# Patient Record
Sex: Female | Born: 1950
Health system: Southern US, Community
[De-identification: ages and names within clinical notes are randomized; demographics above are authoritative.]

## PROBLEM LIST (undated history)

## (undated) DIAGNOSIS — I441 Atrioventricular block, second degree: Secondary | ICD-10-CM

## (undated) DIAGNOSIS — I712 Thoracic aortic aneurysm, without rupture: Secondary | ICD-10-CM

## (undated) DIAGNOSIS — Z91041 Radiographic dye allergy status: Secondary | ICD-10-CM

## (undated) DIAGNOSIS — I771 Stricture of artery: Secondary | ICD-10-CM

## (undated) DIAGNOSIS — I493 Ventricular premature depolarization: Secondary | ICD-10-CM

## (undated) DIAGNOSIS — I255 Ischemic cardiomyopathy: Secondary | ICD-10-CM

## (undated) DIAGNOSIS — I214 Non-ST elevation (NSTEMI) myocardial infarction: Secondary | ICD-10-CM

## (undated) DIAGNOSIS — I491 Atrial premature depolarization: Secondary | ICD-10-CM

## (undated) DIAGNOSIS — M069 Rheumatoid arthritis, unspecified: Secondary | ICD-10-CM

## (undated) DIAGNOSIS — I10 Essential (primary) hypertension: Secondary | ICD-10-CM

## (undated) DIAGNOSIS — I7121 Aneurysm of the ascending aorta, without rupture: Secondary | ICD-10-CM

## (undated) DIAGNOSIS — D869 Sarcoidosis, unspecified: Secondary | ICD-10-CM

## (undated) DIAGNOSIS — D649 Anemia, unspecified: Secondary | ICD-10-CM

## (undated) DIAGNOSIS — Z952 Presence of prosthetic heart valve: Secondary | ICD-10-CM

## (undated) DIAGNOSIS — E785 Hyperlipidemia, unspecified: Secondary | ICD-10-CM

## (undated) DIAGNOSIS — Z951 Presence of aortocoronary bypass graft: Secondary | ICD-10-CM

## (undated) DIAGNOSIS — I2510 Atherosclerotic heart disease of native coronary artery without angina pectoris: Secondary | ICD-10-CM

## (undated) HISTORY — DX: Presence of aortocoronary bypass graft: Z95.1

## (undated) HISTORY — DX: Hyperlipidemia, unspecified: E78.5

## (undated) HISTORY — DX: Sarcoidosis, unspecified: D86.9

## (undated) HISTORY — DX: Anemia, unspecified: D64.9

## (undated) HISTORY — DX: Non-ST elevation (NSTEMI) myocardial infarction: I21.4

## (undated) HISTORY — DX: Stricture of artery: I77.1

## (undated) HISTORY — DX: Rheumatoid arthritis, unspecified: M06.9

## (undated) HISTORY — DX: Presence of prosthetic heart valve: Z95.2

## (undated) HISTORY — DX: Essential (primary) hypertension: I10

---

## 1999-04-01 ENCOUNTER — Ambulatory Visit (HOSPITAL_COMMUNITY): Admission: RE | Admit: 1999-04-01 | Discharge: 1999-04-01 | Payer: Self-pay | Admitting: Family Medicine

## 1999-04-22 ENCOUNTER — Ambulatory Visit: Admission: RE | Admit: 1999-04-22 | Discharge: 1999-04-22 | Payer: Self-pay | Admitting: *Deleted

## 1999-04-23 ENCOUNTER — Emergency Department (HOSPITAL_COMMUNITY): Admission: EM | Admit: 1999-04-23 | Discharge: 1999-04-23 | Payer: Self-pay | Admitting: *Deleted

## 1999-05-13 ENCOUNTER — Encounter: Payer: Self-pay | Admitting: *Deleted

## 1999-05-16 ENCOUNTER — Encounter: Payer: Self-pay | Admitting: *Deleted

## 1999-05-16 ENCOUNTER — Inpatient Hospital Stay (HOSPITAL_COMMUNITY): Admission: RE | Admit: 1999-05-16 | Discharge: 1999-05-19 | Payer: Self-pay | Admitting: *Deleted

## 1999-05-16 HISTORY — PX: SUBCLAVIAN BYPASS GRAFT: SHX1059

## 1999-05-18 ENCOUNTER — Encounter: Payer: Self-pay | Admitting: *Deleted

## 1999-08-03 DIAGNOSIS — Z952 Presence of prosthetic heart valve: Secondary | ICD-10-CM

## 1999-08-03 HISTORY — DX: Presence of prosthetic heart valve: Z95.2

## 1999-08-03 HISTORY — PX: CARDIAC CATHETERIZATION: SHX172

## 1999-08-18 ENCOUNTER — Emergency Department (HOSPITAL_COMMUNITY): Admission: EM | Admit: 1999-08-18 | Discharge: 1999-08-18 | Payer: Self-pay | Admitting: Emergency Medicine

## 1999-08-18 ENCOUNTER — Encounter: Payer: Self-pay | Admitting: Emergency Medicine

## 1999-08-22 ENCOUNTER — Inpatient Hospital Stay (HOSPITAL_COMMUNITY): Admission: AD | Admit: 1999-08-22 | Discharge: 1999-08-29 | Payer: Self-pay | Admitting: Cardiology

## 1999-08-23 ENCOUNTER — Encounter: Payer: Self-pay | Admitting: Cardiothoracic Surgery

## 1999-08-23 HISTORY — PX: AORTIC VALVE REPLACEMENT: SHX41

## 1999-08-24 ENCOUNTER — Encounter: Payer: Self-pay | Admitting: Cardiothoracic Surgery

## 1999-08-25 ENCOUNTER — Encounter: Payer: Self-pay | Admitting: Cardiothoracic Surgery

## 1999-08-26 ENCOUNTER — Encounter: Payer: Self-pay | Admitting: Thoracic Surgery (Cardiothoracic Vascular Surgery)

## 1999-08-27 ENCOUNTER — Encounter: Payer: Self-pay | Admitting: Cardiothoracic Surgery

## 1999-08-28 ENCOUNTER — Encounter: Payer: Self-pay | Admitting: Thoracic Surgery (Cardiothoracic Vascular Surgery)

## 1999-11-04 ENCOUNTER — Encounter: Admission: RE | Admit: 1999-11-04 | Discharge: 1999-11-04 | Payer: Self-pay | Admitting: Cardiothoracic Surgery

## 1999-11-04 ENCOUNTER — Encounter: Payer: Self-pay | Admitting: Cardiothoracic Surgery

## 2000-05-09 ENCOUNTER — Encounter: Payer: Self-pay | Admitting: *Deleted

## 2000-05-10 ENCOUNTER — Inpatient Hospital Stay (HOSPITAL_COMMUNITY): Admission: RE | Admit: 2000-05-10 | Discharge: 2000-05-11 | Payer: Self-pay | Admitting: *Deleted

## 2000-05-10 HISTORY — PX: SUBCLAVIAN ANGIOGRAM: SHX5350

## 2000-06-02 DIAGNOSIS — I771 Stricture of artery: Secondary | ICD-10-CM

## 2000-06-02 HISTORY — DX: Stricture of artery: I77.1

## 2002-04-18 ENCOUNTER — Other Ambulatory Visit: Admission: RE | Admit: 2002-04-18 | Discharge: 2002-04-18 | Payer: Self-pay | Admitting: Obstetrics and Gynecology

## 2004-08-30 ENCOUNTER — Inpatient Hospital Stay (HOSPITAL_COMMUNITY): Admission: EM | Admit: 2004-08-30 | Discharge: 2004-09-02 | Payer: Self-pay | Admitting: Emergency Medicine

## 2005-03-03 ENCOUNTER — Encounter: Admission: RE | Admit: 2005-03-03 | Discharge: 2005-03-03 | Payer: Self-pay | Admitting: Internal Medicine

## 2005-04-14 ENCOUNTER — Other Ambulatory Visit: Admission: RE | Admit: 2005-04-14 | Discharge: 2005-04-14 | Payer: Self-pay | Admitting: Obstetrics and Gynecology

## 2005-07-12 ENCOUNTER — Ambulatory Visit: Payer: Self-pay | Admitting: Critical Care Medicine

## 2005-07-21 ENCOUNTER — Ambulatory Visit (HOSPITAL_COMMUNITY): Admission: RE | Admit: 2005-07-21 | Discharge: 2005-07-21 | Payer: Self-pay | Admitting: Critical Care Medicine

## 2005-08-02 HISTORY — PX: MEDIASTINOSCOPY: SHX5086

## 2005-08-04 ENCOUNTER — Ambulatory Visit: Payer: Self-pay | Admitting: Critical Care Medicine

## 2005-08-10 ENCOUNTER — Ambulatory Visit: Payer: Self-pay | Admitting: Critical Care Medicine

## 2005-08-30 ENCOUNTER — Encounter (INDEPENDENT_AMBULATORY_CARE_PROVIDER_SITE_OTHER): Payer: Self-pay | Admitting: Specialist

## 2005-08-30 ENCOUNTER — Observation Stay (HOSPITAL_COMMUNITY): Admission: RE | Admit: 2005-08-30 | Discharge: 2005-08-31 | Payer: Self-pay | Admitting: Cardiothoracic Surgery

## 2006-03-23 ENCOUNTER — Encounter: Admission: RE | Admit: 2006-03-23 | Discharge: 2006-03-23 | Payer: Self-pay | Admitting: Internal Medicine

## 2007-01-14 ENCOUNTER — Encounter: Admission: RE | Admit: 2007-01-14 | Discharge: 2007-01-14 | Payer: Self-pay | Admitting: Cardiology

## 2007-03-29 ENCOUNTER — Encounter: Admission: RE | Admit: 2007-03-29 | Discharge: 2007-03-29 | Payer: Self-pay | Admitting: Internal Medicine

## 2007-06-14 ENCOUNTER — Ambulatory Visit: Payer: Self-pay | Admitting: Critical Care Medicine

## 2007-07-12 ENCOUNTER — Ambulatory Visit: Payer: Self-pay | Admitting: Critical Care Medicine

## 2007-08-16 ENCOUNTER — Other Ambulatory Visit: Admission: RE | Admit: 2007-08-16 | Discharge: 2007-08-16 | Payer: Self-pay | Admitting: Obstetrics and Gynecology

## 2008-06-05 ENCOUNTER — Encounter: Admission: RE | Admit: 2008-06-05 | Discharge: 2008-06-05 | Payer: Self-pay | Admitting: Internal Medicine

## 2008-07-23 ENCOUNTER — Encounter: Admission: RE | Admit: 2008-07-23 | Discharge: 2008-07-23 | Payer: Self-pay | Admitting: Internal Medicine

## 2009-06-11 ENCOUNTER — Encounter: Admission: RE | Admit: 2009-06-11 | Discharge: 2009-06-11 | Payer: Self-pay | Admitting: Internal Medicine

## 2010-06-17 ENCOUNTER — Encounter: Admission: RE | Admit: 2010-06-17 | Discharge: 2010-06-17 | Payer: Self-pay | Admitting: Internal Medicine

## 2010-06-24 ENCOUNTER — Encounter: Admission: RE | Admit: 2010-06-24 | Discharge: 2010-06-24 | Payer: Self-pay | Admitting: Internal Medicine

## 2010-12-05 ENCOUNTER — Other Ambulatory Visit: Payer: Self-pay | Admitting: Internal Medicine

## 2010-12-05 DIAGNOSIS — Z09 Encounter for follow-up examination after completed treatment for conditions other than malignant neoplasm: Secondary | ICD-10-CM

## 2010-12-16 ENCOUNTER — Ambulatory Visit
Admission: RE | Admit: 2010-12-16 | Discharge: 2010-12-16 | Disposition: A | Discharge status: Home / Self Care | Payer: No Typology Code available for payment source | Source: Ambulatory Visit | Attending: Internal Medicine | Admitting: Internal Medicine

## 2010-12-16 DIAGNOSIS — Z09 Encounter for follow-up examination after completed treatment for conditions other than malignant neoplasm: Secondary | ICD-10-CM

## 2011-02-14 NOTE — Assessment & Plan Note (Signed)
[facility]                             PULMONARY OFFICE NOTE   [name], [name]                     MRN:          [id number]  DATE:06/14/2007                            DOB:          May 15, 1951    [name] returns in followup and is a 60 year old [place] American  female, history of sarcoidosis not seen since 2006.  She had mediastinal  adenopathy, was referred to Dr. Kathlee Nations Trigt, and on August 30, 2005, underwent mediastinoscopy.  Biopsies from this showed noncaseating  granulomas without malignancy.  She did not return to pulmonary for  followup.  In the interim, she has had no new complaints, no cough,  shortness of breath, or chest discomfort.   She maintains:  1. Coumadin daily.  2. Methotrexate 8 pills weekly.  3. Folic acid daily.   The patient denies any current cough, chest congestion, chest  discomfort, or chest pain.  She does maintain the methotrexate due to  chronic rheumatoid arthritis, not for the sarcoid.   PHYSICAL EXAMINATION:  VITAL SIGNS:  Temp was 97, blood pressure 120/80,  pulse 69, saturation 100% room air.  CHEST:  Showed to be clear without evidence of wheeze, rhonchi, or  rales.  CARDIAC:  Showed a regular rate and rhythm without S3.  Normal S1 S2.  ABDOMEN:  Soft, nontender.  EXTREMITIES:  Showed no edema or clubbing.  SKIN:  Clear.  NEUROLOGIC:  Intact.  HEENT:  Showed no jugular venous distention, lymphadenopathy.  Oropharynx clear.  NECK:  Supple.   IMPRESSION:  Sarcoidosis with mediastinal lymph nodes involvement only.  No other systemic involvement.   PLAN:  No systemic therapy at this time for sarcoidosis but expectant  observation.  I will obtain a repeat set of full pulmonary functions.  Would recommend a chest x-ray in 1 year's time.     Charlcie Cradle Delford Field, MD, [facility]  Electronically Signed    PEW/MedQ  DD: 06/14/2007  DT: 06/14/2007  Job #: [id number]   cc:   Kari Baars, M.D.

## 2011-02-17 NOTE — Assessment & Plan Note (Signed)
[facility]                             PULMONARY OFFICE NOTE   NAKIMA, FLUEGGE                     MRN:          [id number]  DATE:07/16/2007                            DOB:          August 21, 1951    Ms. Starkey' pulmonary functions are reviewed in followup, and she has  underlying sarcoidosis.  Pulmonary functions show a diffusion capacity  that is down to 71% of predicted, compared to 89% predicted in 2006;  however, total lung capacity is now up to 4.67, and it was at 84% of  predicted.  It was 4.22 and 76% predicted in 2006.  Spirometry showed  peripheral air flow obstruction.  At this point, we recommend expected  observation and no intervention with corticosteroids.     Charlcie Cradle Delford Field, MD, [facility]  Electronically Signed    PEW/MedQ  DD: 07/16/2007  DT: 07/17/2007  Job #: [id number]   cc:   Kari Baars, M.D.

## 2011-02-17 NOTE — H&P (Signed)
[facility]  Patient:    [name], [name]                     MRN: [id number] Adm. Date:  [id number] Attending:  Melvenia Needles Dictator:   Lissa Merlin, P.A. CC:         Thereasa Solo. Little, M.D.   History and Physical  DATE OF BIRTH:  03/28/1951  REFERRING PHYSICIAN:  Thereasa Solo. Little, M.D.  CHIEF COMPLAINT:  Left subclavian stenosis with steal.  HISTORY OF PRESENT ILLNESS:  This is a 60 year old black female who is status post left common carotid artery subclavian bypass graft in August 2000 by Dr. 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  She has also had a ______ procedure with AVR in November 200 by Dr. Donata Clay. The patient has done well after her surgeries except she has continued to complain of claudication symptoms in her left arm, and has had headaches. Doppler studies now show stenosis just distal to the anastomosis of the graft and her native subclavian artery.  She was seen by Dr. Madilyn Fireman, who now recommends left subclavian PTA in the catheterization lab.  This has been scheduled for May 10, 2000 at [facility].  The patient has been stable and has no new complaints since seeing Dr. Madilyn Fireman.  She contineus with occasional headache and dizziness with occasional paresthesia in her left arm and occasional unsteadiness with walking.  ALLERGIES:  CONTRAST DYE.  PAST MEDICAL HISTORY: 1. History of aortic regurgitation. 2. History of left subclavian steal. 3. Hypertension. 4. Peripheral vascular occlusive disease. 5. Sickle cell trait.  PAST SURGICAL HISTORY: 1. Left carotid to subclavian bypass on May 16, 1999 by Dr. Madilyn Fireman. 2. ______  procedure with #23 St. Jude on August 23, 1999 by Dr. Donata Clay.  FAMILY HISTORY:  Her mother is alive at the age of 59 with hypertension.  Her father is deceased of unknown cause.  SOCIAL HISTORY:  The patient is single with two children alive and  well.  She works a lot.  She works as a IT sales professional at [employer] and also works as a Glass blower/designer, which requires much labor.  She denies alcohol use and tobacco use.  MEDICATIONS:  She has been on chronic Coumadin, which was recently stopped. She was taking alternating doses of 2.5 mg with 5 mg.  She has been on Lovenox for the past several days at home, with home health nurse coming out to administer.  She was given amoxicillin as an antibiotic for dental prophylaxis.  She takes no other medications.  She says "The only think I take is Coumadin."  REVIEW OF SYSTEMS:  She has had problems with a dislocated left shoulder in the past.  She denies diabetes.  She denies asthma or COPD.  She denies stroke.  She denies kidney or liver disease.  She denies blood in her urine or bowels.  PHYSICAL EXAMINATION:  VITAL SIGNS:  Blood pressure 130/68 in the right arm and 100/68 in the left arm, pulse 90 and regular, respirations 18, height 5 feet 7 inches, weight 160 pounds.  HEENT:  Normocephalic and atraumatic.  Pupils equal, round and reactive to light and accommodation.  Extraocular movements intact.  Visual acuity intact OU.  The oropharynx is grossly normal.  NECK:  Supple with no JVD.  There were positive bilateral bruits.  CHEST:  Symmetrical.  Breath sounds are unlabored.  There are no adventitious  breath sounds heard.  CARDIOVASCULAR:  Regular rate and rhythm with a positive valve sound.  There is a positive left subclavian bruit.  ABDOMEN:  Soft and nontender.  Nondistended with positive bowel sounds.  There are no palpable masses.  There are no abdominal bruits heard.  EXTREMITIES:  Warm and well perfused.  There was no clubbing, cyanosis, or edema.  No ulcerations.  PERIPHERAL PULSES:  Carotid pulses are 2+ bilaterally.  Femoral pulses are 2+ bilaterally.  Dorsalis pedis pulses are 2+ bilaterally.  Right upper extremity pulses are noticeably more intense than left upper  extremity pulses.  NEUROLOGIC:  Cranial nerves II-XII are grossly intact.  She has normal affect. She is alert and oriented x 3.  She had a normal gait.  Normal sensation in the lower extremities.  There are no focal deficits on exam.  She is neurologically intact.  Deep tendon reflexes are +1/4 in the lower extremities and +2/4 in the upper extremities.  Muscular strength +5/5 throughout with full range of motion throughout.  ASSESSMENT AND PLAN:  This is a 60 year old black female status post left common carotid to subclavian bypass secondary to stenosis and steal symptoms in August 2000 who now presents with further complaints with left arm pain and light headedness.  She was found to have a stenosis at the anastomosis per Doppler.  She was seen by Dr. Madilyn Fireman, who recommends left subclavian PTA.  This has been scheduled for May 10, 2000 at [facility].  Dr. Madilyn Fireman has reviewed this with the patient.  The risks, benefits, details and alternatives of surgery were discussed and she has agreed to continue.  Because of the patients allergy to contrast dye (which causes itching, hives and swelling but no shortness of breath or dyspnea), she will be premedicated with prednisone and Benadryl.  The patient is currently clinically stable. DD:  05/09/00 TD:  05/10/00 Job: [id number]

## 2011-02-17 NOTE — Procedures (Signed)
[facility]  Patient:    [name], [name]                       MRN: [id number] Proc. Date: 05/10/00 Attending:  Denman George, M.D. CC:         Thereasa Solo. Little, M.D.                           Procedure Report  DIAGNOSES: 1. Left subclavian occlusion. 2. Status post left carotid subclavian bypass with possible graft    stenosis.  PROCEDURES: 1. Retrograde left subclavian arteriogram. 2. Pullback pressure gradient, left carotid subclavian bypass.  COMPLICATIONS:  None apparent.  ACCESS:  Left brachial artery 5 French sheath.  CONTRAST:  Visipaque 15 mL.  CLINICAL NOTE:  This is a 60 year old female who is status post aortic valve replacement, and also has undergone left carotid subclavian bypass for left subclavian occlusion.  Followup evaluation revealed evidence of possible stenosis and a left carotid subclavian bypass graft.  DESCRIPTION OF PROCEDURE:  The patient is brought to the [facility] Catheterization Lab at this time for diagnostic arteriography and possible endovascular intervention.  The patient is brought to the [facility] Catheterization Lab in stable condition, and placed in the supine position.  The left arm was prepped and draped in a sterile fashion.  Administered 2 mg of Versed intravenously.  The skin and subcutaneous tissue of the left antecubital fossa was instilled with 1% Xylocaine.  A needle was easily introduced in the left branchial artery.  A 0.35 Wholey guidewire was passed through the needle and into the subclavian artery under fluoroscope control.  A short 6 French sheath was then advanced over the guidewire.  The dilator removed.  The sheath flushed with heparin and saline solution.  The patient was administered 2000 units of heparin intravenously and 200 mcg of nitroglycerin interarterially.  The Wholey guidewire was then advanced into the left carotid subclavian graft. An end-hole catheter was then  advanced over the [facility] guidewire and positioned in the body of the left carotid subclavian graft.  A retrograde hand injection of contrast was then made through the end-hole catheter.  This revealed patency of a left carotid subclavian bypass graft. There was no obvious stenosis evident in the graft.  Cranial and caudle views were obtained with retrograde hand injection of contrast.  These again, failed to reveal any evidence of significant stenosis in the left subclavian carotid bypass graft.  There was some mild stenosis noted at the proximal anastomosis and some tortuosity of the left common carotid artery noted.  The Wholey guidewire was then reinserted and advanced up to the level of the proximal anastomosis.  The end-hole catheter was advanced over the [facility] guidewire to the proximal anastomosis.  Pullback pressure gradient was then obtained across the bypass graft.  This revealed the 10 mm gradient.  This degree of pressure drop across the bypass was not felt to be significant. Therefore no intervention was undertaken.  This completed the arteriogram procedure.  The guidewire and catheter were removed.  The patient was transferred to the holding area in stable condition.  Planned removal of left brachial sheath.  FINAL IMPRESSION: 1. Patent left carotid subclavian bypass graft. 2. Mild stenosis, left carotid subclavian bypass graft with 10 mm    pressure gradient.  DISPOSITION:  These results have been discussed with the patient.  She will be admitted overnight to the hospital  for reinstitution of Coumadin and Lovenox and plan discharge tomorrow. DD:  05/20/00 TD:  05/20/00 Job: [id number]

## 2011-02-17 NOTE — Discharge Summary (Signed)
[name], [name]              ACCOUNT NO.:  [id number]   MEDICAL RECORD NO.:  [id number]          PATIENT TYPE:  INP   LOCATION:  3040                         FACILITY:  [facility]   PHYSICIAN:  Thereasa Solo. Little, M.D. DATE OF BIRTH:  03-Mar-1951   DATE OF ADMISSION:  08/30/2004  DATE OF DISCHARGE:  09/02/2004                                 DISCHARGE SUMMARY   ADMITTING DIAGNOSES:  1.  Nontherapeutic INR status post St. Jude's aortic valve and dental      procedure.  2.  Normal coronaries.  3.  Peripheral vascular occlusive disease status post left subclavian bypass      graft with 7 mm Dacron August 2000.  4.  Tooth extraction.   BRIEF HISTORY:  The patient is a 60 year old black female who is status post  St. Jude's aortic valve replacement and dental procedure November 2000.  Today she had her pro time checked and it was found to be subtherapeutic.  It was rechecked and again it was still down to 1.4.  The INR for an aortic  valve mechanical should be between 2.5-3.5.  Patient was unable to obtain  Lovenox for financial reasons and she was subsequently admitted and place in  the hospital on IV heparin.   PAST MEDICAL HISTORY:  1.  Rheumatoid arthritis.  2.  Status post St. Jude's AVR replacement/dental procedure November 2000.  3.  Atherosclerotic peripheral vascular disease.  4.  History of left subclavian bypass graft September 2001.  5.  Contact allergies.   CURRENT MEDICATIONS:  1.  Folic acid 1 mg daily.  2.  Methotrexate six pills daily on Saturday.  3.  Coumadin 5 mg daily except for Wednesday when she takes 2.5 mg of      Coumadin.   ALLERGIES:  CONTRAST MEDIA.   For further history and physical please see the note.   HOSPITAL COURSE:  Patient was admitted.  She was placed on heparin pharmacy  protocol.  Her Coumadin had not been interrupted.  She was on amoxicillin  prior to a tooth removal.  She was quickly heparinized.  Her pro time was  monitored daily.   She reached therapeutic range, but had some bleeding from  her tooth extraction site.  By September 02, 2004 patient's hemoglobin was  12.5, hematocrit 38, white count 3.9, platelets 238,000.  Pro time was 23.9  with an INR of 2.9.  Her heparin had been discontinued on September 01, 2004  at which time her INR was 3.4.  Her heparin level was 1.7 and she had some  bleeding from the tooth extraction site.  She was subsequently discharged  home in the a.m. of September 02, 2004 on her preadmission medicines with no  changes.  She will have a  pro time checked this coming Monday, September 05, 2004 and again on September 09, 2004.  Patient did admit to having a large number of greens for  Thanksgiving.   ACTIVITY:  Light to moderate.  She can resume her normal activity at any  time.       WDJ/MEDQ  D:  09/02/2004  T:  09/03/2004  Job:  [id number]   cc:   Jonny Ruiz, M.D.

## 2011-02-17 NOTE — Discharge Summary (Signed)
[facility]  Patient:    [name], [name]                     MRN: [id number] Adm. Date:  [id number] Disc. Date: [id number] Attending:  Melvenia Needles Dictator:   Maple Mirza, P.A.                           Discharge Summary  DATE OF BIRTH:  1951/05/05  FINAL DIAGNOSIS:  Recurrent stenosis of left carotid subclavian bypass at the site of the subclavian anastomosis.  She has symptoms of lightheadedness and unsteadiness of gait.  PROCEDURE:  Left subclavian arteriogram, pullback pressure gradient left subclavian without apparent complications for left subclavian stenosis.  The patient tolerated the procedure well.  She was on Coumadin for  HOSPITAL COURSE:  Ms. Sloniker is status post Bentall procedure with placement of St. Jude valve conduit and coronary implantation which was done August 23, 1999. She is also status post left carotid subclavian bypass May 16, 1999.  She had been arranged to be on Lovenox replacement while Coumadin was held prior to this procedure. She will continue with Lovenox injected twice daily for 5 days while her Coumadin achieves an anticoagulation level acceptable for prosthetic valve.  Home health will assist in Lovenox injections twice daily.  She will have blood taken for a PT/INR on Monday, August 13, by [facility].  The results will go to Dr. Suzanne Boron office, and he will inform Ms. Arora of the dosage of Coumadin.  Her regular Coumadin dosage is 5 mg alternating with 2.5 mg every other day.  She will also go home on Tylenol 325 mg 2 tablets every 4 to 6 hours p.r.n. pain.  She does have some pain in the left antecubital area where the brachial artery was entered for angioplasty today.  DISCHARGE DISPOSITION:  She goes home August 10 with the medication regimen and followup with Dr. Madilyn Fireman in two weeks to discuss the results of the procedure.  Appointment will be made for her. DD:   05/10/00 TD:  05/12/00 Job: [id number]

## 2011-02-17 NOTE — Letter (Signed)
July 16, 2007    Ms. [name]  [address]  [place], [place] [address]   RE:  LATESA, FRATTO  MRN:  [id number]  /  DOB:  August 01, 1951   Dear Ms. Mckenney:   This is a letter regarding your pulmonary function studies done on  July 12, 2007.  This reveals stable lung disease, and in fact there  is an improvement in your lung capacity, compared to previous values in  2006.   At this point we do not recommend further therapy for your sarcoidosis,  but rather expectant observation.  If you have questions, do not  hesitate to contact me.    Sincerely,      Charlcie Cradle. Delford Field, MD, [facility]  Electronically Signed    PEW/MedQ  DD: 07/16/2007  DT: 07/17/2007  Job #: [id number]

## 2011-02-17 NOTE — Op Note (Signed)
[place]. [facility]  Patient:    [name]                      MRN: [id number] Proc. Date: 08/23/99 Adm. Date:  [id number] Attending:  Mikey Bussing CC:         Thereasa Solo. Little, M.D.                           Operative Report  OPERATION:  Bentall procedure with aortic root replacement with a 23.0 mm St. Jude valve conduit, and reimplantation of the coronaries.  PREOPERATIVE DIAGNOSIS:  Severe aortic regurgitation with dilated aortic root and left ventricular dysfunction.  POSTOPERATIVE DIAGNOSIS:  Severe aortic regurgitation with dilated aortic root nd left ventricular dysfunction.  SURGEON:  Mikey Bussing, M.D.  ASSISTANT:  Sherrie George, P.A.  ANESTHESIA:  General by Dr. Janetta Hora. [place].  INDICATIONS:  The patient is a 60 year old black female who had been followed for aortic regurgitation.  This became increasing symptomatic with episode of syncope, orthopnea, and increasing dyspnea on exertion.  She underwent a re-echo and a cardiac catheterization which demonstrated severe left ventricular dilatation, severe aortic root regurgitation, and a dilated aortic root of 5.0 cm.  She is referred for an aortic valve replacement and an aortic root replacement.  Her coronaries had no significant disease, and her mitral valve was without significant regurgitation.  Prior to the operation, the patient was examined in her hospital room after a cardiac catheterization, and the results of her cardiac catheterization and echocardiogram were discussed with the patient.  The indications and expected benefits of AVR, and root replacement were discussed. I discussed the details of the operation, including the placement of the surgical  incision, the use of cardiopulmonary bypass, the choice of mechanical St. Jude aortic valve, and the commitment to permanent anticoagulation with Coumadin, the use of general anesthesia, and the  expected postoperative recovery.  I discussed the specific risks of the operation, including the risks of myocardial infarction, CVA, bleeding, infection, and death.  She understood the implications of surgery. She understood the alternatives to surgery, and she understood the risks of the  operation.  All questions were answered, and she agreed to proceed with the operation under an informed consent.  FINDINGS:  The ascending aorta was definitely enlarged and required replacement. The ascending aorta was enlarged diffusely, without a discrete aneurysm.  The decision was made to perform the reconstruction under a crossclamp, and not use  circulation arrest, and not to sew to the aortic arch.  The ascending aorta was  replaced to just below the subclavian vein.  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  DESCRIPTION OF PROCEDURE:  The patient was brought to the operating room and placed supine on the operating room table, where general anesthesia was induced under invasive hemodynamic monitoring.  The chest, abdomen, and legs were prepped with Betadine and draped as a sterile field.  A median sternotomy was performed, and  heparin was administered, and the ACT was documented to be therapeutic.  The sternal retractor was placed.  The pericardium was opened.  Pursestrings were placed in the ascending aorta and right atrium.  The patient was cannulated and  placed on cardiopulmonary bypass, and cooled to 32 degrees.  Cannulas were placed for antegrade and retrograde delivery of cold blood cardioplegia, and the aorta was dissected from  the pulmonary artery.  A left ventricular vent was placed via the right superior pulmonary vein.  The patient was cooled to 28 degrees, and as the aortic crossclamp was applied, retrograde cardioplegia was delivered to result n immediate cardioplegic arrest, and the septal temperature dropping to less  than 12 degrees.  Topical iced saline slush was used to augment myocardial preservation, and the pericardial insulator pad was used to protect the left phrenic nerve.  The ascending aorta was opened.  It was significantly diseased.  The aortic valve was inspected and had three leaflets which were degenerative and insufficient.  These were excised and sent for pathology and culture.  The aortic annulus was sized to a 23 mm St. Jude valve.  Subannular #2-0 pledgeted sutures were placed  around the annulus.  The coronary ostia were examined, and the left main was large, approximately 3 mm in diameter, and the right coronary ostia was smaller, approximately 2 mm in diameter.  The St. Jude valve conduit (23 mm valve) was then placed in the field, and the subannular sutures were placed through the sewing im of the valve conduit, and the valve conduit was seated and the sutures were tied. The valve was inspected inside the conduit, and the leaflets opened and closed without impingement.  Cardioplegia was then redosed.  Next, the coronary buttons were sewn onto the woven graft using a cautery to burn the appropriate-sized hole in the graft at the appropriate location, and running #5-0 Prolene to perform the side-to-side anastomosis between the coronary ostium and the graft.  The left main was sewn first, followed by the right coronary button.  Cardioplegia was then redosed, and the anastomoses were checked inside the graft and found to be widely patent.  The ascending graft was then trimmed and beveled to minimize the mismatch between the large size of the native aorta, and the smaller size of the Hemashield graft.  It was then sewn in an end-to-end fashion with running #3-0 Prolene, to excise the majority of the ascending aorta. Prior to tying the suture on the suture line, air was vented from the heart with a dose of warm retrograde blood cardioplegia, and the usual deairing  maneuvers with leading volume in from the bypass circuit and gently ventilating the lungs. The crossclamp was then removed, and a second vent was placed in the ascending aorta to remove any residual air.   The heart was cardioverted back to a regular rhythm.  The patient was rewarmed nd reperfused.  Temporary pacing wires were applied.  When the patient reached 37 degrees, both cannulas for cardioplegia were removed and the LV vent was removed. The patient was weaned from cardiopulmonary bypass without inotropics, with a stable blood pressure and hemodynamics.  _______ was administered.  The mediastinum was irrigated with warm antibiotic irrigation, and hemostasis was adequate.  The aortic annulus was surrounded with thrombin-soaked Gelfoam to provide adequate hemostasis.  The pericardium was loosely approximated over the  aorta and the right ventricle.  Two mediastinal chest tubes were placed and brought out through separate incisions.  The patient remained stable.  The sternum was approximated with eight sternal wires, and the pectoralis fascia and subcutaneous layers were closed with a running Vicryl.  The skin was closed with a subcuticular, and sterile dressing were applied.  The total cardiopulmonary bypass time was 170 minutes, with the aortic crossclamp time of 120 minutes. DD:  08/23/99 TD:  08/25/99 Job: [id number]

## 2011-02-17 NOTE — Consult Note (Signed)
[place]. [facility]  Patient:    [name]                      MRN: [id number] Proc. Date: 08/22/99 Adm. Date:  [id number] Attending:  Loreli Dollar CC:         Thereasa Solo. Little, M.D.             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  HISTORY OF PRESENT ILLNESS:  The patient is a 60 year old black female with known history of aortic valvular regurgitation, followed by Thereasa Solo. Little, M.D. with serial 2-D echocardiogram.  Patient was at work this past week and developed significant dyspnea on exertion and chest pain, with history of three-pillow orthopnea and dyspnea on exertion with minimal activity.  The patient was evaluated and a 2-D echocardiogram at that time showed an increase in her aortic valvular  regurgitation from the prior study in July of this year.  It was then felt to be moderate and now was felt to be moderate to severe.  Patient was treated medically and arrangements were made for the patient to undergo cardiac catheterization and evaluation for aortic valve replacement.  Her prior 2-D echocardiogram showed no evidence of significant mitral regurgitation or significant pulmonary hypertension and there was minimal tricuspid regurgitation.  Her overall global left ventricular function was preserved.  The patient underwent cardiac catheterization today which demonstrated PA pressures of 25/15 with cardiac output of 4.8 l/min.  Her ejection fraction was 60% and her LVEDP was 23.  She had minimal mitral regurgitation and severe aortic regurgitation with a dilated ascending aorta, approximately 4.0 to 4.5 cm in diameter.  The coronary arteriograms showed no significant coronary artery disease.  She was referred for aortic valve replacement and possible aortic root replacement with a Bentall  procedure.  PAST MEDICAL HISTORY:  The patient has been followed with serial echocardiograms for known aortic regurgitation and has a known history of heart murmur.  She takes antibiotic prophylaxis prior to going to the dentist.  She has a history of mild hypertension. She underwent left subclavian-left carotid bypass in August of this year by P. Bud Face, M.D. for left subclavian steal syndrome; she tolerated the procedure well.  She otherwise is in good health and has no specific primary care physician and [facility] with Fayne Norrie, M.D.  Her surgical history is positive for dislocated shoulder, not requiring surgery, and she also has sickle cell trait.  MEDICATIONS:  She is on no routine medications.  ALLERGIES:  She is allergic to IV CONTRAST but not other known drugs.  SOCIAL HISTORY:  Negative for tobacco or alcohol use.  She is single and has a on in college and her mother lives in town.  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  FAMILY HISTORY:  Positive for hypertension and sickle cell trait.  REVIEW OF SYSTEMS:  She denies any change in weight, fever, night sweat or change in bowel habits.  She denies any syncope, seizure or CVA.  She denies any visual change.  She denies any active dental problems.  There is no history of blood per rectum, jaundice or recent abdominal pain.  There is  no history of wheezing, productive cough or hemoptysis.  She denies any polyuria, hematuria or history f kidney stone.  There is no history of DVT or claudication.  She denies any unusual bleeding tendency or easy bruisibility.  She denies any skin lesion or rash and she denies depression, decreased appetite or insomnia.  There is no clear history of rheumatic fever as a child.  PHYSICAL EXAMINATION:  VITAL SIGNS:  She is 5 feet 7 inches and weighs 160 pounds.  Blood pressure is 150/50.  Pulse is 80 and regular.   Respirations 20 and labored.  Temperature 98.6.  GENERAL APPEARANCE:  A young black female, very pleasant, in no acute distress,  sitting with the head of the bed elevated 30 degrees.  HEENT:  Normocephalic.  Normal EOMs.  Normal facies.  Dentition under good repair. Clear pharynx.  NECK:  Supple without carotid bruit, thyromegaly or mass.  LUNGS:  Clear breath sounds bilaterally.  CHEST:  There are no thoracic deformities.  CARDIAC:  A 3/6 diastolic murmur with an S3 gallop present.  She is in sinus rhythm.  Her PMI is displaced laterally.  ABDOMEN:  Soft, nontender; without mass, organomegaly or tenderness.  EXTREMITIES:  Pedal pulses 2+ bilaterally and no evidence of venous insufficiency. She has diminished blood pressure on the left arm compared to the right arm and has an abnormal Allen test in both upper extremities.  She has full range of motion of her upper and lower extremities and no evidence of swollen, tender joints.  NEUROLOGIC:  Alert and oriented x 3 with full motor function  SKIN:  Her skin is without rash or lesion.  LABORATORY DATA:  Vascular lab Doppler exam reveals no evidence of carotid disease on either side.  Blood pressure in the right brachial was 136; blood pressure in the left brachial was 80.  IMPRESSION AND PLAN:  I reviewed her cardiac catheterization and coronary arteriograms by Dr. Clarene Duke.  I reviewed the recent chest wall 2-D by Dr. Clarene Duke  demonstrating moderate-to-severe aortic regurgitation.  I agree with Dr. Darrol Poke recommendation that she undergo aortic valve replacement for preservation of ventricular function due to her severe aortic regurgitation and now active symptoms.  She does have a dilated ascending aorta and if it is close to 5 cm in diameter, she will need aortic root replacement with a Bentall procedure. We plan on using a St. Jude valve conduit if that is necessary.  Her coronaries show no significant disease and  no grafts are planned.  I discussed the planned procedure with the patient including the indications for  aortic valve replacement-Bentall, expected benefits, alternative therapy as well as the details of the procedure.  I discussed the use of general anesthesia and cardiopulmonary bypass, the placement of the surgical incisions and expected postoperative recovery.  I reviewed the potential risks of the operation with the patient including those risks of myocardial infarction, cerebrovascular accident, bleeding, infection and death.  She understands these implications for surgery.  She understands the alternatives to surgery.  She agrees to proceed with the operating table as planned under informed consent.  DD:  08/22/99 TD:  08/23/99 Job: [id number]

## 2011-02-17 NOTE — Op Note (Signed)
[name], [name]              ACCOUNT NO.:  [id number]   MEDICAL RECORD NO.:  [id number]          PATIENT TYPE:  OBV   LOCATION:  2013                         FACILITY:  [facility]   PHYSICIAN:  Kerin Perna, M.D.  DATE OF BIRTH:  1951/09/12   DATE OF PROCEDURE:  08/30/2005  DATE OF DISCHARGE:                                 OPERATIVE REPORT   OPERATION:  Bronchoscopy and mediastinoscopy.   PREOPERATIVE DIAGNOSIS:  Mediastinal adenopathy with borderline increased  uptake and PET scan.   POSTOPERATIVE DIAGNOSIS:  Mediastinal adenopathy with borderline increased  uptake and PET scan.   SURGEON:  Kerin Perna, M.D.   ANESTHESIA:  General.   INDICATIONS:  The patient is a 60 year old black female with rheumatoid,  status post resection and grafting of the ascending aorta with a Bentall  procedure using a valve conduit, on chronic Coumadin therapy, who was found  to have mediastinal adenopathy by CT scan.  A subsequent PET scan showed  borderline increased metabolic activity of the lymph nodes.  She also has  dilated aortic arch of approximately 4.5 cm.  She has no symptoms of fever,  weight loss or  night sweats.  Her ACE inhibitor level was normal.  Because  of concern over lymphoma or malignancy, a mediastinal biopsy was recommended  by her pulmonologist.   Prior to surgery, I examined the patient in the office and reviewed the  results of her x-ray studies with the patient.  I discussed the indications  and rationale for bronchoscopy and mediastinoscopy.  I discussed the details  of the procedure including the use of general anesthesia, location of the  surgical incision, and the expected postoperative hospital recovery.  She  understood the associated risk of bleeding, infection, pneumothorax, and  death.  We stopped her Coumadin 4 days prior to this operation.  The patient  understood all the aspects of this operation and agree to proceed under what  I felt was an  informed consent.   PROCEDURE:  The patient was brought to the operating room and supine on the  operating table where general anesthesia was induced.  Through the  endotracheal tube, a fiberoptic flexible bronchoscope was placed.  The  carina was sharp and normal.  The bronchoscope was passed into the right  mainstem bronchus, which was normal.  The segmental bronchi of the right  upper lobe, right middle lobe and right lower lobes were examined and  washings were taken of the right lung and submitted for cytology.  The  bronchoscope was then withdrawn to the left mainstem bronchus.  This was  normal without endobronchial lesions.  The segmental bronchi of the left  upper lobe and left lower lobe were examined and there were no endobronchial  lesions or evidence of infection.  Washings were taken and submitted for  cytology as well.  The bronchoscope was then removed.   The patient was then prepped and draped from the neck to the umbilicus.  A  small transverse incision was made in the suprasternal notch measuring 2 cm.  Sharp and electrocautery dissection was taken down  to the pretracheal plane.  The pretracheal plane was developed under the innominate vessels and the  upper and the mid-anterior mediastinum were examined.  There was a soft,  fleshy lymph node at the 4R station, which was dissected out and biopsied.  There was some pretracheal fibrous areolar tissue which was also resected  and submitted for section.  The  bronchoscope was passed as far distally as possible and there were no other  areas of adenopathy noted.  Bleeding was controlled, which was minimal, with  electrocautery.  The incision was closed in layers using Vicryl and the  patient was reversed from anesthesia and return to the recovery room in  stable condition.      Kerin Perna, M.D.  Electronically Signed     PV/MEDQ  D:  08/30/2005  T:  08/31/2005  Job:  [id number]   cc:   CVTS   Shan Levans,  M.D. [facility]  [address]  [place]  [place] [address]

## 2011-07-10 ENCOUNTER — Other Ambulatory Visit: Payer: Self-pay | Admitting: Internal Medicine

## 2011-07-10 DIAGNOSIS — Z1231 Encounter for screening mammogram for malignant neoplasm of breast: Secondary | ICD-10-CM

## 2011-07-27 ENCOUNTER — Ambulatory Visit
Admission: RE | Admit: 2011-07-27 | Discharge: 2011-07-27 | Disposition: A | Discharge status: Home / Self Care | Payer: PRIVATE HEALTH INSURANCE | Source: Ambulatory Visit | Attending: Internal Medicine | Admitting: Internal Medicine

## 2011-07-27 DIAGNOSIS — Z1231 Encounter for screening mammogram for malignant neoplasm of breast: Secondary | ICD-10-CM

## 2012-01-31 HISTORY — PX: NM MYOCAR PERF WALL MOTION: HXRAD629

## 2012-09-17 ENCOUNTER — Other Ambulatory Visit: Payer: Self-pay | Admitting: Internal Medicine

## 2012-09-17 DIAGNOSIS — Z1231 Encounter for screening mammogram for malignant neoplasm of breast: Secondary | ICD-10-CM

## 2012-10-18 ENCOUNTER — Ambulatory Visit
Admission: RE | Admit: 2012-10-18 | Discharge: 2012-10-18 | Disposition: A | Discharge status: Home / Self Care | Payer: PRIVATE HEALTH INSURANCE | Source: Ambulatory Visit | Attending: Internal Medicine | Admitting: Internal Medicine

## 2012-10-18 DIAGNOSIS — Z1231 Encounter for screening mammogram for malignant neoplasm of breast: Secondary | ICD-10-CM

## 2012-10-21 ENCOUNTER — Encounter (HOSPITAL_COMMUNITY): Payer: Self-pay | Admitting: Pharmacy Technician

## 2012-10-22 ENCOUNTER — Other Ambulatory Visit: Payer: Self-pay | Admitting: *Deleted

## 2012-11-02 DIAGNOSIS — Z951 Presence of aortocoronary bypass graft: Secondary | ICD-10-CM

## 2012-11-02 HISTORY — DX: Presence of aortocoronary bypass graft: Z95.1

## 2012-11-04 ENCOUNTER — Inpatient Hospital Stay (HOSPITAL_COMMUNITY)
Admission: RE | Admit: 2012-11-04 | Discharge: 2012-11-26 | DRG: 217 | Disposition: A | Discharge status: Home / Self Care | Payer: PRIVATE HEALTH INSURANCE | Source: Ambulatory Visit | Attending: Cardiothoracic Surgery | Admitting: Cardiothoracic Surgery

## 2012-11-04 ENCOUNTER — Inpatient Hospital Stay (HOSPITAL_COMMUNITY): Payer: PRIVATE HEALTH INSURANCE

## 2012-11-04 ENCOUNTER — Encounter (HOSPITAL_COMMUNITY): Payer: Self-pay | Admitting: Anesthesiology

## 2012-11-04 ENCOUNTER — Encounter (HOSPITAL_COMMUNITY)
Admission: RE | Disposition: A | Discharge status: Home / Self Care | Payer: Self-pay | Source: Ambulatory Visit | Attending: Cardiothoracic Surgery

## 2012-11-04 ENCOUNTER — Encounter (HOSPITAL_COMMUNITY): Payer: Self-pay | Admitting: *Deleted

## 2012-11-04 ENCOUNTER — Other Ambulatory Visit: Payer: Self-pay

## 2012-11-04 DIAGNOSIS — I739 Peripheral vascular disease, unspecified: Secondary | ICD-10-CM | POA: Diagnosis present

## 2012-11-04 DIAGNOSIS — I77810 Thoracic aortic ectasia: Secondary | ICD-10-CM | POA: Diagnosis present

## 2012-11-04 DIAGNOSIS — I712 Thoracic aortic aneurysm, without rupture, unspecified: Secondary | ICD-10-CM | POA: Diagnosis present

## 2012-11-04 DIAGNOSIS — I2510 Atherosclerotic heart disease of native coronary artery without angina pectoris: Secondary | ICD-10-CM

## 2012-11-04 DIAGNOSIS — IMO0002 Reserved for concepts with insufficient information to code with codable children: Secondary | ICD-10-CM | POA: Diagnosis not present

## 2012-11-04 DIAGNOSIS — Z01810 Encounter for preprocedural cardiovascular examination: Secondary | ICD-10-CM

## 2012-11-04 DIAGNOSIS — J90 Pleural effusion, not elsewhere classified: Secondary | ICD-10-CM | POA: Diagnosis not present

## 2012-11-04 DIAGNOSIS — E8779 Other fluid overload: Secondary | ICD-10-CM | POA: Diagnosis not present

## 2012-11-04 DIAGNOSIS — Z7901 Long term (current) use of anticoagulants: Secondary | ICD-10-CM

## 2012-11-04 DIAGNOSIS — I200 Unstable angina: Secondary | ICD-10-CM | POA: Diagnosis present

## 2012-11-04 DIAGNOSIS — J9819 Other pulmonary collapse: Secondary | ICD-10-CM | POA: Diagnosis not present

## 2012-11-04 DIAGNOSIS — D696 Thrombocytopenia, unspecified: Secondary | ICD-10-CM | POA: Diagnosis not present

## 2012-11-04 DIAGNOSIS — B961 Klebsiella pneumoniae [K. pneumoniae] as the cause of diseases classified elsewhere: Secondary | ICD-10-CM | POA: Diagnosis not present

## 2012-11-04 DIAGNOSIS — I10 Essential (primary) hypertension: Secondary | ICD-10-CM | POA: Diagnosis present

## 2012-11-04 DIAGNOSIS — R5381 Other malaise: Secondary | ICD-10-CM | POA: Diagnosis not present

## 2012-11-04 DIAGNOSIS — Z79899 Other long term (current) drug therapy: Secondary | ICD-10-CM

## 2012-11-04 DIAGNOSIS — Z91041 Radiographic dye allergy status: Secondary | ICD-10-CM | POA: Diagnosis present

## 2012-11-04 DIAGNOSIS — Z952 Presence of prosthetic heart valve: Secondary | ICD-10-CM

## 2012-11-04 DIAGNOSIS — E876 Hypokalemia: Secondary | ICD-10-CM | POA: Diagnosis not present

## 2012-11-04 DIAGNOSIS — N390 Urinary tract infection, site not specified: Secondary | ICD-10-CM | POA: Diagnosis not present

## 2012-11-04 DIAGNOSIS — I318 Other specified diseases of pericardium: Secondary | ICD-10-CM | POA: Diagnosis not present

## 2012-11-04 DIAGNOSIS — Y832 Surgical operation with anastomosis, bypass or graft as the cause of abnormal reaction of the patient, or of later complication, without mention of misadventure at the time of the procedure: Secondary | ICD-10-CM | POA: Diagnosis not present

## 2012-11-04 DIAGNOSIS — Z954 Presence of other heart-valve replacement: Secondary | ICD-10-CM

## 2012-11-04 DIAGNOSIS — D500 Iron deficiency anemia secondary to blood loss (chronic): Secondary | ICD-10-CM | POA: Diagnosis not present

## 2012-11-04 DIAGNOSIS — Z951 Presence of aortocoronary bypass graft: Secondary | ICD-10-CM

## 2012-11-04 DIAGNOSIS — Z7982 Long term (current) use of aspirin: Secondary | ICD-10-CM

## 2012-11-04 DIAGNOSIS — I4891 Unspecified atrial fibrillation: Secondary | ICD-10-CM | POA: Diagnosis not present

## 2012-11-04 DIAGNOSIS — D869 Sarcoidosis, unspecified: Secondary | ICD-10-CM | POA: Diagnosis present

## 2012-11-04 DIAGNOSIS — I309 Acute pericarditis, unspecified: Secondary | ICD-10-CM | POA: Diagnosis not present

## 2012-11-04 HISTORY — PX: LEFT HEART CATHETERIZATION WITH CORONARY ANGIOGRAM: SHX5451

## 2012-11-04 HISTORY — DX: Atherosclerotic heart disease of native coronary artery without angina pectoris: I25.10

## 2012-11-04 LAB — URINALYSIS, ROUTINE W REFLEX MICROSCOPIC
Bilirubin Urine: NEGATIVE
Glucose, UA: NEGATIVE mg/dL
Hgb urine dipstick: NEGATIVE
Ketones, ur: NEGATIVE mg/dL
Leukocytes, UA: NEGATIVE
Nitrite: NEGATIVE
Protein, ur: NEGATIVE mg/dL
Specific Gravity, Urine: 1.01 (ref 1.005–1.030)
Urobilinogen, UA: 0.2 mg/dL (ref 0.0–1.0)
pH: 5 (ref 5.0–8.0)

## 2012-11-04 LAB — BASIC METABOLIC PANEL
BUN: 10 mg/dL (ref 6–23)
CO2: 26 mEq/L (ref 19–32)
Chloride: 105 mEq/L (ref 96–112)
Glucose, Bld: 93 mg/dL (ref 70–99)
Potassium: 3.7 mEq/L (ref 3.5–5.1)

## 2012-11-04 LAB — HEMOGLOBIN A1C
Hgb A1c MFr Bld: 6.1 % — ABNORMAL HIGH (ref <5.7)
Mean Plasma Glucose: 128 mg/dL — ABNORMAL HIGH (ref <117)

## 2012-11-04 LAB — CBC
HCT: 42.2 % (ref 36.0–46.0)
Hemoglobin: 14 g/dL (ref 12.0–15.0)
MCH: 26.3 pg (ref 26.0–34.0)
MCHC: 33.2 g/dL (ref 30.0–36.0)
RBC: 5.32 MIL/uL — ABNORMAL HIGH (ref 3.87–5.11)

## 2012-11-04 LAB — PREPARE RBC (CROSSMATCH)

## 2012-11-04 LAB — SURGICAL PCR SCREEN
MRSA, PCR: NEGATIVE
Staphylococcus aureus: NEGATIVE

## 2012-11-04 SURGERY — LEFT HEART CATHETERIZATION WITH CORONARY ANGIOGRAM
Anesthesia: LOCAL

## 2012-11-04 MED ORDER — ATORVASTATIN CALCIUM 40 MG PO TABS
40.0000 mg | ORAL_TABLET | Freq: Every day | ORAL | Status: DC
  Administered 2012-11-04: 40 mg via ORAL
  Filled 2012-11-04 (×2): qty 1

## 2012-11-04 MED ORDER — VANCOMYCIN HCL 10 G IV SOLR
1250.0000 mg | INTRAVENOUS | Status: AC
  Administered 2012-11-05: 1250 mg via INTRAVENOUS
  Filled 2012-11-04 (×2): qty 1250

## 2012-11-04 MED ORDER — DIAZEPAM 5 MG PO TABS: 5.0000 mg | ORAL_TABLET | ORAL | Status: DC | PRN

## 2012-11-04 MED ORDER — SODIUM CHLORIDE 0.9 % IV SOLN: INTRAVENOUS | Status: AC

## 2012-11-04 MED ORDER — LIDOCAINE HCL (PF) 1 % IJ SOLN
INTRAMUSCULAR | Status: AC
  Filled 2012-11-04: qty 30

## 2012-11-04 MED ORDER — MIDAZOLAM HCL 2 MG/2ML IJ SOLN
INTRAMUSCULAR | Status: AC
  Filled 2012-11-04: qty 2

## 2012-11-04 MED ORDER — ISOSORBIDE MONONITRATE 15 MG HALF TABLET
15.0000 mg | ORAL_TABLET | Freq: Every day | ORAL | Status: DC
  Filled 2012-11-04: qty 1

## 2012-11-04 MED ORDER — HEPARIN (PORCINE) IN NACL 100-0.45 UNIT/ML-% IJ SOLN
900.0000 [IU]/h | INTRAMUSCULAR | Status: DC
  Filled 2012-11-04 (×2): qty 250

## 2012-11-04 MED ORDER — METOPROLOL TARTRATE 12.5 MG HALF TABLET
12.5000 mg | ORAL_TABLET | Freq: Once | ORAL | Status: AC
  Administered 2012-11-05: 12.5 mg via ORAL
  Filled 2012-11-04: qty 1

## 2012-11-04 MED ORDER — SODIUM CHLORIDE 0.9 % IV SOLN
INTRAVENOUS | Status: AC
  Administered 2012-11-05: 1 [IU]/h via INTRAVENOUS
  Filled 2012-11-04 (×2): qty 1

## 2012-11-04 MED ORDER — AMLODIPINE BESYLATE 5 MG PO TABS
5.0000 mg | ORAL_TABLET | Freq: Every day | ORAL | Status: DC
  Administered 2012-11-04: 5 mg via ORAL
  Filled 2012-11-04 (×2): qty 1

## 2012-11-04 MED ORDER — HEPARIN (PORCINE) IN NACL 2-0.9 UNIT/ML-% IJ SOLN
INTRAMUSCULAR | Status: DC
  Administered 2012-11-04: 21:00:00 via INTRAVENOUS

## 2012-11-04 MED ORDER — SODIUM CHLORIDE 0.9 % IJ SOLN: 3.0000 mL | INTRAMUSCULAR | Status: DC | PRN

## 2012-11-04 MED ORDER — ACETAMINOPHEN 325 MG PO TABS
650.0000 mg | ORAL_TABLET | ORAL | Status: DC | PRN
  Administered 2012-11-04: 650 mg via ORAL
  Filled 2012-11-04: qty 2

## 2012-11-04 MED ORDER — ONDANSETRON HCL 4 MG/2ML IJ SOLN: 4.0000 mg | Freq: Four times a day (QID) | INTRAMUSCULAR | Status: DC | PRN

## 2012-11-04 MED ORDER — TEMAZEPAM 15 MG PO CAPS: 15.0000 mg | ORAL_CAPSULE | Freq: Once | ORAL | Status: AC | PRN

## 2012-11-04 MED ORDER — SODIUM CHLORIDE 0.9 % IV SOLN
INTRAVENOUS | Status: DC
  Administered 2012-11-04: 07:00:00 via INTRAVENOUS

## 2012-11-04 MED ORDER — PANTOPRAZOLE SODIUM 40 MG PO TBEC
40.0000 mg | DELAYED_RELEASE_TABLET | Freq: Every day | ORAL | Status: DC
  Administered 2012-11-04: 40 mg via ORAL
  Filled 2012-11-04: qty 1

## 2012-11-04 MED ORDER — ISOSORBIDE MONONITRATE 15 MG HALF TABLET
15.0000 mg | ORAL_TABLET | Freq: Once | ORAL | Status: AC
  Administered 2012-11-04: 15 mg via ORAL
  Filled 2012-11-04: qty 1

## 2012-11-04 MED ORDER — ISOSORBIDE MONONITRATE ER 30 MG PO TB24
30.0000 mg | ORAL_TABLET | Freq: Every day | ORAL | Status: DC
  Filled 2012-11-04: qty 1

## 2012-11-04 MED ORDER — IOHEXOL 350 MG/ML SOLN
60.0000 mL | Freq: Once | INTRAVENOUS | Status: AC | PRN
  Administered 2012-11-04: 60 mL via INTRAVENOUS

## 2012-11-04 MED ORDER — OMEGA-3-ACID ETHYL ESTERS 1 G PO CAPS
1.0000 g | ORAL_CAPSULE | Freq: Every day | ORAL | Status: DC
  Administered 2012-11-04: 1 g via ORAL
  Filled 2012-11-04 (×2): qty 1

## 2012-11-04 MED ORDER — MAGNESIUM SULFATE 50 % IJ SOLN
40.0000 meq | INTRAMUSCULAR | Status: DC
  Filled 2012-11-04 (×2): qty 10

## 2012-11-04 MED ORDER — SODIUM CHLORIDE 0.9 % IV SOLN
INTRAVENOUS | Status: AC
  Administered 2012-11-05: 77 mL/h via INTRAVENOUS
  Filled 2012-11-04 (×2): qty 40

## 2012-11-04 MED ORDER — NITROGLYCERIN 1 MG/10 ML FOR IR/CATH LAB
INTRA_ARTERIAL | Status: AC
  Filled 2012-11-04: qty 10

## 2012-11-04 MED ORDER — POTASSIUM CHLORIDE 2 MEQ/ML IV SOLN
80.0000 meq | INTRAVENOUS | Status: DC
  Filled 2012-11-04: qty 40

## 2012-11-04 MED ORDER — METHYLPREDNISOLONE SODIUM SUCC 125 MG IJ SOLR
125.0000 mg | Freq: Once | INTRAMUSCULAR | Status: AC
  Administered 2012-11-04: 125 mg via INTRAVENOUS
  Filled 2012-11-04 (×2): qty 2

## 2012-11-04 MED ORDER — DEXTROSE 5 % IV SOLN
750.0000 mg | INTRAVENOUS | Status: DC
  Filled 2012-11-04 (×2): qty 750

## 2012-11-04 MED ORDER — NITROGLYCERIN 0.4 MG SL SUBL: 0.4000 mg | SUBLINGUAL_TABLET | SUBLINGUAL | Status: DC | PRN

## 2012-11-04 MED ORDER — VITAMIN D3 25 MCG (1000 UNIT) PO TABS
1000.0000 [IU] | ORAL_TABLET | Freq: Every day | ORAL | Status: DC
  Administered 2012-11-04: 1000 [IU] via ORAL
  Filled 2012-11-04 (×2): qty 1

## 2012-11-04 MED ORDER — FAMOTIDINE IN NACL 20-0.9 MG/50ML-% IV SOLN
20.0000 mg | INTRAVENOUS | Status: AC
  Administered 2012-11-04: 20 mg via INTRAVENOUS
  Filled 2012-11-04: qty 50

## 2012-11-04 MED ORDER — DEXMEDETOMIDINE HCL IN NACL 400 MCG/100ML IV SOLN
0.1000 ug/kg/h | INTRAVENOUS | Status: AC
  Administered 2012-11-05: 0.2 ug/kg/h via INTRAVENOUS
  Filled 2012-11-04 (×2): qty 100

## 2012-11-04 MED ORDER — VITAMIN C 500 MG PO TABS
1000.0000 mg | ORAL_TABLET | Freq: Every day | ORAL | Status: DC
  Administered 2012-11-04: 1000 mg via ORAL
  Filled 2012-11-04 (×2): qty 2

## 2012-11-04 MED ORDER — DEXTROSE 5 % IV SOLN
30.0000 ug/min | INTRAVENOUS | Status: AC
  Administered 2012-11-05: 15 ug/min via INTRAVENOUS
  Filled 2012-11-04 (×2): qty 2

## 2012-11-04 MED ORDER — METHYLPREDNISOLONE SODIUM SUCC 125 MG IJ SOLR
60.0000 mg | INTRAMUSCULAR | Status: AC
  Administered 2012-11-04: 60 mg via INTRAVENOUS
  Filled 2012-11-04: qty 2

## 2012-11-04 MED ORDER — EPINEPHRINE HCL 1 MG/ML IJ SOLN
0.5000 ug/min | INTRAVENOUS | Status: DC
  Filled 2012-11-04 (×2): qty 4

## 2012-11-04 MED ORDER — CHLORHEXIDINE GLUCONATE 4 % EX LIQD
60.0000 mL | Freq: Once | CUTANEOUS | Status: AC
  Administered 2012-11-05: 4 via TOPICAL
  Filled 2012-11-04: qty 60

## 2012-11-04 MED ORDER — VERAPAMIL HCL 2.5 MG/ML IV SOLN
INTRAVENOUS | Status: AC
  Administered 2012-11-05: 10:00:00
  Filled 2012-11-04 (×3): qty 2.5

## 2012-11-04 MED ORDER — DEXTROSE 5 % IV SOLN
1.5000 g | INTRAVENOUS | Status: AC
  Administered 2012-11-05: 1.5 g via INTRAVENOUS
  Administered 2012-11-05: .75 g via INTRAVENOUS
  Filled 2012-11-04 (×2): qty 1.5

## 2012-11-04 MED ORDER — ALBUTEROL SULFATE (5 MG/ML) 0.5% IN NEBU
2.5000 mg | INHALATION_SOLUTION | Freq: Once | RESPIRATORY_TRACT | Status: AC
  Administered 2012-11-04: 2.5 mg via RESPIRATORY_TRACT

## 2012-11-04 MED ORDER — DOPAMINE-DEXTROSE 3.2-5 MG/ML-% IV SOLN
2.0000 ug/kg/min | INTRAVENOUS | Status: AC
  Administered 2012-11-05: 3 ug/kg/min via INTRAVENOUS
  Filled 2012-11-04: qty 250

## 2012-11-04 MED ORDER — ACETAMINOPHEN 500 MG PO TABS: 1000.0000 mg | ORAL_TABLET | Freq: Four times a day (QID) | ORAL | Status: DC | PRN

## 2012-11-04 MED ORDER — DIPHENHYDRAMINE HCL 50 MG/ML IJ SOLN
25.0000 mg | INTRAMUSCULAR | Status: AC
  Administered 2012-11-04: 25 mg via INTRAVENOUS
  Filled 2012-11-04: qty 1

## 2012-11-04 MED ORDER — NAPHAZOLINE-PHENIRAMINE 0.025-0.3 % OP SOLN: 1.0000 [drp] | Freq: Four times a day (QID) | OPHTHALMIC | Status: DC | PRN

## 2012-11-04 MED ORDER — ASPIRIN 81 MG PO CHEW
81.0000 mg | CHEWABLE_TABLET | Freq: Every day | ORAL | Status: DC
  Administered 2012-11-04: 81 mg via ORAL
  Filled 2012-11-04: qty 1

## 2012-11-04 MED ORDER — OXYCODONE-ACETAMINOPHEN 5-325 MG PO TABS
1.0000 | ORAL_TABLET | ORAL | Status: DC | PRN
  Administered 2012-11-04: 2 via ORAL
  Filled 2012-11-04: qty 2

## 2012-11-04 MED ORDER — FENTANYL CITRATE 0.05 MG/ML IJ SOLN
INTRAMUSCULAR | Status: AC
  Filled 2012-11-04: qty 2

## 2012-11-04 MED ORDER — NITROGLYCERIN IN D5W 200-5 MCG/ML-% IV SOLN
2.0000 ug/min | INTRAVENOUS | Status: AC
  Administered 2012-11-05: 10 ug/min via INTRAVENOUS
  Filled 2012-11-04 (×2): qty 250

## 2012-11-04 MED ORDER — HEPARIN (PORCINE) IN NACL 2-0.9 UNIT/ML-% IJ SOLN
INTRAMUSCULAR | Status: AC
  Filled 2012-11-04: qty 1000

## 2012-11-04 MED ORDER — BISACODYL 5 MG PO TBEC
5.0000 mg | DELAYED_RELEASE_TABLET | Freq: Once | ORAL | Status: AC
  Administered 2012-11-04: 5 mg via ORAL
  Filled 2012-11-04: qty 1

## 2012-11-04 NOTE — Progress Notes (Addendum)
VASCULAR LAB PRELIMINARY  PRELIMINARY  PRELIMINARY  PRELIMINARY  Pre-op Cardiac Surgery  Carotid Findings:  Bilateral:  No evidence of hemodynamically significant internal carotid artery stenosis.   Vertebral artery flow is antegrade.      Upper Extremity Right Left  Brachial Pressures    Radial Waveforms    Ulnar Waveforms    Palmar Arch (Allen's Test)     Findings:      Lower  Extremity Right Left  Dorsalis Pedis    Anterior Tibial    Posterior Tibial    Ankle/Brachial Indices      Findings:  Palpable pedal pulses x 4.   Rodric Punch, RVT 11/04/2012, 3:50 PM

## 2012-11-04 NOTE — H&P (Signed)
Pt. Seen and examined. Agree with the NP/PA-C note as written.  Cath today showed severe LM disease and dilated aortic root. Reviewed with Dr. Donata Clay. See my cath report. Plan to admit - will obtain studies necessary to prep for surgery this week.  Chrystie Nose, MD, [facility] Attending Cardiologist [facility]

## 2012-11-04 NOTE — H&P (Signed)
[facility]          INTERVAL PROCEDURE H&P   History and Physical Interval Note:  11/04/2012 8:57 AM  [name] has presented today for their planned procedure. The various methods of treatment have been discussed with the patient and family. After consideration of risks, benefits and other options for treatment, the patient has consented to the procedure.  The patients' outpatient history has been reviewed, patient examined, and no change in status from most recent office note within the past 30 days. I have reviewed the patients' chart and labs and will proceed as planned. Questions were answered to the patient's satisfaction.   Chrystie Nose, MD, [facility] Attending Cardiologist [facility]  HILTY,Kenneth C 11/04/2012, 8:57 AM

## 2012-11-04 NOTE — CV Procedure (Addendum)
[facility]     CARDIAC CATHETERIZATION REPORT  [name]   [id number] 10-17-1950  Performing Cardiologist: Chrystie Nose Primary Physician: No primary provider on file. Primary Cardiologist:  Dr. Rennis Golden  Procedures Performed:  Left Heart Catheterization via 5 Fr right femoral artery access  Aortography 20 ml/sec for 35 ml total contrast  Native Coronary Angiography   Indication(s): Unstable angina    History: 62 y.o. female with a history of St. 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  She had a nuclear stress test in 01/2012 which was negative for ischemia. She was recently seen in the office for crescendo angina symptoms which were classic and I recommended cardiac catheterization. She has held her warfarin in anticipation of the procedure.  Consent: The procedure with Risks/Benefits/Alternatives and Indications was reviewed with the patient (and family).  All questions were answered.    Risks / Complications include, but not limited to: Death, MI, CVA/TIA, VF/VT (with defibrillation), Bradycardia (need for temporary pacer placement), contrast induced nephropathy, bleeding / bruising / hematoma / pseudoaneurysm, vascular or coronary injury (with possible emergent CT or Vascular Surgery), adverse medication reactions, infection.  She does have a reported contrast dye allergy which she was premedicated for.  The patient (and family) voice understanding and agree to proceed.    Risks of procedure as well as the alternatives and risks of each were explained to the (patient/caregiver).  Consent for procedure obtained. Consent for signed by MD and patient with RN witness -- placed on chart.  Procedure: The patient was  brought to the 2nd Floor [facility] Cardiac Catheterization Lab in the fasting state and prepped and draped in the usual sterile fashion for (Right groin) access.  Sterile technique was used including antiseptics, cap, gloves, gown, hand hygiene, mask and sheet.  Skin prep: Chlorhexidine;  Time Out: Verified patient identification, verified procedure, site/side was marked, verified correct patient position, special equipment/implants available, medications/allergies/relevent history reviewed, required imaging and test results available.  Performed  The right femoral head was identified using tactile and fluoroscopic technique.  The right groin was anesthetized with 1% subcutaneous Lidocaine.  The right Common Femoral Artery was accessed using the Modified Seldinger Technique with placement of (5 Fr) sheath using the Seldinger technique.  The sheath was aspirated and flushed.  A 5 Fr JL4 Catheter was advanced of over a Standard J wire into the ascending Aorta.  The catheter was used to engage the left coronary artery.  Limited cineangiographic views of the left coronary artery system(s) were performed gently due to dampening of the 5Fr catheter, a pullback shot was obtained. A 5 Fr JR4 Catheter was advanced of over a Safety J wire into the ascending Aorta.  The catheter was used to attempt to engage the right coronary artery, without success. A Williams right catheter also failed to engage the RCA.. This catheter was then exchanged over the Standard J wire for an angled Pigtail catheter. The aortic valve was not crossed due to it being a mechanical valve. Aortography was performed of the ascending aorta. A right coronary catheter was then exchanged and used to image the ligated left subclavian artery stump and to non-selectively visualize the RIMA. This catheter and the wire were then removed completely out of the body.  The patient was transferred to the holding area where the sheath was removed with  manual pressure held for hemostasis.   The patient was transported to the cath lab holding area in stable condition.   The patient  was stable before, during and following the procedure.   Patient did tolerate procedure well. There were not complications. EBL: <10 cc  Medications:  Premedication: 60 mg IV solumedrol, 20 mg pepcid and 25 mg benadryl (for dye allergy)  Sedation:  1 mg IV Versed, 25 IV mcg Fentanyl  Contrast:  ~75 cc Omnipaque   Hemodynamics:  Central Aortic Pressure / Mean Aortic Pressure: 154/72  LV Pressure / LV End diastolic Pressure:  (not assessed due to mechanical aortic valve)  Aortography:  There is a markedly dilated ascending aorta with a preserved aortic root - QCA measurement is 4.8 cm at the distal ascending aorta.  The Left subclavian pedicle was injected and the vessel appears ligated. The left carotid artery was not engaged.   The RIMA was non-selectively visualized and appears to be a large, patent vessel.  Coronary Angiographic Data:  Left Main:  There was dampening of the pressure waveform when engaging with the 17F catheter.  This was withdrawn and a minimally engaged, pullback shot taken of the left main coronary demonstrated >95% ostial LM stenosis.  Left Anterior Descending (LAD):  The LAD appears to be a large caliber vessel which is generally free of significant stenosis and extends around the apex.  1st diagonal (D1):  Large diagonal vessel without significant obstructive disease.  Circumflex (LCx):  Large 4.0 vessel  Which may be dominant or co-dominant. No significant stenoses.  1st obtuse marginal:  Large vessel which comes off the mid-LCX, no significant stenoses.  2nd obtuse marginal:  Smaller vessel which branches distally, no significant obstructive disease.   Right Coronary Artery: Was not able to be selectively engaged.  Filled slowly passively during the aortic root injection, arises just above the coronary cusp of the St.  Jude valve. There is a suggestion ostial stenosis.  Impression: 1.  Severe ostial LM stenosis >95%, with generally non-obstructive large caliber (4.0) distal LM -> proximal LAD/LCX. 2.  Suggestion of possible ostial RCA disease. 3.  Dilated ascending aorta measuring up to 4.8 cm by QCA. 4.  Ligated left subclavian artery with known prior left carotid to subclavian bypass, unknown status of the LIMA. 5.  Large, unobstructed RIMA which may be suitable for bypass.  Plan: 1.  Dr. Maren Beach was called to the cath lab to evaluate the images.  He has recommended a CTA to further evaluate the aorta and great vessels of the neck. 2.  Will obtain 2d echocardiogram. 3.  Plan admit to cardiac stepdown for close monitoring. Restart heparin given significant LM disease and mechanical aortic valve. 4.  Ok to eat today, no plans for surgery today - but will likely have CABG +/- aortic root graft this week.  The case and results was discussed with the patient (and family). The case and results was not discussed with the patient's PCP. The case and results was discussed with the patient's Cardiologist.  Time Spend Directly with Patient:  75 minutes  Chrystie Nose, MD, [facility] Attending Cardiologist [facility]  HILTY,Kenneth C 11/04/2012, 11:26 AM

## 2012-11-04 NOTE — Consult Note (Signed)
[address]            [name] [address]          [id number]       [name] [facility] Medical Record #[id number] Date of Birth: [birth year]  No ref. provider found No primary provider on file.  Chief Complaint:   No chief complaint on file.  chest pain  History of Present Illness:     62 year old hypertensive Afro-American female presents with symptoms of unstable angina worse with cold weather and with exertion. No resting symptoms. A stress test was done previously this year which was negative for ischemia and the ejection fraction was noted at 53%. Because of the persistent pain she underwent diagnostic cardiac catheterization today by Dr. Rennis Golden. This demonstrates ostial left main stenosis at the site of a previous Bentall root replacement using a mechanical St. Jude valve-conduit. The right coronary ostia was not well demonstrated. The coronary injection show the LAD and a dominant circumflex which appeared to be adequate targets for grafting. A 2-D echo is pending. The aortic valve appears to be functioning well. She is a 23 mm St. Jude valve with a 25 mm conduit. The aortic root injection showed evidence of a dilated ascending thoracic aorta so a CTA was performed today. This shows the ascending thoracic aorta above the Dacron graft to be 4.7 cm in diameter. There is no evidence of dissection or hematoma or ulceration. The patient has had no chest pain following cardiac cath via the femoral artery. She is on a heparin infusion. She is stop her Coumadin previously 4 days ago.  The patient is also had left subclavian stenosis with subclavian steal and had a 8 mm Dacron graft from the left carotid to the left subclavian artery and 2001. This graft is patent on CTA. The left IMA was not visualized at cath. The right IMA was injected and found to be an adequate vessel.   Current Activity/ Functional Status: Patient works full time, lives alone  stop smoking.   Past Medical History  Diagnosis Date  . Coronary artery disease 2000    aortic mechanical valve replacement  . Anginal pain   . Shortness of breath     history of sarcoidosis established by biopsy 2006-mediastinoscopy. Followed by Dr. Shelle Iron and fairly mild not requiring prednisone. Pulmonary mechanics proximally 70% of predicted and diffusion capacity proximal to 70% of predicted Hypertension Hyperlipidemia  Past Surgical History  Procedure Date  . 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 Never Smoker   Smokeless tobacco  . Not on file    History  Alcohol Use No    History   Social History  . Marital Status: Single    Spouse Name: N/A    Number of Children: N/A  . Years of Education: N/A   Occupational History  . Not on file.   Social History Main Topics  . Smoking status: Never Smoker   . Smokeless tobacco: Not on file  . Alcohol Use: No  . Drug Use:   . Sexually Active: Yes    Birth Control/ Protection: Post-menopausal   Other Topics Concern  .  Not on file   Social History Narrative  . No narrative on file    Allergies  Allergen Reactions  . Contrast Media (Iodinated Diagnostic Agents) Hives, Itching and Swelling    Current Facility-Administered Medications  Medication Dose Route Frequency Provider Last Rate Last Dose  . acetaminophen (TYLENOL) tablet 1,000 mg  1,000 mg Oral Q6H PRN Chrystie Nose, MD      . acetaminophen (TYLENOL) tablet 650 mg  650 mg Oral Q4H PRN Chrystie Nose, MD   650 mg at 11/04/12 1824  . aminocaproic acid (AMICAR) 10 g in sodium chloride 0.9 % 100 mL infusion   Intravenous To OR Chrystie Nose, MD      . amLODipine (NORVASC) tablet 5 mg  5 mg Oral Daily Chrystie Nose, MD   5 mg at 11/04/12 1322  . aspirin chewable tablet 81 mg   81 mg Oral Daily Chrystie Nose, MD   81 mg at 11/04/12 1321  . atorvastatin (LIPITOR) tablet 40 mg  40 mg Oral q1800 Chrystie Nose, MD   40 mg at 11/04/12 1700  . cefUROXime (ZINACEF) 1.5 g in dextrose 5 % 50 mL IVPB  1.5 g Intravenous To OR Chrystie Nose, MD      . cefUROXime (ZINACEF) 750 mg in dextrose 5 % 50 mL IVPB  750 mg Intravenous To OR Chrystie Nose, MD      . cholecalciferol (VITAMIN D) tablet 1,000 Units  1,000 Units Oral Daily Chrystie Nose, MD   1,000 Units at 11/04/12 1322  . dexmedetomidine (PRECEDEX) 400 MCG/100ML infusion  0.1-0.7 mcg/kg/hr Intravenous To OR Chrystie Nose, MD      . DOPamine (INTROPIN) 800 mg in dextrose 5 % 250 mL infusion  2-20 mcg/kg/min Intravenous To OR Chrystie Nose, MD      . EPINEPHrine (ADRENALIN) 4,000 mcg in dextrose 5 % 250 mL infusion  0.5-20 mcg/min Intravenous To OR Chrystie Nose, MD      . heparin infusion 2 units/mL in 0.9 % sodium chloride   Intravenous Continuous Benny Lennert, PHARMD      . insulin regular (NOVOLIN R,HUMULIN R) 1 Units/mL in sodium chloride 0.9 % 100 mL infusion   Intravenous To OR Chrystie Nose, MD      . isosorbide mononitrate (IMDUR) 24 hr tablet 15 mg  15 mg Oral Daily Chrystie Nose, MD      . magnesium sulfate (IV Push/IM) injection 40 mEq  40 mEq Other To OR Chrystie Nose, MD      . naphazoline-pheniramine (NAPHCON-A) 0.025-0.3 % ophthalmic solution 1 drop  1 drop Both Eyes QID PRN Chrystie Nose, MD      . nitroGLYCERIN (NITROSTAT) SL tablet 0.4 mg  0.4 mg Sublingual Q5 min PRN Chrystie Nose, MD      . nitroGLYCERIN 0.2 mg/mL in dextrose 5 % infusion  2-200 mcg/min Intravenous To OR Chrystie Nose, MD      . nitroglycerin-verapamil-HEPARIN-sodium bicarbonate irrigation for artery spasm   Irrigation To OR Chrystie Nose, MD      . omega-3 acid ethyl esters (LOVAZA) capsule 1 g  1 g Oral Daily Chrystie Nose, MD   1 g at 11/04/12 1322  . ondansetron (ZOFRAN) injection 4 mg  4  mg Intravenous Q6H PRN Chrystie Nose, MD      . oxyCODONE-acetaminophen (PERCOCET/ROXICET) 5-325 MG per tablet 1-2 tablet  1-2 tablet Oral Q4H PRN  Chrystie Nose, MD      . pantoprazole (PROTONIX) EC tablet 40 mg  40 mg Oral Q1200 Chrystie Nose, MD   40 mg at 11/04/12 1321  . phenylephrine (NEO-SYNEPHRINE) 20,000 mcg in dextrose 5 % 250 mL infusion  30-200 mcg/min Intravenous To OR Chrystie Nose, MD      . potassium chloride injection 80 mEq  80 mEq Other To OR Chrystie Nose, MD      . vancomycin (VANCOCIN) 1,250 mg in sodium chloride 0.9 % 250 mL IVPB  1,250 mg Intravenous To OR Chrystie Nose, MD      . vitamin C (ASCORBIC ACID) tablet 1,000 mg  1,000 mg Oral Daily Chrystie Nose, MD   1,000 mg at 11/04/12 1322   allergic to contrast media   No family history on file.   Review of Systems:    Carotid Doppler show no significant stenosis Upper extremity Dopplers show equal blood pressures   Cardiac Review of Systems: Y or N  Chest Pain Cove.Etienne ES   ]  Resting SOB [no   ] Exertional SOB  Cove.Etienne ES ]  Orthopnea [no  ]   Pedal Edema [   ]    Palpitations [ no ] Syncope  [  ]   Presyncope [ no  ]  General Review of Systems: [Y] = yes [  ]=no Constitional: recent weight change [  ]; anorexia [  ]; fatigue [  ]; nausea [  ]; night sweats [  ]; fever [  ]; or chills [  ];                                                                                                                                          Dental: poor dentition[  ]; Last Dentist visi 1 year  Eye : blurred vision [  ]; diplopia [   ]; vision changes [  ];  Amaurosis fugax[  ]; Resp: cough [  ];  wheezing[  ];  hemoptysis[  ]; shortness of breath[ Y. ]; paroxysmal nocturnal dyspnea[  ]; dyspnea on exertion[  ]; or orthopnea[  ];  GI:  gallstones[  ], vomiting[  ];  dysphagia[  ]; melena[  ];  hematochezia [  ]; heartburn[  ];   Hx of  Colonoscopy[  ]; GU: kidney stones [  ]; hematuria[  ];   dysuria [  ];  nocturia[  ];   history of     obstruction [  ];                 Skin: rash, swelling[  ];, hair loss[  ];  peripheral edema[  ];  or itching[  ]; Musculosketetal: myalgias[  ];  joint swelling[  ];  joint erythema[  ];  joint pain[  ];  back pain[  ];  Heme/Lymph: bruising[  ];  bleeding[  ];  anemia[  ];  Neuro: TIA[  ];  headaches[  ];  stroke[  ];  vertigo[  ];  seizures[  ];   paresthesias[  ];  difficulty walking[  ];  Psych:depression[  ]; anxiety[  ];  Endocrine: diabetes[  ];  thyroid dysfunction[  ];  Immunizations: Flu [  ]; Pneumococcal[  ];  Other:  Physical Exam: BP 162/72  Pulse 68  Temp 98.6 F (37 C) (Oral)  Resp 16  Ht 5\' 7"  (1.702 m)  Wt 172 lb 2.9 oz (78.1 kg)  BMI 26.97 kg/m2  SpO2 97%  General middle-aged  African American female in the CCU no acute distress HEENT normocephalic pupils equal dentition adequate Neck without JVD or mass well-healed surgical incision left side of neck Thorax without deformity or tenderness breath sounds clear Cardiac flow murmur through aortic valve sharp closure sound Abdomen soft without pulsatile mass Distal pulses all 2+ no venous insufficiency noted, no groin hematoma Neurologic alert and oriented no focal motor deficit  Diagnostic Studies & Laboratory data:   Cardiac cath reviewed with Dr. Rennis Golden CTA reviewed, PFTs reviewed  Recent Radiology Findings:   Ct Angio Chest Aorta W/cm &/or Wo/cm  11/04/2012  *RADIOLOGY REPORT*  Clinical Data: Thoracic aortic evaluation after catheterization; left carotid to subclavian bypass.  CT ANGIOGRAPHY CHEST  Technique:  Multidetector CT imaging of the chest using the standard protocol during bolus administration of intravenous contrast. Multiplanar reconstructed images including MIPs were obtained and reviewed to evaluate the vascular anatomy.  Contrast: 60mL OMNIPAQUE IOHEXOL 350 MG/ML SOLN  Comparison: 08/31/2005  Findings: Noncontrast images demonstrate considerable atherosclerotic calcification of the  thoracic aorta including the ascending thoracic aorta, along with a prosthetic aortic valve.  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  The luminal caliber in the vicinity of the bypass is approximately 4 mm on image 44 of series 602.  Relatively normal caliber aortic outflow tract noted with a chronic- appearing intimal fold in the proximal ascending aorta at the transition to the aneurysmal ascending aorta which measures up to 4.9 cm in diameter.  The anterior arch measures 4.4 cm in diameter.  Posterior arch is at 3.4 cm in diameter, but the posterior descending thoracic aorta of widens backed out 2-0.9 cm, with the ectatic aorta extending down into the abdomen.  There is considerable pathologic thoracic adenopathy.  An AP window node on image 49 of series 5 measures 1.4 cm in short axis, similar to the prior PET CT from 2006.  A right lower paratracheal node measures 1.8 cm in short axis, likewise similar to prior. Considerable bilateral hilar adenopathy noted and there is infrahilar adenopathy.  Subcarinal adenopathy observed.  A left kidney upper pole cyst is present.  A subpleural nodule in the right lower lobe along the major fissure measures 4 mm in diameter and is stable from 2006.  There is mild elevation of left hemidiaphragm.  No definite nodular airway thickening.  IMPRESSION:  1.  Thoracic aortic aneurysm, with prior repair of the aortic valve and aortic root.  No definite acute intramural hematoma or dissection observed.  There is some intimal infolding proximally in the thoracic aorta which appears to likely be chronic and potentially postoperative along the repair margin, with adjacent high density probably representing calcification along the aortic wall given the precontrast appearance. 2.  Carotid - left subclavian bypass, with  4 mm in diameter anastomotic lumen. 3.   Considerable pathologic mediastinal and hilar adenopathy appears to be chronic - malignancy is not excluded. 3.  Mildly elevated left hemidiaphragm.   Original Report Authenticated By: Gaylyn Rong, M.D.       Recent Lab Findings: Lab Results  Component Value Date   WBC 4.1 11/04/2012   HGB 14.0 11/04/2012   HCT 42.2 11/04/2012   PLT 193 11/04/2012   GLUCOSE 93 11/04/2012   NA 141 11/04/2012   K 3.7 11/04/2012   CL 105 11/04/2012   CREATININE 0.84 11/04/2012   BUN 10 11/04/2012   CO2 26 11/04/2012   INR 1.21 11/04/2012      Assessment / Plan:      62 year old hypertensive female status post St. Jude Bentall procedure November 2000 now with left main ostial stenosis unstable angina and dilatation of the ascending thoracic aorta above the previously prepared root.  Plan bypass grafts to LAD circumflex and possibly the right if adequate size. Patient will need replacement of her maintaining ascending thoracic aorta using hypothermic circulatory arrest.  Procedure discussed with patient including expected benefits, alternatives to surgery and risks of surgery including stroke bleeding infection and and death. She agrees to proceed.

## 2012-11-04 NOTE — H&P (Signed)
Patient ID: [name] MRN: [id number], DOB/AGE: 61-27-1952   Admit date: 11/04/2012   Primary Physician: Dr Clelia Croft Primary Cardiologist: Dr Rennis Golden  HPI: The patient is a pleasant, 62 year old female who had previously been followed by Dr. Clarene Duke. She had a St. Jude AVR placed in 2000 for aortic insufficiency. At that time, she had minor coronary disease. 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 She does have a contrast allergy and has had hives in the past. She has a reported history of sarcoid although this is not active. She has dyslipidemia which is treated. She had seen Dr. Clarene Duke in April 2013 with complaints of chest pain and he did a Myoview that was normal. She did well since. She says a week or 2 ago when it was extremely cold she had to park in a far lot at her work. She said by the time she got to the office she was having chest pressure and was short of breath. Since then she has noted exertional dyspnea and exertional chest pressure. There is no radiation to her arms or jaw. It is worse when she is carrying something. It is relieved with rest. She has no associated nausea, vomiting, or diaphoresis. She was seen by Dr Rennis Golden and myself as an OP and set up for cath.     Problem List: Past Medical History  Diagnosis Date  . Coronary artery disease 2000    aortic mechanical valve replacement  . Anginal pain   . Shortness of breath     Past Surgical History  Procedure Date  . Vascular surgery      Allergies:  Allergies  Allergen Reactions  . Contrast Media (Iodinated Diagnostic Agents) Hives, Itching and Swelling     Home Medications Prescriptions prior to admission  Medication Sig Dispense Refill  . acetaminophen (TYLENOL) 500 MG tablet Take 1,000 mg by mouth every 6 (six) hours as needed. For pain      . amLODipine (NORVASC) 5 MG tablet Take 5 mg by mouth daily.      . Ascorbic Acid (VITAMIN C) 1000 MG tablet Take 1,000 mg by mouth  daily.      Marland Kitchen aspirin 81 MG chewable tablet Chew 81 mg by mouth daily.      . cholecalciferol (VITAMIN D) 1000 UNITS tablet Take 1,000 Units by mouth daily.      Marland Kitchen estrogen, conjugated,-medroxyprogesterone (PREMPRO) 0.3-1.5 MG per tablet Take 1 tablet by mouth daily.      . isosorbide mononitrate (IMDUR) 30 MG 24 hr tablet Take 15 mg by mouth daily.      . naphazoline-pheniramine (NAPHCON-A) 0.025-0.3 % ophthalmic solution Place 1 drop into both eyes 2 (two) times daily as needed. For redness/dry eyes      . nitroGLYCERIN (NITROSTAT) 0.4 MG SL tablet Place 0.4 mg under the tongue every 5 (five) minutes as needed. For chest pain      . Omega-3 Fatty Acids (FISH OIL) 1200 MG CAPS Take 1,200 mg by mouth daily.      Marland Kitchen omeprazole (PRILOSEC) 20 MG capsule Take 20 mg by mouth daily.      . pravastatin (PRAVACHOL) 80 MG tablet Take 80 mg by mouth daily.      Marland Kitchen warfarin (COUMADIN) 5 MG tablet Take 5 mg by mouth daily. Takes 1/2 tab on Mon, Wed, Fri; and takes 1 tab on Tues, Thurs, Sat, Sun each week.         No family  history on file.   History   Social History  . Marital Status: Single    Spouse Name: N/A    Number of Children: N/A  . Years of Education: N/A   Occupational History  . Not on file.   Social History Main Topics  . Smoking status: Never Smoker   . Smokeless tobacco: Not on file  . Alcohol Use: No  . Drug Use:   . Sexually Active: Yes    Birth Control/ Protection: Post-menopausal   Other Topics Concern  . Not on file   Social History Narrative  . No narrative on file     Review of Systems: General: negative for chills, fever, night sweats or weight changes.  Cardiovascular: negative for chest pain, dyspnea on exertion, edema, orthopnea, palpitations, paroxysmal nocturnal dyspnea or shortness of breath Dermatological: negative for rash Respiratory: negative for cough or wheezing Urologic: negative for hematuria Abdominal: negative for nausea, vomiting, diarrhea,  bright red blood per rectum, melena, or hematemesis Neurologic: negative for visual changes, syncope, or dizziness All other systems reviewed and are otherwise negative except as noted above.  Physical Exam: Blood pressure 140/50, pulse 69, temperature 98.5 F (36.9 C), temperature source Oral, resp. rate 18, height 5\' 7"  (1.702 m), weight 78.1 kg (172 lb 2.9 oz), SpO2 99.00%.  General appearance: alert, cooperative and no distress Neck: no carotid bruit, no JVD and transmitted murmur Lungs: clear to auscultation bilaterally Heart: regular rate and rhythm and positive valve sounds Abdomen: soft, non-tender; bowel sounds normal; no masses,  no organomegaly Extremities: extremities normal, atraumatic, no cyanosis or edema Pulses: 2+ and symmetric Skin: Skin color, texture, turgor normal. No rashes or lesions Neurologic: Grossly normal    Labs:   Results for orders placed during the hospital encounter of 11/04/12 (from the past 24 hour(s))  BASIC METABOLIC PANEL     Status: Abnormal   Collection Time   11/04/12  6:50 AM      Component Value Range   Sodium 141  135 - 145 mEq/L   Potassium 3.7  3.5 - 5.1 mEq/L   Chloride 105  96 - 112 mEq/L   CO2 26  19 - 32 mEq/L   Glucose, Bld 93  70 - 99 mg/dL   BUN 10  6 - 23 mg/dL   Creatinine, Ser 8.41  0.50 - 1.10 mg/dL   Calcium 9.7  8.4 - 32.4 mg/dL   GFR calc non Af Amer 74 (*) >90 mL/min   GFR calc Af Amer 86 (*) >90 mL/min  CBC     Status: Abnormal   Collection Time   11/04/12  6:50 AM      Component Value Range   WBC 4.1  4.0 - 10.5 K/uL   RBC 5.32 (*) 3.87 - 5.11 MIL/uL   Hemoglobin 14.0  12.0 - 15.0 g/dL   HCT 40.1  02.7 - 25.3 %   MCV 79.3  78.0 - 100.0 fL   MCH 26.3  26.0 - 34.0 pg   MCHC 33.2  30.0 - 36.0 g/dL   RDW 66.4 (*) 40.3 - 47.4 %   Platelets 193  150 - 400 K/uL  PROTIME-INR     Status: Normal   Collection Time   11/04/12  6:50 AM      Component Value Range   Prothrombin Time 15.1  11.6 - 15.2 seconds   INR 1.21   0.00 - 1.49  TYPE AND SCREEN     Status: Normal   Collection  Time   11/04/12  1:15 PM      Component Value Range   ABO/RH(D) O POS     Antibody Screen NEG     Sample Expiration 11/07/2012       Radiology/Studies: Mm Digital Screening  10/21/2012  *RADIOLOGY REPORT*  Clinical Data: Screening.  DIGITAL BILATERAL SCREENING MAMMOGRAM WITH CAD  Comparison:  Previous exams.  FINDINGS:  ACR Breast Density Category 2: There is a scattered fibroglandular pattern.  No suspicious masses, architectural distortion, or calcifications are present.  Images were processed with CAD.  IMPRESSION: No mammographic evidence of malignancy.  A result letter of this screening mammogram will be mailed directly to the patient.  RECOMMENDATION: Screening mammogram in one year. (Code:SM-B-01Y)  BI-RADS CATEGORY 1:  Negative.   Original Report Authenticated By: Harmon Pier, M.D.     EKG  ASSESSMENT AND PLAN:  Principal Problem:  *Unstable angina Active Problems:  CAD (coronary artery disease), severe LM disease at cath 11/03/12  S/P AVR, St Jude 2000  Dilated ascending aorta at cath 11/03/12  Chronic anticoagulation  Contrast media allergy  HTN (hypertension)  Sarcoidosis  Plan- She is for CTA this pm. Dr Morton Peters to see.  Deland Pretty, PA-C 11/04/2012, 2:37 PM

## 2012-11-04 NOTE — Progress Notes (Signed)
*  PRELIMINARY RESULTS* Echocardiogram 2D Echocardiogram has been performed.  [name] 11/04/2012, 10:18 PM

## 2012-11-04 NOTE — Progress Notes (Signed)
ANTICOAGULATION CONSULT NOTE - Initial Consult  Pharmacy Consult for heparin Indication: Mechanical aortic valve; unstable angina  Allergies  Allergen Reactions  . Contrast Media (Iodinated Diagnostic Agents) Hives, Itching and Swelling    Patient Measurements: Height: 5\' 7"  (170.2 cm) Weight: 172 lb 2.9 oz (78.1 kg) IBW/kg (Calculated) : 61.6  Heparin Dosing Weight: 77.3 kg  Vital Signs: Temp: 98.5 F (36.9 C) (02/03 1227) Temp src: Oral (02/03 1227) BP: 140/50 mmHg (02/03 1227) Pulse Rate: 69  (02/03 1227)  Labs:  [facility] 11/04/12 0650  HGB 14.0  HCT 42.2  PLT 193  APTT --  LABPROT 15.1  INR 1.21  HEPARINUNFRC --  CREATININE 0.84  CKTOTAL --  CKMB --  TROPONINI --    Estimated Creatinine Clearance: 76.7 ml/min (by C-G formula based on Cr of 0.84).   Medical History: Past Medical History  Diagnosis Date  . Coronary artery disease 2000    aortic mechanical valve replacement  . Anginal pain   . Shortness of breath     Medications:  Scheduled:    . aminocaproic acid (AMICAR) for OHS   Intravenous To OR  . amLODipine  5 mg Oral Daily  . aspirin  81 mg Oral Daily  . atorvastatin  40 mg Oral q1800  . cefUROXime (ZINACEF)  IV  1.5 g Intravenous To OR  . cefUROXime (ZINACEF)  IV  750 mg Intravenous To OR  . cholecalciferol  1,000 Units Oral Daily  . dexmedetomidine  0.1-0.7 mcg/kg/hr Intravenous To OR  . [COMPLETED] diphenhydrAMINE  25 mg Intravenous On Call  . DOPamine  2-20 mcg/kg/min Intravenous To OR  . epinephrine  0.5-20 mcg/min Intravenous To OR  . [COMPLETED] famotidine (PEPCID) IV  20 mg Intravenous On Call  . [COMPLETED] fentaNYL      . [COMPLETED] heparin      . insulin (NOVOLIN-R) infusion   Intravenous To OR  . isosorbide mononitrate  15 mg Oral Daily  . isosorbide mononitrate  15 mg Oral Once  . [COMPLETED] lidocaine      . magnesium sulfate  40 mEq Other To OR  . [COMPLETED] methylPREDNISolone (SOLU-MEDROL) injection  60 mg Intravenous  On Call  . [COMPLETED] midazolam      . nitroGLYCERIN  2-200 mcg/min Intravenous To OR  . [COMPLETED] nitroGLYCERIN      . nitroglycerin-verapamil-HEPARIN-sodium bicarbonate irrigation for artery spasm   Irrigation To OR  . omega-3 acid ethyl esters  1 g Oral Daily  . pantoprazole  40 mg Oral Q1200  . phenylephrine (NEO-SYNEPHRINE) Adult infusion  30-200 mcg/min Intravenous To OR  . potassium chloride  80 mEq Other To OR  . vancomycin  1,250 mg Intravenous To OR  . vitamin C  1,000 mg Oral Daily  . [DISCONTINUED] isosorbide mononitrate  30 mg Oral Daily   Infusions:    . sodium chloride 100 mL/hr (11/04/12 1150)  . [DISCONTINUED] sodium chloride 75 mL/hr at 11/04/12 1610    Assessment: 61 yoF with mechanical AVR, on warfarin PTA, now s/p cath for unstable angina.  Severe LM stenosis; plan for CABG this week. Catheter site intact; no signs of active bleeding; Hgb/Hct within normal limits.  Warfarin held before cath; last INR 1.21 on 11/04/12.  Beginning heparin at 2000 (8 hours post sheath removal).  Goal of Therapy:  Heparin level 0.3-0.7 units/ml Monitor platelets by anticoagulation protocol: Yes   Plan:  Start heparin infusion at 900 units/hr (~12 units/kg/hr) Check anti-Xa level in 6 hours and daily while  on heparin Continue to monitor H&H and platelets Follow up plans for CABG  Bernadene Person PharmD Candidate 11/04/2012 4:25 PM  I have reviewed the above and agree with the plan.  Harland German, Pharm D 11/04/2012 4:26 PM

## 2012-11-05 ENCOUNTER — Encounter (HOSPITAL_COMMUNITY): Payer: Self-pay | Admitting: Anesthesiology

## 2012-11-05 ENCOUNTER — Inpatient Hospital Stay (HOSPITAL_COMMUNITY): Payer: PRIVATE HEALTH INSURANCE

## 2012-11-05 ENCOUNTER — Inpatient Hospital Stay (HOSPITAL_COMMUNITY): Payer: PRIVATE HEALTH INSURANCE | Admitting: Anesthesiology

## 2012-11-05 ENCOUNTER — Encounter (HOSPITAL_COMMUNITY)
Admission: RE | Disposition: A | Discharge status: Home / Self Care | Payer: Self-pay | Source: Ambulatory Visit | Attending: Cardiothoracic Surgery

## 2012-11-05 ENCOUNTER — Encounter (HOSPITAL_COMMUNITY): Payer: Self-pay | Admitting: *Deleted

## 2012-11-05 DIAGNOSIS — I712 Thoracic aortic aneurysm, without rupture, unspecified: Secondary | ICD-10-CM

## 2012-11-05 DIAGNOSIS — I2510 Atherosclerotic heart disease of native coronary artery without angina pectoris: Secondary | ICD-10-CM

## 2012-11-05 HISTORY — PX: ENDOVEIN HARVEST OF GREATER SAPHENOUS VEIN: SHX5059

## 2012-11-05 HISTORY — PX: REPLACEMENT ASCENDING AORTA: SHX6068

## 2012-11-05 HISTORY — PX: INTRAOPERATIVE TRANSESOPHAGEAL ECHOCARDIOGRAM: SHX5062

## 2012-11-05 HISTORY — PX: CORONARY ARTERY BYPASS GRAFT: SHX141

## 2012-11-05 LAB — BASIC METABOLIC PANEL
BUN: 12 mg/dL (ref 6–23)
CO2: 25 mEq/L (ref 19–32)
Calcium: 9.9 mg/dL (ref 8.4–10.5)
Chloride: 106 mEq/L (ref 96–112)
Creatinine, Ser: 0.77 mg/dL (ref 0.50–1.10)
GFR calc Af Amer: >90 mL/min (ref >90)
GFR calc non Af Amer: 89 mL/min — ABNORMAL LOW (ref >90)
Glucose, Bld: 145 mg/dL — ABNORMAL HIGH (ref 70–99)
Potassium: 3.8 mEq/L (ref 3.5–5.1)
Sodium: 141 mEq/L (ref 135–145)

## 2012-11-05 LAB — CBC
HCT: 41.1 % (ref 36.0–46.0)
Hemoglobin: 10.2 g/dL — ABNORMAL LOW (ref 12.0–15.0)
MCH: 28.2 pg (ref 26.0–34.0)
MCHC: 35.7 g/dL (ref 30.0–36.0)
MCV: 77.4 fL — ABNORMAL LOW (ref 78.0–100.0)
MCV: 79 fL (ref 78.0–100.0)
RBC: 5.31 MIL/uL — ABNORMAL HIGH (ref 3.87–5.11)
RBC: 3.62 MIL/uL — ABNORMAL LOW (ref 3.87–5.11)
RDW: 15.4 % (ref 11.5–15.5)
WBC: 10.9 10*3/uL — ABNORMAL HIGH (ref 4.0–10.5)

## 2012-11-05 LAB — POCT I-STAT 3, ART BLOOD GAS (G3+)
Acid-Base Excess: 3 mmol/L — ABNORMAL HIGH (ref 0.0–2.0)
Acid-base deficit: 6 mmol/L — ABNORMAL HIGH (ref 0.0–2.0)
Bicarbonate: 26.4 mEq/L — ABNORMAL HIGH (ref 20.0–24.0)
Bicarbonate: 19.2 mEq/L — ABNORMAL LOW (ref 20.0–24.0)
O2 Saturation: 100 %
O2 Saturation: 95 %
TCO2: 27 mmol/L (ref 0–100)
TCO2: 25 mmol/L (ref 0–100)
pCO2 arterial: 42.5 mmHg (ref 35.0–45.0)
pCO2 arterial: 46.6 mmHg — ABNORMAL HIGH (ref 35.0–45.0)
pCO2 arterial: 38.9 mmHg (ref 35.0–45.0)
pH, Arterial: 7.308 — ABNORMAL LOW (ref 7.350–7.450)
pH, Arterial: 7.396 (ref 7.350–7.450)
pO2, Arterial: 506 mmHg — ABNORMAL HIGH (ref 80.0–100.0)
pO2, Arterial: 432 mmHg — ABNORMAL HIGH (ref 80.0–100.0)
pO2, Arterial: 104 mmHg — ABNORMAL HIGH (ref 80.0–100.0)

## 2012-11-05 LAB — POCT I-STAT 4, (NA,K, GLUC, HGB,HCT)
Glucose, Bld: 128 mg/dL — ABNORMAL HIGH (ref 70–99)
Glucose, Bld: 91 mg/dL (ref 70–99)
Glucose, Bld: 133 mg/dL — ABNORMAL HIGH (ref 70–99)
Glucose, Bld: 210 mg/dL — ABNORMAL HIGH (ref 70–99)
Glucose, Bld: 187 mg/dL — ABNORMAL HIGH (ref 70–99)
HCT: 22 % — ABNORMAL LOW (ref 36.0–46.0)
HCT: 20 % — ABNORMAL LOW (ref 36.0–46.0)
HCT: 28 % — ABNORMAL LOW (ref 36.0–46.0)
Hemoglobin: 12.2 g/dL (ref 12.0–15.0)
Hemoglobin: 7.5 g/dL — ABNORMAL LOW (ref 12.0–15.0)
Hemoglobin: 8.5 g/dL — ABNORMAL LOW (ref 12.0–15.0)
Hemoglobin: 8.2 g/dL — ABNORMAL LOW (ref 12.0–15.0)
Hemoglobin: 9.5 g/dL — ABNORMAL LOW (ref 12.0–15.0)
Potassium: 4.1 mEq/L (ref 3.5–5.1)
Potassium: 3.7 mEq/L (ref 3.5–5.1)
Potassium: 4.2 mEq/L (ref 3.5–5.1)
Sodium: 142 mEq/L (ref 135–145)
Sodium: 137 mEq/L (ref 135–145)
Sodium: 141 mEq/L (ref 135–145)

## 2012-11-05 LAB — PLATELET COUNT: Platelets: 50 10*3/uL — ABNORMAL LOW (ref 150–400)

## 2012-11-05 LAB — GLUCOSE, CAPILLARY
Glucose-Capillary: 176 mg/dL — ABNORMAL HIGH (ref 70–99)
Glucose-Capillary: 132 mg/dL — ABNORMAL HIGH (ref 70–99)

## 2012-11-05 LAB — HEMOGLOBIN AND HEMATOCRIT, BLOOD
HCT: 25.3 % — ABNORMAL LOW (ref 36.0–46.0)
Hemoglobin: 8.8 g/dL — ABNORMAL LOW (ref 12.0–15.0)

## 2012-11-05 SURGERY — CORONARY ARTERY BYPASS GRAFTING (CABG)
Anesthesia: General | Site: Leg Upper | Laterality: Right | Wound class: Clean

## 2012-11-05 MED ORDER — ACETAMINOPHEN 500 MG PO TABS
1000.0000 mg | ORAL_TABLET | Freq: Four times a day (QID) | ORAL | Status: AC
  Administered 2012-11-06 – 2012-11-10 (×15): 1000 mg via ORAL
  Filled 2012-11-05 (×18): qty 2

## 2012-11-05 MED ORDER — AMIODARONE HCL IN DEXTROSE 360-4.14 MG/200ML-% IV SOLN
60.0000 mg/h | INTRAVENOUS | Status: AC
  Administered 2012-11-05: 60 mg/h via INTRAVENOUS

## 2012-11-05 MED ORDER — AMIODARONE HCL IN DEXTROSE 360-4.14 MG/200ML-% IV SOLN
INTRAVENOUS | Status: AC
  Filled 2012-11-05: qty 200

## 2012-11-05 MED ORDER — LACTATED RINGERS IV SOLN
INTRAVENOUS | Status: DC | PRN
  Administered 2012-11-05 (×3): via INTRAVENOUS

## 2012-11-05 MED ORDER — NAPHAZOLINE-PHENIRAMINE 0.025-0.3 % OP SOLN
1.0000 [drp] | Freq: Four times a day (QID) | OPHTHALMIC | Status: DC | PRN
  Filled 2012-11-05: qty 15

## 2012-11-05 MED ORDER — PROPOFOL 10 MG/ML IV BOLUS
INTRAVENOUS | Status: DC | PRN
  Administered 2012-11-05: 70 mg via INTRAVENOUS

## 2012-11-05 MED ORDER — SODIUM CHLORIDE 0.9 % IR SOLN
Status: DC | PRN
  Administered 2012-11-05: 1000 mL

## 2012-11-05 MED ORDER — BISACODYL 5 MG PO TBEC
10.0000 mg | DELAYED_RELEASE_TABLET | Freq: Every day | ORAL | Status: DC
  Administered 2012-11-07 – 2012-11-25 (×9): 10 mg via ORAL
  Filled 2012-11-05 (×6): qty 2
  Filled 2012-11-05: qty 1
  Filled 2012-11-05 (×4): qty 2

## 2012-11-05 MED ORDER — SODIUM CHLORIDE 0.9 % IJ SOLN
3.0000 mL | INTRAMUSCULAR | Status: DC | PRN
  Administered 2012-11-07: 3 mL via INTRAVENOUS

## 2012-11-05 MED ORDER — DOPAMINE-DEXTROSE 3.2-5 MG/ML-% IV SOLN
0.0000 ug/kg/min | INTRAVENOUS | Status: DC
  Administered 2012-11-05: 5 ug/kg/min via INTRAVENOUS
  Administered 2012-11-07: 3 ug/kg/min via INTRAVENOUS
  Filled 2012-11-05: qty 250

## 2012-11-05 MED ORDER — ETOMIDATE 2 MG/ML IV SOLN
INTRAVENOUS | Status: DC | PRN
  Administered 2012-11-05: 4 mg via INTRAVENOUS
  Administered 2012-11-05 (×2): 2 mg via INTRAVENOUS
  Administered 2012-11-05: 4 mg via INTRAVENOUS

## 2012-11-05 MED ORDER — HEPARIN SODIUM (PORCINE) 1000 UNIT/ML IJ SOLN
INTRAMUSCULAR | Status: DC | PRN
  Administered 2012-11-05: 3000 [IU] via INTRAVENOUS
  Administered 2012-11-05 (×2): 2000 [IU] via INTRAVENOUS
  Administered 2012-11-05: 10000 [IU] via INTRAVENOUS

## 2012-11-05 MED ORDER — COAGULATION FACTOR VIIA RECOMB 1 MG IV SOLR
4.0000 mg | Freq: Once | INTRAVENOUS | Status: DC
  Filled 2012-11-05: qty 4

## 2012-11-05 MED ORDER — MILRINONE IN DEXTROSE 20 MG/100ML IV SOLN
0.1250 ug/kg/min | INTRAVENOUS | Status: DC
  Filled 2012-11-05: qty 100

## 2012-11-05 MED ORDER — METOPROLOL TARTRATE 1 MG/ML IV SOLN: 2.5000 mg | INTRAVENOUS | Status: DC | PRN

## 2012-11-05 MED ORDER — NITROPRUSSIDE SODIUM 25 MG/ML IV SOLN
50000.0000 ug | INTRAVENOUS | Status: DC | PRN
  Administered 2012-11-05: 0.3 ug/kg/min via INTRAVENOUS

## 2012-11-05 MED ORDER — MAGNESIUM SULFATE 40 MG/ML IJ SOLN
4.0000 g | Freq: Once | INTRAMUSCULAR | Status: AC
  Administered 2012-11-05: 4 g via INTRAVENOUS
  Filled 2012-11-05: qty 100

## 2012-11-05 MED ORDER — ASPIRIN EC 325 MG PO TBEC
325.0000 mg | DELAYED_RELEASE_TABLET | Freq: Every day | ORAL | Status: DC
  Administered 2012-11-07 – 2012-11-11 (×5): 325 mg via ORAL
  Filled 2012-11-05 (×7): qty 1

## 2012-11-05 MED ORDER — INSULIN REGULAR BOLUS VIA INFUSION
0.0000 [IU] | Freq: Three times a day (TID) | INTRAVENOUS | Status: DC
  Filled 2012-11-05: qty 10

## 2012-11-05 MED ORDER — METOPROLOL TARTRATE 12.5 MG HALF TABLET
12.5000 mg | ORAL_TABLET | Freq: Two times a day (BID) | ORAL | Status: DC
  Administered 2012-11-07: 12.5 mg via ORAL
  Filled 2012-11-05 (×5): qty 1

## 2012-11-05 MED ORDER — LACTATED RINGERS IV SOLN
INTRAVENOUS | Status: DC
  Administered 2012-11-05: 20:00:00 via INTRAVENOUS

## 2012-11-05 MED ORDER — HEMOSTATIC AGENTS (NO CHARGE) OPTIME
TOPICAL | Status: DC | PRN
  Administered 2012-11-05: 2 via TOPICAL

## 2012-11-05 MED ORDER — PROTAMINE SULFATE 10 MG/ML IV SOLN
INTRAVENOUS | Status: DC | PRN
  Administered 2012-11-05: 50 mg via INTRAVENOUS
  Administered 2012-11-05: 100 mg via INTRAVENOUS
  Administered 2012-11-05: 10 mg via INTRAVENOUS

## 2012-11-05 MED ORDER — SODIUM CHLORIDE 0.9 % IV SOLN
5.0000 g/h | Freq: Once | INTRAVENOUS | Status: DC
  Filled 2012-11-05: qty 20

## 2012-11-05 MED ORDER — SODIUM BICARBONATE 8.4 % IV SOLN
INTRAVENOUS | Status: DC | PRN
  Administered 2012-11-05: 50 meq via INTRAVENOUS

## 2012-11-05 MED ORDER — ARTIFICIAL TEARS OP OINT
TOPICAL_OINTMENT | OPHTHALMIC | Status: DC | PRN
  Administered 2012-11-05: 1 via OPHTHALMIC

## 2012-11-05 MED ORDER — DEXTROSE 5 % IV SOLN
1.5000 g | Freq: Two times a day (BID) | INTRAVENOUS | Status: AC
  Administered 2012-11-05 – 2012-11-07 (×4): 1.5 g via INTRAVENOUS
  Filled 2012-11-05 (×4): qty 1.5

## 2012-11-05 MED ORDER — MIDAZOLAM HCL 2 MG/2ML IJ SOLN
2.0000 mg | INTRAMUSCULAR | Status: DC | PRN
  Administered 2012-11-05 (×2): 2 mg via INTRAVENOUS
  Filled 2012-11-05 (×2): qty 2

## 2012-11-05 MED ORDER — DOCUSATE SODIUM 100 MG PO CAPS
200.0000 mg | ORAL_CAPSULE | Freq: Every day | ORAL | Status: DC
  Administered 2012-11-07 – 2012-11-21 (×11): 200 mg via ORAL
  Filled 2012-11-05 (×14): qty 2

## 2012-11-05 MED ORDER — FENTANYL CITRATE 0.05 MG/ML IJ SOLN
INTRAMUSCULAR | Status: DC | PRN
  Administered 2012-11-05: 250 ug via INTRAVENOUS
  Administered 2012-11-05: 100 ug via INTRAVENOUS
  Administered 2012-11-05: 200 ug via INTRAVENOUS
  Administered 2012-11-05: 250 ug via INTRAVENOUS
  Administered 2012-11-05: 150 ug via INTRAVENOUS
  Administered 2012-11-05: 250 ug via INTRAVENOUS
  Administered 2012-11-05: 100 ug via INTRAVENOUS
  Administered 2012-11-05: 200 ug via INTRAVENOUS

## 2012-11-05 MED ORDER — POTASSIUM CHLORIDE 10 MEQ/50ML IV SOLN
10.0000 meq | INTRAVENOUS | Status: AC
  Administered 2012-11-05 (×4): 10 meq via INTRAVENOUS

## 2012-11-05 MED ORDER — ROCURONIUM BROMIDE 100 MG/10ML IV SOLN
INTRAVENOUS | Status: DC | PRN
  Administered 2012-11-05: 40 mg via INTRAVENOUS
  Administered 2012-11-05: 60 mg via INTRAVENOUS

## 2012-11-05 MED ORDER — SODIUM CHLORIDE 0.9 % IV SOLN: 250.0000 mL | INTRAVENOUS | Status: DC | PRN

## 2012-11-05 MED ORDER — LACTATED RINGERS IV SOLN: 500.0000 mL | Freq: Once | INTRAVENOUS | Status: AC | PRN

## 2012-11-05 MED ORDER — PHENYLEPHRINE HCL 10 MG/ML IJ SOLN
0.0000 ug/min | INTRAVENOUS | Status: DC
  Administered 2012-11-05: 5 ug/min via INTRAVENOUS
  Filled 2012-11-05: qty 2

## 2012-11-05 MED ORDER — INSULIN REGULAR HUMAN 100 UNIT/ML IJ SOLN
INTRAMUSCULAR | Status: DC
  Administered 2012-11-06: 5.8 [IU]/h via INTRAVENOUS
  Filled 2012-11-05 (×2): qty 1

## 2012-11-05 MED ORDER — MORPHINE SULFATE 2 MG/ML IJ SOLN
2.0000 mg | INTRAMUSCULAR | Status: DC | PRN
  Administered 2012-11-06 (×2): 2 mg via INTRAVENOUS
  Filled 2012-11-05 (×2): qty 1
  Filled 2012-11-05: qty 2
  Filled 2012-11-05: qty 1
  Filled 2012-11-05 (×2): qty 2

## 2012-11-05 MED ORDER — COAGULATION FACTOR VIIA RECOMB 1 MG IV SOLR
INTRAVENOUS | Status: DC | PRN
  Administered 2012-11-05: 4 mg via INTRAVENOUS

## 2012-11-05 MED ORDER — PANTOPRAZOLE SODIUM 40 MG PO TBEC
40.0000 mg | DELAYED_RELEASE_TABLET | Freq: Every day | ORAL | Status: DC
  Administered 2012-11-07 – 2012-11-26 (×20): 40 mg via ORAL
  Filled 2012-11-05 (×19): qty 1

## 2012-11-05 MED ORDER — DOPAMINE-DEXTROSE 3.2-5 MG/ML-% IV SOLN: 0.0000 ug/kg/min | INTRAVENOUS | Status: DC

## 2012-11-05 MED ORDER — CALCIUM GLUCONATE 10 % IV SOLN
INTRAVENOUS | Status: DC | PRN
  Administered 2012-11-05: 200 mg via INTRAVENOUS

## 2012-11-05 MED ORDER — MILRINONE IN DEXTROSE 20 MG/100ML IV SOLN
INTRAVENOUS | Status: DC | PRN
  Administered 2012-11-05: .3 ug/kg/min via INTRAVENOUS

## 2012-11-05 MED ORDER — SODIUM CHLORIDE 0.9 % IV SOLN
INTRAVENOUS | Status: DC | PRN
  Administered 2012-11-05: 10:00:00 via INTRAVENOUS

## 2012-11-05 MED ORDER — MILRINONE IN DEXTROSE 20 MG/100ML IV SOLN
0.3750 ug/kg/min | INTRAVENOUS | Status: DC
  Administered 2012-11-05 – 2012-11-06 (×2): 0.375 ug/kg/min via INTRAVENOUS
  Filled 2012-11-05: qty 100

## 2012-11-05 MED ORDER — SODIUM CHLORIDE 0.9 % IV SOLN
20.0000 ug | Freq: Once | INTRAVENOUS | Status: DC
  Filled 2012-11-05: qty 5

## 2012-11-05 MED ORDER — DEXMEDETOMIDINE HCL IN NACL 200 MCG/50ML IV SOLN
0.1000 ug/kg/h | INTRAVENOUS | Status: DC
  Administered 2012-11-05 – 2012-11-06 (×3): 0.7 ug/kg/h via INTRAVENOUS
  Administered 2012-11-06: 0.6 ug/kg/h via INTRAVENOUS
  Filled 2012-11-05 (×4): qty 50

## 2012-11-05 MED ORDER — NITROPRUSSIDE SODIUM 25 MG/ML IV SOLN
0.2500 ug/kg/min | INTRAVENOUS | Status: DC
  Filled 2012-11-05: qty 2

## 2012-11-05 MED ORDER — METHYLPREDNISOLONE SODIUM SUCC 125 MG IJ SOLR
INTRAMUSCULAR | Status: DC | PRN
  Administered 2012-11-05: 500 mg via INTRAVENOUS

## 2012-11-05 MED ORDER — ASPIRIN 81 MG PO CHEW: 324.0000 mg | CHEWABLE_TABLET | Freq: Every day | ORAL | Status: DC

## 2012-11-05 MED ORDER — SODIUM CHLORIDE 0.9 % IV SOLN
20.0000 ug | INTRAVENOUS | Status: DC | PRN
  Administered 2012-11-05: 20 ug via INTRAVENOUS

## 2012-11-05 MED ORDER — VANCOMYCIN HCL IN DEXTROSE 1-5 GM/200ML-% IV SOLN
1000.0000 mg | Freq: Once | INTRAVENOUS | Status: AC
  Administered 2012-11-06: 1000 mg via INTRAVENOUS
  Filled 2012-11-05: qty 200

## 2012-11-05 MED ORDER — HEMOSTATIC AGENTS (NO CHARGE) OPTIME
TOPICAL | Status: DC | PRN
  Administered 2012-11-05: 1 via TOPICAL

## 2012-11-05 MED ORDER — SODIUM CHLORIDE 0.9 % IV SOLN
INTRAVENOUS | Status: DC
  Administered 2012-11-05 – 2012-11-06 (×2): via INTRAVENOUS
  Administered 2012-11-08: 20 mL via INTRAVENOUS
  Administered 2012-11-12: 01:00:00 via INTRAVENOUS

## 2012-11-05 MED ORDER — SODIUM CHLORIDE 0.9 % IJ SOLN
OROMUCOSAL | Status: DC | PRN
  Administered 2012-11-05: 10:00:00 via TOPICAL

## 2012-11-05 MED ORDER — NITROGLYCERIN IN D5W 200-5 MCG/ML-% IV SOLN
0.0000 ug/min | INTRAVENOUS | Status: DC
  Administered 2012-11-05: 20 ug/min via INTRAVENOUS

## 2012-11-05 MED ORDER — ATORVASTATIN CALCIUM 40 MG PO TABS
40.0000 mg | ORAL_TABLET | Freq: Every day | ORAL | Status: DC
  Administered 2012-11-06 – 2012-11-25 (×19): 40 mg via ORAL
  Filled 2012-11-05 (×21): qty 1

## 2012-11-05 MED ORDER — ACETAMINOPHEN 10 MG/ML IV SOLN
1000.0000 mg | Freq: Once | INTRAVENOUS | Status: AC
  Administered 2012-11-06: 1000 mg via INTRAVENOUS
  Filled 2012-11-05: qty 100

## 2012-11-05 MED ORDER — LACTATED RINGERS IV SOLN
INTRAVENOUS | Status: DC | PRN
  Administered 2012-11-05: 07:00:00 via INTRAVENOUS

## 2012-11-05 MED ORDER — METOPROLOL TARTRATE 25 MG/10 ML ORAL SUSPENSION
12.5000 mg | Freq: Two times a day (BID) | ORAL | Status: DC
  Administered 2012-11-05 – 2012-11-06 (×2): 12.5 mg
  Filled 2012-11-05 (×5): qty 5

## 2012-11-05 MED ORDER — ALBUMIN HUMAN 5 % IV SOLN
250.0000 mL | INTRAVENOUS | Status: AC | PRN
  Administered 2012-11-05 – 2012-11-06 (×2): 250 mL via INTRAVENOUS

## 2012-11-05 MED ORDER — MIDAZOLAM HCL 5 MG/5ML IJ SOLN
INTRAMUSCULAR | Status: DC | PRN
  Administered 2012-11-05 (×2): 2 mg via INTRAVENOUS
  Administered 2012-11-05: 5 mg via INTRAVENOUS
  Administered 2012-11-05: 4 mg via INTRAVENOUS
  Administered 2012-11-05: 2 mg via INTRAVENOUS

## 2012-11-05 MED ORDER — ALBUMIN HUMAN 5 % IV SOLN
INTRAVENOUS | Status: DC | PRN
  Administered 2012-11-05: 16:00:00 via INTRAVENOUS

## 2012-11-05 MED ORDER — VECURONIUM BROMIDE 10 MG IV SOLR
INTRAVENOUS | Status: DC | PRN
  Administered 2012-11-05: 5 mg via INTRAVENOUS
  Administered 2012-11-05: 6 mg via INTRAVENOUS
  Administered 2012-11-05: 5 mg via INTRAVENOUS
  Administered 2012-11-05: 4 mg via INTRAVENOUS

## 2012-11-05 MED ORDER — ACETAMINOPHEN 160 MG/5ML PO SOLN
975.0000 mg | Freq: Four times a day (QID) | ORAL | Status: AC
  Administered 2012-11-06: 975 mg
  Filled 2012-11-05: qty 40.6

## 2012-11-05 MED ORDER — MORPHINE SULFATE 2 MG/ML IJ SOLN
1.0000 mg | INTRAMUSCULAR | Status: AC | PRN
  Administered 2012-11-05 – 2012-11-06 (×3): 4 mg via INTRAVENOUS

## 2012-11-05 MED ORDER — AMIODARONE HCL IN DEXTROSE 360-4.14 MG/200ML-% IV SOLN
30.0000 mg/h | INTRAVENOUS | Status: DC
  Administered 2012-11-06: 30 mg/h via INTRAVENOUS
  Filled 2012-11-05 (×2): qty 200

## 2012-11-05 MED ORDER — SODIUM CHLORIDE 0.45 % IV SOLN
INTRAVENOUS | Status: DC
  Administered 2012-11-05: 20:00:00 via INTRAVENOUS

## 2012-11-05 MED ORDER — OXYCODONE HCL 5 MG PO TABS: 5.0000 mg | ORAL_TABLET | ORAL | Status: DC | PRN

## 2012-11-05 MED ORDER — BISACODYL 10 MG RE SUPP
10.0000 mg | Freq: Every day | RECTAL | Status: DC
Start: 2012-11-06 — End: 2012-11-26

## 2012-11-05 MED ORDER — LIDOCAINE HCL (CARDIAC) 20 MG/ML IV SOLN
INTRAVENOUS | Status: DC | PRN
  Administered 2012-11-05: 100 mg via INTRAVENOUS

## 2012-11-05 MED ORDER — HEPARIN SODIUM (PORCINE) 1000 UNIT/ML IJ SOLN
INTRAMUSCULAR | Status: AC
  Filled 2012-11-05: qty 2

## 2012-11-05 MED ORDER — FAMOTIDINE IN NACL 20-0.9 MG/50ML-% IV SOLN
20.0000 mg | Freq: Two times a day (BID) | INTRAVENOUS | Status: AC
  Administered 2012-11-05: 20 mg via INTRAVENOUS

## 2012-11-05 MED ORDER — SODIUM CHLORIDE 0.9 % IJ SOLN
3.0000 mL | Freq: Two times a day (BID) | INTRAMUSCULAR | Status: DC
  Administered 2012-11-06 – 2012-11-15 (×7): 3 mL via INTRAVENOUS

## 2012-11-05 MED ORDER — ONDANSETRON HCL 4 MG/2ML IJ SOLN
4.0000 mg | Freq: Four times a day (QID) | INTRAMUSCULAR | Status: DC | PRN
  Administered 2012-11-06 – 2012-11-23 (×6): 4 mg via INTRAVENOUS
  Filled 2012-11-05 (×6): qty 2

## 2012-11-05 SURGICAL SUPPLY — 170 items
ADAPTER CARDIO PERF ANTE/RETRO (ADAPTER) ×5 IMPLANT
ADH SKN CLS APL DERMABOND .7 (GAUZE/BANDAGES/DRESSINGS) ×3
ADH SRG 12 PREFL SYR 3 SPRDR (MISCELLANEOUS)
ADPR PRFSN 84XANTGRD RTRGD (ADAPTER) ×3
APL SRG 7X2 LUM MLBL SLNT (VASCULAR PRODUCTS) ×3
APPLICATOR TIP COSEAL (VASCULAR PRODUCTS) ×2 IMPLANT
ATTRACTOMAT 16X20 MAGNETIC DRP (DRAPES) ×5 IMPLANT
BAG DECANTER FOR FLEXI CONT (MISCELLANEOUS) ×5 IMPLANT
BANDAGE ELASTIC 4 VELCRO ST LF (GAUZE/BANDAGES/DRESSINGS) ×5 IMPLANT
BANDAGE ELASTIC 6 VELCRO ST LF (GAUZE/BANDAGES/DRESSINGS) ×5 IMPLANT
BANDAGE GAUZE ELAST BULKY 4 IN (GAUZE/BANDAGES/DRESSINGS) ×5 IMPLANT
BASKET HEART  (ORDER IN 25'S) (MISCELLANEOUS) ×1
BASKET HEART (ORDER IN 25'S) (MISCELLANEOUS) ×1
BASKET HEART (ORDER IN 25S) (MISCELLANEOUS) ×3 IMPLANT
BLADE CORE FAN STRYKER (BLADE) ×2 IMPLANT
BLADE STERNUM SYSTEM 6 (BLADE) ×5 IMPLANT
BLADE SURG 11 STRL SS (BLADE) ×2 IMPLANT
BLADE SURG 12 STRL SS (BLADE) ×5 IMPLANT
BLADE SURG 15 STRL LF DISP TIS (BLADE) IMPLANT
BLADE SURG 15 STRL SS (BLADE) ×15
BLADE SURG ROTATE 9660 (MISCELLANEOUS) ×2 IMPLANT
CANISTER SUCTION 2500CC (MISCELLANEOUS) ×5 IMPLANT
CANNULA AORTIC HI-FLOW 6.5M20F (CANNULA) ×5 IMPLANT
CANNULA GUNDRY RCSP 15FR (MISCELLANEOUS) ×5 IMPLANT
CANNULA VENOUS MAL SGL STG 40 (MISCELLANEOUS) IMPLANT
CANNULAE VENOUS MAL SGL STG 40 (MISCELLANEOUS) ×5
CATH CPB KIT VANTRIGT (MISCELLANEOUS) ×5 IMPLANT
CATH HEART VENT LEFT (CATHETERS) IMPLANT
CATH RETROPLEGIA CORONARY 14FR (CATHETERS) IMPLANT
CATH ROBINSON RED A/P 18FR (CATHETERS) ×15 IMPLANT
CATH THORACIC 28FR (CATHETERS) IMPLANT
CATH THORACIC 28FR RT ANG (CATHETERS) IMPLANT
CATH THORACIC 36FR (CATHETERS) IMPLANT
CATH THORACIC 36FR RT ANG (CATHETERS) ×10 IMPLANT
CAUTERY EYE LOW TEMP 1300F FIN (OPHTHALMIC RELATED) ×5 IMPLANT
CLIP FOGARTY SPRING 6M (CLIP) IMPLANT
CLIP TI WIDE RED SMALL 24 (CLIP) IMPLANT
CLOTH BEACON ORANGE TIMEOUT ST (SAFETY) ×5 IMPLANT
CONN 1/2X1/2X1/2  BEN (MISCELLANEOUS) ×2
CONN 1/2X1/2X1/2 BEN (MISCELLANEOUS) IMPLANT
CONN 3/8X1/2 ST GISH (MISCELLANEOUS) ×2 IMPLANT
CONN 3/8X3/8 GISH STERILE (MISCELLANEOUS) ×2 IMPLANT
CONN ST 1/4X3/8  BEN (MISCELLANEOUS) ×2
CONN ST 1/4X3/8 BEN (MISCELLANEOUS) IMPLANT
CONT SPEC 4OZ CLIKSEAL STRL BL (MISCELLANEOUS) ×2 IMPLANT
COVER MAYO STAND STRL (DRAPES) ×2 IMPLANT
COVER SURGICAL LIGHT HANDLE (MISCELLANEOUS) ×10 IMPLANT
CRADLE DONUT ADULT HEAD (MISCELLANEOUS) ×5 IMPLANT
DERMABOND ADVANCED (GAUZE/BANDAGES/DRESSINGS) ×2
DERMABOND ADVANCED .7 DNX12 (GAUZE/BANDAGES/DRESSINGS) IMPLANT
DRAIN CHANNEL 32F RND 10.7 FF (WOUND CARE) ×5 IMPLANT
DRAPE CARDIOVASCULAR INCISE (DRAPES) ×5
DRAPE PROXIMA HALF (DRAPES) ×2 IMPLANT
DRAPE SLUSH MACHINE 52X66 (DRAPES) ×3 IMPLANT
DRAPE SLUSH/WARMER DISC (DRAPES) ×2 IMPLANT
DRAPE SRG 135X102X78XABS (DRAPES) ×3 IMPLANT
DRSG COVADERM 4X14 (GAUZE/BANDAGES/DRESSINGS) ×5 IMPLANT
ELECT BLADE 4.0 EZ CLEAN MEGAD (MISCELLANEOUS) ×5
ELECT BLADE 6.5 EXT (BLADE) ×5 IMPLANT
ELECT CAUTERY BLADE 6.4 (BLADE) ×5 IMPLANT
ELECT REM PT RETURN 9FT ADLT (ELECTROSURGICAL) ×10
ELECTRODE BLDE 4.0 EZ CLN MEGD (MISCELLANEOUS) ×3 IMPLANT
ELECTRODE REM PT RTRN 9FT ADLT (ELECTROSURGICAL) ×6 IMPLANT
FEMORAL VENOUS CANN RAP (CANNULA) ×2 IMPLANT
GAUZE XEROFORM 1X8 LF (GAUZE/BANDAGES/DRESSINGS) ×4 IMPLANT
GLOVE BIO SURGEON STRL SZ 6 (GLOVE) ×10 IMPLANT
GLOVE BIO SURGEON STRL SZ 6.5 (GLOVE) ×4 IMPLANT
GLOVE BIO SURGEON STRL SZ7.5 (GLOVE) ×22 IMPLANT
GLOVE BIO SURGEON STRL SZ8 (GLOVE) ×2 IMPLANT
GLOVE BIO SURGEONS STRL SZ 6.5 (GLOVE) ×4
GLOVE BIOGEL PI IND STRL 6.5 (GLOVE) IMPLANT
GLOVE BIOGEL PI IND STRL 7.0 (GLOVE) IMPLANT
GLOVE BIOGEL PI INDICATOR 6.5 (GLOVE) ×6
GLOVE BIOGEL PI INDICATOR 7.0 (GLOVE) ×4
GOWN PREVENTION PLUS XLARGE (GOWN DISPOSABLE) ×8 IMPLANT
GOWN STRL NON-REIN LRG LVL3 (GOWN DISPOSABLE) ×38 IMPLANT
GRAFT WOVEN D/V 30DX30L (Vascular Products) ×2 IMPLANT
HEMOSTAT POWDER SURGIFOAM 1G (HEMOSTASIS) ×19 IMPLANT
HEMOSTAT SURGICEL 2X14 (HEMOSTASIS) ×7 IMPLANT
HEMOSTAT SURGICEL 2X4 FIBR (HEMOSTASIS) ×2 IMPLANT
INSERT FOGARTY XLG (MISCELLANEOUS) ×2 IMPLANT
KIT BASIN OR (CUSTOM PROCEDURE TRAY) ×5 IMPLANT
KIT DILATOR VASC 18G NDL (KITS) ×2 IMPLANT
KIT DRAINAGE VACCUM ASSIST (KITS) ×2 IMPLANT
KIT PAIN CUSTOM (MISCELLANEOUS) IMPLANT
KIT ROOM TURNOVER OR (KITS) ×5 IMPLANT
KIT SUCTION CATH 14FR (SUCTIONS) ×5 IMPLANT
KIT VASOVIEW W/TROCAR VH 2000 (KITS) ×5 IMPLANT
LEAD PACING MYOCARDI (MISCELLANEOUS) ×5 IMPLANT
LINE VENT (MISCELLANEOUS) ×2 IMPLANT
MARKER GRAFT CORONARY BYPASS (MISCELLANEOUS) ×15 IMPLANT
MATRIX HEMOSTAT SURGIFLO (HEMOSTASIS) ×4 IMPLANT
NS IRRIG 1000ML POUR BTL (IV SOLUTION) ×31 IMPLANT
PACK OPEN HEART (CUSTOM PROCEDURE TRAY) ×5 IMPLANT
PAD ARMBOARD 7.5X6 YLW CONV (MISCELLANEOUS) ×10 IMPLANT
PAD ELECT DEFIB RADIOL ZOLL (MISCELLANEOUS) ×2 IMPLANT
PENCIL BUTTON HOLSTER BLD 10FT (ELECTRODE) ×5 IMPLANT
PUNCH AORTIC ROTATE 4.0MM (MISCELLANEOUS) IMPLANT
PUNCH AORTIC ROTATE 4.5MM 8IN (MISCELLANEOUS) IMPLANT
PUNCH AORTIC ROTATE 5MM 8IN (MISCELLANEOUS) IMPLANT
SET CARDIOPLEGIA MPS 5001102 (MISCELLANEOUS) ×2 IMPLANT
SHEATH AVANTI 11CM 5FR (MISCELLANEOUS) ×2 IMPLANT
SPONGE GAUZE 4X4 12PLY (GAUZE/BANDAGES/DRESSINGS) ×12 IMPLANT
SPONGE LAP 18X18 X RAY DECT (DISPOSABLE) ×6 IMPLANT
SPONGE LAP 4X18 X RAY DECT (DISPOSABLE) ×4 IMPLANT
STOPCOCK 4 WAY LG BORE MALE ST (IV SETS) ×2 IMPLANT
SUCKER INTRACARDIAC WEIGHTED (SUCKER) ×4 IMPLANT
SUT BONE WAX W31G (SUTURE) ×5 IMPLANT
SUT ETHIBON 2 0 V 52N 30 (SUTURE) ×5 IMPLANT
SUT ETHIBON EXCEL 2-0 V-5 (SUTURE) IMPLANT
SUT ETHIBOND 2 0 SH (SUTURE)
SUT ETHIBOND 2 0 SH 36X2 (SUTURE) IMPLANT
SUT ETHIBOND 2 0 V4 (SUTURE) IMPLANT
SUT ETHIBOND 2 0V4 GREEN (SUTURE) IMPLANT
SUT ETHIBOND 4 0 RB 1 (SUTURE) IMPLANT
SUT ETHIBOND V-5 VALVE (SUTURE) IMPLANT
SUT MNCRL AB 4-0 PS2 18 (SUTURE) IMPLANT
SUT PROLENE 3 0 SH 1 (SUTURE) ×4 IMPLANT
SUT PROLENE 3 0 SH DA (SUTURE) IMPLANT
SUT PROLENE 3 0 SH1 36 (SUTURE) ×4 IMPLANT
SUT PROLENE 4 0 RB 1 (SUTURE) ×140
SUT PROLENE 4 0 SH DA (SUTURE) ×35 IMPLANT
SUT PROLENE 4-0 RB1 .5 CRCL 36 (SUTURE) ×3 IMPLANT
SUT PROLENE 5 0 C 1 36 (SUTURE) ×22 IMPLANT
SUT PROLENE 6 0 C 1 30 (SUTURE) ×2 IMPLANT
SUT PROLENE 6 0 CC (SUTURE) ×4 IMPLANT
SUT PROLENE 7 0 DA (SUTURE) IMPLANT
SUT PROLENE 7.0 RB 3 (SUTURE) ×15 IMPLANT
SUT PROLENE 8 0 BV175 6 (SUTURE) IMPLANT
SUT PROLENE BLUE 7 0 (SUTURE) ×5 IMPLANT
SUT PROLENE POLY MONO (SUTURE) ×6 IMPLANT
SUT SILK  1 MH (SUTURE) ×8
SUT SILK 1 MH (SUTURE) ×6 IMPLANT
SUT SILK 1 TIES 10X30 (SUTURE) ×5 IMPLANT
SUT SILK 2 0 (SUTURE) ×5
SUT SILK 2 0 SH CR/8 (SUTURE) ×10 IMPLANT
SUT SILK 2-0 18XBRD TIE 12 (SUTURE) ×3 IMPLANT
SUT SILK 3 0 SH CR/8 (SUTURE) ×5 IMPLANT
SUT SILK 4 0 (SUTURE) ×5
SUT SILK 4-0 18XBRD TIE 12 (SUTURE) ×3 IMPLANT
SUT STEEL 5 V 56 M (SUTURE) ×2 IMPLANT
SUT STEEL 6MS V (SUTURE) ×10 IMPLANT
SUT STEEL STERNAL CCS#1 18IN (SUTURE) IMPLANT
SUT STEEL SZ 6 DBL 3X14 BALL (SUTURE) ×5 IMPLANT
SUT TEM PAC WIRE 2 0 SH (SUTURE) ×20 IMPLANT
SUT VIC AB 1 CTX 18 (SUTURE) ×8 IMPLANT
SUT VIC AB 1 CTX 36 (SUTURE) ×20
SUT VIC AB 1 CTX36XBRD ANBCTR (SUTURE) ×6 IMPLANT
SUT VIC AB 2-0 CT1 27 (SUTURE) ×5
SUT VIC AB 2-0 CT1 TAPERPNT 27 (SUTURE) IMPLANT
SUT VIC AB 2-0 CTX 27 (SUTURE) ×14 IMPLANT
SUT VIC AB 3-0 SH 27 (SUTURE)
SUT VIC AB 3-0 SH 27X BRD (SUTURE) IMPLANT
SUT VIC AB 3-0 SH 8-18 (SUTURE) ×2 IMPLANT
SUT VIC AB 3-0 X1 27 (SUTURE) ×6 IMPLANT
SUT VICRYL 4-0 PS2 18IN ABS (SUTURE) IMPLANT
SUTURE E-PAK OPEN HEART (SUTURE) ×5 IMPLANT
SYR 10ML KIT SKIN ADHESIVE (MISCELLANEOUS) IMPLANT
SYSTEM SAHARA CHEST DRAIN ATS (WOUND CARE) ×5 IMPLANT
TAPE CLOTH SURG 4X10 WHT LF (GAUZE/BANDAGES/DRESSINGS) ×4 IMPLANT
TOWEL OR 17X24 6PK STRL BLUE (TOWEL DISPOSABLE) ×10 IMPLANT
TOWEL OR 17X26 10 PK STRL BLUE (TOWEL DISPOSABLE) ×10 IMPLANT
TRAY CATH LUMEN 1 20CM STRL (SET/KITS/TRAYS/PACK) ×4 IMPLANT
TRAY FOLEY IC TEMP SENS 14FR (CATHETERS) ×5 IMPLANT
TUBE SUCT INTRACARD DLP 20F (MISCELLANEOUS) ×2 IMPLANT
TUBING ART PRESS 48 MALE/FEM (TUBING) ×4 IMPLANT
TUBING INSUFFLATION 10FT LAP (TUBING) ×5 IMPLANT
UNDERPAD 30X30 INCONTINENT (UNDERPADS AND DIAPERS) ×5 IMPLANT
VENT LEFT HEART 12002 (CATHETERS) ×5
WATER STERILE IRR 1000ML POUR (IV SOLUTION) ×12 IMPLANT

## 2012-11-05 NOTE — Progress Notes (Signed)
Nursing: Heprin drip discontinued prior to going to operating waiting area. Pt transported with Zoll to operating area. No complications. Report given to CRNA. Family directed to waiting area.

## 2012-11-05 NOTE — Brief Op Note (Signed)
                   [address]            [place] [address]          [phone]    11/04/2012 - 11/05/2012  2:37 PM  PATIENT:  [name]  62 y.o. female  PRE-OPERATIVE DIAGNOSIS:  CAD  POST-OPERATIVE DIAGNOSIS:  CAD  PROCEDURE:  Procedure(s): CORONARY ARTERY BYPASS GRAFTING (CABG)X2 SVG-OM; SVG-LAD REPLACEMENT ASCENDING AORTA # 30 AORTO- AORTIC HEMASHIELD GRAFT REDO STERNOTOMY INTRAOPERATIVE TRANSESOPHAGEAL ECHOCARDIOGRAM ENDOVEIN HARVEST OF GREATER SAPHENOUS VEIN RIGHT THIGH  SURGEON:  Surgeon(s): Kerin Perna, MD  PHYSICIAN ASSISTANT: Cameshia Cressman PA-C  ANESTHESIA:   general  PATIENT CONDITION:  ICU - intubated and hemodynamically stable.  PRE-OPERATIVE WEIGHT: 77.9kg  COMPLICATIONS: NO KNOWN

## 2012-11-05 NOTE — Preoperative (Signed)
Beta Blockers   Reason not to administer Beta Blockers:Not Applicable 

## 2012-11-05 NOTE — OR Nursing (Signed)
Dr Maren Beach start time - 0908.

## 2012-11-05 NOTE — Anesthesia Preprocedure Evaluation (Signed)
Anesthesia Evaluation  Patient identified by MRN, date of birth, ID band Patient awake    History of Anesthesia Complications Negative for: history of anesthetic complications  Airway Mallampati: I      Dental   Pulmonary shortness of breath,  breath sounds clear to auscultation        Cardiovascular hypertension, + angina + CAD and + Peripheral Vascular Disease Rhythm:Regular Rate:Normal     Neuro/Psych    GI/Hepatic negative GI ROS, Neg liver ROS,   Endo/Other  negative endocrine ROS  Renal/GU negative Renal ROS     Musculoskeletal   Abdominal   Peds  Hematology negative hematology ROS (+)   Anesthesia Other Findings   Reproductive/Obstetrics                           Anesthesia Physical Anesthesia Plan  ASA: III  Anesthesia Plan: General   Post-op Pain Management:    Induction: Intravenous  Airway Management Planned: Oral ETT  Additional Equipment: Arterial line, PA Cath, TEE and Ultrasound Guidance Line Placement  Intra-op Plan:   Post-operative Plan: Post-operative intubation/ventilation  Informed Consent: I have reviewed the patients History and Physical, chart, labs and discussed the procedure including the risks, benefits and alternatives for the proposed anesthesia with the patient or authorized representative who has indicated his/her understanding and acceptance.   Dental advisory given  Plan Discussed with: CRNA and Surgeon  Anesthesia Plan Comments:         Anesthesia Quick Evaluation

## 2012-11-05 NOTE — Progress Notes (Signed)
TCTS  The patient was examined and preop studies reviewed. There has been no change from the prior exam and the patient is ready for surgery.  Plan CABG and replacement of ascending aorta on D Konkel today using circ arrest

## 2012-11-05 NOTE — Anesthesia Procedure Notes (Signed)
Procedure Name: Intubation Date/Time: 11/05/2012 7:55 AM Performed by: Carmela Rima Pre-anesthesia Checklist: Emergency Drugs available, Patient identified, Timeout performed, Suction available and Patient being monitored Patient Re-evaluated:Patient Re-evaluated prior to inductionOxygen Delivery Method: Circle system utilized Preoxygenation: Pre-oxygenation with 100% oxygen Intubation Type: IV induction Ventilation: Mask ventilation without difficulty Laryngoscope Size: Mac and 3 Grade View: Grade I Tube type: Oral Tube size: 8.0 mm Number of attempts: 1 Placement Confirmation: ETT inserted through vocal cords under direct vision,  positive ETCO2 and breath sounds checked- equal and bilateral Secured at: 23 cm Tube secured with: Tape Dental Injury: Teeth and Oropharynx as per pre-operative assessment

## 2012-11-05 NOTE — Transfer of Care (Signed)
Immediate Anesthesia Transfer of Care Note  Patient: [name]  Procedure(s) Performed: Procedure(s) (LRB) with comments: CORONARY ARTERY BYPASS GRAFTING (CABG) (N/A) - cannulate right subclavian REPLACEMENT ASCENDING AORTA (N/A) REDO STERNOTOMY (N/A) INTRAOPERATIVE TRANSESOPHAGEAL ECHOCARDIOGRAM (N/A) ENDOVEIN HARVEST OF GREATER SAPHENOUS VEIN (Right)  Patient Location: SICU  Anesthesia Type:General  Level of Consciousness: Patient remains intubated per anesthesia plan  Airway & Oxygen Therapy: Patient remains intubated per anesthesia plan and Patient placed on Ventilator (see vital sign flow sheet for setting)  Post-op Assessment: Report given to PACU RN and Post -op Vital signs reviewed and stable  Post vital signs: Reviewed and stable  Complications: No apparent anesthesia complications

## 2012-11-06 ENCOUNTER — Inpatient Hospital Stay (HOSPITAL_COMMUNITY): Payer: PRIVATE HEALTH INSURANCE

## 2012-11-06 ENCOUNTER — Encounter (HOSPITAL_COMMUNITY): Payer: Self-pay | Admitting: Cardiothoracic Surgery

## 2012-11-06 LAB — BASIC METABOLIC PANEL
BUN: 14 mg/dL (ref 6–23)
CO2: 27 mEq/L (ref 19–32)
Chloride: 110 mEq/L (ref 96–112)
Creatinine, Ser: 0.78 mg/dL (ref 0.50–1.10)
Glucose, Bld: 129 mg/dL — ABNORMAL HIGH (ref 70–99)

## 2012-11-06 LAB — POCT I-STAT 3, ART BLOOD GAS (G3+)
Acid-Base Excess: 1 mmol/L (ref 0.0–2.0)
Bicarbonate: 26 mEq/L — ABNORMAL HIGH (ref 20.0–24.0)
O2 Saturation: 95 %
Patient temperature: 36
TCO2: 28 mmol/L (ref 0–100)
pCO2 arterial: 44 mmHg (ref 35.0–45.0)
pO2, Arterial: 77 mmHg — ABNORMAL LOW (ref 80.0–100.0)

## 2012-11-06 LAB — CBC
HCT: 26.6 % — ABNORMAL LOW (ref 36.0–46.0)
Hemoglobin: 10.9 g/dL — ABNORMAL LOW (ref 12.0–15.0)
MCV: 78.5 fL (ref 78.0–100.0)
RBC: 3.39 MIL/uL — ABNORMAL LOW (ref 3.87–5.11)
RBC: 4.01 MIL/uL (ref 3.87–5.11)
WBC: 11.1 10*3/uL — ABNORMAL HIGH (ref 4.0–10.5)
WBC: 16.2 10*3/uL — ABNORMAL HIGH (ref 4.0–10.5)

## 2012-11-06 LAB — GLUCOSE, CAPILLARY
Glucose-Capillary: 101 mg/dL — ABNORMAL HIGH (ref 70–99)
Glucose-Capillary: 107 mg/dL — ABNORMAL HIGH (ref 70–99)
Glucose-Capillary: 128 mg/dL — ABNORMAL HIGH (ref 70–99)
Glucose-Capillary: 130 mg/dL — ABNORMAL HIGH (ref 70–99)
Glucose-Capillary: 91 mg/dL (ref 70–99)

## 2012-11-06 LAB — PREPARE PLATELET PHERESIS
Unit division: 0
Unit division: 0

## 2012-11-06 LAB — MAGNESIUM: Magnesium: 2.5 mg/dL (ref 1.5–2.5)

## 2012-11-06 LAB — CREATININE, SERUM
Creatinine, Ser: 0.74 mg/dL (ref 0.50–1.10)
GFR calc Af Amer: >90 mL/min (ref >90)
GFR calc non Af Amer: >90 mL/min (ref >90)

## 2012-11-06 LAB — POCT I-STAT, CHEM 8
Creatinine, Ser: 0.8 mg/dL (ref 0.50–1.10)
Glucose, Bld: 137 mg/dL — ABNORMAL HIGH (ref 70–99)
Hemoglobin: 11.2 g/dL — ABNORMAL LOW (ref 12.0–15.0)
Potassium: 3.9 mEq/L (ref 3.5–5.1)

## 2012-11-06 LAB — PREPARE FRESH FROZEN PLASMA

## 2012-11-06 MED ORDER — POTASSIUM CHLORIDE 10 MEQ/50ML IV SOLN
10.0000 meq | INTRAVENOUS | Status: AC
  Administered 2012-11-06 (×2): 10 meq via INTRAVENOUS

## 2012-11-06 MED ORDER — VANCOMYCIN HCL IN DEXTROSE 1-5 GM/200ML-% IV SOLN
1000.0000 mg | Freq: Two times a day (BID) | INTRAVENOUS | Status: AC
  Administered 2012-11-06 – 2012-11-07 (×2): 1000 mg via INTRAVENOUS
  Filled 2012-11-06 (×3): qty 200

## 2012-11-06 MED ORDER — POTASSIUM CHLORIDE 10 MEQ/50ML IV SOLN
10.0000 meq | INTRAVENOUS | Status: AC
  Administered 2012-11-06 (×3): 10 meq via INTRAVENOUS
  Filled 2012-11-06: qty 150

## 2012-11-06 MED ORDER — LABETALOL HCL 5 MG/ML IV SOLN
10.0000 mg | INTRAVENOUS | Status: DC | PRN
  Filled 2012-11-06: qty 4

## 2012-11-06 MED ORDER — INSULIN DETEMIR 100 UNIT/ML ~~LOC~~ SOLN
10.0000 [IU] | Freq: Two times a day (BID) | SUBCUTANEOUS | Status: DC
  Administered 2012-11-06 – 2012-11-08 (×5): 10 [IU] via SUBCUTANEOUS

## 2012-11-06 MED ORDER — FUROSEMIDE 10 MG/ML IJ SOLN
6.0000 mg/h | INTRAVENOUS | Status: DC
  Administered 2012-11-06 – 2012-11-07 (×2): 6 mg/h via INTRAVENOUS
  Filled 2012-11-06 (×3): qty 25

## 2012-11-06 MED ORDER — NALOXONE HCL 0.4 MG/ML IJ SOLN
INTRAMUSCULAR | Status: AC
  Filled 2012-11-06: qty 1

## 2012-11-06 MED ORDER — AMIODARONE HCL IN DEXTROSE 360-4.14 MG/200ML-% IV SOLN: 30.0000 mg/h | INTRAVENOUS | Status: AC

## 2012-11-06 MED ORDER — TRAMADOL HCL 50 MG PO TABS
50.0000 mg | ORAL_TABLET | Freq: Four times a day (QID) | ORAL | Status: DC | PRN
  Filled 2012-11-06: qty 1

## 2012-11-06 MED ORDER — INSULIN ASPART 100 UNIT/ML ~~LOC~~ SOLN
0.0000 [IU] | SUBCUTANEOUS | Status: DC
  Administered 2012-11-06 – 2012-11-07 (×4): 2 [IU] via SUBCUTANEOUS

## 2012-11-06 MED ORDER — METOCLOPRAMIDE HCL 5 MG/ML IJ SOLN
10.0000 mg | Freq: Four times a day (QID) | INTRAMUSCULAR | Status: AC
  Administered 2012-11-06 – 2012-11-07 (×4): 10 mg via INTRAVENOUS
  Filled 2012-11-06 (×4): qty 2

## 2012-11-06 MED ORDER — POTASSIUM CHLORIDE 10 MEQ/50ML IV SOLN
INTRAVENOUS | Status: AC
  Administered 2012-11-06: 10 meq via INTRAVENOUS
  Filled 2012-11-06: qty 50

## 2012-11-06 MED FILL — Mannitol IV Soln 20%: INTRAVENOUS | Qty: 500 | Status: AC

## 2012-11-06 MED FILL — Electrolyte-R (PH 7.4) Solution: INTRAVENOUS | Qty: 6000 | Status: AC

## 2012-11-06 MED FILL — Lidocaine HCl IV Inj 20 MG/ML: INTRAVENOUS | Qty: 5 | Status: AC

## 2012-11-06 MED FILL — Calcium Chloride Inj 10%: INTRAVENOUS | Qty: 10 | Status: AC

## 2012-11-06 MED FILL — Sodium Bicarbonate IV Soln 8.4%: INTRAVENOUS | Qty: 50 | Status: AC

## 2012-11-06 MED FILL — Sodium Chloride IV Soln 0.9%: INTRAVENOUS | Qty: 3000 | Status: AC

## 2012-11-06 MED FILL — Potassium Chloride Inj 2 mEq/ML: INTRAVENOUS | Qty: 40 | Status: AC

## 2012-11-06 MED FILL — Coagulation Factor VIIa (Recomb) For Inj 1 MG (1000 MCG): INTRAVENOUS | Qty: 4 | Status: AC

## 2012-11-06 MED FILL — Magnesium Sulfate Inj 50%: INTRAMUSCULAR | Qty: 10 | Status: AC

## 2012-11-06 MED FILL — Heparin Sodium (Porcine) Inj 1000 Unit/ML: INTRAMUSCULAR | Qty: 10 | Status: AC

## 2012-11-06 MED FILL — Heparin Sodium (Porcine) Inj 1000 Unit/ML: INTRAMUSCULAR | Qty: 90 | Status: AC

## 2012-11-06 MED FILL — Albumin, Human Inj 5%: INTRAVENOUS | Qty: 250 | Status: AC

## 2012-11-06 NOTE — Progress Notes (Signed)
[facility]  DAILY PROGRESS NOTE   Subjective:  POD #1. Extubated this am. Sleepy but arousable.   Objective:  Temp:  [95.9 F (35.5 C)-99.3 F (37.4 C)] 97.2 F (36.2 C) (02/05 0914) Pulse Rate:  [70-99] 88  (02/05 0914) Resp:  [0-30] 18  (02/05 0914) BP: (82-137)/(45-68) 96/54 mmHg (02/05 0800) SpO2:  [95 %-100 %] 95 % (02/05 0914) Arterial Line BP: (84-172)/(43-87) 143/58 mmHg (02/05 0914) FiO2 (%):  [40 %-50 %] 40 % (02/05 1610) Weight change:   Intake/Output from previous day: 02/04 0701 - 02/05 0700 In: 9565.4 [I.V.:3328.4; RUEAV:4098; IV Piggyback:2200] Out: 6790 [Urine:2390; Blood:4000; Chest Tube:400]  Intake/Output from this shift: Total I/O In: 69.8 [I.V.:69.8] Out: -   Medications: Current Facility-Administered Medications  Medication Dose Route Frequency Provider Last Rate Last Dose  . 0.9 %  sodium chloride infusion   Intravenous Continuous Rowe Clack, PA 20 mL/hr at 11/06/12 0100    . 0.9 %  sodium chloride infusion  250 mL Intravenous PRN Rowe Clack, PA      . acetaminophen (TYLENOL) tablet 1,000 mg  1,000 mg Oral Q6H Wayne E Gold, PA       Or  . acetaminophen (TYLENOL) solution 975 mg  975 mg Per Tube Q6H Rowe Clack, PA   975 mg at 11/06/12 0604  . albumin human 5 % solution 250 mL  250 mL Intravenous Q15 min PRN Rowe Clack, PA   250 mL at 11/06/12 0033  . amiodarone (NEXTERONE PREMIX) 360 mg/200 mL dextrose IV infusion  30 mg/hr Intravenous Continuous Kerin Perna, MD      . aspirin EC tablet 325 mg  325 mg Oral Daily Rowe Clack, [place]       Or  . aspirin chewable tablet 324 mg  324 mg Per Tube Daily Rowe Clack, PA      . atorvastatin (LIPITOR) tablet 40 mg  40 mg Oral q1800 Rowe Clack, PA      . bisacodyl (DULCOLAX) EC tablet 10 mg  10 mg Oral Daily Rowe Clack, PA       Or  . bisacodyl (DULCOLAX) suppository 10 mg  10 mg Rectal Daily Wayne E Gold, PA      . cefUROXime (ZINACEF) 1.5 g in dextrose 5 % 50 mL  IVPB  1.5 g Intravenous Q12H Wayne E Gold, PA   1.5 g at 11/05/12 2346  . docusate sodium (COLACE) capsule 200 mg  200 mg Oral Daily Rowe Clack, [place]      . DOPamine (INTROPIN) 800 mg in dextrose 5 % 250 mL infusion  0-10 mcg/kg/min Intravenous Continuous Rowe Clack, PA 7.3 mL/hr at 11/05/12 1930 5 mcg/kg/min at 11/05/12 1930  . DOPamine (INTROPIN) 800 mg in dextrose 5 % 250 mL infusion  0-5 mcg/kg/min Intravenous Titrated Ardelle Balls, PA 7.3 mL/hr at 11/06/12 0800 5 mcg/kg/min at 11/06/12 0800  . famotidine (PEPCID) IVPB 20 mg  20 mg Intravenous Q12H Rowe Clack, PA   20 mg at 11/05/12 1930  . furosemide (LASIX) 250 mg in dextrose 5 % 250 mL infusion  6 mg/hr Intravenous Continuous Kerin Perna, MD      . insulin aspart (novoLOG) injection 0-24 Units  0-24 Units Subcutaneous Q4H Kerin Perna, MD      . insulin detemir (LEVEMIR) injection 10 Units  10 Units Subcutaneous BID Kerin Perna, MD      .  metoCLOPramide (REGLAN) injection 10 mg  10 mg Intravenous Q6H Kerin Perna, MD      . metoprolol [facility]) injection 2.5-5 mg  2.5-5 mg Intravenous Q2H PRN Rowe Clack, PA      . metoprolol tartrate (LOPRESSOR) tablet 12.5 mg  12.5 mg Oral BID Rowe Clack, PA       Or  . metoprolol tartrate (LOPRESSOR) 25 mg/10 mL oral suspension 12.5 mg  12.5 mg Per Tube BID Rowe Clack, PA   12.5 mg at 11/05/12 2114  . milrinone (PRIMACOR) infusion 200 mcg/mL (0.2 mg/ml)  0.375 mcg/kg/min Intravenous Continuous Ardelle Balls, PA 8.8 mL/hr at 11/06/12 0700 0.375 mcg/kg/min at 11/06/12 0700  . morphine 2 MG/ML injection 2-5 mg  2-5 mg Intravenous Q1H PRN Rowe Clack, PA   2 mg at 11/06/12 0914  . naphazoline-pheniramine (NAPHCON-A) 0.025-0.3 % ophthalmic solution 1 drop  1 drop Both Eyes QID PRN Rowe Clack, PA      . ondansetron [facility]) injection 4 mg  4 mg Intravenous Q6H PRN Rowe Clack, PA   4 mg at 11/06/12 0272  . oxyCODONE (Oxy IR/ROXICODONE) immediate release tablet 5-10 mg   5-10 mg Oral Q3H PRN Rowe Clack, PA      . pantoprazole (PROTONIX) EC tablet 40 mg  40 mg Oral Daily Wayne E Gold, PA      . phenylephrine (NEO-SYNEPHRINE) 20,000 mcg in dextrose 5 % 250 mL infusion  0-100 mcg/min Intravenous Continuous Rowe Clack, PA 11.3 mL/hr at 11/06/12 0628 15 mcg/min at 11/06/12 5366  . potassium chloride 10 mEq in 50 mL *CENTRAL LINE* IVPB  10 mEq Intravenous Q1 Hr x 2 Kerin Perna, MD      . potassium chloride 10 MEQ/50ML IVPB           . sodium chloride 0.9 % injection 3 mL  3 mL Intravenous Q12H Wayne E Gold, PA      . sodium chloride 0.9 % injection 3 mL  3 mL Intravenous PRN Rowe Clack, PA      . traMADol [name]) tablet 50 mg  50 mg Oral Q6H PRN Kerin Perna, MD      . vancomycin (VANCOCIN) IVPB 1000 mg/200 mL premix  1,000 mg Intravenous Q12H Kerin Perna, MD        Physical Exam: General appearance: Awakens to voice, follows commands but sleepy Neck: no adenopathy, no carotid bruit, supple, symmetrical, trachea midline and thyroid not enlarged, symmetric, no tenderness/mass/nodules Lungs: diminished breath sounds bibasilar Heart: regular rate and rhythm, S1, S2 normal, no murmur, click, rub or gallop, sharp mechanical valve sound Abdomen: soft, non-tender; bowel sounds normal; no masses,  no organomegaly Extremities: edema 2+ Pulses: 2+ and symmetric Skin: Skin color, texture, turgor normal. No rashes or lesions Neurologic: Mental status: Awakens to voice, oriented, sleepy  Lab Results: Results for orders placed during the hospital encounter of 11/04/12 (from the past 48 hour(s))  SURGICAL PCR SCREEN     Status: Normal   Collection Time   11/04/12 12:41 PM      Component Value Range Comment   MRSA, PCR NEGATIVE  NEGATIVE    Staphylococcus aureus NEGATIVE  NEGATIVE   HEMOGLOBIN A1C     Status: Abnormal   Collection Time   11/04/12  1:15 PM      Component Value Range Comment   Hemoglobin A1C 6.1 (*) <5.7 %    Mean Plasma Glucose 128 (*) <117  mg/dL   TYPE AND SCREEN     Status: Normal (Preliminary result)   Collection Time   11/04/12  1:15 PM      Component Value Range Comment   ABO/RH(D) O POS      Antibody Screen NEG      Sample Expiration 11/07/2012      Unit Number [id number]      Blood Component Type RBC LR PHER2      Unit division 00      Status of Unit ISSUED,FINAL      Transfusion Status OK TO TRANSFUSE      Crossmatch Result Compatible      Unit Number [id number]      Blood Component Type RBC LR PHER2      Unit division 00      Status of Unit ISSUED,FINAL      Transfusion Status OK TO TRANSFUSE      Crossmatch Result Compatible      Unit Number [id number]      Blood Component Type RBC LR PHER2      Unit division 00      Status of Unit ISSUED,FINAL      Transfusion Status OK TO TRANSFUSE      Crossmatch Result Compatible      Unit Number [id number]      Blood Component Type RBC LR PHER1      Unit division 00      Status of Unit ISSUED,FINAL      Transfusion Status OK TO TRANSFUSE      Crossmatch Result Compatible      Unit Number [id number]      Blood Component Type RED CELLS,LR      Unit division 00      Status of Unit ALLOCATED      Transfusion Status OK TO TRANSFUSE      Crossmatch Result Compatible      Unit Number [id number]      Blood Component Type RBC LR PHER2      Unit division 00      Status of Unit ALLOCATED      Transfusion Status OK TO TRANSFUSE      Crossmatch Result Compatible      Unit Number [id number]      Blood Component Type RED CELLS,LR      Unit division 00      Status of Unit ALLOCATED      Transfusion Status OK TO TRANSFUSE      Crossmatch Result Compatible      Unit Number [id number]      Blood Component Type RED CELLS,LR      Unit division 00      Status of Unit ALLOCATED      Transfusion Status OK TO TRANSFUSE      Crossmatch Result Compatible      Unit Number [id number]      Blood Component Type RED CELLS,LR      Unit division 00       Status of Unit ALLOCATED      Transfusion Status OK TO TRANSFUSE      Crossmatch Result Compatible      Unit Number [id number]      Blood Component Type RED CELLS,LR      Unit division 00      Status of Unit ALLOCATED      Transfusion Status OK TO TRANSFUSE      Crossmatch Result Compatible      Unit Number [id number]  Blood Component Type RBC LR PHER1      Unit division 00      Status of Unit ALLOCATED      Transfusion Status OK TO TRANSFUSE      Crossmatch Result Compatible      Unit Number [id number]      Blood Component Type RED CELLS,LR      Unit division 00      Status of Unit ALLOCATED      Transfusion Status OK TO TRANSFUSE      Crossmatch Result Compatible     ABO/RH     Status: Normal   Collection Time   11/04/12  1:15 PM      Component Value Range Comment   ABO/RH(D) O POS     PREPARE RBC (CROSSMATCH)     Status: Normal   Collection Time   11/04/12  8:00 PM      Component Value Range Comment   Order Confirmation ORDER PROCESSED BY BLOOD BANK     URINALYSIS, ROUTINE W REFLEX MICROSCOPIC     Status: Normal   Collection Time   11/04/12 10:27 PM      Component Value Range Comment   Color, Urine YELLOW  YELLOW    APPearance CLEAR  CLEAR    Specific Gravity, Urine 1.010  1.005 - 1.030    pH 5.0  5.0 - 8.0    Glucose, UA NEGATIVE  NEGATIVE mg/dL    Hgb urine dipstick NEGATIVE  NEGATIVE    Bilirubin Urine NEGATIVE  NEGATIVE    Ketones, ur NEGATIVE  NEGATIVE mg/dL    Protein, ur NEGATIVE  NEGATIVE mg/dL    Urobilinogen, UA 0.2  0.0 - 1.0 mg/dL    Nitrite NEGATIVE  NEGATIVE    Leukocytes, UA NEGATIVE  NEGATIVE MICROSCOPIC NOT DONE ON URINES WITH NEGATIVE PROTEIN, BLOOD, LEUKOCYTES, NITRITE, OR GLUCOSE <1000 mg/dL.  HEPARIN LEVEL (UNFRACTIONATED)     Status: Normal   Collection Time   11/05/12  5:20 AM      Component Value Range Comment   Heparin Unfractionated 0.50  0.30 - 0.70 IU/mL   CBC     Status: Abnormal   Collection Time   11/05/12  5:20 AM       Component Value Range Comment   WBC 10.9 (*) 4.0 - 10.5 K/uL    RBC 5.31 (*) 3.87 - 5.11 MIL/uL    Hemoglobin 14.2  12.0 - 15.0 g/dL    HCT 11.9  14.7 - 82.9 %    MCV 77.4 (*) 78.0 - 100.0 fL    MCH 26.7  26.0 - 34.0 pg    MCHC 34.5  30.0 - 36.0 g/dL    RDW 56.2  13.0 - 86.5 %    Platelets 223  150 - 400 K/uL   BASIC METABOLIC PANEL     Status: Abnormal   Collection Time   11/05/12  5:20 AM      Component Value Range Comment   Sodium 141  135 - 145 mEq/L    Potassium 3.8  3.5 - 5.1 mEq/L    Chloride 106  96 - 112 mEq/L    CO2 25  19 - 32 mEq/L    Glucose, Bld 145 (*) 70 - 99 mg/dL    BUN 12  6 - 23 mg/dL    Creatinine, Ser 7.84  0.50 - 1.10 mg/dL    Calcium 9.9  8.4 - 69.6 mg/dL    GFR calc non Af Amer 89 (*) >  90 mL/min    GFR calc Af Amer >90  >90 mL/min   POCT I-STAT 4, (NA,K, GLUC, HGB,HCT)     Status: Abnormal   Collection Time   11/05/12  8:06 AM      Component Value Range Comment   Sodium 141  135 - 145 mEq/L    Potassium 3.7  3.5 - 5.1 mEq/L    Glucose, Bld 126 (*) 70 - 99 mg/dL    HCT 16.1  09.6 - 04.5 %    Hemoglobin 14.3  12.0 - 15.0 g/dL   POCT I-STAT 3, BLOOD GAS (G3+)     Status: Abnormal   Collection Time   11/05/12  8:10 AM      Component Value Range Comment   pH, Arterial 7.371  7.350 - 7.450    pCO2 arterial 42.5  35.0 - 45.0 mmHg    pO2, Arterial 506.0 (*) 80.0 - 100.0 mmHg    Bicarbonate 24.6 (*) 20.0 - 24.0 mEq/L    TCO2 26  0 - 100 mmol/L    O2 Saturation 100.0      Acid-base deficit 1.0  0.0 - 2.0 mmol/L    Sample type ARTERIAL     POCT I-STAT 4, (NA,K, GLUC, HGB,HCT)     Status: Abnormal   Collection Time   11/05/12 10:22 AM      Component Value Range Comment   Sodium 142  135 - 145 mEq/L    Potassium 4.1  3.5 - 5.1 mEq/L    Glucose, Bld 128 (*) 70 - 99 mg/dL    HCT 40.9  81.1 - 91.4 %    Hemoglobin 12.2  12.0 - 15.0 g/dL   POCT I-STAT 4, (NA,K, GLUC, HGB,HCT)     Status: Abnormal   Collection Time   11/05/12 10:31 AM      Component Value Range  Comment   Sodium 142  135 - 145 mEq/L    Potassium 3.7  3.5 - 5.1 mEq/L    Glucose, Bld 105 (*) 70 - 99 mg/dL    HCT 78.2 (*) 95.6 - 46.0 %    Hemoglobin 8.8 (*) 12.0 - 15.0 g/dL   POCT I-STAT 3, BLOOD GAS (G3+)     Status: Abnormal   Collection Time   11/05/12 10:35 AM      Component Value Range Comment   pH, Arterial 7.501 (*) 7.350 - 7.450    pCO2 arterial 33.8 (*) 35.0 - 45.0 mmHg    pO2, Arterial 432.0 (*) 80.0 - 100.0 mmHg    Bicarbonate 26.4 (*) 20.0 - 24.0 mEq/L    TCO2 27  0 - 100 mmol/L    O2 Saturation 100.0      Acid-Base Excess 3.0 (*) 0.0 - 2.0 mmol/L    Sample type ARTERIAL     POCT I-STAT 4, (NA,K, GLUC, HGB,HCT)     Status: Abnormal   Collection Time   11/05/12 11:32 AM      Component Value Range Comment   Sodium 139  135 - 145 mEq/L    Potassium 3.3 (*) 3.5 - 5.1 mEq/L    Glucose, Bld 126 (*) 70 - 99 mg/dL    HCT 21.3 (*) 08.6 - 46.0 %    Hemoglobin 8.8 (*) 12.0 - 15.0 g/dL   POCT I-STAT 4, (NA,K, GLUC, HGB,HCT)     Status: Abnormal   Collection Time   11/05/12 12:22 PM      Component Value Range Comment   Sodium 137  135 - 145 mEq/L    Potassium 3.7  3.5 - 5.1 mEq/L    Glucose, Bld 91  70 - 99 mg/dL    HCT 11.9 (*) 14.7 - 46.0 %    Hemoglobin 7.5 (*) 12.0 - 15.0 g/dL   POCT I-STAT 4, (NA,K, GLUC, HGB,HCT)     Status: Abnormal   Collection Time   11/05/12  2:05 PM      Component Value Range Comment   Sodium 137  135 - 145 mEq/L    Potassium 4.3  3.5 - 5.1 mEq/L    Glucose, Bld 100 (*) 70 - 99 mg/dL    HCT 82.9 (*) 56.2 - 46.0 %    Hemoglobin 6.8 (*) 12.0 - 15.0 g/dL   PLATELET COUNT     Status: Abnormal   Collection Time   11/05/12  2:30 PM      Component Value Range Comment   Platelets 50 (*) 150 - 400 K/uL   HEMOGLOBIN AND HEMATOCRIT, BLOOD     Status: Abnormal   Collection Time   11/05/12  2:30 PM      Component Value Range Comment   Hemoglobin 8.8 (*) 12.0 - 15.0 g/dL    HCT 13.0 (*) 86.5 - 46.0 %   PREPARE FRESH FROZEN PLASMA     Status: Normal    Collection Time   11/05/12  2:34 PM      Component Value Range Comment   Unit Number [id number]      Blood Component Type THAWED PLASMA      Unit division 00      Status of Unit ISSUED,FINAL      Transfusion Status OK TO TRANSFUSE      Unit Number [id number]      Blood Component Type THAWED PLASMA      Unit division 00      Status of Unit ISSUED,FINAL      Transfusion Status OK TO TRANSFUSE     PREPARE PLATELET PHERESIS     Status: Normal   Collection Time   11/05/12  2:34 PM      Component Value Range Comment   Unit Number [id number]      Blood Component Type PLTPHER LR2      Unit division 00      Status of Unit ISSUED,FINAL      Transfusion Status OK TO TRANSFUSE      Unit Number [id number]      Blood Component Type PLTPHER LR1      Unit division 00      Status of Unit ISSUED,FINAL      Transfusion Status OK TO TRANSFUSE      Unit Number [id number]      Blood Component Type PLTPHER LR1      Unit division 00      Status of Unit ISSUED,FINAL      Transfusion Status OK TO TRANSFUSE     POCT I-STAT 4, (NA,K, GLUC, HGB,HCT)     Status: Abnormal   Collection Time   11/05/12  3:17 PM      Component Value Range Comment   Sodium 140  135 - 145 mEq/L    Potassium 4.2  3.5 - 5.1 mEq/L    Glucose, Bld 133 (*) 70 - 99 mg/dL    HCT 09.3 (*) 23.5 - 46.0 %    Hemoglobin 8.5 (*) 12.0 - 15.0 g/dL   POCT I-STAT 4, (NA,K, GLUC, HGB,HCT)     Status: Abnormal  Collection Time   11/05/12  4:13 PM      Component Value Range Comment   Sodium 141  135 - 145 mEq/L    Potassium 3.8  3.5 - 5.1 mEq/L    Glucose, Bld 229 (*) 70 - 99 mg/dL    HCT 16.1 (*) 09.6 - 46.0 %    Hemoglobin 8.2 (*) 12.0 - 15.0 g/dL   POCT I-STAT 3, BLOOD GAS (G3+)     Status: Abnormal   Collection Time   11/05/12  4:17 PM      Component Value Range Comment   pH, Arterial 7.320 (*) 7.350 - 7.450    pCO2 arterial 37.3  35.0 - 45.0 mmHg    pO2, Arterial 104.0 (*) 80.0 - 100.0 mmHg    Bicarbonate 19.2 (*) 20.0 -  24.0 mEq/L    TCO2 20  0 - 100 mmol/L    O2 Saturation 98.0      Acid-base deficit 6.0 (*) 0.0 - 2.0 mmol/L    Sample type ARTERIAL     POCT I-STAT 4, (NA,K, GLUC, HGB,HCT)     Status: Abnormal   Collection Time   11/05/12  4:59 PM      Component Value Range Comment   Sodium 145  135 - 145 mEq/L    Potassium 3.6  3.5 - 5.1 mEq/L    Glucose, Bld 210 (*) 70 - 99 mg/dL    HCT 04.5 (*) 40.9 - 46.0 %    Hemoglobin 8.2 (*) 12.0 - 15.0 g/dL   POCT I-STAT 3, BLOOD GAS (G3+)     Status: Abnormal   Collection Time   11/05/12  5:02 PM      Component Value Range Comment   pH, Arterial 7.308 (*) 7.350 - 7.450    pCO2 arterial 46.6 (*) 35.0 - 45.0 mmHg    pO2, Arterial 133.0 (*) 80.0 - 100.0 mmHg    Bicarbonate 23.4  20.0 - 24.0 mEq/L    TCO2 25  0 - 100 mmol/L    O2 Saturation 99.0      Acid-base deficit 3.0 (*) 0.0 - 2.0 mmol/L    Sample type ARTERIAL     CBC     Status: Abnormal   Collection Time   11/05/12  7:20 PM      Component Value Range Comment   WBC 16.0 (*) 4.0 - 10.5 K/uL    RBC 3.62 (*) 3.87 - 5.11 MIL/uL    Hemoglobin 10.2 (*) 12.0 - 15.0 g/dL POST TRANSFUSION SPECIMEN   HCT 28.6 (*) 36.0 - 46.0 %    MCV 79.0  78.0 - 100.0 fL    MCH 28.2  26.0 - 34.0 pg    MCHC 35.7  30.0 - 36.0 g/dL    RDW 81.1  91.4 - 78.2 %    Platelets 153  150 - 400 K/uL POST TRANSFUSION SPECIMEN  PROTIME-INR     Status: Normal   Collection Time   11/05/12  7:20 PM      Component Value Range Comment   Prothrombin Time 12.6  11.6 - 15.2 seconds    INR 0.95  0.00 - 1.49   APTT     Status: Normal   Collection Time   11/05/12  7:20 PM      Component Value Range Comment   aPTT 36  24 - 37 seconds   POCT I-STAT 4, (NA,K, GLUC, HGB,HCT)     Status: Abnormal   Collection Time   11/05/12  7:21 PM      Component Value Range Comment   Sodium 144  135 - 145 mEq/L    Potassium 3.3 (*) 3.5 - 5.1 mEq/L    Glucose, Bld 187 (*) 70 - 99 mg/dL    HCT 95.6 (*) 21.3 - 46.0 %    Hemoglobin 9.5 (*) 12.0 - 15.0 g/dL    POCT I-STAT 3, BLOOD GAS (G3+)     Status: Abnormal   Collection Time   11/05/12  7:21 PM      Component Value Range Comment   pH, Arterial 7.396  7.350 - 7.450    pCO2 arterial 38.9  35.0 - 45.0 mmHg    pO2, Arterial 70.0 (*) 80.0 - 100.0 mmHg    Bicarbonate 24.2 (*) 20.0 - 24.0 mEq/L    TCO2 25  0 - 100 mmol/L    O2 Saturation 95.0      Acid-base deficit 1.0  0.0 - 2.0 mmol/L    Patient temperature 35.6 C      Collection site RADIAL, ALLEN'S TEST ACCEPTABLE      Sample type ARTERIAL     GLUCOSE, CAPILLARY     Status: Abnormal   Collection Time   11/05/12  8:03 PM      Component Value Range Comment   Glucose-Capillary 166 (*) 70 - 99 mg/dL   GLUCOSE, CAPILLARY     Status: Abnormal   Collection Time   11/05/12  9:03 PM      Component Value Range Comment   Glucose-Capillary 176 (*) 70 - 99 mg/dL   GLUCOSE, CAPILLARY     Status: Abnormal   Collection Time   11/05/12  9:53 PM      Component Value Range Comment   Glucose-Capillary 132 (*) 70 - 99 mg/dL   GLUCOSE, CAPILLARY     Status: Abnormal   Collection Time   11/05/12 10:55 PM      Component Value Range Comment   Glucose-Capillary 124 (*) 70 - 99 mg/dL   GLUCOSE, CAPILLARY     Status: Abnormal   Collection Time   11/05/12 11:59 PM      Component Value Range Comment   Glucose-Capillary 130 (*) 70 - 99 mg/dL   GLUCOSE, CAPILLARY     Status: Abnormal   Collection Time   11/06/12 12:56 AM      Component Value Range Comment   Glucose-Capillary 128 (*) 70 - 99 mg/dL   GLUCOSE, CAPILLARY     Status: Abnormal   Collection Time   11/06/12  1:57 AM      Component Value Range Comment   Glucose-Capillary 156 (*) 70 - 99 mg/dL   GLUCOSE, CAPILLARY     Status: Abnormal   Collection Time   11/06/12  2:55 AM      Component Value Range Comment   Glucose-Capillary 131 (*) 70 - 99 mg/dL   GLUCOSE, CAPILLARY     Status: Abnormal   Collection Time   11/06/12  3:53 AM      Component Value Range Comment   Glucose-Capillary 120 (*) 70 - 99 mg/dL    CBC     Status: Abnormal   Collection Time   11/06/12  4:00 AM      Component Value Range Comment   WBC 11.1 (*) 4.0 - 10.5 K/uL    RBC 3.39 (*) 3.87 - 5.11 MIL/uL    Hemoglobin 9.5 (*) 12.0 - 15.0 g/dL    HCT 08.6 (*) 57.8 - 46.0 %  MCV 78.5  78.0 - 100.0 fL    MCH 28.0  26.0 - 34.0 pg    MCHC 35.7  30.0 - 36.0 g/dL    RDW 81.0  17.5 - 10.2 %    Platelets 150  150 - 400 K/uL   BASIC METABOLIC PANEL     Status: Abnormal   Collection Time   11/06/12  4:00 AM      Component Value Range Comment   Sodium 143  135 - 145 mEq/L    Potassium 4.1  3.5 - 5.1 mEq/L    Chloride 110  96 - 112 mEq/L    CO2 27  19 - 32 mEq/L    Glucose, Bld 129 (*) 70 - 99 mg/dL    BUN 14  6 - 23 mg/dL    Creatinine, Ser 5.85  0.50 - 1.10 mg/dL    Calcium 7.3 (*) 8.4 - 10.5 mg/dL    GFR calc non Af Amer 89 (*) >90 mL/min    GFR calc Af Amer >90  >90 mL/min   MAGNESIUM     Status: Abnormal   Collection Time   11/06/12  4:00 AM      Component Value Range Comment   Magnesium 3.2 (*) 1.5 - 2.5 mg/dL   GLUCOSE, CAPILLARY     Status: Abnormal   Collection Time   11/06/12  4:52 AM      Component Value Range Comment   Glucose-Capillary 105 (*) 70 - 99 mg/dL   GLUCOSE, CAPILLARY     Status: Normal   Collection Time   11/06/12  5:58 AM      Component Value Range Comment   Glucose-Capillary 97  70 - 99 mg/dL   POCT I-STAT 3, BLOOD GAS (G3+)     Status: Abnormal   Collection Time   11/06/12  6:59 AM      Component Value Range Comment   pH, Arterial 7.391  7.350 - 7.450    pCO2 arterial 44.0  35.0 - 45.0 mmHg    pO2, Arterial 77.0 (*) 80.0 - 100.0 mmHg    Bicarbonate 26.7 (*) 20.0 - 24.0 mEq/L    TCO2 28  0 - 100 mmol/L    O2 Saturation 95.0      Acid-Base Excess 2.0  0.0 - 2.0 mmol/L    Patient temperature 37.0 C      Sample type ARTERIAL     GLUCOSE, CAPILLARY     Status: Abnormal   Collection Time   11/06/12  8:32 AM      Component Value Range Comment   Glucose-Capillary 101 (*) 70 - 99 mg/dL      Imaging: Dg Chest 2 View  11/04/2012  *RADIOLOGY REPORT*  Clinical Data: Aortic valve repair  CHEST - 2 VIEW  Comparison: 08/31/2005  Findings: Heart size and vascular pattern are normal.  The patient status post median sternotomy.  Lungs are clear.  No effusions or pneumothoraces.  IMPRESSION: No acute abnormalities   Original Report Authenticated By: Esperanza Heir, M.D.    Dg Chest Portable 1 View In Am  11/06/2012  *RADIOLOGY REPORT*  Clinical Data: Postop ventilator dependence  PORTABLE CHEST - 1 VIEW  Comparison: 11/05/2012  Findings: 0545 hours.  Endotracheal tube tip is 3.8 cm above the base of the carina.  NG tube tip overlies the gastric fundus. Right IJ pulmonary catheter tip overlies the main pulmonary artery. Left-sided chest tube remains in place without evidence for pneumothorax.  Midline drain overlying the  mediastinum noted with a second drain noted over the lower heart, consistent with mediastinal or pericardial drain.  Telemetry leads overlie the chest.  Lung volumes are low.  There is some basilar atelectasis bilaterally.  No overt pulmonary edema or focal airspace consolidation.  IMPRESSION: No substantial interval change in exam.   Original Report Authenticated By: Kennith Center, M.D.    Dg Chest Portable 1 View  11/05/2012  *RADIOLOGY REPORT*  Clinical Data: Postop CABG.  PORTABLE CHEST - 1 VIEW  Comparison: 11/04/2012  Findings: Endotracheal tube is in place, tip approximately 3.6 cm above carina.  Right IJ Swan-Ganz catheter tip overlies the level of the pulmonary artery and appears to be directed posteriorly or anteriorly.  Likely, this is directed posteriorly within the pulmonary artery outflow tract.  A nasogastric tube is in place, tip overlying the level of the stomach.  There are a left-sided chest tube is in place.  No evidence for pneumothorax.  The heart is enlarged and accentuated by portable technique.  No evidence for pulmonary edema.  IMPRESSION:  1.  Postoperative  appearance of the chest. 2.  Cardiomegaly without evidence for pulmonary edema or pneumothorax.   Original Report Authenticated By: Norva Pavlov, M.D.    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  CT ANGIOGRAPHY CHEST  Technique:  Multidetector CT imaging of the chest using the standard protocol during bolus administration of intravenous contrast. Multiplanar reconstructed images including MIPs were obtained and reviewed to evaluate the vascular anatomy.  Contrast: 60mL OMNIPAQUE IOHEXOL 350 MG/ML SOLN  Comparison: 08/31/2005  Findings: Noncontrast images demonstrate considerable atherosclerotic calcification of the thoracic aorta including the ascending thoracic aorta, along with a prosthetic aortic valve.  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  The luminal caliber in the vicinity of the bypass is approximately 4 mm on image 44 of series 602.  Relatively normal caliber aortic outflow tract noted with a chronic- appearing intimal fold in the proximal ascending aorta at the transition to the aneurysmal ascending aorta which measures up to 4.9 cm in diameter.  The anterior arch measures 4.4 cm in diameter.  Posterior arch is at 3.4 cm in diameter, but the posterior descending thoracic aorta of widens backed out 2-0.9 cm, with the ectatic aorta extending down into the abdomen.  There is considerable pathologic thoracic adenopathy.  An AP window node on image 49 of series 5 measures 1.4 cm in short axis, similar to the prior PET CT from 2006.  A right lower paratracheal node measures 1.8 cm in short axis, likewise similar to prior. Considerable bilateral hilar adenopathy noted and there is infrahilar adenopathy.  Subcarinal adenopathy observed.  A left kidney  upper pole cyst is present.  A subpleural nodule in the right lower lobe along the major fissure measures 4 mm in diameter and is stable from 2006.  There is mild elevation of left hemidiaphragm.  No definite nodular airway thickening.  IMPRESSION:  1.  Thoracic aortic aneurysm, with prior repair of the aortic valve and aortic root.  No definite acute intramural hematoma or dissection observed.  There is some intimal infolding proximally in the thoracic aorta which appears to likely be chronic and potentially postoperative along the repair margin, with adjacent high density probably representing calcification along the aortic wall given  the precontrast appearance. 2.  Carotid - left subclavian bypass, with 4 mm in diameter anastomotic lumen. 3.  Considerable pathologic mediastinal and hilar adenopathy appears to be chronic - malignancy is not excluded. 3.  Mildly elevated left hemidiaphragm.   Original Report Authenticated By: Gaylyn Rong, M.D.     Assessment:  1. Principal Problem: 2.  *Unstable angina 3. Active Problems: 4.  CAD (coronary artery disease), severe LM disease at cath 11/03/12 5.  S/P AVR, St Jude 2000 6.  Dilated ascending aorta at cath 11/03/12 7.  Chronic anticoagulation 8.  Contrast media allergy 9.  HTN (hypertension) 10.  Sarcoidosis 11.   Plan:  POD#1 for root graft and CABG x 2 vessels. On amiodarone, dopamine, milrinone and neosynephrine. MAP 83. Plan for diuresis (significantly volume overloaded post-procedure).   Time Spent Directly with Patient:  15 minutes  Length of Stay:  LOS: 2 days   Chrystie Nose, MD, [facility] Attending Cardiologist [facility]  Kami Kube C 11/06/2012, 9:24 AM

## 2012-11-06 NOTE — Anesthesia Postprocedure Evaluation (Signed)
  Anesthesia Post-op Note  Patient: [name]  Procedure(s) Performed: Procedure(s) (LRB) with comments: CORONARY ARTERY BYPASS GRAFTING (CABG) (N/A) - cannulate right subclavian REPLACEMENT ASCENDING AORTA (N/A) REDO STERNOTOMY (N/A) INTRAOPERATIVE TRANSESOPHAGEAL ECHOCARDIOGRAM (N/A) ENDOVEIN HARVEST OF GREATER SAPHENOUS VEIN (Right)  Patient Location: SICU  Anesthesia Type:General  Level of Consciousness: sedated and unresponsive  Airway and Oxygen Therapy: Patient remains intubated per anesthesia plan  Post-op Pain: none  Post-op Assessment: Post-op Vital signs reviewed and Patient's Cardiovascular Status Stable  Post-op Vital Signs: stable  Complications: No apparent anesthesia complications

## 2012-11-06 NOTE — Progress Notes (Signed)
TCTS BRIEF SICU PROGRESS NOTE  1 Day Post-Op  S/P Procedure(s) (LRB): CORONARY ARTERY BYPASS GRAFTING (CABG) (N/A) REPLACEMENT ASCENDING AORTA (N/A) REDO STERNOTOMY (N/A) INTRAOPERATIVE TRANSESOPHAGEAL ECHOCARDIOGRAM (N/A) ENDOVEIN HARVEST OF GREATER SAPHENOUS VEIN (Right)   Stable day NSR w/ BP stable but increased, systolic >160 O2 sats 98% on 3 L/min Excellent UOP  Plan: Will add prn labetalol for HTN Continue current plan  OWEN,CLARENCE H 11/06/2012 5:50 PM

## 2012-11-06 NOTE — Progress Notes (Signed)
1 Day Post-Op Procedure(s) (LRB): CORONARY ARTERY BYPASS GRAFTING (CABG) (N/A) REPLACEMENT ASCENDING AORTA (N/A) REDO STERNOTOMY (N/A) INTRAOPERATIVE TRANSESOPHAGEAL ECHOCARDIOGRAM (N/A) ENDOVEIN HARVEST OF GREATER SAPHENOUS VEIN (Right) Subjective: Redo replacement ascending aneurysm and CABGx2-- circ arrest Extubated, sleepy but responds to command, all extremities Objective: Vital signs in last 24 hours: Temp:  [95.9 F (35.5 C)-99.3 F (37.4 C)] 98.1 F (36.7 C) (02/05 0800) Pulse Rate:  [70-99] 82  (02/05 0800) Cardiac Rhythm:  [-] Normal sinus rhythm (02/05 0800) Resp:  [0-30] 15  (02/05 0800) BP: (82-137)/(45-68) 96/54 mmHg (02/05 0800) SpO2:  [95 %-100 %] 96 % (02/05 0800) Arterial Line BP: (84-172)/(43-87) 110/48 mmHg (02/05 0800) FiO2 (%):  [40 %-50 %] 40 % (02/05 0638)  Hemodynamic parameters for last 24 hours: PAP: (27-43)/(15-25) 30/16 mmHg CO:  [3.1 L/min-3.9 L/min] 3.3 L/min CI:  [1.9 L/min/m2-2.3 L/min/m2] 2 L/min/m2  Intake/Output from previous day: 02/04 0701 - 02/05 0700 In: 9541.8 [I.V.:3304.8; RUEAV:4098; IV Piggyback:2200] Out: 6790 [Urine:2390; Blood:4000; Chest Tube:400] Intake/Output this shift: Total I/O In: 47.3 [I.V.:47.3] Out: -   Sharp closure sound AVR Peripheral pulses 2+ Edema present Lab Results:  Basename 11/06/12 0400 11/05/12 1921 11/05/12 1920  WBC 11.1* -- 16.0*  HGB 9.5* 9.5* --  HCT 26.6* 28.0* --  PLT 150 -- 153   BMET:  Basename 11/06/12 0400 11/05/12 1921 11/05/12 0520  NA 143 144 --  K 4.1 3.3* --  CL 110 -- 106  CO2 27 -- 25  GLUCOSE 129* 187* --  BUN 14 -- 12  CREATININE 0.78 -- 0.77  CALCIUM 7.3* -- 9.9    PT/INR:  Basename 11/05/12 1920  LABPROT 12.6  INR 0.95   ABG    Component Value Date/Time   PHART 7.391 11/06/2012 0659   HCO3 26.7* 11/06/2012 0659   TCO2 28 11/06/2012 0659   ACIDBASEDEF 1.0 11/05/2012 1921   O2SAT 95.0 11/06/2012 0659   CBG (last 3)   Basename 11/06/12 0832 11/06/12 0558 11/06/12  0452  GLUCAP 101* 97 105*    Assessment/Plan: S/P Procedure(s) (LRB): CORONARY ARTERY BYPASS GRAFTING (CABG) (N/A) REPLACEMENT ASCENDING AORTA (N/A) REDO STERNOTOMY (N/A) INTRAOPERATIVE TRANSESOPHAGEAL ECHOCARDIOGRAM (N/A) ENDOVEIN HARVEST OF GREATER SAPHENOUS VEIN (Right) See progression orders Lasix drip, dangle, remove lines Daily coumadin for St Jude AVR start tomorrow  LOS: 2 days    VAN TRIGT III,PETER 11/06/2012

## 2012-11-06 NOTE — Procedures (Signed)
Extubation Procedure Note  Patient Details:   Name: [name] DOB: [birth year] MRN: [id number]   Airway Documentation:  Airway 8 mm (Active)  Secured at (cm) 21 cm 11/06/2012  6:12 AM  Measured From Lips 11/06/2012  6:12 AM  Secured Location Right 11/06/2012  6:12 AM  Secured By Caron Presume Tape 11/06/2012  6:12 AM  Cuff Pressure (cm H2O) 25 cm H2O 11/05/2012  7:12 PM    Evaluation  O2 sats: stable throughout Complications: No apparent complications Patient did tolerate procedure well. Bilateral Breath Sounds: Clear;Diminished Suctioning: Airway Yes She vocalized her name post extubation. Patient had a good air leak prior to extubation. NIf -50 VC 900. IS done post extubation and with minimal efforts. RN will work to help better the IS. Vent removed from room.  [name], [name] 11/06/2012, 7:25 AM

## 2012-11-06 NOTE — Progress Notes (Signed)
Respiratory Consult done. Looked at patients history and home meds with assessment. No need to start any Treatments at this time will continue to monitor patient PRN basis for any lung changes.

## 2012-11-06 NOTE — Op Note (Signed)
[name], [name]              ACCOUNT NO.:  [id number]  MEDICAL RECORD NO.:  [id number]  LOCATION:  2303                         FACILITY:  [facility]  PHYSICIAN:  Burna Forts, M.D.DATE OF BIRTH:  10-15-1950  DATE OF PROCEDURE:  11/06/2012 DATE OF DISCHARGE:                              OPERATIVE REPORT   INDICATION FOR PROCEDURE:  Ms. [name] is brought to the holding area the morning of surgery, where under local anesthesia and sedation, pulmonary Artery and radial arterial lines were placed.  She is scheduled to have a coronary artery bypass grafting as well as ascending aortic replacement to be performed by Dr. Kathlee Nations [name].  We planned to place the TEE probe for evaluation of cardiac structures and function.  After the induction of general anesthesia, the TEE probe was prepared, then passed oropharyngeally into the stomach, then slightly withdrawn for imaging of the cardiac structures.  PRE-CARDIOPULMONARY BYPASS TEE EXAMINATION:  Left ventricle:  In the short axis view, the left ventricular chamber is well visualized.  It is normal wall thickness.  There is excellent contractility in all segments. Right ventricle:  The right ventricular chamber is seen in the four- chamber view.  There is good overall excellent contractile pattern noted.  Of note, there is a papillary muscle within the right ventricular chamber with extensions of chordae to the tricuspid valve. Mitral valve:  The mitral valve is seen initially in the four-chamber view.  There is mild mitral thickening.  Overall, there is satisfactory coaptation and excursion of the mitral valve leaflets.  On color Doppler, there is trace regurgitant flow noted. Aortic valve:  This had been a previously replaced aortic valve in this patient.  There is some loss of or drop out due to the previous aortic valve replacement done.  In the long axis view, we were able to ascertain that there was no obstruction to flow, no  aortic stenosis, and no regurgitant flow was appreciated in the left ventricular outflow tract.  It appeared to be a satisfactory placed aortic valve. On further pullback of the probe, we now could see the somewhat dilated ascending aortic root area.  This extended as far as I could view with the probe in the upper esophagus.  At this maximum, it appeared 3.5-3.7 cm in diameter. Left atrium:  Right atrium with normal structures.  There are no masses appreciated within.  The patient is placed on cardiopulmonary bypass.  Coronary artery bypass grafting is carried out, followed by replacement of the ascending aortic root.  De-airing maneuvers carried out.  The patient has rewarmed and separated from cardiopulmonary bypass with the initial attempt.  POST CARDIOPULMONARY BYPASS TEE EXAMINATION:  Early in the post bypass period, there is significant left ventricular dyssynergy appreciated in the short axis view.  There is a flattened septum.  It is essentially hypocontractile.  There is hypokinesis also appreciated in the posterior wall of the left ventricular chamber and the short axis view.  Anterior walls and inferior walls are vigorous and contractile.  No air is appreciated throughout the left ventricular, left ventricular outflow tract, or the left atrium.  The attention is then turned to the left ventricular outflow tract  area which is clear and on color Doppler reveals again no regurgitant flow and no obstruction to flow that would be consistent with any aortic sclerosis or aortic stenosis.  For the pullback over probe reveals a satisfactory placed aortic root. Right ventricle:  Attention is then turned to the right ventricular chamber in the four-chamber view.  It appears contractile, except for as previously noted.  The somewhat akinetic septum here in early bypass.  Mitral valve and tricuspid valve are again viewed showing trivial regurgitant flow across these valve.  Later in  the post bypass period, there is improved contractile pattern noted in the short-axis view of the left ventricular chamber; more segments are now contractile.  Overall, the contractility appears improved.  There is vigorous contraction of the right ventricular chamber as well.  There are no other changes appreciated and the patient was ultimately returned to the cardiac intensive care unit in stable condition.          ______________________________ Burna Forts, M.D.     JTM/MEDQ  D:  11/06/2012  T:  11/06/2012  Job:  [id number]

## 2012-11-07 ENCOUNTER — Inpatient Hospital Stay (HOSPITAL_COMMUNITY): Payer: PRIVATE HEALTH INSURANCE

## 2012-11-07 LAB — BASIC METABOLIC PANEL
BUN: 13 mg/dL (ref 6–23)
BUN: 14 mg/dL (ref 6–23)
CO2: 30 mEq/L (ref 19–32)
CO2: 34 mEq/L — ABNORMAL HIGH (ref 19–32)
Calcium: 9 mg/dL (ref 8.4–10.5)
Calcium: 9.5 mg/dL (ref 8.4–10.5)
Chloride: 107 mEq/L (ref 96–112)
Chloride: 103 mEq/L (ref 96–112)
Creatinine, Ser: 0.78 mg/dL (ref 0.50–1.10)
Creatinine, Ser: 0.76 mg/dL (ref 0.50–1.10)
GFR calc Af Amer: >90 mL/min (ref >90)
GFR calc Af Amer: >90 mL/min (ref >90)
GFR calc non Af Amer: 89 mL/min — ABNORMAL LOW (ref >90)
GFR calc non Af Amer: 90 mL/min — ABNORMAL LOW (ref >90)
Glucose, Bld: 133 mg/dL — ABNORMAL HIGH (ref 70–99)
Glucose, Bld: 99 mg/dL (ref 70–99)
Potassium: 4.1 mEq/L (ref 3.5–5.1)
Potassium: 3.7 mEq/L (ref 3.5–5.1)
Sodium: 144 mEq/L (ref 135–145)
Sodium: 142 mEq/L (ref 135–145)

## 2012-11-07 LAB — CARBOXYHEMOGLOBIN
Carboxyhemoglobin: 1.4 % (ref 0.5–1.5)
Methemoglobin: 1.7 % — ABNORMAL HIGH (ref 0.0–1.5)
O2 Saturation: 69.2 %
Total hemoglobin: 10.5 g/dL — ABNORMAL LOW (ref 12.0–16.0)

## 2012-11-07 LAB — GLUCOSE, CAPILLARY
Glucose-Capillary: 128 mg/dL — ABNORMAL HIGH (ref 70–99)
Glucose-Capillary: 123 mg/dL — ABNORMAL HIGH (ref 70–99)
Glucose-Capillary: 89 mg/dL (ref 70–99)
Glucose-Capillary: 81 mg/dL (ref 70–99)

## 2012-11-07 LAB — CBC
HCT: 31.5 % — ABNORMAL LOW (ref 36.0–46.0)
Hemoglobin: 10.9 g/dL — ABNORMAL LOW (ref 12.0–15.0)
MCH: 27.5 pg (ref 26.0–34.0)
MCHC: 34.6 g/dL (ref 30.0–36.0)
MCV: 79.5 fL (ref 78.0–100.0)
Platelets: 107 10*3/uL — ABNORMAL LOW (ref 150–400)
RBC: 3.96 MIL/uL (ref 3.87–5.11)
RDW: 15.7 % — ABNORMAL HIGH (ref 11.5–15.5)
WBC: 17.6 10*3/uL — ABNORMAL HIGH (ref 4.0–10.5)

## 2012-11-07 LAB — PROTIME-INR: Prothrombin Time: 15.3 seconds — ABNORMAL HIGH (ref 11.6–15.2)

## 2012-11-07 MED ORDER — DOPAMINE-DEXTROSE 3.2-5 MG/ML-% IV SOLN: 2.5000 ug/kg/min | INTRAVENOUS | Status: DC

## 2012-11-07 MED ORDER — ENOXAPARIN SODIUM 40 MG/0.4ML ~~LOC~~ SOLN
40.0000 mg | SUBCUTANEOUS | Status: DC
  Administered 2012-11-07 – 2012-11-10 (×4): 40 mg via SUBCUTANEOUS
  Filled 2012-11-07 (×4): qty 0.4

## 2012-11-07 MED ORDER — TRAMADOL HCL 50 MG PO TABS
50.0000 mg | ORAL_TABLET | Freq: Four times a day (QID) | ORAL | Status: DC | PRN
  Administered 2012-11-07 – 2012-11-22 (×16): 50 mg via ORAL
  Filled 2012-11-07 (×16): qty 1

## 2012-11-07 MED ORDER — METOPROLOL TARTRATE 25 MG/10 ML ORAL SUSPENSION
12.5000 mg | Freq: Two times a day (BID) | ORAL | Status: DC
  Filled 2012-11-07 (×3): qty 5

## 2012-11-07 MED ORDER — AMIODARONE HCL 200 MG PO TABS
200.0000 mg | ORAL_TABLET | Freq: Two times a day (BID) | ORAL | Status: DC
  Administered 2012-11-07 – 2012-11-26 (×39): 200 mg via ORAL
  Filled 2012-11-07 (×41): qty 1

## 2012-11-07 MED ORDER — METOPROLOL TARTRATE 25 MG PO TABS
25.0000 mg | ORAL_TABLET | Freq: Two times a day (BID) | ORAL | Status: DC
  Administered 2012-11-07: 25 mg via ORAL
  Filled 2012-11-07 (×3): qty 1

## 2012-11-07 MED ORDER — POTASSIUM CHLORIDE 10 MEQ/50ML IV SOLN
10.0000 meq | INTRAVENOUS | Status: AC
  Administered 2012-11-07 (×4): 10 meq via INTRAVENOUS
  Filled 2012-11-07: qty 200

## 2012-11-07 MED ORDER — POTASSIUM CHLORIDE 10 MEQ/50ML IV SOLN
10.0000 meq | INTRAVENOUS | Status: AC
  Administered 2012-11-07 (×2): 10 meq via INTRAVENOUS

## 2012-11-07 MED ORDER — WARFARIN - PHYSICIAN DOSING INPATIENT
Freq: Every day | Status: DC
  Administered 2012-11-07 – 2012-11-15 (×5)

## 2012-11-07 MED ORDER — POTASSIUM CHLORIDE 10 MEQ/50ML IV SOLN
INTRAVENOUS | Status: AC
  Administered 2012-11-07: 10 meq via INTRAVENOUS
  Filled 2012-11-07: qty 100

## 2012-11-07 MED ORDER — WARFARIN SODIUM 2.5 MG PO TABS
2.5000 mg | ORAL_TABLET | Freq: Every day | ORAL | Status: DC
  Administered 2012-11-07: 2.5 mg via ORAL
  Filled 2012-11-07 (×2): qty 1

## 2012-11-07 MED ORDER — VANCOMYCIN HCL IN DEXTROSE 1-5 GM/200ML-% IV SOLN
1000.0000 mg | INTRAVENOUS | Status: AC
  Administered 2012-11-07: 1000 mg via INTRAVENOUS
  Filled 2012-11-07: qty 200

## 2012-11-07 NOTE — Progress Notes (Signed)
[facility]  Subjective: Drowsy but arousable. No complaints. Out of bed, sitting in chair.   Objective: Vital signs in last 24 hours: Temp:  [96.3 F (35.7 C)-98.4 F (36.9 C)] 98 F (36.7 C) (02/06 0802) Pulse Rate:  [74-100] 100  (02/06 0900) Resp:  [0-31] 23  (02/06 0900) BP: (86-143)/(34-75) 87/57 mmHg (02/06 0900) SpO2:  [94 %-98 %] 94 % (02/06 0900) Arterial Line BP: (112-183)/(43-74) 163/44 mmHg (02/06 0900) Weight:  [192 lb 14.4 oz (87.5 kg)] 192 lb 14.4 oz (87.5 kg) (02/06 0600) Last BM Date: 11/04/12  Intake/Output from previous day: 02/05 0701 - 02/06 0700 In: 1755.7 [P.O.:90; I.V.:905.7; IV Piggyback:760] Out: 6035 [Urine:5225; Chest Tube:810] Intake/Output this shift: Total I/O In: 291.6 [I.V.:91.6; IV Piggyback:200] Out: 570 [Urine:550; Chest Tube:20]  Medications Current Facility-Administered Medications  Medication Dose Route Frequency Provider Last Rate Last Dose  . 0.9 %  sodium chloride infusion   Intravenous Continuous Kerin Perna, MD 20 mL/hr at 11/07/12 0800    . 0.9 %  sodium chloride infusion  250 mL Intravenous PRN Rowe Clack, PA      . acetaminophen (TYLENOL) tablet 1,000 mg  1,000 mg Oral Q6H Wayne E Gold, PA   1,000 mg at 11/07/12 4540   Or  . acetaminophen (TYLENOL) solution 975 mg  975 mg Per Tube Q6H Rowe Clack, PA   975 mg at 11/06/12 0604  . aspirin EC tablet 325 mg  325 mg Oral Daily Rowe Clack, PA       Or  . aspirin chewable tablet 324 mg  324 mg Per Tube Daily Rowe Clack, PA      . atorvastatin (LIPITOR) tablet 40 mg  40 mg Oral q1800 Rowe Clack, PA   40 mg at 11/06/12 1728  . bisacodyl (DULCOLAX) EC tablet 10 mg  10 mg Oral Daily Rowe Clack, PA       Or  . bisacodyl (DULCOLAX) suppository 10 mg  10 mg Rectal Daily Wayne E Gold, PA      . cefUROXime (ZINACEF) 1.5 g in dextrose 5 % 50 mL IVPB  1.5 g Intravenous Q12H Rowe Clack, PA   1.5 g at 11/06/12 2140  . docusate sodium (COLACE)  capsule 200 mg  200 mg Oral Daily Rowe Clack, [place]      . DOPamine (INTROPIN) 800 mg in dextrose 5 % 250 mL infusion  2.5 mcg/kg/min Intravenous Titrated Kerin Perna, MD 4.9 mL/hr at 11/07/12 0815 3 mcg/kg/min at 11/07/12 0815  . furosemide (LASIX) 250 mg in dextrose 5 % 250 mL infusion  6 mg/hr Intravenous Continuous Kerin Perna, MD 6 mL/hr at 11/07/12 0800 6 mg/hr at 11/07/12 0800  . insulin aspart (novoLOG) injection 0-24 Units  0-24 Units Subcutaneous Q4H Kerin Perna, MD   2 Units at 11/07/12 [phone]  . insulin detemir (LEVEMIR) injection 10 Units  10 Units Subcutaneous BID Kerin Perna, MD   10 Units at 11/06/12 2145  . labetalol (NORMODYNE,TRANDATE) injection 10 mg  10 mg Intravenous Q2H PRN Purcell Nails, MD      . metoprolol (LOPRESSOR) injection 2.5-5 mg  2.5-5 mg Intravenous Q2H PRN Rowe Clack, PA      . metoprolol tartrate (LOPRESSOR) tablet 12.5 mg  12.5 mg Oral BID Rowe Clack, PA       Or  . metoprolol tartrate (LOPRESSOR) 25 mg/10 mL oral suspension 12.5 mg  12.5  mg Per Tube BID Rowe Clack, PA   12.5 mg at 11/06/12 2144  . naphazoline-pheniramine (NAPHCON-A) 0.025-0.3 % ophthalmic solution 1 drop  1 drop Both Eyes QID PRN Rowe Clack, PA      . ondansetron [facility]) injection 4 mg  4 mg Intravenous Q6H PRN Rowe Clack, PA   4 mg at 11/06/12 2956  . pantoprazole (PROTONIX) EC tablet 40 mg  40 mg Oral Daily Wayne E Gold, [place]      . sodium chloride 0.9 % injection 3 mL  3 mL Intravenous Q12H Wayne E Gold, PA   3 mL at 11/06/12 1056  . sodium chloride 0.9 % injection 3 mL  3 mL Intravenous PRN Rowe Clack, PA      . traMADol [name]) tablet 50 mg  50 mg Oral Q6H PRN Kerin Perna, MD      . vancomycin (VANCOCIN) IVPB 1000 mg/200 mL premix  1,000 mg Intravenous Q12H Kerin Perna, MD   1,000 mg at 11/07/12 0905  . warfarin (COUMADIN) tablet 2.5 mg  2.5 mg Oral q1800 Kerin Perna, MD      . Warfarin - Physician Dosing Inpatient   Does not apply q1800 Kerin Perna, MD        PE: General appearance: alert, cooperative and no distress Lungs: clear to auscultation bilaterally Heart: regular rate and rhythm and distant heart sounds Extremities: no LEE Pulses: 2+ radials. Weak distal pulses Skin: cool distal extremities Neurologic: Grossly normal  Lab Results:   Basename 11/07/12 0350 11/06/12 1619 11/06/12 1600 11/06/12 0400  WBC 17.6* -- 16.2* 11.1*  HGB 10.9* 11.2* 10.9* --  HCT 31.5* 33.0* 31.5* --  PLT 107* -- 124* 150   BMET  Basename 11/07/12 0350 11/06/12 1619 11/06/12 1600 11/06/12 0400 11/05/12 0520  NA 144 142 -- 143 --  K 4.1 3.9 -- 4.1 --  CL 107 107 -- 110 --  CO2 30 -- -- 27 25  GLUCOSE 133* 137* -- 129* --  BUN 13 12 -- 14 --  CREATININE 0.78 0.80 0.74 -- --  CALCIUM 9.0 -- -- 7.3* 9.9   PT/INR  Basename 11/07/12 0350 11/05/12 1920  LABPROT 15.3* 12.6  INR 1.23 0.95   Studies/Results: CXR 2/6- pending   Assessment/Plan  Principal Problem:  *Unstable angina Active Problems:  CAD-severe LM disease- S/P CABG X 2 (SVG-OM, SVG-LAD) 11/05/12  S/P AVR, St Jude 2000  Dilated ascending aorta at cath 11/03/12 - S/P root replacement 11/05/12  Chronic anticoagulation  Contrast media allergy  HTN (hypertension)  Sarcoidosis  Plan:  S/P CABG x 2 (SVG-OM, SVG-LAD), and S/P ascending aorta replacement. POD# 2. She was extubated yesterday. She is on 2L supplemental O2 via , with O2 sats of 95%. She has had some hypotension. Most recent BP 87/57. Distal extremities are cool on exam. She is on Dopamine (72mcg/kg/min). HR is stable and she is maintaining NSR on telemetry. No red alarms overnight. Will continue to monitor. MD to follow with further recommendation.    LOS: 3 days    Brittainy M. Delmer Islam 11/07/2012 9:22 AM   Patient seen and examined. Agree with assessment and plan. Day 2 s/p CABG and ascending aorta root replacement. BP now 102 systolic; in NSR in 80's. Neurologically intact. Mild edema.   Lennette Bihari, MD, [facility] 11/07/2012 9:49 AM

## 2012-11-07 NOTE — Progress Notes (Signed)
2 Days Post-Op Procedure(s) (LRB): CORONARY ARTERY BYPASS GRAFTING (CABG) (N/A) REPLACEMENT ASCENDING AORTA (N/A) REDO STERNOTOMY (N/A) INTRAOPERATIVE TRANSESOPHAGEAL ECHOCARDIOGRAM (N/A) ENDOVEIN HARVEST OF GREATER SAPHENOUS VEIN (Right) Subjective: Redo CABG, ascending TAA w/ circ arrest OOB to chair Diuresing  Vital signs in last 24 hours: Temp:  [96.3 F (35.7 C)-98.4 F (36.9 C)] 98.4 F (36.9 C) (02/06 0400) Pulse Rate:  [74-90] 86  (02/06 0700) Cardiac Rhythm:  [-] Normal sinus rhythm (02/06 0745) Resp:  [0-31] 19  (02/06 0700) BP: (86-143)/(34-75) 89/41 mmHg (02/06 0600) SpO2:  [94 %-98 %] 96 % (02/06 0700) Arterial Line BP: (112-183)/(46-74) 178/47 mmHg (02/06 0700) Weight:  [192 lb 14.4 oz (87.5 kg)] 192 lb 14.4 oz (87.5 kg) (02/06 0600)  Hemodynamic parameters for last 24 hours: PAP: (32-46)/(16-28) 41/19 mmHg CO:  [4.4 L/min-5.5 L/min] 5.5 L/min CI:  [2.6 L/min/m2-3.2 L/min/m2] 3.2 L/min/m2  Intake/Output from previous day: 02/05 0701 - 02/06 0700 In: 1755.7 [P.O.:90; I.V.:905.7; IV Piggyback:760] Out: 6035 [Urine:5225; Chest Tube:810] Intake/Output this shift:    Decreased breath sounds extrem warm  Lab Results:  Basename 11/07/12 0350 11/06/12 1619 11/06/12 1600  WBC 17.6* -- 16.2*  HGB 10.9* 11.2* --  HCT 31.5* 33.0* --  PLT 107* -- 124*   BMET:  Basename 11/07/12 0350 11/06/12 1619 11/06/12 0400  NA 144 142 --  K 4.1 3.9 --  CL 107 107 --  CO2 30 -- 27  GLUCOSE 133* 137* --  BUN 13 12 --  CREATININE 0.78 0.80 --  CALCIUM 9.0 -- 7.3*    PT/INR:  Basename 11/07/12 0350  LABPROT 15.3*  INR 1.23   ABG    Component Value Date/Time   PHART 7.385 11/06/2012 1111   HCO3 26.0* 11/06/2012 1111   TCO2 26 11/06/2012 1619   ACIDBASEDEF 1.0 11/05/2012 1921   O2SAT 94.0 11/06/2012 1111   CBG (last 3)   Basename 11/06/12 2001 11/06/12 1227 11/06/12 1057  GLUCAP 121* 117* 96    Assessment/Plan: S/P Procedure(s) (LRB): CORONARY ARTERY BYPASS  GRAFTING (CABG) (N/A) REPLACEMENT ASCENDING AORTA (N/A) REDO STERNOTOMY (N/A) INTRAOPERATIVE TRANSESOPHAGEAL ECHOCARDIOGRAM (N/A) ENDOVEIN HARVEST OF GREATER SAPHENOUS VEIN (Right) Cont current care Resume coumadin for St Jude AVR   LOS: 3 days    [name],[name] 11/07/2012

## 2012-11-07 NOTE — Progress Notes (Signed)
T. CTS  Diuresing well Remains somewhat lethargic but responds appropriately Walk approximately 100 feet P.m. labs adequate potassium supplemented Advance diet after swallowing evaluation, diet per SLT

## 2012-11-07 NOTE — Op Note (Signed)
NAMEKARENA, KINKER NO.:  [id number]  MEDICAL RECORD NO.:  [id number]  LOCATION:  2303                         FACILITY:  [facility]  PHYSICIAN:  Kerin Perna, M.D.  DATE OF BIRTH:  August 28, 1951  DATE OF PROCEDURE:  11/05/2012 DATE OF DISCHARGE:                              OPERATIVE REPORT   OPERATIONS: 1. Redo sternotomy. 2. 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 3. Coronary artery bypass graft x2 (saphenous vein graft to left     anterior descending coronary artery, saphenous vein graft to     circumflex marginal). 4. Endoscopic harvest of right leg greater saphenous vein. 5. Right axillary artery cannulation for cardiopulmonary bypass and     antegrade cerebral perfusion. 6. Hypothermic circulatory arrest for aortic replacement. 7. Percutaneous venous cannulation through the right femoral vein. 8. Placement of left femoral artery A-line for blood pressure     monitoring.  PREOPERATIVE DIAGNOSES: 1. 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 2. Progressive dilatation of the ascending aorta above the aortic     root, measuring 4.9 cm to the aortic arch without hematoma or     dissection.  POSTOPERATIVE DIAGNOSES: 1. 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 2. Progressive dilatation of the ascending aorta above the aortic     root, measuring 4.9 cm to the aortic arch without hematoma or     dissection.  SURGEON:  Kerin Perna, MD  ASSISTANTS:  Rowe Clack, PAC and Doree Fudge, [place]  ANESTHESIA:  General by Burna Forts, MD  CLINICAL NOTE:  The patient is a 62 year old and was admitted to the hospital with symptoms of unstable angina.  She is status post a mechanical valve-conduit Bentall  root replacement in the year 2000, and did well until recently when she developed exertional chest pain.  Her stress test and 2D echo were normal and the aortic valve was functioning well.  A cardiac catheterization, however, demonstrated a new left main stenosis.  The right coronary button was not well visualized.  The cardiac cath and a subsequent CTA of the thoracic aorta showed a fusiform ascending aneurysm above the previously placed Dacron graft to the aortic root.  The patient also had a previous left subclavian artery stenosis and a left carotid to subclavian bypass with a Dacron graft in 2001.  This was patent.  It was felt that the patient would benefit from surgical revascularization for her left main stenosis and replacement of the remainder of the ascending aorta to the aortic arch which had formed a fusiform aneurysm.  Prior to surgery, I discussed the results of cardiac cath and echo and CTA with the patient and discussed redo sternotomy for bypass surgery, as well as replacement of the ascending aorta.  She understood the indications, benefits, alternatives, and risks.  We discussed those risks including risks of bleeding, blood transfusion requirement, stroke, MI, infection, and  death.  She understood that we would require hypothermic circulatory arrest and that the operation would take a significant period of time due to the cooling and rewarming on bypass. She demonstrated her understanding and agreed to proceed with the surgery under what I felt was an informed consent.  OPERATIVE FINDINGS: 1. 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 2. Normal functioning of the aortic valve prosthesis by direct     inspection as well as by transesophageal  echo. 3. Antegrade cerebral perfusion during the period of hypothermic     circulatory arrest with maintenance of normal cerebral oximeter     readings for both hemispheres. 4. Post pump TEE showing good LV function, normal functioning aortic     valve.  OPERATIVE PROCEDURE:  The patient was brought to the operating room and placed supine on the operating table where general anesthesia was induced under invasive hemodynamic monitoring.  A transesophageal 2D echo probe was placed by the anesthesiologist.  The patient was prepped and draped as a sterile field.  First, a left femoral A-line was placed for blood pressure monitoring.  Next, a right femoral vein micro introducer 5-French was placed for possible percutaneous cannulation of the vein.  Next, attention was directed to the right axillary artery. An incision was made in the deltopectoral groove and dissection was carried down through the pectoralis major and pectoralis minor.  The axillary artery was identified and encircled with vessel loops.  Heparin 3000 units was administered and the axillary artery was clamped with vascular clamps.  An arteriotomy was performed and an 8-mm Gore-Tex graft connected to a cannulation tubing was sewn end-to-side with running 5-0 Prolene.  The clamps were removed and there was good hemostasis at the anastomosis.  The axillary artery graft and cannulation line were then connected to the cardiopulmonary bypass inflow circuit.  Next, redo sternotomy was performed using the oscillating saw with care being taken to avoid injury to the underlying vascular structures. After the sternotomy was completed and when we lifted the left side of the sternal table to dissect the anterior mediastinum, there was some bleeding from the right ventricle.  This was easily controlled with direct pressure.  This necessitated the percutaneous cannulation through the right femoral vein over the guidewire previously placed  using the dilator system and a 23-25 mm Estech venous cannula.  This was passed after the patient was heparinized for cardiopulmonary bypass and the transesophageal echo showed 2-stage cannula to be in the correct position in the proximal SVC.  The patient was then connected to the cardiopulmonary bypass circuit and cardiopulmonary bypass was started at full flow.  Attention was then directed back towards dissecting out the anterior mediastinum.  The right ventricle, right atrium, and ascending aorta were dissected out.  The aorta above the previously placed graft was dilated and there was some thinned out areas and was clear that the aorta needed to be replaced.  The vessel loop was placed around the innominate artery junction with the aortic arch.  A cardioplegia cannula was placed in the right atrium directed into the coronary sinus.  An LV vent was placed via the right superior pulmonary vein and into the LV through the mitral valve.  The ascending aorta was adherent to the pulmonary artery,  and fair amount of reaction from the previous operation was difficult to place a clamp safely on the aorta beneath the innominate artery, and I decided to proceed with full hypothermic circulatory arrest to replace the aorta first and then do the bypass grafts.  We then cooled the patient down to 18 degrees and anesthesia placed the head packed in ice and gave etomidate IV anesthetic and 1/2 a g of IV Solu-Medrol.  When the patient reached 18 degrees, the pump was drained and circulatory arrest started.  During the entire period of circulatory arrest, except for the first 2 minutes, antegrade cerebral perfusion was performed through the right axillary artery at 500 mL/minute, and the origin of the right innominate artery to the aortic arch was clamped with a vascular clamp to well perfusion of the brain and there was good maintenance of the cerebral oximeter readings throughout this period of  time.  After circulatory arrest was performed, the remainder of the heart dissection was completed, so that we could expose the coronaries for later bypass grafting.  The right leg greater saphenous vein had been harvested, it was an adequate conduit.  Next, the ascending aorta was dissected out and transected beneath the innominate artery and taken down toward the previously placed root graft, was connected.  The size of the root was measured, and I chose a 30-mm straight Hemashield graft.  The Hemashield graft was sewn end-to- end to the previously distal aspect of the conduit graft using running 3- 0 Prolene.  The graft was then cut to the appropriate length and tapered to the appropriate angle for anastomosis to the proximal arch.  This was also performed with a running 3-0 Prolene with the posterior aspect of the anastomosis reinforced inside the anastomosis with several interrupted 4-0 pledgeted Prolene sutures.  The anastomosis was continued around the anterior surface and was reinforced with a pledgeted 4-0 Prolene sutures on the outside of the anastomosis as well and then flow was reestablished to the body and circulatory arrest was stopped after 67 minutes.  Full flow was resumed and the patient was then slowly rewarmed while we did the next phase of the surgery, placing the bypass grafts.  A clamp was placed on the recently constructed graft below the innominate artery and cardioplegia was delivered to the heart using the retrograde catheter into the coronary sinus.  Temperature to the heart was maintained less than 15 degrees.  The distal coronary anastomoses were performed.  The first anastomosis was to the distal LAD.  The reverse saphenous vein was sewn end-to-side with running 7-0 Prolene with excellent flow through graft. Cardioplegia was delivered through this graft.  The second distal anastomosis was to the large OM branch of the circumflex.  A reverse saphenous  vein was sewn end-to-side with running 7-0 Prolene with good flow through this graft and cardioplegia was then re-dosed through the 2 vein grafts.  While the crossclamp was still in place and the patient was being rewarmed, a small opening and the newly placed graft using the hand-held cautery was performed to perform the proximal anastomosis of the LAD vein using running 6-0 Prolene.  The vein to the circumflex was sewn end- to-side to the proximal portion of the LAD vein graft, so that there is only one opening in the Dacron graft, recently placed to reconstruct the ascending thoracic aneurysm.  After placement of the vein graft to the LAD, the clamp was removed after dose of retrograde warm blood cardioplegia was delivered.  After the crossclamp was removed, the vein graft to the circumflex was sewn end-to-side to the proximal portion of the LAD vein graft using running 7-0 Prolene and all the vascular clamps were removed off the vein grafts and each was perfused.  There was good perfusion of vein graft and hemostasis was documented at the proximal distal anastomoses. The patient was then rewarmed back to 37 degrees.  Temporary pacer wires were applied.  The proximal distal aortic anastomoses were checked and found to be hemostatic.  There was diffuse bleeding from the dissected redo mediastinum.  The platelet count was low at 50,000.  When the patient was rewarmed and reperfused, the lungs re-expanded and low-dose dopamine and milrinone were started.  The patient was then weaned off cardiopulmonary bypass without difficulty.  The echo showed preserved biventricular function.  Protamine was administered without adverse reaction.  The venous cannula from the right femoral vein was removed. We left the axillary artery cannula and until all the  volume requirements were met, and the patient was stable.  The axillary artery graft was clamped and divided just above the anastomosis and  tied with heavy silk sutures as well as a running 4-0 Prolene.  The mediastinum was then irrigated and hemostasis was difficult to achieve due to the patient's coagulopathy and thrombocytopenia. Platelets and FFP were administered.  There was still considerable diffuse bleeding.  There was no discrete bleeding from the aortic anastomoses.  Chest tubes were placed in the left pleural space in the anterior and posterior mediastinum.  A dose of 1/2 dose calculated, factor VII was given (4 mg) and this improved coagulation function significantly.  The thymic fat was closed over the aorta.  The sternum was closed with wire.  The patient remained stable after the sternal closure.  The pectoralis fascia was closed with interrupted #1 Vicryl. The subcutaneous and skin layers were closed in running Vicryl.  The right axillary artery incision was irrigated and hemostasis was obtained and was closed in layers using interrupted Vicryl for the muscle and a running 2-0 Vicryl for the subcutaneous and a 3-0 Vicryl for the skin.  The patient was then returned to the SICU in critical but stable condition.  Total cardiopulmonary bypass time was 330 minutes. Total circulatory arrest with antegrade cerebral perfusion, maintaining the brain perfusion was 67 minutes.     Kerin Perna, M.D.     PV/MEDQ  D:  11/07/2012  T:  11/07/2012  Job:  [id number]

## 2012-11-08 ENCOUNTER — Inpatient Hospital Stay (HOSPITAL_COMMUNITY): Payer: PRIVATE HEALTH INSURANCE

## 2012-11-08 LAB — TYPE AND SCREEN
ABO/RH(D): O POS
Antibody Screen: NEGATIVE
Unit division: 0
Unit division: 0
Unit division: 0
Unit division: 0
Unit division: 0
Unit division: 0
Unit division: 0
Unit division: 0
Unit division: 0
Unit division: 0
Unit division: 0
Unit division: 0

## 2012-11-08 LAB — COMPREHENSIVE METABOLIC PANEL
ALT: 28 U/L (ref 0–35)
AST: 46 U/L — ABNORMAL HIGH (ref 0–37)
Albumin: 3 g/dL — ABNORMAL LOW (ref 3.5–5.2)
Alkaline Phosphatase: 48 U/L (ref 39–117)
BUN: 15 mg/dL (ref 6–23)
CO2: 36 mEq/L — ABNORMAL HIGH (ref 19–32)
Calcium: 9.3 mg/dL (ref 8.4–10.5)
Chloride: 100 mEq/L (ref 96–112)
Creatinine, Ser: 0.76 mg/dL (ref 0.50–1.10)
GFR calc Af Amer: >90 mL/min (ref >90)
GFR calc non Af Amer: 90 mL/min — ABNORMAL LOW (ref >90)
Glucose, Bld: 92 mg/dL (ref 70–99)
Potassium: 3.9 mEq/L (ref 3.5–5.1)
Sodium: 140 mEq/L (ref 135–145)
Total Bilirubin: 1.2 mg/dL (ref 0.3–1.2)
Total Protein: 5.6 g/dL — ABNORMAL LOW (ref 6.0–8.3)

## 2012-11-08 LAB — GLUCOSE, CAPILLARY
Glucose-Capillary: 84 mg/dL (ref 70–99)
Glucose-Capillary: 65 mg/dL — ABNORMAL LOW (ref 70–99)
Glucose-Capillary: 90 mg/dL (ref 70–99)
Glucose-Capillary: 69 mg/dL — ABNORMAL LOW (ref 70–99)
Glucose-Capillary: 104 mg/dL — ABNORMAL HIGH (ref 70–99)

## 2012-11-08 LAB — PROTIME-INR
INR: 1.24 (ref 0.00–1.49)
Prothrombin Time: 15.4 seconds — ABNORMAL HIGH (ref 11.6–15.2)

## 2012-11-08 LAB — CBC
HCT: 28.3 % — ABNORMAL LOW (ref 36.0–46.0)
Hemoglobin: 9.5 g/dL — ABNORMAL LOW (ref 12.0–15.0)
MCH: 27.2 pg (ref 26.0–34.0)
MCHC: 33.6 g/dL (ref 30.0–36.0)
MCV: 81.1 fL (ref 78.0–100.0)
Platelets: 95 10*3/uL — ABNORMAL LOW (ref 150–400)
RBC: 3.49 MIL/uL — ABNORMAL LOW (ref 3.87–5.11)
RDW: 15.9 % — ABNORMAL HIGH (ref 11.5–15.5)
WBC: 17.1 10*3/uL — ABNORMAL HIGH (ref 4.0–10.5)

## 2012-11-08 MED ORDER — DEXTROSE 50 % IV SOLN
25.0000 mL | Freq: Once | INTRAVENOUS | Status: AC | PRN
  Administered 2012-11-08: 25 mL via INTRAVENOUS

## 2012-11-08 MED ORDER — METOPROLOL TARTRATE 12.5 MG HALF TABLET
12.5000 mg | ORAL_TABLET | Freq: Two times a day (BID) | ORAL | Status: DC
  Administered 2012-11-08: 12.5 mg via ORAL
  Administered 2012-11-09 (×2): via ORAL
  Administered 2012-11-10 – 2012-11-26 (×31): 12.5 mg via ORAL
  Filled 2012-11-08 (×37): qty 1

## 2012-11-08 MED ORDER — POTASSIUM CHLORIDE 10 MEQ/50ML IV SOLN
10.0000 meq | INTRAVENOUS | Status: AC
  Administered 2012-11-08 (×3): 10 meq via INTRAVENOUS
  Filled 2012-11-08: qty 100

## 2012-11-08 MED ORDER — POTASSIUM CHLORIDE 10 MEQ/50ML IV SOLN
INTRAVENOUS | Status: AC
  Filled 2012-11-08: qty 50

## 2012-11-08 MED ORDER — FUROSEMIDE 10 MG/ML IJ SOLN
40.0000 mg | Freq: Two times a day (BID) | INTRAMUSCULAR | Status: DC
  Administered 2012-11-08 – 2012-11-11 (×7): 40 mg via INTRAVENOUS
  Filled 2012-11-08 (×8): qty 4

## 2012-11-08 MED ORDER — SODIUM CHLORIDE 0.9 % IJ SOLN
10.0000 mL | Freq: Two times a day (BID) | INTRAMUSCULAR | Status: DC
  Administered 2012-11-09 (×2): 10 mL
  Administered 2012-11-10: 20 mL
  Administered 2012-11-10 – 2012-11-18 (×8): 10 mL
  Administered 2012-11-18: 20 mL
  Administered 2012-11-19 – 2012-11-20 (×2): 10 mL
  Administered 2012-11-21: 40 mL
  Administered 2012-11-21: 10 mL
  Administered 2012-11-22 – 2012-11-23 (×3): 30 mL
  Administered 2012-11-23: 10 mL
  Administered 2012-11-24: 12 mL
  Administered 2012-11-25: 10 mL

## 2012-11-08 MED ORDER — WARFARIN SODIUM 5 MG PO TABS
5.0000 mg | ORAL_TABLET | Freq: Every day | ORAL | Status: DC
  Administered 2012-11-08 – 2012-11-10 (×3): 5 mg via ORAL
  Filled 2012-11-08 (×4): qty 1

## 2012-11-08 MED ORDER — DEXTROSE 50 % IV SOLN
INTRAVENOUS | Status: AC
  Filled 2012-11-08: qty 50

## 2012-11-08 MED ORDER — SODIUM CHLORIDE 0.9 % IJ SOLN: 10.0000 mL | INTRAMUSCULAR | Status: DC | PRN

## 2012-11-08 NOTE — Progress Notes (Signed)
Mental status better today  BP 121/49  Pulse 87  Temp 98.3 F (36.8 C) (Oral)  Resp 19  Ht 5\' 7"  (1.702 m)  Wt 183 lb 3.2 oz (83.1 kg)  BMI 28.69 kg/m2  SpO2 100%   Intake/Output Summary (Last 24 hours) at 11/08/12 1723 Last data filed at 11/08/12 1400  Gross per 24 hour  Intake 1358.8 ml  Output   4265 ml  Net -2906.2 ml    Levimir dc'ed due to hypoglycemia  Awaiting PICC

## 2012-11-08 NOTE — Evaluation (Signed)
Physical Therapy Evaluation Patient Details Name: [name] MRN: [id number] DOB: [birth year] Today's Date: 11/08/2012 Time: 4098-1191 PT Time Calculation (min): 25 min  PT Assessment / Plan / Recommendation Clinical Impression  Pt s/p CABG redo with decr mobility secondary to decr endurance and slight decr balance.  Will benefit from PT to address balance and endurance.  Should not need any equipment or f/u if progresses as PT anticipates.  Should just go to Cardiac Rehab phase 2.     PT Assessment  Patient needs continued PT services    Follow Up Recommendations  No PT follow up        Barriers to Discharge Decreased caregiver support      Equipment Recommendations  None recommended by PT         Frequency Min 3X/week    Precautions / Restrictions Precautions Precautions: Fall;Sternal Restrictions Weight Bearing Restrictions: No   Pertinent Vitals/Pain See doc flowsheet, Some pain      Mobility  Bed Mobility Bed Mobility: Not assessed Transfers Transfers: Sit to Stand;Stand to Sit Sit to Stand: 4: Min guard;Without upper extremity assist;From chair/3-in-1 Stand to Sit: 4: Min guard;Without upper extremity assist;To chair/3-in-1 Details for Transfer Assistance: cues needed to use her LEs, pt held pillow and stood up; steadying assist initially Ambulation/Gait Ambulation/Gait Assistance: 4: Min guard Ambulation Distance (Feet): 225 Feet Assistive device:  (pushing wheelchair) Ambulation/Gait Assistance Details: Pt needed cues to slow down and stay close to wheelchair occasionally. Gait Pattern: Step-through pattern;Decreased stride length Gait velocity: decreased Stairs: No Wheelchair Mobility Wheelchair Mobility: No         PT Diagnosis: Generalized weakness  PT Problem List: Decreased activity tolerance;Decreased balance;Decreased mobility;Decreased safety awareness;Decreased knowledge of use of DME PT Treatment Interventions: DME instruction;Gait  training;Functional mobility training;Therapeutic activities;Therapeutic exercise;Balance training;Patient/family education   PT Goals Acute Rehab PT Goals PT Goal Formulation: With patient Time For Goal Achievement: 11/15/12 Potential to Achieve Goals: Good Pt will go Supine/Side to Sit: Independently PT Goal: Supine/Side to Sit - Progress: Goal set today Pt will go Sit to Stand: Independently PT Goal: Sit to Stand - Progress: Goal set today Pt will Ambulate: >150 feet;with modified independence;with least restrictive assistive device PT Goal: Ambulate - Progress: Goal set today  Visit Information  Last PT Received On: 11/08/12 Assistance Needed: +1    Subjective Data  Subjective: "I feel weak." Patient Stated Goal: To go home   Prior Functioning  Home Living Lives With: Alone Available Help at Discharge: Available PRN/intermittently (mom can assist) Type of Home: House Home Access: Level entry Home Layout: One level Bathroom Shower/Tub: Health visitor: Standard Home Adaptive Equipment: None Prior Function Level of Independence: Independent Able to Take Stairs?: Yes Driving: Yes Vocation: Full time employment Communication Communication: No difficulties    Cognition  Cognition Overall Cognitive Status: Appears within functional limits for tasks assessed/performed Arousal/Alertness: Awake/alert Orientation Level: Appears intact for tasks assessed Behavior During Session: Flat affect    Extremity/Trunk Assessment Right Lower Extremity Assessment RLE ROM/Strength/Tone: [facility] for tasks assessed Left Lower Extremity Assessment LLE ROM/Strength/Tone: WFL for tasks assessed   Balance Static Standing Balance Static Standing - Balance Support: Bilateral upper extremity supported;During functional activity Static Standing - Level of Assistance: 5: Stand by assistance Static Standing - Comment/# of Minutes: 2 minutes with wheelchair to balance self  End of  Session PT - End of Session Equipment Utilized During Treatment: Gait belt;Oxygen Activity Tolerance: Patient tolerated treatment well Patient left: in chair;with call bell/phone  within reach Nurse Communication: Mobility status       INGOLD,Jaheem [place]path 11/08/2012, 12:36 PM  [facility] Acute Rehabilitation [phone] (pager)

## 2012-11-08 NOTE — Progress Notes (Signed)
3 Days Post-Op Procedure(s) (LRB): CORONARY ARTERY BYPASS GRAFTING (CABG) (N/A) REPLACEMENT ASCENDING AORTA (N/A) REDO STERNOTOMY (N/A) INTRAOPERATIVE TRANSESOPHAGEAL ECHOCARDIOGRAM (N/A) ENDOVEIN HARVEST OF GREATER SAPHENOUS VEIN (Right) Subjective: Redo replacement of the ascending aorta with heme shield graft to the arch from the previously placed valve conduit at the root, combined CABG x2 Patient diuresis well with Lasix drip yesterday and renal dose to wean. Her chest x-ray shows improvement especially on the left. The right lung has some basilar atelectasis or small effusion. She is maintained sinus rhythm and stable blood pressure. Her mental status has improved and she is more alert and engaging. She is walking 100 feet and she passed her swallow test. Mediastinal tube drained 190 cc so we will leave that in today (Bard catheter) Coumadin being resumed for her previously placed St. Jude valve-conduit, INR not yet therapeutic, Coumadin dose 5 mg daily Objective: Vital signs in last 24 hours: Temp:  [97.6 F (36.4 C)-99 F (37.2 C)] 97.9 F (36.6 C) (02/07 0730) Pulse Rate:  [70-103] 92  (02/07 1007) Cardiac Rhythm:  [-] Normal sinus rhythm (02/07 0800) Resp:  [14-26] 24  (02/07 1007) BP: (81-135)/(41-85) 99/60 mmHg (02/07 1006) SpO2:  [94 %-100 %] 98 % (02/07 1007) Weight:  [183 lb 3.2 oz (83.1 kg)] 183 lb 3.2 oz (83.1 kg) (02/07 0600)  Hemodynamic parameters for last 24 hours:   sinus rhythm stable blood pressure  Intake/Output from previous day: 02/06 0701 - 02/07 0700 In: 1425.9 [P.O.:390; I.V.:635.9; IV Piggyback:400] Out: 5820 [Urine:5630; Chest Tube:190] Intake/Output this shift: Total I/O In: 370 [P.O.:260; I.V.:60; IV Piggyback:50] Out: 250 [Urine:250]  Alert and more responsive Breath sounds clear Sharp valve closure sound Peripheral edema improved  Lab Results:  Basename 11/08/12 0345 11/07/12 0350  WBC 17.1* 17.6*  HGB 9.5* 10.9*  HCT 28.3* 31.5*   PLT 95* 107*   BMET:  Basename 11/08/12 0345 11/07/12 1500  NA 140 142  K 3.9 3.7  CL 100 103  CO2 36* 34*  GLUCOSE 92 99  BUN 15 14  CREATININE 0.76 0.76  CALCIUM 9.3 9.5    PT/INR:  Basename 11/08/12 0345  LABPROT 15.4*  INR 1.24   ABG    Component Value Date/Time   PHART 7.385 11/06/2012 1111   HCO3 26.0* 11/06/2012 1111   TCO2 26 11/06/2012 1619   ACIDBASEDEF 1.0 11/05/2012 1921   O2SAT 69.2 11/07/2012 1019   CBG (last 3)   Basename 11/08/12 0729 11/08/12 0449 11/08/12 0352  GLUCAP 79 107* 65*    Assessment/Plan: S/P Procedure(s) (LRB): CORONARY ARTERY BYPASS GRAFTING (CABG) (N/A) REPLACEMENT ASCENDING AORTA (N/A) REDO STERNOTOMY (N/A) INTRAOPERATIVE TRANSESOPHAGEAL ECHOCARDIOGRAM (N/A) ENDOVEIN HARVEST OF GREATER SAPHENOUS VEIN (Right) Plan continue Coumadin loading for the St. Jude aVR-conduit Leave M T. today due to persistent output Wean off to wean and switch from Lasix drip to twice a day IV Lasix dosing    LOS: 4 days    VAN TRIGT III,PETER 11/08/2012

## 2012-11-08 NOTE — Progress Notes (Signed)
Peripherally Inserted Central Catheter/Midline Placement  The IV Nurse has discussed with the patient and/or persons authorized to consent for the patient, the purpose of this procedure and the potential benefits and risks involved with this procedure.  The benefits include less needle sticks, lab draws from the catheter and patient may be discharged home with the catheter.  Risks include, but not limited to, infection, bleeding, blood clot (thrombus formation), and puncture of an artery; nerve damage and irregular heat beat.  Alternatives to this procedure were also discussed.  PICC/Midline Placement Documentation  PICC / Midline Double Lumen 11/08/12 PICC Left Basilic (Active)       Tamya Denardo, Lajean Manes 11/08/2012, 9:25 PM

## 2012-11-08 NOTE — Progress Notes (Signed)
25ml IV dextrose given.  Repeat cbg 107.

## 2012-11-08 NOTE — Progress Notes (Signed)
0400 cbg 65.  Question patient's ablility to safely take PO's.  Will give 25ml of D50 and recheck cbg.

## 2012-11-08 NOTE — Progress Notes (Signed)
Hypoglycemic Event  CBG:69  Treatment: D50 IV 25 mL  Symptoms: None  Follow-up CBG: Time1630 CBG Result:104  Possible Reasons for Event: Inadequate meal intake and Medication regimen: levmir 10u  Comments/MD notified:Dr. Maren Beach  Notified order receive to D/C levmir.    Dell Ponto  Remember to initiate Hypoglycemia Order Set & complete

## 2012-11-08 NOTE — Evaluation (Signed)
Clinical/Bedside Swallow Evaluation Patient Details  Name: [name] MRN: [id number] Date of Birth: Jun 13, 1951  Today's Date: 11/08/2012 Time: 5784-6962 SLP Time Calculation (min): 14 min  Past Medical History:  Past Medical History  Diagnosis Date  . Coronary artery disease 2000    aortic mechanical valve replacement  . Anginal pain   . Shortness of breath    Past Surgical History:  Past Surgical History  Procedure Date  . Vascular surgery   . Coronary artery bypass graft 11/05/2012    Procedure: CORONARY ARTERY BYPASS GRAFTING (CABG);  Surgeon: Kerin Perna, MD;  Location: [facility] OR;  Service: Open Heart Surgery;  Laterality: N/A;  cannulate right subclavian  . Replacement ascending aorta 11/05/2012    Procedure: REPLACEMENT ASCENDING AORTA;  Surgeon: Kerin Perna, MD;  Location: [facility] OR;  Service: Open Heart Surgery;  Laterality: N/A;  . Intraoperative transesophageal echocardiogram 11/05/2012    Procedure: INTRAOPERATIVE TRANSESOPHAGEAL ECHOCARDIOGRAM;  Surgeon: Kerin Perna, MD;  Location: [facility] OR;  Service: Open Heart Surgery;  Laterality: N/A;  . Endovein harvest of greater saphenous vein 11/05/2012    Procedure: ENDOVEIN HARVEST OF GREATER SAPHENOUS VEIN;  Surgeon: Kerin Perna, MD;  Location: [facility] OR;  Service: Open Heart Surgery;  Laterality: Right;   HPI:  62 y.o. female with a history of St. 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  Pt underwent Cardiac cath on 11/04/12 and coronary artery bypass grafting as well as ascending aortic replacement on 11/05/12. extubated next day.    Assessment / Plan / Recommendation Clinical Impression  Pt demonstrates normal oral and oropharyngeal function with no signs concerning for aspiration. Pt able to  consume regular textures without difficutly. Recommend pt advance to a regular texture diet with thin liquids, Heart Healthy per MD. No SLP f/u needed at this time. Thank you for this referral.     Aspiration Risk  None    Diet Recommendation Regular;Thin liquid   Liquid Administration via: Cup;Straw Medication Administration: Whole meds with liquid Supervision: Patient able to self feed Postural Changes and/or Swallow Maneuvers: Upright 30-60 min after meal;Out of bed for meals    Other  Recommendations Oral Care Recommendations: Oral care BID   Follow Up Recommendations  None    Frequency and Duration        Pertinent Vitals/Pain NA    SLP Swallow Goals     Swallow Study Prior Functional Status       General HPI: 62 y.o. female with a history of St. 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  Pt underwent Cardiac cath on 11/04/12 and coronary artery bypass grafting as well as ascending aortic replacement on 11/05/12. extubated next day.  Type of Study: Bedside swallow evaluation Diet Prior to this Study: Thin liquids Temperature Spikes Noted: No Respiratory Status: Supplemental O2 delivered via (comment) History of Recent Intubation: Yes Length of Intubations (days): 2 days Date extubated: 11/06/12 Behavior/Cognition: Alert;Cooperative;Pleasant mood Oral Cavity - Dentition: Adequate natural dentition Self-Feeding Abilities: Able to feed self Patient Positioning: Upright in chair Baseline Vocal Quality: Clear Volitional Cough:  Strong Volitional Swallow: Able to elicit    Oral/Motor/Sensory Function Overall Oral Motor/Sensory Function: Appears within functional limits for tasks assessed   Ice Chips     Thin Liquid Thin Liquid: Within  functional limits Presentation: Cup;Straw    Nectar Thick Nectar Thick Liquid: Not tested   Honey Thick Honey Thick Liquid: Not tested   Puree Puree: Within functional limits Presentation: Self Fed   Solid   GO    Solid: Within functional limits Presentation: Self Fed      Harlon Ditty, MA CCC-SLP [phone]  Claudine Mouton 11/08/2012,8:42 AM

## 2012-11-08 NOTE — Progress Notes (Signed)
Subjective:  Feeling better today.  More alert.  No major c/o.  Objective:  Vital Signs in the last 24 hours: Temp:  [97.6 F (36.4 C)-99 F (37.2 C)] 97.9 F (36.6 C) (02/07 0730) Pulse Rate:  [70-103] 80  (02/07 0800) Resp:  [14-26] 14  (02/07 0800) BP: (81-135)/(41-85) 106/64 mmHg (02/07 0800) SpO2:  [94 %-100 %] 99 % (02/07 0800) Arterial Line BP: (140-163)/(44-54) 140/54 mmHg (02/06 1000) Weight:  [83.1 kg (183 lb 3.2 oz)] 83.1 kg (183 lb 3.2 oz) (02/07 0600)  Intake/Output from previous day: 02/06 0701 - 02/07 0700 In: 1425.9 [P.O.:390; I.V.:635.9; IV Piggyback:400] Out: 5820 [Urine:5630; Chest Tube:190] Intake/Output from this shift: Total I/O In: 40 [P.O.:20; I.V.:20] Out: 150 [Urine:150]  Physical Exam: General appearance: alert, cooperative, no distress and tired, but alert Neck: no adenopathy, no carotid bruit, no JVD and RIJ line still in place (plan to d/c today) Lungs: clear to auscultation bilaterally, normal percussion bilaterally and just diminised sounds t/o Heart: regular rate and rhythm and soft SEM @ base, no rub or gallop Abdomen: soft, non-tender; bowel sounds normal; no masses,  no organomegaly Extremities: extremities normal, atraumatic, no cyanosis or edema Pulses: 2+ and symmetric Neurologic: Grossly normal  Lab Results:  Basename 11/08/12 0345 11/07/12 0350  WBC 17.1* 17.6*  HGB 9.5* 10.9*  PLT 95* 107*    Basename 11/08/12 0345 11/07/12 1500  NA 140 142  K 3.9 3.7  CL 100 103  CO2 36* 34*  GLUCOSE 92 99  BUN 15 14  CREATININE 0.76 0.76   Hepatic Function Panel  Basename 11/08/12 0345  PROT 5.6*  ALBUMIN 3.0*  AST 46*  ALT 28  ALKPHOS 48  BILITOT 1.2  BILIDIR --  IBILI --   MAR Reviewed:  Lasix gtt -- plan to be weaned off today, convert to PO Dopamine gtt to be weaned off as well.  Imaging: CXR: IMPRESSION:  Overall improvement in aeration with persistent right lower lobe atelectasis and pleural fluid.   Cardiac  Studies: - Left ventricle: The cavity size was normal. Wall thickness was increased in a pattern of mild LVH. Systolic function was normal. The estimated ejection fraction was in the range of 60% to 65%. Wall motion was normal; there were no regional wall motion abnormalities. LV diastolic function parameters were normal. - Ventricular septum: Septal motion showed paradox.  - Aortic valve: A mechanical prosthesis was present. Valve area: 1.38cm^2(VTI). Valve area: 0.99cm^2 (Vmax). - Atrial septum: No defect or patent foramen ovale was identified.  Assessment/Plan:  S/P CABG x 2 (SVG-OM, SVG-LAD), and S/P ascending aorta replacement. POD# 3. She was extubated 2/5.  Remains a bit groggy, but significantly improved from yesterday.  Swallow study today. Weaning pressors & Lasix gtt today - has had significant UOP on lasix gtt + Dopamine.   Anticipate CT out today as well.  Slow but stable progress post-op, remains in NSR with no active cardiac issues on PO Amiodarone & low dose BB.  We will follow along.  Will eventually benefit from ACE-I once BP stabilizes. Restarted Warfarin - but no further Afib.       LOS: 4 days    HARDING,DAVID W 11/08/2012, 8:24 AM

## 2012-11-09 ENCOUNTER — Inpatient Hospital Stay (HOSPITAL_COMMUNITY): Payer: PRIVATE HEALTH INSURANCE

## 2012-11-09 LAB — CBC
HCT: 26.5 % — ABNORMAL LOW (ref 36.0–46.0)
Hemoglobin: 9 g/dL — ABNORMAL LOW (ref 12.0–15.0)
MCH: 27.5 pg (ref 26.0–34.0)
MCHC: 34 g/dL (ref 30.0–36.0)
MCV: 81 fL (ref 78.0–100.0)
Platelets: 112 10*3/uL — ABNORMAL LOW (ref 150–400)
RBC: 3.27 MIL/uL — ABNORMAL LOW (ref 3.87–5.11)
RDW: 16.1 % — ABNORMAL HIGH (ref 11.5–15.5)
WBC: 15.7 10*3/uL — ABNORMAL HIGH (ref 4.0–10.5)

## 2012-11-09 LAB — BASIC METABOLIC PANEL
BUN: 20 mg/dL (ref 6–23)
CO2: 36 mEq/L — ABNORMAL HIGH (ref 19–32)
Calcium: 9 mg/dL (ref 8.4–10.5)
Chloride: 97 mEq/L (ref 96–112)
Creatinine, Ser: 0.86 mg/dL (ref 0.50–1.10)
GFR calc Af Amer: 83 mL/min — ABNORMAL LOW (ref >90)
GFR calc non Af Amer: 72 mL/min — ABNORMAL LOW (ref >90)
Glucose, Bld: 96 mg/dL (ref 70–99)
Potassium: 3.4 mEq/L — ABNORMAL LOW (ref 3.5–5.1)
Sodium: 139 mEq/L (ref 135–145)

## 2012-11-09 LAB — GLUCOSE, CAPILLARY
Glucose-Capillary: 92 mg/dL (ref 70–99)
Glucose-Capillary: 89 mg/dL (ref 70–99)

## 2012-11-09 LAB — PROTIME-INR: Prothrombin Time: 17.4 seconds — ABNORMAL HIGH (ref 11.6–15.2)

## 2012-11-09 MED ORDER — POTASSIUM CHLORIDE 10 MEQ/50ML IV SOLN
10.0000 meq | INTRAVENOUS | Status: AC
  Administered 2012-11-09 (×2): 10 meq via INTRAVENOUS
  Filled 2012-11-09: qty 100

## 2012-11-09 MED ORDER — INSULIN ASPART 100 UNIT/ML ~~LOC~~ SOLN: 0.0000 [IU] | Freq: Three times a day (TID) | SUBCUTANEOUS | Status: DC

## 2012-11-09 NOTE — Progress Notes (Signed)
Doesn't feel as well thia afternoon  Says she feels tired  BP 116/64  Pulse 102  Temp(Src) 97.9 F (36.6 C) (Oral)  Resp 21  Ht 5\' 7"  (1.702 m)  Wt 179 lb 10.8 oz (81.5 kg)  BMI 28.13 kg/m2  SpO2 99%   Intake/Output Summary (Last 24 hours) at 11/09/12 1751 Last data filed at 11/09/12 1700  Gross per 24 hour  Intake   1044 ml  Output   2035 ml  Net   -991 ml    Diuresing well  Continue current care

## 2012-11-09 NOTE — Progress Notes (Signed)
4 Days Post-Op Procedure(s) (LRB): cannulate right subclavian (N/A) REPLACEMENT ASCENDING AORTA (N/A) REDO STERNOTOMY (N/A) INTRAOPERATIVE TRANSESOPHAGEAL ECHOCARDIOGRAM (N/A) ENDOVEIN HARVEST OF GREATER SAPHENOUS VEIN (Right) Subjective: Feels better today, some chest discomfort  Objective: Vital signs in last 24 hours: Temp:  [97.9 F (36.6 C)-98.3 F (36.8 C)] 97.9 F (36.6 C) (02/08 0726) Pulse Rate:  [80-107] 86 (02/08 0600) Cardiac Rhythm:  [-] Normal sinus rhythm (02/08 0800) Resp:  [12-24] 13 (02/08 0600) BP: (78-134)/(41-87) 114/50 mmHg (02/08 0600) SpO2:  [90 %-100 %] 100 % (02/08 0600) Weight:  [179 lb 10.8 oz (81.5 kg)] 179 lb 10.8 oz (81.5 kg) (02/08 0500)  Hemodynamic parameters for last 24 hours:    Intake/Output from previous day: 02/07 0701 - 02/08 0700 In: 1280 [P.O.:620; I.V.:500; IV Piggyback:160] Out: 2655 [Urine:2625; Chest Tube:30] Intake/Output this shift: Total I/O In: 20 [I.V.:20] Out: -   General appearance: alert and no distress Neurologic: no focal deficit Heart: regular rate and rhythm Lungs: diminished breath sounds bibasilar  Lab Results:  Recent Labs  11/08/12 0345 11/09/12 0248  WBC 17.1* 15.7*  HGB 9.5* 9.0*  HCT 28.3* 26.5*  PLT 95* 112*   BMET:  Recent Labs  11/08/12 0345 11/09/12 0248  NA 140 139  K 3.9 3.4*  CL 100 97  CO2 36* 36*  GLUCOSE 92 96  BUN 15 20  CREATININE 0.76 0.86  CALCIUM 9.3 9.0    PT/INR:  Recent Labs  11/09/12 0248  LABPROT 17.4*  INR 1.47   ABG    Component Value Date/Time   PHART 7.385 11/06/2012 1111   HCO3 26.0* 11/06/2012 1111   TCO2 26 11/06/2012 1619   ACIDBASEDEF 1.0 11/05/2012 1921   O2SAT 69.2 11/07/2012 1019   CBG (last 3)   Recent Labs  11/08/12 2317 11/09/12 0302 11/09/12 0723  GLUCAP 100* 92 89    Assessment/Plan: S/P Procedure(s) (LRB): cannulate right subclavian (N/A) REPLACEMENT ASCENDING AORTA (N/A) REDO STERNOTOMY (N/A) INTRAOPERATIVE TRANSESOPHAGEAL  ECHOCARDIOGRAM (N/A) ENDOVEIN HARVEST OF GREATER SAPHENOUS VEIN (Right) POD # 4 CABG ascending aortic repair CV- stable- coumadin for mechanical valve  RESP- pulmonary hygiene  RENAL-  Volume overload- diurese, hypokalemia- supplement  ENDO- CBG well controlled change to [facility] and HS  Mobilize  LOS: 5 days    Angelik Walls C 11/09/2012

## 2012-11-09 NOTE — Progress Notes (Signed)
[facility] Progress Note  Subjective:  Day 4 s/p CABG x2 and ascending aorta replacement.  Objective:   Vital Signs in the last 24 hours: Temp:  [97.9 F (36.6 C)-98.3 F (36.8 C)] 97.9 F (36.6 C) (02/08 0726) Pulse Rate:  [80-107] 85 (02/08 0800) Resp:  [12-24] 13 (02/08 0800) BP: (78-134)/(41-87) 114/50 mmHg (02/08 0600) SpO2:  [90 %-100 %] 100 % (02/08 0800) Weight:  [81.5 kg (179 lb 10.8 oz)] 81.5 kg (179 lb 10.8 oz) (02/08 0500)  Intake/Output from previous day: 02/07 0701 - 02/08 0700 In: 1280 [P.O.:620; I.V.:500; IV Piggyback:160] Out: 2655 [Urine:2625; Chest Tube:30]  Scheduled: . acetaminophen  1,000 mg Oral Q6H   Or  . acetaminophen (TYLENOL) oral liquid 160 mg/5 mL  975 mg Per Tube Q6H  . amiodarone  200 mg Oral BID  . aspirin EC  325 mg Oral Daily   Or  . aspirin  324 mg Per Tube Daily  . atorvastatin  40 mg Oral q1800  . bisacodyl  10 mg Oral Daily   Or  . bisacodyl  10 mg Rectal Daily  . docusate sodium  200 mg Oral Daily  . enoxaparin  40 mg Subcutaneous Q24H  . furosemide  40 mg Intravenous BID  . insulin aspart  0-15 Units Subcutaneous TID WC  . metoprolol tartrate  12.5 mg Oral BID  . pantoprazole  40 mg Oral Daily  . potassium chloride  10 mEq Intravenous Q1 Hr x 2  . sodium chloride  10-40 mL Intracatheter Q12H  . sodium chloride  3 mL Intravenous Q12H  . warfarin  5 mg Oral q1800  . Warfarin - Physician Dosing Inpatient   Does not apply q1800   . sodium chloride 20 mL (11/08/12 2304)    Physical Exam:   General appearance: alert, cooperative and no distress Neck: no JVD and supple, symmetrical, trachea midline Lungs: decreased BS at bases Heart: regular rate and rhythm and 2/6 sem Abdomen: soft, non-tender; bowel sounds normal; no masses,  no organomegaly Extremities: no edema, redness or tenderness in the calves or thighs   Rate: 90  Rhythm: normal sinus rhythm  Lab Results:    Recent Labs   11/08/12 0345 11/09/12 0248  NA 140 139  K 3.9 3.4*  CL 100 97  CO2 36* 36*  GLUCOSE 92 96  BUN 15 20  CREATININE 0.76 0.86   No results found for this basename: TROPONINI, CK, MB,  in the last 72 hours Hepatic Function Panel  Recent Labs  11/08/12 0345  PROT 5.6*  ALBUMIN 3.0*  AST 46*  ALT 28  ALKPHOS 48  BILITOT 1.2    Recent Labs  11/09/12 0248  INR 1.47    Lipid Panel  No results found for this basename: chol, trig, hdl, cholhdl, vldl, ldlcalc     Imaging:  Dg Chest 2 View  11/09/2012  *RADIOLOGY REPORT*  Clinical Data: Status post aortic valve replacement  CHEST - 2 VIEW  Comparison: Prior chest x-ray 11/08/2012  Findings: The right IJ vascular sheath and left upper extremity approach PICC are in unchanged position terminating in the mid superior vena cava and cavoatrial junction respectively.  The mediastinal drain remains unchanged position.  Status post median sternotomy with evidence of prior CABG and aortic valve replacement.  Epicardial pacing leads are noted.  Improved inspiratory volumes with decreased right-sided pleural effusion and persistent right basilar atelectasis.  Negative for edema.  No pneumothorax.  IMPRESSION:  1.  Improved inspiratory volumes with decreasing right pleural effusion and related atelectasis. 2.  Stable and satisfactory support apparatus.   Original Report Authenticated By: Malachy Moan, M.D.    Dg Chest Port 1 View  11/08/2012  *RADIOLOGY REPORT*  Clinical Data: PICC line placement  PORTABLE CHEST - 1 VIEW  Comparison: 11/08/2012 at 0554 hours  Findings: Left arm PICC terminates in the lower SVC.  No pneumothorax.  Otherwise unchanged.  Cardiomegaly with layering right pleural effusion and probable underlying atelectasis.  Median sternotomy.  Right IJ venous sheath.  Mediastinal drain.  Epicardial pacing wires.  IMPRESSION: Left arm PICC terminates in the lower SVC.  No pneumothorax.  Otherwise unchanged.   Original Report  Authenticated By: Charline Bills, M.D.    Dg Chest Port 1 View  11/08/2012   *RADIOLOGY REPORT*  Clinical Data: Post CABG  PORTABLE CHEST - 1 VIEW  Comparison: 11/07/2012  Findings: Slightly improved overall lung volumes are noted.  A left- sided chest tube has been removed.  Patient is status post median sternotomy and CABG.  Mediastinal drain, right internal jugular Swan-Ganz catheter introducer sheath and epicardial pacer wires remain unchanged.  Heart and mediastinal contours are stable with heart upper limits of normal and mild ectasia of the thoracic aorta with calcification.  No pneumothorax is seen.  There is a right pleural effusion with increased density at the right lung base felt likely due to pleural effusion and associated atelectasis.  This shows no interval change since the previous exam.  No new infiltrates or signs of congestive failure are evident.  IMPRESSION: Overall improvement in aeration with persistent right lower lobe atelectasis and pleural fluid.   Original Report Authenticated By: Rhodia Albright, M.D.       Assessment/Plan:   Principal Problem:   Unstable angina Active Problems:   CAD-severe LM disease- S/P CABG X 2 (SVG-OM, SVG-LAD) 11/05/12   S/P AVR, St Jude 2000   Dilated ascending aorta at cath 11/03/12 - S/P root replacement 11/05/12   Chronic anticoagulation   Contrast media allergy   HTN (hypertension)   Sarcoidosis   More alert. Rhythm stable without recurrent AF. Still with some drainage via mediastinal tube. Lost 1.6 kg since yesterday, but still 3.5 kg increase since pre-op.   Lennette Bihari, MD, [facility] 11/09/2012, 8:48 AM

## 2012-11-10 ENCOUNTER — Inpatient Hospital Stay (HOSPITAL_COMMUNITY): Payer: PRIVATE HEALTH INSURANCE

## 2012-11-10 LAB — CBC
HCT: 23.2 % — ABNORMAL LOW (ref 36.0–46.0)
Hemoglobin: 7.9 g/dL — ABNORMAL LOW (ref 12.0–15.0)
MCHC: 34.1 g/dL (ref 30.0–36.0)
RBC: 2.83 MIL/uL — ABNORMAL LOW (ref 3.87–5.11)
WBC: 14.6 10*3/uL — ABNORMAL HIGH (ref 4.0–10.5)

## 2012-11-10 LAB — BASIC METABOLIC PANEL
BUN: 18 mg/dL (ref 6–23)
CO2: 34 mEq/L — ABNORMAL HIGH (ref 19–32)
Chloride: 99 mEq/L (ref 96–112)
Glucose, Bld: 112 mg/dL — ABNORMAL HIGH (ref 70–99)
Potassium: 3.8 mEq/L (ref 3.5–5.1)

## 2012-11-10 LAB — GLUCOSE, CAPILLARY
Glucose-Capillary: 112 mg/dL — ABNORMAL HIGH (ref 70–99)
Glucose-Capillary: 130 mg/dL — ABNORMAL HIGH (ref 70–99)

## 2012-11-10 LAB — PROTIME-INR: INR: 1.93 — ABNORMAL HIGH (ref 0.00–1.49)

## 2012-11-10 NOTE — Progress Notes (Signed)
Stable day  No new issues  BP 127/69  Pulse 85  Temp(Src) 98 F (36.7 C) (Oral)  Resp 16  Ht 5\' 7"  (1.702 m)  Wt 167 lb 12.3 oz (76.1 kg)  BMI 26.27 kg/m2  SpO2 100%   Intake/Output Summary (Last 24 hours) at 11/10/12 1825 Last data filed at 11/10/12 1700  Gross per 24 hour  Intake   1102 ml  Output   1765 ml  Net   -663 ml

## 2012-11-10 NOTE — Progress Notes (Signed)
5 Days Post-Op Procedure(s) (LRB): cannulate right subclavian (N/A) REPLACEMENT ASCENDING AORTA (N/A) REDO STERNOTOMY (N/A) INTRAOPERATIVE TRANSESOPHAGEAL ECHOCARDIOGRAM (N/A) ENDOVEIN HARVEST OF GREATER SAPHENOUS VEIN (Right) Subjective: Does not feel well  Objective: Vital signs in last 24 hours: Temp:  [97.6 F (36.4 C)-97.9 F (36.6 C)] 97.9 F (36.6 C) (02/09 0725) Pulse Rate:  [83-108] 91 (02/09 0800) Cardiac Rhythm:  [-] Normal sinus rhythm (02/08 2300) Resp:  [14-27] 17 (02/09 0800) BP: (102-138)/(45-81) 129/67 mmHg (02/09 0800) SpO2:  [93 %-100 %] 100 % (02/09 0800) Weight:  [167 lb 12.3 oz (76.1 kg)] 167 lb 12.3 oz (76.1 kg) (02/09 0500)  Hemodynamic parameters for last 24 hours:    Intake/Output from previous day: 02/08 0701 - 02/09 0700 In: 1260 [P.O.:720; I.V.:440; IV Piggyback:100] Out: 1750 [Urine:1730; Chest Tube:20] Intake/Output this shift:    General appearance: alert and no distress Neurologic: intact Heart: regular rate and rhythm Lungs: diminished breath sounds bibasilar Wound: clean and dry  Lab Results:  Recent Labs  11/09/12 0248 11/10/12 0410  WBC 15.7* 14.6*  HGB 9.0* 7.9*  HCT 26.5* 23.2*  PLT 112* 142*   BMET:  Recent Labs  11/09/12 0248 11/10/12 0410  NA 139 139  K 3.4* 3.8  CL 97 99  CO2 36* 34*  GLUCOSE 96 112*  BUN 20 18  CREATININE 0.86 0.74  CALCIUM 9.0 8.9    PT/INR:  Recent Labs  11/10/12 0410  LABPROT 21.3*  INR 1.93*   ABG    Component Value Date/Time   PHART 7.385 11/06/2012 1111   HCO3 26.0* 11/06/2012 1111   TCO2 26 11/06/2012 1619   ACIDBASEDEF 1.0 11/05/2012 1921   O2SAT 69.2 11/07/2012 1019   CBG (last 3)   Recent Labs  11/09/12 1723 11/09/12 2135 11/10/12 0718  GLUCAP 88 112* 112*    Assessment/Plan: S/P Procedure(s) (LRB): cannulate right subclavian (N/A) REPLACEMENT ASCENDING AORTA (N/A) REDO STERNOTOMY (N/A) INTRAOPERATIVE TRANSESOPHAGEAL ECHOCARDIOGRAM (N/A) ENDOVEIN HARVEST OF  GREATER SAPHENOUS VEIN (Right) - POD # 5 CABG. Ascending aneurysm repair under circ arrest  CV- stable  RESP- bilateral effusions and bibasilar atelectasis- diurese, pulmonary hygiene  RENAL- continue BID IV lasix  deconditioning- OOB, ambulate  CBG normal - d/c CBG, SSI     LOS: 6 days    HENDRICKSON,STEVEN C 11/10/2012

## 2012-11-10 NOTE — Progress Notes (Signed)
Patient is sleeping comfortably, does not appearing in distress.  Current vitals are 127/56, HR 83, resp 17, 100% on 4L of oxygen.

## 2012-11-10 NOTE — Progress Notes (Signed)
Pt stated " I feel like I can't breathe, can't catch my breath." Assess patient, auscultated breath sounds: clear in the upper lobes, diminished in lower lobes, equal through out and bilateral.  Patient had RR in the 16-18 range and O@ sats were 100 on 2L nasal cannula.   Oxygen rate was increased to 4L and patient was placed in high fowlers position. Scheduled tylenol was also given to help alleviate incisional pain, allowing patient to breathe deeper.  Incentive Spirometer was also encouraged.  Will continue to assess the patient   

## 2012-11-10 NOTE — Progress Notes (Deleted)
Pt stated " I feel like I can't breathe, can't catch my breath." Assess patient, auscultated breath sounds: clear in the upper lobes, diminished in lower lobes, equal through out and bilateral.  Patient had RR in the 16-18 range and O@ sats were 100 on 2L nasal cannula.   Oxygen rate was increased to 4L and patient was placed in high fowlers position. Scheduled tylenol was also given to help alleviate incisional pain, allowing patient to breathe deeper.  Incentive Spirometer was also encouraged.  Will continue to assess the patient

## 2012-11-10 NOTE — Progress Notes (Signed)
[facility] Progress Note  Subjective:  Day 5 s/p CABG and ascending aorta replacement  Objective:   Vital Signs in the last 24 hours: Temp:  [97.6 F (36.4 C)-97.9 F (36.6 C)] 97.9 F (36.6 C) (02/09 0725) Pulse Rate:  [83-108] 91 (02/09 0800) Resp:  [14-27] 17 (02/09 0800) BP: (102-138)/(45-81) 129/67 mmHg (02/09 0800) SpO2:  [93 %-100 %] 100 % (02/09 0800) Weight:  [76.1 kg (167 lb 12.3 oz)] 76.1 kg (167 lb 12.3 oz) (02/09 0500)  Intake/Output from previous day: 02/08 0701 - 02/09 0700 In: 1260 [P.O.:720; I.V.:440; IV Piggyback:100] Out: 1750 [Urine:1730; Chest Tube:20]  Scheduled: . acetaminophen  1,000 mg Oral Q6H   Or  . acetaminophen (TYLENOL) oral liquid 160 mg/5 mL  975 mg Per Tube Q6H  . amiodarone  200 mg Oral BID  . aspirin EC  325 mg Oral Daily   Or  . aspirin  324 mg Per Tube Daily  . atorvastatin  40 mg Oral q1800  . bisacodyl  10 mg Oral Daily   Or  . bisacodyl  10 mg Rectal Daily  . docusate sodium  200 mg Oral Daily  . enoxaparin  40 mg Subcutaneous Q24H  . furosemide  40 mg Intravenous BID  . metoprolol tartrate  12.5 mg Oral BID  . pantoprazole  40 mg Oral Daily  . sodium chloride  10-40 mL Intracatheter Q12H  . sodium chloride  3 mL Intravenous Q12H  . warfarin  5 mg Oral q1800  . Warfarin - Physician Dosing Inpatient   Does not apply q1800   . sodium chloride 20 mL (11/08/12 2304)   Physical Exam:   General appearance: alert, cooperative, no distress but weak Neck: no JVD and supple, symmetrical, trachea midline Lungs: decreased BS bases Heart: RRR with occcasional PVC's, crisp prothetic AVR with 2/6 sem Abdomen: soft, non-tender; bowel sounds normal; no masses,  no organomegaly Extremities: no edema, redness or tenderness in the calves or thighs Neuro: intact   Rate: 90's with occasional PVC's  Rhythm: normal sinus rhythm  Lab Results:    Recent Labs  11/09/12 0248 11/10/12 0410  NA 139 139  K  3.4* 3.8  CL 97 99  CO2 36* 34*  GLUCOSE 96 112*  BUN 20 18  CREATININE 0.86 0.74   CBC    Component Value Date/Time   WBC 14.6* 11/10/2012 0410   RBC 2.83* 11/10/2012 0410   HGB 7.9* 11/10/2012 0410   HCT 23.2* 11/10/2012 0410   PLT 142* 11/10/2012 0410   MCV 82.0 11/10/2012 0410   MCH 27.9 11/10/2012 0410   MCHC 34.1 11/10/2012 0410   RDW 16.0* 11/10/2012 0410    No results found for this basename: TROPONINI, CK, MB,  in the last 72 hours Hepatic Function Panel  Recent Labs  11/08/12 0345  PROT 5.6*  ALBUMIN 3.0*  AST 46*  ALT 28  ALKPHOS 48  BILITOT 1.2    Recent Labs  11/10/12 0410  INR 1.93*    Lipid Panel  No results found for this basename: chol, trig, hdl, cholhdl, vldl, ldlcalc     Imaging:  Dg Chest 2 View  11/09/2012  *RADIOLOGY REPORT*  Clinical Data: Status post aortic valve replacement  CHEST - 2 VIEW  Comparison: Prior chest x-ray 11/08/2012  Findings: The right IJ vascular sheath and left upper extremity approach PICC are in unchanged position terminating in the mid superior vena cava and cavoatrial junction respectively.  The mediastinal  drain remains unchanged position.  Status post median sternotomy with evidence of prior CABG and aortic valve replacement.  Epicardial pacing leads are noted.  Improved inspiratory volumes with decreased right-sided pleural effusion and persistent right basilar atelectasis.  Negative for edema.  No pneumothorax.  IMPRESSION:  1.  Improved inspiratory volumes with decreasing right pleural effusion and related atelectasis. 2.  Stable and satisfactory support apparatus.   Original Report Authenticated By: Malachy Moan, M.D.    Dg Chest Port 1 View  11/10/2012  *RADIOLOGY REPORT*  Clinical Data: Follow-up CHF  PORTABLE CHEST - 1 VIEW  Comparison: Prior chest x-ray 11/09/2012  Findings: Interval removal of right IJ vascular sheath and subxiphoid mediastinal drain.  Left upper extremity approach PICC remains in unchanged position with  the tip at the superior cavoatrial junction.  Interval decrease in inspiratory volumes with enlarging bilateral pleural effusions and worsening bibasilar opacities.  Additionally, there is increased pulmonary vascular congestion.  Stable cardiomegaly and widening of the mediastinal contour.  IMPRESSION:  1.  Enlarging bilateral pleural effusions worsening bibasilar opacities and increased pulmonary vascular congestion suggests worsening CHF versus volume overload.  2.  Interval removal of right IJ vascular sheath and sub xyphoid mediastinal drain.   Original Report Authenticated By: Malachy Moan, M.D.    Dg Chest Port 1 View  11/08/2012  *RADIOLOGY REPORT*  Clinical Data: PICC line placement  PORTABLE CHEST - 1 VIEW  Comparison: 11/08/2012 at 0554 hours  Findings: Left arm PICC terminates in the lower SVC.  No pneumothorax.  Otherwise unchanged.  Cardiomegaly with layering right pleural effusion and probable underlying atelectasis.  Median sternotomy.  Right IJ venous sheath.  Mediastinal drain.  Epicardial pacing wires.  IMPRESSION: Left arm PICC terminates in the lower SVC.  No pneumothorax.  Otherwise unchanged.   Original Report Authenticated By: Charline Bills, M.D.       Assessment/Plan:   Principal Problem:   Unstable angina Active Problems:   CAD-severe LM disease- S/P CABG X 2 (SVG-OM, SVG-LAD) 11/05/12   S/P AVR, St Jude 2000   Dilated ascending aorta at cath 11/03/12 - S/P root replacement 11/05/12   Chronic anticoagulation   Contrast media allergy   HTN (hypertension)   Sarcoidosis   Slowly progressing. Will titrate metoprolol to 25 mg bid for ectopy suppression. Continue diuresis and pulmonary toilet.   Lennette Bihari, MD, [facility] 11/10/2012, 9:14 AM

## 2012-11-11 ENCOUNTER — Inpatient Hospital Stay (HOSPITAL_COMMUNITY): Payer: PRIVATE HEALTH INSURANCE

## 2012-11-11 LAB — CBC
MCH: 28.6 pg (ref 26.0–34.0)
MCHC: 34.5 g/dL (ref 30.0–36.0)
MCV: 82.9 fL (ref 78.0–100.0)
Platelets: 162 10*3/uL (ref 150–400)

## 2012-11-11 LAB — BASIC METABOLIC PANEL
Calcium: 9 mg/dL (ref 8.4–10.5)
Creatinine, Ser: 0.77 mg/dL (ref 0.50–1.10)
GFR calc non Af Amer: 89 mL/min — ABNORMAL LOW (ref >90)
Sodium: 141 mEq/L (ref 135–145)

## 2012-11-11 LAB — PROTIME-INR: Prothrombin Time: 33.8 seconds — ABNORMAL HIGH (ref 11.6–15.2)

## 2012-11-11 LAB — PREPARE RBC (CROSSMATCH)

## 2012-11-11 NOTE — Progress Notes (Signed)
  Echocardiogram 2D Echocardiogram has been performed.  [place] 11/11/2012, 5:40 PM

## 2012-11-11 NOTE — Progress Notes (Addendum)
[facility]  Subjective: Day 6 s/p CABG and ascending aorta replacement. No complaints.   Objective: Vital signs in last 24 hours: Temp:  [97.2 F (36.2 C)-98 F (36.7 C)] 97.9 F (36.6 C) (02/10 0823) Pulse Rate:  [80-96] 85 (02/10 0700) Resp:  [13-27] 27 (02/10 0700) BP: (95-140)/(42-95) 137/64 mmHg (02/10 0700) SpO2:  [94 %-100 %] 100 % (02/10 0700) Weight:  [176 lb 2.4 oz (79.9 kg)] 176 lb 2.4 oz (79.9 kg) (02/10 0500) Last BM Date: 11/11/11  Intake/Output from previous day: 02/09 0701 - 02/10 0700 In: 696 [P.O.:200; I.V.:480; IV Piggyback:16] Out: 1980 [Urine:1980] Intake/Output this shift:    Medications Current Facility-Administered Medications  Medication Dose Route Frequency Provider Last Rate Last Dose  . 0.9 %  sodium chloride infusion   Intravenous Continuous Kerin Perna, MD 20 mL/hr at 11/08/12 2304 20 mL at 11/08/12 2304  . 0.9 %  sodium chloride infusion  250 mL Intravenous PRN Rowe Clack, PA      . amiodarone (PACERONE) tablet 200 mg  200 mg Oral BID Kerin Perna, MD   200 mg at 11/10/12 2221  . aspirin EC tablet 325 mg  325 mg Oral Daily Rowe Clack, [place]   325 mg at 11/10/12 1610   Or  . aspirin chewable tablet 324 mg  324 mg Per Tube Daily Rowe Clack, PA      . atorvastatin (LIPITOR) tablet 40 mg  40 mg Oral q1800 Rowe Clack, PA   40 mg at 11/10/12 1810  . bisacodyl (DULCOLAX) EC tablet 10 mg  10 mg Oral Daily Rowe Clack, [place]   10 mg at 11/08/12 1208   Or  . bisacodyl (DULCOLAX) suppository 10 mg  10 mg Rectal Daily Rowe Clack, PA      . docusate sodium (COLACE) capsule 200 mg  200 mg Oral Daily Rowe Clack, PA   200 mg at 11/08/12 1208  . furosemide (LASIX) injection 40 mg  40 mg Intravenous BID Kerin Perna, MD   40 mg at 11/10/12 1810  . labetalol (NORMODYNE,TRANDATE) injection 10 mg  10 mg Intravenous Q2H PRN Purcell Nails, MD      . metoprolol (LOPRESSOR) injection 2.5-5 mg  2.5-5 mg Intravenous Q2H  PRN Rowe Clack, PA      . metoprolol tartrate (LOPRESSOR) tablet 12.5 mg  12.5 mg Oral BID Kerin Perna, MD   12.5 mg at 11/10/12 2221  . naphazoline-pheniramine (NAPHCON-A) 0.025-0.3 % ophthalmic solution 1 drop  1 drop Both Eyes QID PRN Rowe Clack, PA      . ondansetron [facility]) injection 4 mg  4 mg Intravenous Q6H PRN Rowe Clack, PA   4 mg at 11/10/12 1127  . pantoprazole (PROTONIX) EC tablet 40 mg  40 mg Oral Daily Rowe Clack, PA   40 mg at 11/10/12 0957  . sodium chloride 0.9 % injection 10-40 mL  10-40 mL Intracatheter Q12H Kerin Perna, MD   10 mL at 11/10/12 2200  . sodium chloride 0.9 % injection 10-40 mL  10-40 mL Intracatheter PRN Kerin Perna, MD      . sodium chloride 0.9 % injection 3 mL  3 mL Intravenous Q12H Wayne E Gold, PA   3 mL at 11/10/12 2200  . traMADol (ULTRAM) tablet 50 mg  50 mg Oral Q6H PRN Kerin Perna, MD   50 mg at 11/10/12 2350  .  Warfarin - Physician Dosing Inpatient   Does not apply q1800 Kerin Perna, MD        PE: General appearance: alert, cooperative, no distress and out of bed sitting in chair Lungs: decreased breath sound over RLL, otherwise clear Heart: regular rate and rhythm Extremities: 1+ bilateral LEE Pulses: 2+ and symmetric Skin: warm and dry Neurologic: Grossly normal  Lab Results:   Recent Labs  11/09/12 0248 11/10/12 0410 11/11/12 0430  WBC 15.7* 14.6* 15.4*  HGB 9.0* 7.9* 7.0*  HCT 26.5* 23.2* 20.3*  PLT 112* 142* 162   BMET  Recent Labs  11/09/12 0248 11/10/12 0410 11/11/12 0430  NA 139 139 141  K 3.4* 3.8 3.7  CL 97 99 100  CO2 36* 34* 36*  GLUCOSE 96 112* 135*  BUN 20 18 18   CREATININE 0.86 0.74 0.77  CALCIUM 9.0 8.9 9.0   PT/INR  Recent Labs  11/09/12 0248 11/10/12 0410 11/11/12 0430  LABPROT 17.4* 21.3* 33.8*  INR 1.47 1.93* 3.60*   Studies/Results: CXR 2/10 - pending  Assessment/Plan    Principal Problem:   Unstable angina Active Problems:   CAD-severe LM disease- S/P  CABG X 2 (SVG-OM, SVG-LAD) 11/05/12   S/P AVR, St Jude 2000   Dilated ascending aorta at cath 11/03/12 - S/P root replacement 11/05/12   Chronic anticoagulation   Contrast media allergy   HTN (hypertension)   Sarcoidosis   Plan: S/P CABG x 2 (SVG-OM, SVG-LAD), and S/P ascending aorta replacement. POD# 6. Maintaining NSR on telemetry w/ PVCs.  HR and BP stable. Per Dr. Landry Dyke note yesterday, the plan was to increase Metoprolol to 25 mg BID. Pt remains on 12.5 mg BID. Will consider increasing today. Pt's weight is up at 79.9 kg (admission weight was 78.1 kg). Net fluid loss is ~1.3 L. Continue to diurese w/ lasix, 40 mg IV, BID.  Cr. And K both WNL. H/H is down (7.0/20.3). Per CT surgery, pt will get transfused today. Will continue to monitor. MD to follow with further recommendation.     LOS: 7 days    [name] 11/11/2012 9:19 AM   Patient seen and examined. Agree with assessment and plan. Rhythm more stable; no ectopy presently. More energetic today. Currently getting PRBC. Continues to diurese.   Lennette Bihari, MD, [facility] 11/11/2012 10:35 AM

## 2012-11-11 NOTE — Progress Notes (Signed)
Patient ID: [name], female   DOB: [birth year], 62 y.o.   MRN: [id number]                   [address]            [place] [address]          [phone]     6 Days Post-Op Procedure(s) (LRB): CORONARY ARTERY BYPASS GRAFTING (CABG) (N/A) REPLACEMENT ASCENDING AORTA (N/A) REDO STERNOTOMY (N/A) INTRAOPERATIVE TRANSESOPHAGEAL ECHOCARDIOGRAM (N/A) ENDOVEIN HARVEST OF GREATER SAPHENOUS VEIN (Right)  Total Length of Stay:  LOS: 7 days  BP 129/60  Pulse 83  Temp(Src) 98.5 F (36.9 C) (Axillary)  Resp 20  Ht 5\' 7"  (1.702 m)  Wt 176 lb 2.4 oz (79.9 kg)  BMI 27.58 kg/m2  SpO2 100%  .Intake/Output     02/09 0701 - 02/10 0700 02/10 0701 - 02/11 0700   P.O. 200 240   I.V. (mL/kg) 480 (6) 250 (3.1)   Blood  362.5   IV Piggyback 16 4   Total Intake(mL/kg) 696 (8.7) 856.5 (10.7)   Urine (mL/kg/hr) 1980 (1) 900 (1)   Chest Tube     Total Output 1980 900   Net -1284 -43.5        Stool Occurrence 1 x      . sodium chloride 20 mL/hr at 11/11/12 1500     Lab Results  Component Value Date   WBC 15.4* 11/11/2012   HGB 7.0* 11/11/2012   HCT 20.3* 11/11/2012   PLT 162 11/11/2012   GLUCOSE 135* 11/11/2012   ALT 28 11/08/2012   AST 46* 11/08/2012   NA 141 11/11/2012   K 3.7 11/11/2012   CL 100 11/11/2012   CREATININE 0.77 11/11/2012   BUN 18 11/11/2012   CO2 36* 11/11/2012   INR 3.60* 11/11/2012   HGBA1C 6.1* 11/04/2012   Stable day Given blood today for low hgb  Delight Ovens MD  Beeper [phone] Office [phone] 11/11/2012 6:10 PM

## 2012-11-11 NOTE — Progress Notes (Signed)
Physical Therapy Treatment Patient Details Name: [name] MRN: [id number] DOB: 1950/12/16 Today's Date: 11/11/2012 Time: 4098-1191 PT Time Calculation (min): 26 min  PT Assessment / Plan / Recommendation Comments on Treatment Session  Pt s/p CABG redo and TAA.  Will benefit from PT to address balance and endurance.  Improving daily.  May need HHPT f/u.    Follow Up Recommendations  Home health PT;Supervision - Intermittent                 Equipment Recommendations  None recommended by PT        Frequency Min 3X/week   Plan Frequency remains appropriate;Discharge plan needs to be updated    Precautions / Restrictions Precautions Precautions: Fall;Sternal Restrictions Weight Bearing Restrictions: No   Pertinent Vitals/Pain Desat to 88% on RA with ambulation, replaced at 2LO2 to keep sat >90% with ambulation; Other VSS, No pain    Mobility  Bed Mobility Bed Mobility: Not assessed Transfers Transfers: Sit to Stand;Stand to Sit Sit to Stand: 4: Min guard;Without upper extremity assist;From chair/3-in-1 Stand to Sit: 4: Min guard;Without upper extremity assist;To chair/3-in-1 Details for Transfer Assistance: cues for sternal precautions Ambulation/Gait Ambulation/Gait Assistance: 4: Min guard Ambulation Distance (Feet): 225 Feet Assistive device: Other (Comment) (pushing wheelchair) Ambulation/Gait Assistance Details: Pt ambulating with better safety than before; monitored for dizziness as pt dizzy earlier prior to blood transfusion.   Gait Pattern: Step-through pattern;Decreased stride length Gait velocity: decreased Stairs: No Wheelchair Mobility Wheelchair Mobility: No    PT Goals Acute Rehab PT Goals PT Goal: Supine/Side to Sit - Progress: Progressing toward goal PT Goal: Sit to Stand - Progress: Progressing toward goal PT Goal: Ambulate - Progress: Progressing toward goal  Visit Information  Last PT Received On: 11/11/12 Assistance Needed: +1     Subjective Data  Subjective: "I want to try."   Cognition  Cognition Overall Cognitive Status: Appears within functional limits for tasks assessed/performed Arousal/Alertness: Awake/alert Orientation Level: Appears intact for tasks assessed Behavior During Session: Flat affect    Balance  Static Standing Balance Static Standing - Balance Support: During functional activity;Bilateral upper extremity supported Static Standing - Level of Assistance: 5: Stand by assistance Static Standing - Comment/# of Minutes: 3 minutes with wheelchair for balance  End of Session PT - End of Session Equipment Utilized During Treatment: Gait belt;Oxygen Activity Tolerance: Patient tolerated treatment well Patient left: in chair;with call bell/phone within reach Nurse Communication: Mobility status       [name] 11/11/2012, 4:23 PM [facility] Acute Rehabilitation [phone] (pager)

## 2012-11-11 NOTE — Progress Notes (Signed)
PT Cancellation Note  Patient Details Name: [name] MRN: [id number] DOB: 07/25/1951   Cancelled Treatment:    Reason Eval/Treat Not Completed: Other (comment) (Pt with Hgb of 7; getting ready to get blood and symptomatic).  Nursing asked PT to HOLD therapy. [facility] Acute Rehabilitation [phone] (pager)    INGOLD,Wojciech Willetts 11/11/2012, 9:50 AM

## 2012-11-12 ENCOUNTER — Inpatient Hospital Stay (HOSPITAL_COMMUNITY): Payer: PRIVATE HEALTH INSURANCE

## 2012-11-12 LAB — CBC
HCT: 21.4 % — ABNORMAL LOW (ref 36.0–46.0)
Hemoglobin: 7.2 g/dL — ABNORMAL LOW (ref 12.0–15.0)
MCH: 28.1 pg (ref 26.0–34.0)
MCHC: 33.6 g/dL (ref 30.0–36.0)
MCV: 83.6 fL (ref 78.0–100.0)
Platelets: 202 10*3/uL (ref 150–400)
RBC: 2.56 MIL/uL — ABNORMAL LOW (ref 3.87–5.11)
RDW: 15.9 % — ABNORMAL HIGH (ref 11.5–15.5)
WBC: 14.3 10*3/uL — ABNORMAL HIGH (ref 4.0–10.5)

## 2012-11-12 LAB — PREPARE RBC (CROSSMATCH)

## 2012-11-12 LAB — PROTIME-INR: Prothrombin Time: 37.4 seconds — ABNORMAL HIGH (ref 11.6–15.2)

## 2012-11-12 MED ORDER — POTASSIUM CHLORIDE 10 MEQ/50ML IV SOLN
10.0000 meq | INTRAVENOUS | Status: AC
  Administered 2012-11-12 (×2): 10 meq via INTRAVENOUS
  Filled 2012-11-12: qty 100

## 2012-11-12 MED ORDER — SODIUM CHLORIDE 0.9 % IV SOLN: 250.0000 mL | INTRAVENOUS | Status: DC | PRN

## 2012-11-12 MED ORDER — MOVING RIGHT ALONG BOOK
Freq: Once | Status: AC
  Administered 2012-11-12: 09:00:00
  Filled 2012-11-12: qty 1

## 2012-11-12 MED ORDER — SODIUM CHLORIDE 0.9 % IJ SOLN
3.0000 mL | Freq: Two times a day (BID) | INTRAMUSCULAR | Status: DC
  Administered 2012-11-15 – 2012-11-18 (×2): 3 mL via INTRAVENOUS

## 2012-11-12 MED ORDER — POTASSIUM CHLORIDE CRYS ER 20 MEQ PO TBCR
20.0000 meq | EXTENDED_RELEASE_TABLET | Freq: Every day | ORAL | Status: DC
  Administered 2012-11-12 – 2012-11-13 (×2): 20 meq via ORAL
  Filled 2012-11-12 (×3): qty 1

## 2012-11-12 MED ORDER — SODIUM CHLORIDE 0.9 % IJ SOLN: 3.0000 mL | INTRAMUSCULAR | Status: DC | PRN

## 2012-11-12 MED ORDER — ACETAMINOPHEN 325 MG PO TABS
650.0000 mg | ORAL_TABLET | Freq: Four times a day (QID) | ORAL | Status: DC | PRN
  Administered 2012-11-18 – 2012-11-26 (×3): 650 mg via ORAL
  Filled 2012-11-12 (×3): qty 2

## 2012-11-12 MED ORDER — MAGNESIUM HYDROXIDE 400 MG/5ML PO SUSP: 30.0000 mL | Freq: Every day | ORAL | Status: DC | PRN

## 2012-11-12 MED ORDER — OXYCODONE HCL 5 MG PO TABS
5.0000 mg | ORAL_TABLET | ORAL | Status: DC | PRN
  Administered 2012-11-12 – 2012-11-13 (×3): 5 mg via ORAL
  Administered 2012-11-13: 10 mg via ORAL
  Administered 2012-11-13: 5 mg via ORAL
  Administered 2012-11-14 – 2012-11-22 (×9): 10 mg via ORAL
  Administered 2012-11-22 (×3): 5 mg via ORAL
  Administered 2012-11-23 (×2): 10 mg via ORAL
  Filled 2012-11-12 (×2): qty 1
  Filled 2012-11-12 (×2): qty 2
  Filled 2012-11-12: qty 1
  Filled 2012-11-12 (×4): qty 2
  Filled 2012-11-12 (×2): qty 1
  Filled 2012-11-12 (×6): qty 2
  Filled 2012-11-12: qty 1
  Filled 2012-11-12: qty 2

## 2012-11-12 MED ORDER — GUAIFENESIN ER 600 MG PO TB12
600.0000 mg | ORAL_TABLET | Freq: Two times a day (BID) | ORAL | Status: DC | PRN
  Filled 2012-11-12: qty 1

## 2012-11-12 MED ORDER — FE FUMARATE-B12-VIT C-FA-IFC PO CAPS
1.0000 | ORAL_CAPSULE | Freq: Three times a day (TID) | ORAL | Status: DC
  Administered 2012-11-12 – 2012-11-26 (×39): 1 via ORAL
  Filled 2012-11-12 (×47): qty 1

## 2012-11-12 MED ORDER — FUROSEMIDE 40 MG PO TABS
40.0000 mg | ORAL_TABLET | Freq: Every day | ORAL | Status: DC
  Administered 2012-11-12: 40 mg via ORAL
  Filled 2012-11-12 (×2): qty 1

## 2012-11-12 MED ORDER — BISACODYL 10 MG RE SUPP: 10.0000 mg | Freq: Every day | RECTAL | Status: DC | PRN

## 2012-11-12 MED ORDER — ASPIRIN EC 81 MG PO TBEC
81.0000 mg | DELAYED_RELEASE_TABLET | Freq: Every day | ORAL | Status: DC
  Administered 2012-11-12 – 2012-11-26 (×15): 81 mg via ORAL
  Filled 2012-11-12 (×15): qty 1

## 2012-11-12 MED ORDER — BISACODYL 5 MG PO TBEC
10.0000 mg | DELAYED_RELEASE_TABLET | Freq: Every day | ORAL | Status: DC | PRN
  Administered 2012-11-16: 10 mg via ORAL
  Filled 2012-11-12 (×2): qty 2

## 2012-11-12 NOTE — Progress Notes (Signed)
7 Days Post-Op Procedure(s) (LRB): CORONARY ARTERY BYPASS GRAFTING (CABG) (N/A) REPLACEMENT ASCENDING AORTA (N/A) REDO STERNOTOMY (N/A) INTRAOPERATIVE TRANSESOPHAGEAL ECHOCARDIOGRAM (N/A) ENDOVEIN HARVEST OF GREATER SAPHENOUS VEIN (Right) Subjective: Redo CABG, replace ascending aorta perop blood loss anemia Hb 7.2 Postop echo w/ good LV fx NSR on amio Daily coumadin for prior St Jude AVR conduit  Objective: Vital signs in last 24 hours: Temp:  [97.9 F (36.6 C)-98.5 F (36.9 C)] 97.9 F (36.6 C) (02/11 0813) Pulse Rate:  [75-95] 82 (02/11 0700) Cardiac Rhythm:  [-] Normal sinus rhythm (02/10 2000) Resp:  [13-25] 19 (02/11 0700) BP: (122-151)/(42-71) 141/56 mmHg (02/11 0700) SpO2:  [88 %-100 %] 100 % (02/11 0700) Weight:  [175 lb 11.3 oz (79.7 kg)] 175 lb 11.3 oz (79.7 kg) (02/11 0700)  Hemodynamic parameters for last 24 hours:  stable  Intake/Output from previous day: 02/10 0701 - 02/11 0700 In: 1436.5 [P.O.:540; I.V.:530; Blood:362.5; IV Piggyback:4] Out: 1500 [Urine:1500] Intake/Output this shift:    Lungs clear No murmur  Lab Results:  Recent Labs  11/11/12 0430 11/12/12 0430  WBC 15.4* 14.3*  HGB 7.0* 7.2*  HCT 20.3* 21.4*  PLT 162 202   BMET:  Recent Labs  11/10/12 0410 11/11/12 0430  NA 139 141  K 3.8 3.7  CL 99 100  CO2 34* 36*  GLUCOSE 112* 135*  BUN 18 18  CREATININE 0.74 0.77  CALCIUM 8.9 9.0    PT/INR:  Recent Labs  11/12/12 0430  LABPROT 37.4*  INR 4.12*   ABG    Component Value Date/Time   PHART 7.385 11/06/2012 1111   HCO3 26.0* 11/06/2012 1111   TCO2 26 11/06/2012 1619   ACIDBASEDEF 1.0 11/05/2012 1921   O2SAT 69.2 11/07/2012 1019   CBG (last 3)   Recent Labs  11/09/12 2135 11/10/12 0718 11/10/12 1124  GLUCAP 112* 112* 130*    Assessment/Plan: S/P Procedure(s) (LRB): CORONARY ARTERY BYPASS GRAFTING (CABG) (N/A) REPLACEMENT ASCENDING AORTA (N/A) REDO STERNOTOMY (N/A) INTRAOPERATIVE TRANSESOPHAGEAL ECHOCARDIOGRAM  (N/A) ENDOVEIN HARVEST OF GREATER SAPHENOUS VEIN (Right)   Hold coumadin today transfuse 1 unit PRBC's Transfer to stepdown follow WBC   LOS: 8 days    VAN TRIGT III,PETER 11/12/2012

## 2012-11-12 NOTE — Progress Notes (Signed)
Physical Therapy Treatment Patient Details Name: [name] MRN: [id number] DOB: 12-Oct-1950 Today's Date: 11/12/2012 Time: 4098-1191 PT Time Calculation (min): 28 min  PT Assessment / Plan / Recommendation Comments on Treatment Session  Pt s/p redo CABG and TAA repair. Pt progressing with mobility but benefits from encouragement. Pt educated for all sternal precautions but only able to state 2 end of session. pt able to perform pericare without assist with cueing for maintaining precautions. Deferred HEP and bed mobility due to room transfer    Follow Up Recommendations  SNF;Home health PT;Supervision for mobility/OOB (if additional assist arranged HHPT, if pt continues to have )     Does the patient have the potential to tolerate intense rehabilitation     Barriers to Discharge        Equipment Recommendations       Recommendations for Other Services    Frequency     Plan Discharge plan needs to be updated    Precautions / Restrictions Precautions Precautions: Fall;Sternal   Pertinent Vitals/Pain 7/10 right sided chest pain, RN aware, pt stating it pulled taking a bath and hasn't been able to get rid of it, no change with activity sats 95-97% on RA with ambulation HR 93-101    Mobility  Bed Mobility Bed Mobility: Not assessed Details for Bed Mobility Assistance: Pt in chair before and after tx Transfers Sit to Stand: 5: Supervision;From chair/3-in-1 Stand to Sit: 5: Supervision;To chair/3-in-1 Details for Transfer Assistance: cueing for hands on thighs with transfers x 3 trials Ambulation/Gait Ambulation/Gait Assistance: 5: Supervision Ambulation Distance (Feet): 225 Feet Assistive device: Rolling walker Ambulation/Gait Assistance Details: cueing for posture and position in RW Gait Pattern: Step-through pattern;Decreased stride length Gait velocity: decreased Stairs: No    Exercises     PT Diagnosis:    PT Problem List:   PT Treatment Interventions:      PT Goals Acute Rehab PT Goals PT Goal: Sit to Stand - Progress: Progressing toward goal PT Goal: Ambulate - Progress: Progressing toward goal Additional Goals Additional Goal #1: Pt will independently state and demonstrate adherence to all sternal precaution with mobility PT Goal: Additional Goal #1 - Progress: Goal set today  Visit Information  Last PT Received On: 11/12/12 Assistance Needed: +1    Subjective Data  Subjective: I have a cramp in my side after they gave me a bath   Cognition  Cognition Overall Cognitive Status: Appears within functional limits for tasks assessed/performed Arousal/Alertness: Awake/alert Orientation Level: Appears intact for tasks assessed Behavior During Session: Flat affect    Balance     End of Session PT - End of Session Equipment Utilized During Treatment: Gait belt Activity Tolerance: Patient tolerated treatment well Patient left:  (in [facility] for transfer to 2000 with RN) Nurse Communication: Mobility status   GP     Toney Sang Beth 11/12/2012, 3:35 PM Delaney Meigs, PT [phone]

## 2012-11-12 NOTE — Care Management Note (Signed)
    Page 1 of 2   11/25/2012     4:14:28 PM   CARE MANAGEMENT NOTE 11/25/2012  Patient:  [name], [name]   Account Number:  [id number]  Date Initiated:  11/08/2012  Documentation initiated by:  Alvira Philips Assessment:   62 yr-old female adm with dx of CAD, s/p CABG    PCP: Dr Sandra Cockayne     Action/Plan:   PTA, PT RESIDES AT HOME ALONE.  MET WITH PT TO DISCUSS DC PLANS.   Anticipated DC Date:  11/26/2012   Anticipated DC Plan:  HOME W HOME HEALTH SERVICES      DC Planning Services  CM consult      [facility] Choice  HOME HEALTH   Choice offered to / List presented to:  C-1 Patient   DME arranged  [facility]ALK[facility] - ROLLING  BEDSIDE COMMODE      DME agency  [facility]     [facility] arranged  HH-1 RN      [facility] agency  [facility]   Status of service:  Completed, signed off Medicare Important Message given?   (If response is "NO", the following Medicare IM given date fields will be blank) Date Medicare IM given:   Date Additional Medicare IM given:    Discharge Disposition:  HOME W HOME HEALTH SERVICES  Per UR Regulation:  Reviewed for med. necessity/level of care/duration of stay  If discussed at Long Length of Stay Meetings, dates discussed:   11/12/2012    Comments:  11/25/12 Shakala Marlatt,RN,BSN [phone] PT S/P PERICARDIAL WINDOW ON 2/20.  LIKELY DC ON 2/25; PT WILL NEED HHRN FOLLOW UP FOR RESTORATIVE CARE.  REFERRAL TO [facility], PER PT CHOICE.  PT REQUESTS WALKER AND 3 IN 1 FOR HOME--REFERRAL TO [facility] FOR DME NEEDS.  START OF CARE FOR HH IS 24-48H POST DC DATE.  SON TO PROVIDE 24H CARE AT DISCHARGE.  11-22-12 10am Avie Arenas, RNBSN [phone] post op pericardiacl window on 2-20.  11/15/12 Leily Capek,RN,BSN [phone] PT STATES SHE DOES NOT HAVE RELIABLE HELP AT HOME AT DC. HER MOTHER WAS GOING TO STAY WITH HER, BUT SHE HAS BEEN IN THE HOSPITAL AS WELL, AND  IS UNABLE.  BOTH OF HER SONS WORK, AND SHE STATES SHE DOES NOT HAVE  24HR CARE ARRANGED. SHE IS STILL ATTEMPTING TO WORK OUT SOMETHING, BUT STATES SHE MAY NOT BE ABLE TO.  SHE IS AGREEABLE TO ST-SNF FOR REHAB AT DISCHARGE, IF NEEDED, AND P.T. CONSULT SUPPORTS THIS.  WILL CONSULT CSW TO FACILITATE DC TO POSSIBLE SNF, SHOULD PT NOT BE ABLE TO WORK OUT 24HR CARE WITH FRIENDS/FAMILY. WILL FOLLOW.  11/12/12 1437 Henrietta Mayo RN BSN MSN CCM Pt lives alone but can rely on her mother and a friend to assist as needed.  Advised that PT recommends home therapy, provided list of agencies.  Pt states that her mother and friend ambulated with her after previous surgery and she anticipates they will do the same this time.  States she would prefer to wait until closer to discharge to decide whether she will require skilled services.

## 2012-11-12 NOTE — Progress Notes (Signed)
7 Days Post-Op  Procedure(s) (LRB):  CORONARY ARTERY BYPASS GRAFTING (CABG) (N/A)  REPLACEMENT ASCENDING AORTA (N/A)  REDO STERNOTOMY (N/A)  Subjective: Post-op echo with preserved EF, high normal AoV pressures (partly due to high flow state with anemia) No complaints except weakness.  Objective: Vital signs in last 24 hours: Temp:  [97.9 F (36.6 C)-98.5 F (36.9 C)] 97.9 F (36.6 C) (02/11 0813) Pulse Rate:  [75-95] 83 (02/11 0815) Resp:  [13-25] 19 (02/11 0815) BP: (122-151)/(42-71) 129/63 mmHg (02/11 0800) SpO2:  [88 %-100 %] 100 % (02/11 0700) Weight:  [79.7 kg (175 lb 11.3 oz)] 79.7 kg (175 lb 11.3 oz) (02/11 0700) Weight change: -0.2 kg (-7.1 oz) Last BM Date: 11/11/11 Intake/Output from previous day: -153 02/10 0701 - 02/11 0700 In: 1436.5 [P.O.:540; I.V.:530; Blood:362.5; IV Piggyback:4] Out: 1500 [Urine:1500] Intake/Output this shift:    PE: General:up in chair, alert and oriented, feeling weak but no other compalints Heart:S1S2 RRR crisp closure of valve Lungs:clear ant. Abd:+ BS soft, non tender Ext:no edema    Lab Results:  Recent Labs  11/11/12 0430 11/12/12 0430  WBC 15.4* 14.3*  HGB 7.0* 7.2*  HCT 20.3* 21.4*  PLT 162 202   BMET  Recent Labs  11/10/12 0410 11/11/12 0430  NA 139 141  K 3.8 3.7  CL 99 100  CO2 34* 36*  GLUCOSE 112* 135*  BUN 18 18  CREATININE 0.74 0.77  CALCIUM 8.9 9.0   Lab Results  Component Value Date   HGBA1C 6.1* 11/04/2012     No results found for this basename: TSH  Studies/Results: Dg Chest 2 View  11/12/2012  *RADIOLOGY REPORT*  Clinical Data: Sort chest and post CABG.  CHEST - 2 VIEW  Comparison: 11/11/2012  Findings: Two views of the chest were obtained.  Central line tip near the SVC/right atrium junction.  Bibasilar densities suggest atelectasis and pleural effusions.  There may be focal volume loss in the right hilum which has minimally changed.  Heart size is grossly stable.  No definite pneumothorax.   Epicardial pacer wires are present.  IMPRESSION: Persistent bibasilar opacities are suggestive for pleural fluid and atelectasis.   Original Report Authenticated By: Richarda Overlie, M.D.    Dg Chest Port 1 View  11/11/2012  *RADIOLOGY REPORT*  Clinical Data: Pleural effusion, atelectasis, post CABG  PORTABLE CHEST - 1 VIEW  Comparison: 11/10/2012; 11/04/2012; chest CT - 11/15/2012  Findings: Grossly unchanged enlarged cardiac silhouette and mediastinal contours post median sternotomy and CABG.  Stable positioning of support apparatus. Suspected minimal decrease in size of bilateral pleural effusions with improved aeration of the bilateral lungs.  No pneumothorax.  Unchanged bones.  IMPRESSION: Findings suggestive of improved pulmonary edema with interval decrease in small residual effusions bibasilar opacities, atelectasis versus infiltrate.   Original Report Authenticated By: Tacey Ruiz, MD     Medications: I have reviewed the patient's current medications. Marland Kitchen amiodarone  200 mg Oral BID  . aspirin EC  81 mg Oral Daily  . atorvastatin  40 mg Oral q1800  . bisacodyl  10 mg Oral Daily   Or  . bisacodyl  10 mg Rectal Daily  . docusate sodium  200 mg Oral Daily  . ferrous fumarate-b12-vitamic C-folic acid  1 capsule Oral TID PC  . metoprolol tartrate  12.5 mg Oral BID  . pantoprazole  40 mg Oral Daily  . potassium chloride  10 mEq Intravenous Q1 Hr x 2  . sodium chloride  10-40 mL Intracatheter Q12H  .  sodium chloride  3 mL Intravenous Q12H  . Warfarin - Physician Dosing Inpatient   Does not apply q1800   Assessment/Plan: Principal Problem:   Unstable angina Active Problems:   CAD-severe LM disease- S/P CABG X 2 (SVG-OM, SVG-LAD) 11/05/12   S/P AVR, St Jude 2000   Dilated ascending aorta at cath 11/03/12 - S/P root replacement 11/05/12   Chronic anticoagulation   Contrast media allergy   HTN (hypertension)   Sarcoidosis  PLAN: post op anemia continues.  Transfused yesterday and being transfused  now.   Echo results now available.    LOS: 8 days   INGOLD,LAURA R 11/12/2012, 8:23 AM  I have seen and evaluated the patient this AM along with Nada Boozer, NP. I agree with her findings, examination as well as impression recommendations.  Remains in SR - on Amiodarone & BB   BP stable  INR climbed quite quickly - warfarin held today.  With persistent anemia - PRBC again today (no "post-transfusion bump" from PRBC yesterday.  Seems stable from cardiac standpoint, with post-op echo encouraging. Gentle diuresis per CVTS - no CHF Sx.  Marykay Lex, M.D., M.S. [facility] [address] [place], [place]  [address]  [phone] Pager # [phone] 11/12/2012 9:26 AM

## 2012-11-13 ENCOUNTER — Inpatient Hospital Stay (HOSPITAL_COMMUNITY): Payer: PRIVATE HEALTH INSURANCE

## 2012-11-13 LAB — TYPE AND SCREEN
ABO/RH(D): O POS
Antibody Screen: NEGATIVE
Unit division: 0
Unit division: 0

## 2012-11-13 LAB — BASIC METABOLIC PANEL
BUN: 14 mg/dL (ref 6–23)
CO2: 32 mEq/L (ref 19–32)
Calcium: 9.3 mg/dL (ref 8.4–10.5)
Chloride: 101 mEq/L (ref 96–112)
Creatinine, Ser: 0.73 mg/dL (ref 0.50–1.10)
GFR calc Af Amer: >90 mL/min (ref >90)
GFR calc non Af Amer: >90 mL/min (ref >90)
Glucose, Bld: 121 mg/dL — ABNORMAL HIGH (ref 70–99)
Potassium: 3.9 mEq/L (ref 3.5–5.1)
Sodium: 142 mEq/L (ref 135–145)

## 2012-11-13 LAB — URINALYSIS, ROUTINE W REFLEX MICROSCOPIC
Bilirubin Urine: NEGATIVE
Ketones, ur: NEGATIVE mg/dL
Nitrite: POSITIVE — AB
Protein, ur: NEGATIVE mg/dL
Specific Gravity, Urine: 1.014 (ref 1.005–1.030)
Urobilinogen, UA: 0.2 mg/dL (ref 0.0–1.0)

## 2012-11-13 LAB — CBC
HCT: 25.3 % — ABNORMAL LOW (ref 36.0–46.0)
Hemoglobin: 8.5 g/dL — ABNORMAL LOW (ref 12.0–15.0)
MCH: 28.5 pg (ref 26.0–34.0)
MCHC: 33.6 g/dL (ref 30.0–36.0)
MCV: 84.9 fL (ref 78.0–100.0)
Platelets: 276 10*3/uL (ref 150–400)
RBC: 2.98 MIL/uL — ABNORMAL LOW (ref 3.87–5.11)
RDW: 16.2 % — ABNORMAL HIGH (ref 11.5–15.5)
WBC: 16.5 10*3/uL — ABNORMAL HIGH (ref 4.0–10.5)

## 2012-11-13 LAB — PROTIME-INR: INR: 3.28 — ABNORMAL HIGH (ref 0.00–1.49)

## 2012-11-13 LAB — PRO B NATRIURETIC PEPTIDE: Pro B Natriuretic peptide (BNP): 1912 pg/mL — ABNORMAL HIGH (ref 0–125)

## 2012-11-13 MED ORDER — CEFUROXIME AXETIL 500 MG PO TABS
500.0000 mg | ORAL_TABLET | Freq: Two times a day (BID) | ORAL | Status: DC
  Administered 2012-11-13 – 2012-11-14 (×2): 500 mg via ORAL
  Filled 2012-11-13 (×4): qty 1

## 2012-11-13 MED ORDER — CEFDINIR 125 MG/5ML PO SUSR: 250.0000 mg | Freq: Two times a day (BID) | ORAL | Status: DC

## 2012-11-13 MED ORDER — FUROSEMIDE 10 MG/ML IJ SOLN
40.0000 mg | Freq: Two times a day (BID) | INTRAMUSCULAR | Status: AC
  Administered 2012-11-13 – 2012-11-14 (×4): 40 mg via INTRAVENOUS
  Filled 2012-11-13 (×4): qty 4

## 2012-11-13 NOTE — Progress Notes (Signed)
Pt. Seen and examined. Agree with the NP/PA-C note as written.  Nauseated. Still having a hard time taking a deep breath. Has diuresed well, but may have residual effusions. Agree with possible ultrasound guided thoracentesis. HCT has been stable, may be a combination of short-lived transfusion blood + high output across her mechanical valve causing premature RBC destruction. Echo does not show significant pericardial effusion.   Chrystie Nose, MD, [facility] Attending Cardiologist [facility]

## 2012-11-13 NOTE — Progress Notes (Addendum)
[address]            [place] [address]          [phone]                     [address]            [place] [address]          [phone]    8 Days Post-Op  Procedure(s) (LRB): CORONARY ARTERY BYPASS GRAFTING (CABG) (N/A) REPLACEMENT ASCENDING AORTA (N/A) REDO STERNOTOMY (N/A) INTRAOPERATIVE TRANSESOPHAGEAL ECHOCARDIOGRAM (N/A) ENDOVEIN HARVEST OF GREATER SAPHENOUS VEIN (Right) Subjective: Feels SOB, + orthopnea, says she's been wheezing  Objective  Telemetry sinus rhythm  Temp:  [97.4 F (36.3 C)-98.6 F (37 C)] 98.3 F (36.8 C) (02/12 0444) Pulse Rate:  [73-95] 95 (02/12 0444) Resp:  [14-24] 18 (02/12 0444) BP: (113-144)/(51-70) 123/57 mmHg (02/12 0444) SpO2:  [95 %-100 %] 98 % (02/12 0444) Weight:  [175 lb 11.2 oz (79.697 kg)] 175 lb 11.2 oz (79.697 kg) (02/12 0444)   Intake/Output Summary (Last 24 hours) at 11/13/12 0747 Last data filed at 11/13/12 0500  Gross per 24 hour  Intake    725 ml  Output   1325 ml  Net   -600 ml       General appearance: alert, cooperative, fatigued and no distress Heart: regular rate and rhythm and soft systolic murmur, crisp valve click Lungs: dim in bases L>R Abdomen: soft, nontender Extremities: + LE edema Wound: incisions healing well  Lab Results:  Recent Labs  11/11/12 0430 11/13/12 0530  NA 141 142  K 3.7 3.9  CL 100 101  CO2 36* 32  GLUCOSE 135* 121*  BUN 18 14  CREATININE 0.77 0.73  CALCIUM 9.0 9.3   No results found for this basename: AST, ALT, ALKPHOS, BILITOT, PROT, ALBUMIN,  in the last 72 hours No results found for this basename: LIPASE, AMYLASE,  in the last 72 hours  Recent Labs  11/12/12 0430 11/13/12 0530  WBC 14.3* 16.5*  HGB 7.2* 8.5*  HCT 21.4* 25.3*  MCV 83.6 84.9  PLT 202 276   No results found for this basename: CKTOTAL, CKMB, TROPONINI,  in the last 72 hours No components found with this basename: POCBNP,  No results  found for this basename: DDIMER,  in the last 72 hours No results found for this basename: HGBA1C,  in the last 72 hours No results found for this basename: CHOL, HDL, LDLCALC, TRIG, CHOLHDL,  in the last 72 hours No results found for this basename: TSH, T4TOTAL, FREET3, T3FREE, THYROIDAB,  in the last 72 hours No results found for this basename: VITAMINB12, FOLATE, FERRITIN, TIBC, IRON, RETICCTPCT,  in the last 72 hours  Medications: Scheduled . amiodarone  200 mg Oral BID  . aspirin EC  81 mg Oral Daily  . atorvastatin  40 mg Oral q1800  . bisacodyl  10 mg Oral Daily   Or  . bisacodyl  10 mg Rectal Daily  . docusate sodium  200 mg Oral Daily  . ferrous fumarate-b12-vitamic C-folic acid  1 capsule Oral TID PC  . furosemide  40 mg Oral Daily  . metoprolol tartrate  12.5 mg Oral BID  . pantoprazole  40 mg Oral Daily  . potassium chloride  20 mEq Oral Daily  . sodium chloride  10-40 mL  Intracatheter Q12H  . sodium chloride  3 mL Intravenous Q12H  . sodium chloride  3 mL Intravenous Q12H  . Warfarin - Physician Dosing Inpatient   Does not apply q1800     Radiology/Studies:  Dg Chest 2 View  11/12/2012  *RADIOLOGY REPORT*  Clinical Data: Sort chest and post CABG.  CHEST - 2 VIEW  Comparison: 11/11/2012  Findings: Two views of the chest were obtained.  Central line tip near the SVC/right atrium junction.  Bibasilar densities suggest atelectasis and pleural effusions.  There may be focal volume loss in the right hilum which has minimally changed.  Heart size is grossly stable.  No definite pneumothorax.  Epicardial pacer wires are present.  IMPRESSION: Persistent bibasilar opacities are suggestive for pleural fluid and atelectasis.   Original Report Authenticated By: Richarda Overlie, M.D.     INR:3.28 Will add last result for INR, ABG once components are confirmed Will add last 4 CBG results once components are confirmed  Assessment/Plan: S/P Procedure(s) (LRB): CORONARY ARTERY BYPASS  GRAFTING (CABG) (N/A) REPLACEMENT ASCENDING AORTA (N/A) REDO STERNOTOMY (N/A) INTRAOPERATIVE TRANSESOPHAGEAL ECHOCARDIOGRAM (N/A) ENDOVEIN HARVEST OF GREATER SAPHENOUS VEIN (Right)  1. Doing well overall 2 will hold  coumadin today 3 H/H improved after transfusion 4 small bilat effusions- cont lasix, may have to consider increase dose, check pBNP 5 rhythm stable , on amio/low dose beta blocker 6 increased leukocytosis- monitor, afebrile, incisions look good. Check UA 7 push pulm toilet/rehab   LOS: 9 days    GOLD,WAYNE E 2/12/20147:47 AM  CXR shows R effusion and/or atelectasis- pt c/o SOB but sats are > 94-95%   chg lasix ro IV for 4 doses  Will ask That  IR eval w/US and tap R effusion if significant- hold coumadin for thoracentesis when INR OK for IR

## 2012-11-13 NOTE — Progress Notes (Signed)
CARDIAC REHAB PHASE I   PRE:  Rate/Rhythm: 89   BP:  Supine:   Sitting: 137/595  Standing:    SaO2: 94 2L  MODE:  Ambulation: 300 ft   POST:  Rate/Rhythem: 114 ST  BP:  Supine:   Sitting: 143/67  Standing:    SaO2: 88-89 2L after walk with rest 93 2L 0920-1000  Assisted X 1 used walker and O2 2L to ambulate. Gait steady with walker, slow pace. Pt tires easily and is DOE. Pt able to walk 300 feet with encouragement. Assisted to [facility] after walk, then to recliner. O2 sat after walk 88-89% on 2L with rest improved to 93%. Encouraged use of IS. Call light in reach.  Beatrix Fetters

## 2012-11-13 NOTE — Progress Notes (Signed)
[facility]  Subjective: Tired after ambulating with cardiac rehab.  DOE/SOB  Objective: Vital signs in last 24 hours: Temp:  [98.1 F (36.7 C)-98.6 F (37 C)] 98.3 F (36.8 C) (02/12 0444) Pulse Rate:  [78-95] 95 (02/12 0444) Resp:  [17-22] 18 (02/12 0444) BP: (113-144)/(51-68) 123/57 mmHg (02/12 0444) SpO2:  [95 %-100 %] 98 % (02/12 0444) Weight:  [79.697 kg (175 lb 11.2 oz)] 79.697 kg (175 lb 11.2 oz) (02/12 0444) Last BM Date: 11/12/12  Intake/Output from previous day: 02/11 0701 - 02/12 0700 In: 725 [P.O.:120; I.V.:155; Blood:350; IV Piggyback:100] Out: 1325 [Urine:1325] Intake/Output this shift: Total I/O In: -  Out: 200 [Urine:200]  Medications Current Facility-Administered Medications  Medication Dose Route Frequency Provider Last Rate Last Dose  . 0.9 %  sodium chloride infusion  250 mL Intravenous PRN Kerin Perna, MD      . acetaminophen (TYLENOL) tablet 650 mg  650 mg Oral Q6H PRN Kerin Perna, MD      . amiodarone (PACERONE) tablet 200 mg  200 mg Oral BID Kerin Perna, MD   200 mg at 11/12/12 2154  . aspirin EC tablet 81 mg  81 mg Oral Daily Kerin Perna, MD   81 mg at 11/12/12 4098  . atorvastatin (LIPITOR) tablet 40 mg  40 mg Oral q1800 Rowe Clack, PA   40 mg at 11/12/12 1747  . bisacodyl (DULCOLAX) EC tablet 10 mg  10 mg Oral Daily Rowe Clack, [place]   10 mg at 11/08/12 1208   Or  . bisacodyl (DULCOLAX) suppository 10 mg  10 mg Rectal Daily Rowe Clack, PA      . bisacodyl (DULCOLAX) EC tablet 10 mg  10 mg Oral Daily PRN Kerin Perna, MD       Or  . bisacodyl (DULCOLAX) suppository 10 mg  10 mg Rectal Daily PRN Kerin Perna, MD      . docusate sodium (COLACE) capsule 200 mg  200 mg Oral Daily Rowe Clack, PA   200 mg at 11/08/12 1208  . ferrous fumarate-b12-vitamic C-folic acid (TRINSICON / FOLTRIN) capsule 1 capsule  1 capsule Oral TID PC Kerin Perna, MD   1 capsule at 11/12/12 1747  . furosemide  (LASIX) injection 40 mg  40 mg Intravenous BID Kerin Perna, MD      . guaiFENesin [facility]) 12 hr tablet 600 mg  600 mg Oral Q12H PRN Kerin Perna, MD      . labetalol (NORMODYNE,TRANDATE) injection 10 mg  10 mg Intravenous Q2H PRN Purcell Nails, MD      . magnesium hydroxide (MILK OF MAGNESIA) suspension 30 mL  30 mL Oral Daily PRN Kerin Perna, MD      . metoprolol (LOPRESSOR) injection 2.5-5 mg  2.5-5 mg Intravenous Q2H PRN Rowe Clack, PA      . metoprolol tartrate (LOPRESSOR) tablet 12.5 mg  12.5 mg Oral BID Kerin Perna, MD   12.5 mg at 11/12/12 2154  . naphazoline-pheniramine (NAPHCON-A) 0.025-0.3 % ophthalmic solution 1 drop  1 drop Both Eyes QID PRN Rowe Clack, PA      . ondansetron [facility]) injection 4 mg  4 mg Intravenous Q6H PRN Rowe Clack, PA   4 mg at 11/10/12 1127  . oxyCODONE (Oxy IR/ROXICODONE) immediate release tablet 5-10 mg  5-10 mg Oral Q3H PRN Kerin Perna, MD   10 mg at 11/13/12 0541  .  pantoprazole (PROTONIX) EC tablet 40 mg  40 mg Oral Daily Rowe Clack, PA   40 mg at 11/12/12 0914  . potassium chloride SA (K-DUR,KLOR-CON) CR tablet 20 mEq  20 mEq Oral Daily Kerin Perna, MD   20 mEq at 11/12/12 0927  . sodium chloride 0.9 % injection 10-40 mL  10-40 mL Intracatheter Q12H Kerin Perna, MD   10 mL at 11/12/12 0915  . sodium chloride 0.9 % injection 3 mL  3 mL Intravenous Q12H Rowe Clack, PA   3 mL at 11/11/12 1610  . sodium chloride 0.9 % injection 3 mL  3 mL Intravenous Q12H Kerin Perna, MD      . sodium chloride 0.9 % injection 3 mL  3 mL Intravenous PRN Kerin Perna, MD      . traMADol [name]) tablet 50 mg  50 mg Oral Q6H PRN Kerin Perna, MD   50 mg at 11/12/12 1524  . Warfarin - Physician Dosing Inpatient   Does not apply q1800 Kerin Perna, MD        PE: General appearance: alert, cooperative and no distress Lungs: Decreased BS bibasilar and right mid.  No rales or wheeze Heart: regular rate and rhythm and 1/6 sys  MM Extremities: 2+ LEE Pulses: 2+ and symmetric Skin: Warm and dry Neurologic: Grossly normal  Lab Results:   Recent Labs  11/11/12 0430 11/12/12 0430 11/13/12 0530  WBC 15.4* 14.3* 16.5*  HGB 7.0* 7.2* 8.5*  HCT 20.3* 21.4* 25.3*  PLT 162 202 276   BMET  Recent Labs  11/11/12 0430 11/13/12 0530  NA 141 142  K 3.7 3.9  CL 100 101  CO2 36* 32  GLUCOSE 135* 121*  BUN 18 14  CREATININE 0.77 0.73  CALCIUM 9.0 9.3   PT/INR  Recent Labs  11/11/12 0430 11/12/12 0430 11/13/12 0530  LABPROT 33.8* 37.4* 31.6*  INR 3.60* 4.12* 3.28*    Studies/Results: CHEST - 2 VIEW  Comparison: 11/12/2012; 11/11/2012; 11/04/2012  Findings:  Grossly unchanged cardiac silhouette and mediastinal contours post  median sternotomy and CABG. Stable positioning of support  apparatus. No significant change and bilateral pleural effusions,  right greater than left. Grossly unchanged bibasilar heterogeneous  opacities. No new focal airspace opacity. No definite evidence of  pulmonary edema or pneumothorax. Surgical clips overlie the right  axilla. Unchanged bones.  IMPRESSION:  1. Grossly unchanged small bilateral pleural effusions, right  greater than left.  2. Grossly unchanged atelectasis/collapse of the right middle lobe  and bibasilar opacities, atelectasis versus infiltrate.  Assessment/Plan   Principal Problem:   Unstable angina Active Problems:   CAD-severe LM disease- S/P CABG X 2 (SVG-OM, SVG-LAD) 11/05/12   S/P AVR, St Jude 2000   Dilated ascending aorta at cath 11/03/12 - S/P root replacement 11/05/12   Chronic anticoagulation   Contrast media allergy   HTN (hypertension)   Sarcoidosis  Plan:  POD #8 CABG x2 SVG to LAD and SVG to Circ.  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  Maintaining NSR.  PO amiodarone.  BP and HR stable.   HGB improved to 8.5 after transfusion.    CXR grossly unchanged small bilateral pleural effusions, right greater  than left.  Grossly unchanged atelectasis/collapse of the right middle lobe  and bibasilar opacities, atelectasis versus infiltrate.  Net fluids:  -0.5L in 24hrs and 0.67L in 48hrs.    LOS: 9 days    [name] 11/13/2012  9:50 AM

## 2012-11-14 ENCOUNTER — Inpatient Hospital Stay (HOSPITAL_COMMUNITY): Payer: PRIVATE HEALTH INSURANCE

## 2012-11-14 LAB — CBC
HCT: 25 % — ABNORMAL LOW (ref 36.0–46.0)
Hemoglobin: 8.3 g/dL — ABNORMAL LOW (ref 12.0–15.0)
MCH: 28.3 pg (ref 26.0–34.0)
MCHC: 33.2 g/dL (ref 30.0–36.0)
MCV: 85.3 fL (ref 78.0–100.0)
Platelets: 319 10*3/uL (ref 150–400)
RBC: 2.93 MIL/uL — ABNORMAL LOW (ref 3.87–5.11)
RDW: 16.4 % — ABNORMAL HIGH (ref 11.5–15.5)
WBC: 20.6 10*3/uL — ABNORMAL HIGH (ref 4.0–10.5)

## 2012-11-14 LAB — BASIC METABOLIC PANEL
BUN: 15 mg/dL (ref 6–23)
CO2: 34 mEq/L — ABNORMAL HIGH (ref 19–32)
Calcium: 9.1 mg/dL (ref 8.4–10.5)
Chloride: 99 mEq/L (ref 96–112)
Creatinine, Ser: 0.78 mg/dL (ref 0.50–1.10)
GFR calc Af Amer: >90 mL/min (ref >90)
GFR calc non Af Amer: 89 mL/min — ABNORMAL LOW (ref >90)
Glucose, Bld: 128 mg/dL — ABNORMAL HIGH (ref 70–99)
Potassium: 3.6 mEq/L (ref 3.5–5.1)
Sodium: 140 mEq/L (ref 135–145)

## 2012-11-14 MED ORDER — DEXTROSE 5 % IV SOLN
1.0000 g | Freq: Two times a day (BID) | INTRAVENOUS | Status: DC
  Administered 2012-11-14: 1 g via INTRAVENOUS
  Filled 2012-11-14 (×2): qty 1

## 2012-11-14 MED ORDER — DEXTROSE 5 % IV SOLN
1.0000 g | Freq: Two times a day (BID) | INTRAVENOUS | Status: DC
  Administered 2012-11-15 – 2012-11-20 (×11): 1 g via INTRAVENOUS
  Filled 2012-11-14 (×12): qty 1

## 2012-11-14 MED ORDER — POTASSIUM CHLORIDE 10 MEQ/100ML IV SOLN
10.0000 meq | INTRAVENOUS | Status: AC
  Administered 2012-11-14 (×2): 10 meq via INTRAVENOUS
  Filled 2012-11-14 (×2): qty 100

## 2012-11-14 NOTE — Procedures (Signed)
US demonstrated a right pleural effusion, minimal on the left.  US guided right thoracentesis yielded 250 ml of dark bloody fluid.  Additional fluid would not drain although there was residual fluid on [place].  Will get follow up chest radiograph.

## 2012-11-14 NOTE — Progress Notes (Signed)
Chest tube dressing changed d/t moderate amt of bloody drainage noted. 4x4 and ABD pads applied. [name]

## 2012-11-14 NOTE — Progress Notes (Signed)
ABD dressing applied to chest tube suture sites post additional sutures d/t bleeding per Gershon Crane PA. Patient transported via wheelchair to ultrasound for thorocentesis.Mamie Levers

## 2012-11-14 NOTE — Progress Notes (Signed)
                   [address]            [place] [address]          [phone]     Moderate amount of bloody drainage leaking from anterior chest tube sites requiring frequent dressing changes. Using sterile technique I place #2 3-0 silk sutures with improvement (decreased amt draining. She is to get her thoracentesis which should also improve as this may be related to pleural effusion.  Gershon Crane PA-C

## 2012-11-14 NOTE — Progress Notes (Signed)
CARDIAC REHAB PHASE I   PRE:  Rate/Rhythm: 83SR  BP:  Supine:   Sitting: 132/53  Standing:    SaO2: 95%2L  MODE:  Ambulation: 300 ft   POST:  Rate/Rhythem: 109ST  BP:  Supine:   Sitting: 122/44  Standing:    SaO2: 98% 2L 1023-1055 Pt walked 300 ft on 2L with rolling walker and asst x 1 with very slow pace. Stopped several times to catch her breath. Pt did not feel she could add distance today. To recliner after walk. Left on 2L. Encouraged walks with staff later.  Duanne Limerick

## 2012-11-14 NOTE — Progress Notes (Signed)
Patient returned from thorocentesis. Appears to be upset. Patient c/o pain and and disappointment about her wait in radiology. Pain meds given. Call bell near. [name]

## 2012-11-14 NOTE — Progress Notes (Signed)
9 Days Post-Op Procedure(s) (LRB):  CORONARY ARTERY BYPASS GRAFTING (CABG) (N/A)  REPLACEMENT ASCENDING AORTA (N/A)  REDO STERNOTOMY (N/A)  INTRAOPERATIVE TRANSESOPHAGEAL ECHOCARDIOGRAM (N/A)  ENDOVEIN HARVEST OF GREATER SAPHENOUS VEIN   Subjective: No specific complaints, up in chair  Objective: Vital signs in last 24 hours: Temp:  [98.3 F (36.8 C)-98.8 F (37.1 C)] 98.5 F (36.9 C) (02/13 0532) Pulse Rate:  [86-100] 86 (02/13 0532) Resp:  [18-20] 18 (02/13 0532) BP: (114-143)/(46-67) 129/56 mmHg (02/13 0532) SpO2:  [98 %-99 %] 99 % (02/13 0532) Weight:  [78.926 kg (174 lb)] 78.926 kg (174 lb) (02/13 0500) Weight change: -0.771 kg (-1 lb 11.2 oz) Last BM Date: 11/12/12 (very small, per pt) Intake/Output from previous day: -1720 02/12 0701 - 02/13 0700 In: 480 [P.O.:480] Out: 2200 [Urine:2200] Intake/Output this shift:    PE:  General:alert and oriented, no specific complaints, though no BM in 2 days. Heart:S1S2 RRR with crisp click of valve Lungs:diminished in bases, no wheezes Abd:+ BS, soft, non tender Ext:tr edema   Lab Results:  Recent Labs  11/13/12 0530 11/14/12 0500  WBC 16.5* 20.6*  HGB 8.5* 8.3*  HCT 25.3* 25.0*  PLT 276 319   BMET  Recent Labs  11/13/12 0530 11/14/12 0500  NA 142 140  K 3.9 3.6  CL 101 99  CO2 32 34*  GLUCOSE 121* 128*  BUN 14 15  CREATININE 0.73 0.78  CALCIUM 9.3 9.1   No results found for this basename: TROPONINI, CK, MB,  in the last 72 hours  No results found for this basename: CHOL, HDL, LDLCALC, LDLDIRECT, TRIG, CHOLHDL   Lab Results  Component Value Date   HGBA1C 6.1* 11/04/2012       Studies/Results: Dg Chest 2 View  11/13/2012  *RADIOLOGY REPORT*  Clinical Data: Post aortic valve replacement, CABG  CHEST - 2 VIEW  Comparison: 11/12/2012; 11/11/2012; 11/04/2012  Findings:  Grossly unchanged cardiac silhouette and mediastinal contours post median sternotomy and CABG.  Stable positioning of support  apparatus.  No significant change and bilateral pleural effusions, right greater than left.  Grossly unchanged bibasilar heterogeneous opacities.  No new focal airspace opacity.  No definite evidence of pulmonary edema or pneumothorax.  Surgical clips overlie the right axilla.  Unchanged bones.  IMPRESSION: 1.  Grossly unchanged small bilateral pleural effusions, right greater than left. 2.  Grossly unchanged atelectasis/collapse of the right middle lobe and bibasilar opacities, atelectasis versus infiltrate.   Original Report Authenticated By: Tacey Ruiz, MD     Medications: I have reviewed the patient's current medications. Marland Kitchen amiodarone  200 mg Oral BID  . aspirin EC  81 mg Oral Daily  . atorvastatin  40 mg Oral q1800  . bisacodyl  10 mg Oral Daily   Or  . bisacodyl  10 mg Rectal Daily  . ceFEPime (MAXIPIME) IV  1 g Intravenous Q12H  . docusate sodium  200 mg Oral Daily  . ferrous fumarate-b12-vitamic C-folic acid  1 capsule Oral TID PC  . furosemide  40 mg Intravenous BID  . metoprolol tartrate  12.5 mg Oral BID  . pantoprazole  40 mg Oral Daily  . potassium chloride  10 mEq Intravenous Q1 Hr x 2  . sodium chloride  10-40 mL Intracatheter Q12H  . sodium chloride  3 mL Intravenous Q12H  . sodium chloride  3 mL Intravenous Q12H  . Warfarin - Physician Dosing Inpatient   Does not apply q1800   Assessment/Plan: Principal Problem:   Unstable  angina Active Problems:   CAD-severe LM disease- S/P CABG X 2 (SVG-OM, SVG-LAD) 11/05/12   S/P AVR, St Jude 2000   Dilated ascending aorta at cath 11/03/12 - S/P root replacement 11/05/12   Chronic anticoagulation   Contrast media allergy   HTN (hypertension)   Sarcoidosis  PLAN:Possible thoracentesis today.  Coumadin held today. SOB improved with IV lasix.  INR today 2.63,  Anemia stable.    LOS: 10 days   INGOLD,LAURA R 11/14/2012, 9:01 AM   ATTENDING ATTESTATION:  I have seen and examined the patient along with Nada Boozer, NP.  I have  reviewed the chart, notes and new data.  I agree with her's findings, examination & recommendations as noted above.  Feeling a bit more stable today.  Breathing a bit better.     PLAN:  No specific recommendations   Coumadin on hold for thoracentesis = hopefully tomorrow.  H/H stable -- ? Due to shorter lifespan of transfused blood intra-op.  Remains stable in NSR on Amio & BB with stable BP    Pietra Zuluaga W, M.D., M.S. [facility] [address] [place], [place]  [address]  [phone]  11/14/2012 9:33 AM

## 2012-11-14 NOTE — Progress Notes (Addendum)
[address]            [place] [address]          [phone]    9 Days Post-Op  Procedure(s) (LRB): CORONARY ARTERY BYPASS GRAFTING (CABG) (N/A) REPLACEMENT ASCENDING AORTA (N/A) REDO STERNOTOMY (N/A) INTRAOPERATIVE TRANSESOPHAGEAL ECHOCARDIOGRAM (N/A) ENDOVEIN HARVEST OF GREATER SAPHENOUS VEIN (Right) Subjective: Feels ok, SOB improving  Objective  Telemetry sinus  Temp:  [98.3 F (36.8 C)-98.8 F (37.1 C)] 98.5 F (36.9 C) (02/13 0532) Pulse Rate:  [86-100] 86 (02/13 0532) Resp:  [18-20] 18 (02/13 0532) BP: (114-143)/(46-67) 129/56 mmHg (02/13 0532) SpO2:  [98 %-99 %] 99 % (02/13 0532) Weight:  [174 lb (78.926 kg)] 174 lb (78.926 kg) (02/13 0500)   Intake/Output Summary (Last 24 hours) at 11/14/12 0739 Last data filed at 11/13/12 2300  Gross per 24 hour  Intake    480 ml  Output   2200 ml  Net  -1720 ml       General appearance: alert, cooperative and no distress Heart: regular rate and rhythm and soft flow murmur, crisp valve click Lungs: dim in bases Abdomen: soft nontender Extremities: + edema Wound: incisions healing well  Lab Results:  Recent Labs  11/13/12 0530 11/14/12 0500  NA 142 140  K 3.9 3.6  CL 101 99  CO2 32 34*  GLUCOSE 121* 128*  BUN 14 15  CREATININE 0.73 0.78  CALCIUM 9.3 9.1   No results found for this basename: AST, ALT, ALKPHOS, BILITOT, PROT, ALBUMIN,  in the last 72 hours No results found for this basename: LIPASE, AMYLASE,  in the last 72 hours  Recent Labs  11/13/12 0530 11/14/12 0500  WBC 16.5* 20.6*  HGB 8.5* 8.3*  HCT 25.3* 25.0*  MCV 84.9 85.3  PLT 276 319   No results found for this basename: CKTOTAL, CKMB, TROPONINI,  in the last 72 hours No components found with this basename: POCBNP,  No results found for this basename: DDIMER,  in the last 72 hours No results found for this basename: HGBA1C,  in the last 72 hours No results found for this basename: CHOL, HDL,  LDLCALC, TRIG, CHOLHDL,  in the last 72 hours No results found for this basename: TSH, T4TOTAL, FREET3, T3FREE, THYROIDAB,  in the last 72 hours No results found for this basename: VITAMINB12, FOLATE, FERRITIN, TIBC, IRON, RETICCTPCT,  in the last 72 hours  Medications: Scheduled . amiodarone  200 mg Oral BID  . aspirin EC  81 mg Oral Daily  . atorvastatin  40 mg Oral q1800  . bisacodyl  10 mg Oral Daily   Or  . bisacodyl  10 mg Rectal Daily  . cefUROXime  500 mg Oral BID WC  . docusate sodium  200 mg Oral Daily  . ferrous fumarate-b12-vitamic C-folic acid  1 capsule Oral TID PC  . furosemide  40 mg Intravenous BID  . metoprolol tartrate  12.5 mg Oral BID  . pantoprazole  40 mg Oral Daily  . potassium chloride  20 mEq Oral Daily  . sodium chloride  10-40 mL Intracatheter Q12H  . sodium chloride  3 mL Intravenous Q12H  . sodium chloride  3 mL Intravenous Q12H  . Warfarin - Physician Dosing Inpatient   Does not apply q1800     Radiology/Studies:  Dg Chest 2 View  11/13/2012  *RADIOLOGY REPORT*  Clinical Data: Post  aortic valve replacement, CABG  CHEST - 2 VIEW  Comparison: 11/12/2012; 11/11/2012; 11/04/2012  Findings:  Grossly unchanged cardiac silhouette and mediastinal contours post median sternotomy and CABG.  Stable positioning of support apparatus.  No significant change and bilateral pleural effusions, right greater than left.  Grossly unchanged bibasilar heterogeneous opacities.  No new focal airspace opacity.  No definite evidence of pulmonary edema or pneumothorax.  Surgical clips overlie the right axilla.  Unchanged bones.  IMPRESSION: 1.  Grossly unchanged small bilateral pleural effusions, right greater than left. 2.  Grossly unchanged atelectasis/collapse of the right middle lobe and bibasilar opacities, atelectasis versus infiltrate.   Original Report Authenticated By: Tacey Ruiz, MD     INR:2.63 Will add last result for INR, ABG once components are confirmed Will add  last 4 CBG results once components are confirmed  Assessment/Plan: S/P Procedure(s) (LRB): CORONARY ARTERY BYPASS GRAFTING (CABG) (N/A) REPLACEMENT ASCENDING AORTA (N/A) REDO STERNOTOMY (N/A) INTRAOPERATIVE TRANSESOPHAGEAL ECHOCARDIOGRAM (N/A) ENDOVEIN HARVEST OF GREATER SAPHENOUS VEIN (Right)  1 doing better with IV lasix, cont for now 2 UA positive- change to maxipime IV with Valve/graft, check culture(pending) 3 no coumadin today- poss thoracentesis if IR ok with INR 4 d/c epw's in am  5 nausea from po K+- will give runs IV Push pulm toilet/rehab     LOS: 10 days    GOLD,WAYNE E 2/13/20147:39 AM  patient examined and medical record reviewed,agree with above note.  > 100,000 gram nreg rods on urine culture-  Maxepime started VAN TRIGT III,Taeden Geller 11/14/2012

## 2012-11-15 ENCOUNTER — Inpatient Hospital Stay (HOSPITAL_COMMUNITY): Payer: PRIVATE HEALTH INSURANCE

## 2012-11-15 LAB — URINE CULTURE: Colony Count: >=100000

## 2012-11-15 LAB — CBC
HCT: 24.7 % — ABNORMAL LOW (ref 36.0–46.0)
Hemoglobin: 8.4 g/dL — ABNORMAL LOW (ref 12.0–15.0)
MCH: 29.3 pg (ref 26.0–34.0)
MCHC: 34 g/dL (ref 30.0–36.0)
MCV: 86.1 fL (ref 78.0–100.0)
Platelets: 353 10*3/uL (ref 150–400)
RBC: 2.87 MIL/uL — ABNORMAL LOW (ref 3.87–5.11)
RDW: 16.9 % — ABNORMAL HIGH (ref 11.5–15.5)
WBC: 20.8 10*3/uL — ABNORMAL HIGH (ref 4.0–10.5)

## 2012-11-15 LAB — BASIC METABOLIC PANEL
BUN: 15 mg/dL (ref 6–23)
CO2: 33 mEq/L — ABNORMAL HIGH (ref 19–32)
Calcium: 9.1 mg/dL (ref 8.4–10.5)
GFR calc non Af Amer: 72 mL/min — ABNORMAL LOW (ref >90)
Glucose, Bld: 124 mg/dL — ABNORMAL HIGH (ref 70–99)
Potassium: 3.5 mEq/L (ref 3.5–5.1)

## 2012-11-15 MED ORDER — FUROSEMIDE 40 MG PO TABS
40.0000 mg | ORAL_TABLET | Freq: Every day | ORAL | Status: DC
  Administered 2012-11-15 – 2012-11-20 (×6): 40 mg via ORAL
  Filled 2012-11-15 (×6): qty 1

## 2012-11-15 MED ORDER — WARFARIN SODIUM 2 MG PO TABS: 2.0000 mg | ORAL_TABLET | Freq: Every day | ORAL | Status: DC

## 2012-11-15 MED ORDER — AMIODARONE HCL 200 MG PO TABS: 200.0000 mg | ORAL_TABLET | Freq: Two times a day (BID) | ORAL | Status: DC

## 2012-11-15 MED ORDER — POTASSIUM CHLORIDE CRYS ER 20 MEQ PO TBCR
40.0000 meq | EXTENDED_RELEASE_TABLET | Freq: Every day | ORAL | Status: DC
  Administered 2012-11-15 – 2012-11-20 (×6): 40 meq via ORAL
  Filled 2012-11-15 (×6): qty 2

## 2012-11-15 MED ORDER — FUROSEMIDE 40 MG PO TABS: 40.0000 mg | ORAL_TABLET | Freq: Every day | ORAL | Status: DC

## 2012-11-15 MED ORDER — WARFARIN SODIUM 1 MG PO TABS
1.0000 mg | ORAL_TABLET | Freq: Once | ORAL | Status: AC
  Administered 2012-11-15: 1 mg via ORAL
  Filled 2012-11-15: qty 1

## 2012-11-15 MED ORDER — CEPHALEXIN 500 MG PO CAPS: 500.0000 mg | ORAL_CAPSULE | Freq: Two times a day (BID) | ORAL | Status: DC

## 2012-11-15 MED ORDER — FE FUMARATE-B12-VIT C-FA-IFC PO CAPS: 1.0000 | ORAL_CAPSULE | Freq: Three times a day (TID) | ORAL | Status: DC

## 2012-11-15 MED ORDER — OXYCODONE HCL 5 MG PO TABS: 5.0000 mg | ORAL_TABLET | ORAL | Status: DC | PRN

## 2012-11-15 MED ORDER — METOPROLOL TARTRATE 12.5 MG HALF TABLET: 12.5000 mg | ORAL_TABLET | Freq: Two times a day (BID) | ORAL | Status: DC

## 2012-11-15 MED ORDER — POTASSIUM CHLORIDE CRYS ER 20 MEQ PO TBCR: 40.0000 meq | EXTENDED_RELEASE_TABLET | Freq: Every day | ORAL | Status: DC

## 2012-11-15 NOTE — Progress Notes (Signed)
[facility]  DAILY PROGRESS NOTE   Subjective:  Very sore at chest tube site and still sore from thoracentesis; hard to sleep. Otherwise no complaints. Walked pretty well today. Maintaining NSR.  Objective:  Temp:  [98.2 F (36.8 C)-98.6 F (37 C)] 98.6 F (37 C) (02/14 0440) Pulse Rate:  [86-99] 93 (02/14 0440) Resp:  [17-20] 18 (02/14 0440) BP: (117-132)/(31-73) 124/31 mmHg (02/14 0440) SpO2:  [96 %-100 %] 96 % (02/14 0440) Weight:  [77.928 kg (171 lb 12.8 oz)] 77.928 kg (171 lb 12.8 oz) (02/14 0423) Weight change: -0.998 kg (-2 lb 3.2 oz)  Intake/Output from previous day: 02/13 0701 - 02/14 0700 In: 240 [P.O.:240] Out: 1650 [Urine:1650]  Intake/Output from this shift:    Medications: Current Facility-Administered Medications  Medication Dose Route Frequency Provider Last Rate Last Dose  . 0.9 %  sodium chloride infusion  250 mL Intravenous PRN Kerin Perna, MD      . acetaminophen (TYLENOL) tablet 650 mg  650 mg Oral Q6H PRN Kerin Perna, MD      . amiodarone (PACERONE) tablet 200 mg  200 mg Oral BID Kerin Perna, MD   200 mg at 11/14/12 2208  . aspirin EC tablet 81 mg  81 mg Oral Daily Kerin Perna, MD   81 mg at 11/14/12 1009  . atorvastatin (LIPITOR) tablet 40 mg  40 mg Oral q1800 Rowe Clack, PA   40 mg at 11/14/12 1830  . bisacodyl (DULCOLAX) EC tablet 10 mg  10 mg Oral Daily Rowe Clack, [place]   10 mg at 11/08/12 1208   Or  . bisacodyl (DULCOLAX) suppository 10 mg  10 mg Rectal Daily Rowe Clack, PA      . bisacodyl (DULCOLAX) EC tablet 10 mg  10 mg Oral Daily PRN Kerin Perna, MD       Or  . bisacodyl (DULCOLAX) suppository 10 mg  10 mg Rectal Daily PRN Kerin Perna, MD      . ceFEPIme (MAXIPIME) 1 g in dextrose 5 % 50 mL IVPB  1 g Intravenous Q12H Kerin Perna, MD   1 g at 11/15/12 0420  . docusate sodium (COLACE) capsule 200 mg  200 mg Oral Daily Rowe Clack, PA   200 mg at 11/14/12 1009  . ferrous  fumarate-b12-vitamic C-folic acid (TRINSICON / FOLTRIN) capsule 1 capsule  1 capsule Oral TID PC Kerin Perna, MD   1 capsule at 11/14/12 1830  . furosemide (LASIX) tablet 40 mg  40 mg Oral Daily Rowe Clack, PA      . guaiFENesin (MUCINEX) 12 hr tablet 600 mg  600 mg Oral Q12H PRN Kerin Perna, MD      . labetalol (NORMODYNE,TRANDATE) injection 10 mg  10 mg Intravenous Q2H PRN Purcell Nails, MD      . magnesium hydroxide (MILK OF MAGNESIA) suspension 30 mL  30 mL Oral Daily PRN Kerin Perna, MD      . metoprolol (LOPRESSOR) injection 2.5-5 mg  2.5-5 mg Intravenous Q2H PRN Rowe Clack, PA      . metoprolol tartrate (LOPRESSOR) tablet 12.5 mg  12.5 mg Oral BID Kerin Perna, MD   12.5 mg at 11/14/12 2208  . naphazoline-pheniramine (NAPHCON-A) 0.025-0.3 % ophthalmic solution 1 drop  1 drop Both Eyes QID PRN Rowe Clack, PA      . ondansetron [facility]) injection 4 mg  4 mg  Intravenous Q6H PRN Rowe Clack, PA   4 mg at 11/10/12 1127  . oxyCODONE (Oxy IR/ROXICODONE) immediate release tablet 5-10 mg  5-10 mg Oral Q3H PRN Kerin Perna, MD   10 mg at 11/15/12 1610  . pantoprazole (PROTONIX) EC tablet 40 mg  40 mg Oral Daily Rowe Clack, PA   40 mg at 11/14/12 1134  . potassium chloride SA (K-DUR,KLOR-CON) CR tablet 40 mEq  40 mEq Oral Daily Wayne E Gold, [place]      . sodium chloride 0.9 % injection 10-40 mL  10-40 mL Intracatheter Q12H Kerin Perna, MD   10 mL at 11/15/12 0500  . sodium chloride 0.9 % injection 3 mL  3 mL Intravenous Q12H Rowe Clack, PA   3 mL at 11/11/12 9604  . sodium chloride 0.9 % injection 3 mL  3 mL Intravenous Q12H Kerin Perna, MD      . sodium chloride 0.9 % injection 3 mL  3 mL Intravenous PRN Kerin Perna, MD      . traMADol [name]) tablet 50 mg  50 mg Oral Q6H PRN Kerin Perna, MD   50 mg at 11/12/12 1524  . warfarin (COUMADIN) tablet 1 mg  1 mg Oral ONCE-1800 Rowe Clack, [place]      . Warfarin - Physician Dosing Inpatient   Does not apply q1800  Kerin Perna, MD        Physical Exam: General appearance: alert, cooperative and mild distress Neck: no adenopathy, no carotid bruit, no JVD, supple, symmetrical, trachea midline and thyroid not enlarged, symmetric, no tenderness/mass/nodules Lungs: diminished breath sounds bilaterally Heart: regular rate and rhythm, no rub and crisp prosthetic valve clicks Abdomen: soft, non-tender; bowel sounds normal; no masses,  no organomegaly Extremities: extremities normal, atraumatic, no cyanosis or edema Neurologic: Grossly normal  Lab Results: Results for orders placed during the hospital encounter of 11/04/12 (from the past 48 hour(s))  PROTIME-INR     Status: Abnormal   Collection Time    11/14/12  5:00 AM      Result Value Range   Prothrombin Time 26.8 (*) 11.6 - 15.2 seconds   INR 2.63 (*) 0.00 - 1.49  CBC     Status: Abnormal   Collection Time    11/14/12  5:00 AM      Result Value Range   WBC 20.6 (*) 4.0 - 10.5 K/uL   RBC 2.93 (*) 3.87 - 5.11 MIL/uL   Hemoglobin 8.3 (*) 12.0 - 15.0 g/dL   HCT 54.0 (*) 98.1 - 19.1 %   MCV 85.3  78.0 - 100.0 fL   MCH 28.3  26.0 - 34.0 pg   MCHC 33.2  30.0 - 36.0 g/dL   RDW 47.8 (*) 29.5 - 62.1 %   Platelets 319  150 - 400 K/uL  BASIC METABOLIC PANEL     Status: Abnormal   Collection Time    11/14/12  5:00 AM      Result Value Range   Sodium 140  135 - 145 mEq/L   Potassium 3.6  3.5 - 5.1 mEq/L   Chloride 99  96 - 112 mEq/L   CO2 34 (*) 19 - 32 mEq/L   Glucose, Bld 128 (*) 70 - 99 mg/dL   BUN 15  6 - 23 mg/dL   Creatinine, Ser 3.08  0.50 - 1.10 mg/dL   Calcium 9.1  8.4 - 65.7 mg/dL   GFR calc non Af Amer 89 (*) >  90 mL/min   GFR calc Af Amer >90  >90 mL/min   Comment:            The eGFR has been calculated     using the CKD EPI equation.     This calculation has not been     validated in all clinical     situations.     eGFR's persistently     <90 mL/min signify     possible Chronic Kidney Disease.  GLUCOSE, CAPILLARY      Status: Abnormal   Collection Time    11/14/12  9:26 PM      Result Value Range   Glucose-Capillary 134 (*) 70 - 99 mg/dL   Comment 1 Notify RN    PROTIME-INR     Status: Abnormal   Collection Time    11/15/12  6:00 AM      Result Value Range   Prothrombin Time 21.8 (*) 11.6 - 15.2 seconds   INR 1.99 (*) 0.00 - 1.49  BASIC METABOLIC PANEL     Status: Abnormal   Collection Time    11/15/12  6:00 AM      Result Value Range   Sodium 142  135 - 145 mEq/L   Potassium 3.5  3.5 - 5.1 mEq/L   Chloride 100  96 - 112 mEq/L   CO2 33 (*) 19 - 32 mEq/L   Glucose, Bld 124 (*) 70 - 99 mg/dL   BUN 15  6 - 23 mg/dL   Creatinine, Ser 1.61  0.50 - 1.10 mg/dL   Calcium 9.1  8.4 - 09.6 mg/dL   GFR calc non Af Amer 72 (*) >90 mL/min   GFR calc Af Amer 83 (*) >90 mL/min   Comment:            The eGFR has been calculated     using the CKD EPI equation.     This calculation has not been     validated in all clinical     situations.     eGFR's persistently     <90 mL/min signify     possible Chronic Kidney Disease.  CBC     Status: Abnormal   Collection Time    11/15/12  6:00 AM      Result Value Range   WBC 20.8 (*) 4.0 - 10.5 K/uL   RBC 2.87 (*) 3.87 - 5.11 MIL/uL   Hemoglobin 8.4 (*) 12.0 - 15.0 g/dL   HCT 04.5 (*) 40.9 - 81.1 %   MCV 86.1  78.0 - 100.0 fL   MCH 29.3  26.0 - 34.0 pg   MCHC 34.0  30.0 - 36.0 g/dL   RDW 91.4 (*) 78.2 - 95.6 %   Platelets 353  150 - 400 K/uL    Imaging: Dg Chest 1 View  11/14/2012  *RADIOLOGY REPORT*  Clinical Data: Post right-sided thoracentesis, weakness  CHEST - 1 VIEW  Comparison: 11/13/2012  Findings:  No significant change in moderate sized right-sided pleural effusion post thoracentesis.  No pneumothorax.  Grossly unchanged enlarged cardiac silhouette and mediastinal contours post median sternotomy and CABG.  Stable positioning of support apparatus. Grossly unchanged atelectasis/collapse of the right middle lobe. Unchanged left basilar/retrocardiac  heterogeneous opacities and likely trace left-sided pleural effusion.  Right axillary surgical clips.  IMPRESSION: 1.  No significant change are moderate sized right-sided pleural effusion post thoracentesis.  No pneumothorax. 2.  Grossly unchanged right middle lobe atelectasis/collapse.   Original Report Authenticated By: Jonny Ruiz  Judithann Sheen, MD    Dg Chest 2 View  11/15/2012  *RADIOLOGY REPORT*  Clinical Data: Pleural effusion  CHEST - 2 VIEW  Comparison: Prior chest x-ray 11/14/2012  Findings: Stable position of left upper extremity PICC with the tip at the superior cavoatrial junction.  The patient is status post median sternotomy with evidence of multivessel CABG including LIMA bypass.  No significant interval change in the appearance of the chest with a moderately large left pleural effusion and total atelectasis of the right middle lobe.  There may be a trace left pleural effusion.  No acute osseous abnormality.  IMPRESSION:  1.  No significant change in the appearance of a moderately large right pleural effusion and total atelectasis of the right middle lobe. 2.  Stable position of left upper extremity PICC.   Original Report Authenticated By: Malachy Moan, M.D.    US Thoracentesis Asp Pleural Space W/img Guide  11/14/2012  *RADIOLOGY REPORT*  Clinical Data:  Post cardiac surgery with right pleural effusion.  ULTRASOUND GUIDED RIGHT THORACENTESIS  Comparison:  Chest radiograph 11/13/2012  An ultrasound guided thoracentesis was thoroughly discussed with the patient and questions answered.  The benefits, risks, alternatives and complications were also discussed.  The patient understands and wishes to proceed with the procedure.  Written consent was obtained.  Ultrasound was performed to localize and mark an adequate pocket of fluid in the right chest.  The area was then prepped and draped in the normal sterile fashion.  1% Lidocaine was used for local anesthesia.  Under ultrasound guidance a 19 gauge Yueh  catheter was introduced.  Thoracentesis was performed.  The catheter was removed and a dressing applied.  Complications:  None  Findings: A total of approximately 250 ml of bloody fluid was removed. A fluid sample was not sent for laboratory analysis. No significant left pleural effusion was identified. The Yueh catheter stopped draining after 250 ml.  Ultrasound demonstrated residual fluid within the right pleural space.  IMPRESSION: Successful ultrasound guided right thoracentesis yielding 250 ml of bloody pleural fluid.   Original Report Authenticated By: Richarda Overlie, M.D.     Assessment:  1. Principal Problem: 2.   Unstable angina 3. Active Problems: 4.   CAD-severe LM disease- S/P CABG X 2 (SVG-OM, SVG-LAD) 11/05/12 5.   S/P AVR, [employer] Jude 2000 6.   Dilated ascending aorta at cath 11/03/12 - S/P root replacement 11/05/12 7.   Chronic anticoagulation 8.   Contrast media allergy 9.   HTN (hypertension) 10.   Sarcoidosis 11.   Plan:  1. Possibly loculated, hemorraghic right pleural effusion  2. Mechanical AV prosthesis - INR near therapeutic 3. Pacing wires to be removed today 4. Blood loss anemia - stable  Time Spent Directly with Patient:  30 minutes  Length of Stay:  LOS: 11 days    [name] 11/15/2012, 10:05 AM

## 2012-11-15 NOTE — Progress Notes (Signed)
SATURATION QUALIFICATIONS: (This note is used to comply with regulatory documentation for home oxygen)  Patient Saturations on Room Air at Rest = 92%  Patient Saturations on Room Air while Ambulating = 87%  Patient Saturations on 2 Liters of oxygen while Ambulating = 93%  Please briefly explain why patient needs home oxygen:Pt desats without O2 with ambulation.   [facility] [phone] (pager)

## 2012-11-15 NOTE — Progress Notes (Signed)
CARDIAC REHAB PHASE I   PRE:  Rate/Rhythm: Sr with st depression  BP:  Supine:   Sitting: 112/48  Standing:    SaO2: 2lnc 98  MODE:  Ambulation: 400 ft  Two standing rest breaks   POST:  Rate/Rhythm: 109  BP:  Supine: 123/89  Sitting:  Standing:   SaO2: Removed o2 x 3 min o2 sat 87-88 with some complaints of SOB d/t reclined position in bed 2lnc replaced inc to 91-93 with   PLB  Pt reluctant to ambulate initially but did comply.  Pt with earlier ambulation at 1000 with Physical Therapy. Pt assisted to [facility] to void then in hallway x 1 assist and RW. Two standing rest breaks related to fatigue. Pt moves slowly but steady.  Attempted to leave O2 off pt o2 sat noted at 87% and increased quickly with O2 replaced back on at 2lnc.  Pt attributed the shortness of breath due to laying in bed in anticipation of PICC line dressing change and Pacing wire removal.  Pt repositioned for comfort.  Call bell in place.  Primary RN in room and noted RA o2 sat reading.  Pt denies any other needs.  Advised pt to ambulate with nursing staff at least two additional times to day with progression in length of ambulation.  Pt verbalized that she would try.  [phone]  Arna Medici

## 2012-11-15 NOTE — Progress Notes (Addendum)
11/04/2012 - 11/05/2012                   [address]            [place] [address]          [phone]    10 Days Post-Op  Procedure(s) (LRB): CORONARY ARTERY BYPASS GRAFTING (CABG) (N/A) REPLACEMENT ASCENDING AORTA (N/A) REDO STERNOTOMY (N/A) INTRAOPERATIVE TRANSESOPHAGEAL ECHOCARDIOGRAM (N/A) ENDOVEIN HARVEST OF GREATER SAPHENOUS VEIN (Right) Subjective: Feels a bit stronger/better  Objective  Telemetry sinus rhythm  Temp:  [98.2 F (36.8 C)-98.6 F (37 C)] 98.6 F (37 C) (02/14 0440) Pulse Rate:  [85-99] 93 (02/14 0440) Resp:  [17-20] 18 (02/14 0440) BP: (117-132)/(31-73) 124/31 mmHg (02/14 0440) SpO2:  [96 %-100 %] 96 % (02/14 0440) Weight:  [171 lb 12.8 oz (77.928 kg)] 171 lb 12.8 oz (77.928 kg) (02/14 0423)   Intake/Output Summary (Last 24 hours) at 11/15/12 0735 Last data filed at 11/15/12 0425  Gross per 24 hour  Intake    240 ml  Output   1650 ml  Net  -1410 ml       General appearance: alert, cooperative and no distress Heart: regular rate and rhythm and soft systolic murmur Lungs: dim Right > L lower fields Abdomen: soft, nontender Extremities: + edema Wound: incisions healing well  Lab Results:  Recent Labs  11/14/12 0500 11/15/12 0600  NA 140 142  K 3.6 3.5  CL 99 100  CO2 34* 33*  GLUCOSE 128* 124*  BUN 15 15  CREATININE 0.78 0.86  CALCIUM 9.1 9.1   No results found for this basename: AST, ALT, ALKPHOS, BILITOT, PROT, ALBUMIN,  in the last 72 hours No results found for this basename: LIPASE, AMYLASE,  in the last 72 hours  Recent Labs  11/14/12 0500 11/15/12 0600  WBC 20.6* 20.8*  HGB 8.3* 8.4*  HCT 25.0* 24.7*  MCV 85.3 86.1  PLT 319 353   No results found for this basename: CKTOTAL, CKMB, TROPONINI,  in the last 72 hours No components found with this basename: POCBNP,  No results found for this basename: DDIMER,  in the last 72 hours No results found for this basename: HGBA1C,  in the last 72 hours No  results found for this basename: CHOL, HDL, LDLCALC, TRIG, CHOLHDL,  in the last 72 hours No results found for this basename: TSH, T4TOTAL, FREET3, T3FREE, THYROIDAB,  in the last 72 hours No results found for this basename: VITAMINB12, FOLATE, FERRITIN, TIBC, IRON, RETICCTPCT,  in the last 72 hours  Medications: Scheduled . amiodarone  200 mg Oral BID  . aspirin EC  81 mg Oral Daily  . atorvastatin  40 mg Oral q1800  . bisacodyl  10 mg Oral Daily   Or  . bisacodyl  10 mg Rectal Daily  . ceFEPime (MAXIPIME) IV  1 g Intravenous Q12H  . docusate sodium  200 mg Oral Daily  . ferrous fumarate-b12-vitamic C-folic acid  1 capsule Oral TID PC  . metoprolol tartrate  12.5 mg Oral BID  . pantoprazole  40 mg Oral Daily  . sodium chloride  10-40 mL Intracatheter Q12H  . sodium chloride  3 mL Intravenous Q12H  . sodium chloride  3 mL Intravenous Q12H  . Warfarin - Physician Dosing Inpatient   Does not apply q1800     Radiology/Studies:  Dg Chest 1 View  11/14/2012  *RADIOLOGY REPORT*  Clinical Data: Post right-sided thoracentesis, weakness  CHEST - 1  VIEW  Comparison: 11/13/2012  Findings:  No significant change in moderate sized right-sided pleural effusion post thoracentesis.  No pneumothorax.  Grossly unchanged enlarged cardiac silhouette and mediastinal contours post median sternotomy and CABG.  Stable positioning of support apparatus. Grossly unchanged atelectasis/collapse of the right middle lobe. Unchanged left basilar/retrocardiac heterogeneous opacities and likely trace left-sided pleural effusion.  Right axillary surgical clips.  IMPRESSION: 1.  No significant change are moderate sized right-sided pleural effusion post thoracentesis.  No pneumothorax. 2.  Grossly unchanged right middle lobe atelectasis/collapse.   Original Report Authenticated By: Tacey Ruiz, MD    US Thoracentesis Asp Pleural Space W/img Guide  11/14/2012  *RADIOLOGY REPORT*  Clinical Data:  Post cardiac surgery with  right pleural effusion.  ULTRASOUND GUIDED RIGHT THORACENTESIS  Comparison:  Chest radiograph 11/13/2012  An ultrasound guided thoracentesis was thoroughly discussed with the patient and questions answered.  The benefits, risks, alternatives and complications were also discussed.  The patient understands and wishes to proceed with the procedure.  Written consent was obtained.  Ultrasound was performed to localize and mark an adequate pocket of fluid in the right chest.  The area was then prepped and draped in the normal sterile fashion.  1% Lidocaine was used for local anesthesia.  Under ultrasound guidance a 19 gauge Yueh catheter was introduced.  Thoracentesis was performed.  The catheter was removed and a dressing applied.  Complications:  None  Findings: A total of approximately 250 ml of bloody fluid was removed. A fluid sample was not sent for laboratory analysis. No significant left pleural effusion was identified. The Yueh catheter stopped draining after 250 ml.  Ultrasound demonstrated residual fluid within the right pleural space.  IMPRESSION: Successful ultrasound guided right thoracentesis yielding 250 ml of bloody pleural fluid.   Original Report Authenticated By: Richarda Overlie, M.D.     INR:1.99 Will add last result for INR, ABG once components are confirmed Will add last 4 CBG results once components are confirmed  Assessment/Plan: S/P Procedure(s) (LRB): CORONARY ARTERY BYPASS GRAFTING (CABG) (N/A) REPLACEMENT ASCENDING AORTA (N/A) REDO STERNOTOMY (N/A) INTRAOPERATIVE TRANSESOPHAGEAL ECHOCARDIOGRAM (N/A) ENDOVEIN HARVEST OF GREATER SAPHENOUS VEIN (Right)  1. Doing better, thoracentesis yielded 250 cc bloody drainage. INR 1.99,  D/C epw's today . Needs coumadin for mechanical valve but concerned that effusion is bloody and not fully drained. Will give 1 mg 2 WBC stable, with UTI, klebsiella- sens to cephalosporins, may be able to change to po keflex soon 3 fluid overload, will change to  po lasix 40mq daily/KCL 4 rhythm stable 5 push rehab/pulm toilet    LOS: 11 days    [name],Mariah E 2/14/20147:35 AM     CXR shows persistent RML atelectasis but improved R effusion Kelbsiella UTI cont IV Maxepime while in hospital due to recent aortic graft, prior AVR Slow progress will need SNF as she live alone Coumadin sensitive, 1 mg this pm

## 2012-11-15 NOTE — Progress Notes (Signed)
EPW removed with ends intact per order/protocol. No bleeding or ectopy noted. Patient instructed to lay in bed x 1 hr. Instructed to notify Rn of any s/s of distress; verbalized understanding. VSS. Call bell near. Mamie Levers

## 2012-11-15 NOTE — Clinical Social Work Psychosocial (Signed)
Clinical Social Work Department BRIEF PSYCHOSOCIAL ASSESSMENT 11/15/2012  Patient:  [name], [name]     Account Number:  [id number]     Admit date:  11/04/2012  Clinical Social Worker:  Thomasene Mohair  Date/Time:  11/15/2012 04:00 PM  Referred by:  Care Management  Date Referred:  11/15/2012 Referred for  SNF Placement   Other Referral:   Interview type:  Patient Other interview type:    PSYCHOSOCIAL DATA Living Status:  ALONE Admitted from facility:   Level of care:   Primary support name:  Donnie Mesa Primary support relationship to patient:  CHILD, ADULT Degree of support available:   adequate    CURRENT CONCERNS Current Concerns  Post-Acute Placement  Adjustment to Illness   Other Concerns:    SOCIAL WORK ASSESSMENT / PLAN CSW was referred to Pt by care manager for possible SNF placement. Pt lives alone and has adult children who work and are unable to stay and assist her during the day.  Pt is hoping to gome home with home health, but might need SNF at dc to help her continue to progress in her recovery. CSW discussed with Pt and she is agreeable.  CSW will begin SNF bed search for available facilities.   Assessment/plan status:  Psychosocial Support/Ongoing Assessment of Needs Other assessment/ plan:   Information/referral to community resources:   SNF list    PATIENT'S/FAMILY'S RESPONSE TO PLAN OF CARE: Pt is agreeable for SNF at dc.  Pt would prefer home health but has had a slow recovery d/t multiple challenges while in the hospital.   Frederico Hamman, LCSW [phone]

## 2012-11-15 NOTE — Progress Notes (Signed)
Physical Therapy Treatment Patient Details Name: [name] MRN: [id number] DOB: 09/01/1951 Today's Date: 11/15/2012 Time: 4098-1191 PT Time Calculation (min): 32 min  PT Assessment / Plan / Recommendation Comments on Treatment Session  Pt s/p redo CABG and TAA repair.  Pt progressing with mobility but will work with you with encouragement.  Pt educated for all sternal precautions and given handout.  Pt needs reenforcement to recall precautions however.   Pt needed cues for pursed lip breathing with ambultion to keep sats > 91% as well.  Reviewed incentive spirometer with pt as well and pt demonstrates appropriate use.  Educated to perform 10 x eevery hour.      Follow Up Recommendations  SNF;Supervision for mobility/OOB;Home health PT;Other (comment) (if additional assist arranged, can to Home with HHPT)                 Equipment Recommendations  None recommended by PT        Frequency Min 3X/week   Plan Discharge plan remains appropriate;Frequency remains appropriate    Precautions / Restrictions Precautions Precautions: Sternal;Fall Restrictions Weight Bearing Restrictions: No   Pertinent Vitals/Pain Desat to 87% on RA with ambulation, encouraged pursed lip breathing.  No pain    Mobility  Bed Mobility Bed Mobility: Not assessed Transfers Transfers: Stand to Sit;Sit to Stand Sit to Stand: 5: Supervision;From chair/3-in-1 Stand to Sit: 5: Supervision;To chair/3-in-1 Details for Transfer Assistance: cuing for hands on knees to stand for sternal precautions Ambulation/Gait Ambulation/Gait Assistance: 5: Supervision Ambulation Distance (Feet): 360 Feet Assistive device: Rolling walker Ambulation/Gait Assistance Details: cuing for posture and position in RW Gait Pattern: Step-through pattern;Decreased stride length Gait velocity: decreased Stairs: No Wheelchair Mobility Wheelchair Mobility: No    Exercises General Exercises - Lower Extremity Long Arc Quad:  AROM;Both;10 reps;Seated Hip Flexion/Marching: AROM;Both;10 reps;Seated   PT Goals Acute Rehab PT Goals PT Goal: Sit to Stand - Progress: Progressing toward goal PT Goal: Ambulate - Progress: Progressing toward goal Additional Goals PT Goal: Additional Goal #1 - Progress: Progressing toward goal  Visit Information  Last PT Received On: 11/15/12 Assistance Needed: +1    Subjective Data  Subjective: "I wish I was moving better."   Cognition  Cognition Overall Cognitive Status: Appears within functional limits for tasks assessed/performed Arousal/Alertness: Awake/alert Orientation Level: Appears intact for tasks assessed Behavior During Session: Flat affect    Balance  Static Standing Balance Static Standing - Balance Support: Bilateral upper extremity supported;During functional activity Static Standing - Level of Assistance: 6: Modified independent (Device/Increase time) Static Standing - Comment/# of Minutes: 3 minutes with RW  End of Session PT - End of Session Equipment Utilized During Treatment: Gait belt;Oxygen Activity Tolerance: Patient tolerated treatment well Patient left: in chair;with call bell/phone within reach Nurse Communication: Mobility status        INGOLD,Arlen Dupuis 11/15/2012, 1:34 PM  [facility] Acute Rehabilitation [phone] (pager)

## 2012-11-15 NOTE — Progress Notes (Signed)
Patient ambulated 300 ft using front wheel walker x1 assist. Ambulated on 2L nasal cannula; tends to desat to upper 80's with exertion. Returned to bedside chair w/out incidence. Call bell near. Mamie Levers

## 2012-11-16 LAB — PROTIME-INR: Prothrombin Time: 19.6 seconds — ABNORMAL HIGH (ref 11.6–15.2)

## 2012-11-16 MED ORDER — WARFARIN SODIUM 3 MG PO TABS
3.0000 mg | ORAL_TABLET | Freq: Once | ORAL | Status: AC
  Administered 2012-11-16: 3 mg via ORAL
  Filled 2012-11-16: qty 1

## 2012-11-16 NOTE — Progress Notes (Signed)
CARDIAC REHAB PHASE I   PRE:  Rate/Rhythm: 92 SR  BP:  Supine:   Sitting:118/60  Standing:    SaO2: 99 % 2L   MODE:  Ambulation:150  ft   POST:  Rate/Rhythem: 96 SR  BP:  Supine:   Sitting: 122/70  Standing:    SaO2: 99 % on 2L   Pt ambulated times 2 assist with RW, Patient very slow and steady. Patient took 2 rest breaks due to being fatigued. Checked patients o2 and her sats were 96% on 2L. Patient decided to only walk 150 ft. Encouraged to do more at a later time. Patient placed on 1L due to trying to wean her off. Sats were doing great. Floor RN notified. Will continue to follow patient on Monday. Patient back to recliner with call bell in reach.   [phone] Keeana Pieratt, Toni Amend

## 2012-11-16 NOTE — Progress Notes (Signed)
[facility]  Subjective: Pain has improved. She is using less supplemental O2. No SOB. She has been ambulating the unit with very little difficulty. No complaints.  Objective: Vital signs in last 24 hours: Temp:  [97.6 F (36.4 C)-98.2 F (36.8 C)] 97.9 F (36.6 C) (02/15 0405) Pulse Rate:  [76-88] 81 (02/15 0405) Resp:  [18-20] 19 (02/15 0405) BP: (100-135)/(44-89) 100/45 mmHg (02/15 0405) SpO2:  [87 %-100 %] 100 % (02/15 0405) Weight:  [172 lb 11.2 oz (78.336 kg)] 172 lb 11.2 oz (78.336 kg) (02/15 0405) Last BM Date: 11/13/12  Intake/Output from previous day: 02/14 0701 - 02/15 0700 In: 360 [P.O.:360] Out: 800 [Urine:800] Intake/Output this shift:    Medications Current Facility-Administered Medications  Medication Dose Route Frequency Provider Last Rate Last Dose  . 0.9 %  sodium chloride infusion  250 mL Intravenous PRN Kerin Perna, MD      . acetaminophen (TYLENOL) tablet 650 mg  650 mg Oral Q6H PRN Kerin Perna, MD      . amiodarone (PACERONE) tablet 200 mg  200 mg Oral BID Kerin Perna, MD   200 mg at 11/15/12 2159  . aspirin EC tablet 81 mg  81 mg Oral Daily Kerin Perna, MD   81 mg at 11/15/12 1207  . atorvastatin (LIPITOR) tablet 40 mg  40 mg Oral q1800 Rowe Clack, PA   40 mg at 11/15/12 1722  . bisacodyl (DULCOLAX) EC tablet 10 mg  10 mg Oral Daily Rowe Clack, PA   10 mg at 11/15/12 1350   Or  . bisacodyl (DULCOLAX) suppository 10 mg  10 mg Rectal Daily Rowe Clack, PA      . bisacodyl (DULCOLAX) EC tablet 10 mg  10 mg Oral Daily PRN Kerin Perna, MD       Or  . bisacodyl (DULCOLAX) suppository 10 mg  10 mg Rectal Daily PRN Kerin Perna, MD      . ceFEPIme (MAXIPIME) 1 g in dextrose 5 % 50 mL IVPB  1 g Intravenous Q12H Kerin Perna, MD   1 g at 11/16/12 0330  . docusate sodium (COLACE) capsule 200 mg  200 mg Oral Daily Rowe Clack, PA   200 mg at 11/15/12 1350  . ferrous fumarate-b12-vitamic C-folic acid  (TRINSICON / FOLTRIN) capsule 1 capsule  1 capsule Oral TID PC Kerin Perna, MD   1 capsule at 11/15/12 1722  . furosemide (LASIX) tablet 40 mg  40 mg Oral Daily Rowe Clack, PA   40 mg at 11/15/12 1350  . guaiFENesin (MUCINEX) 12 hr tablet 600 mg  600 mg Oral Q12H PRN Kerin Perna, MD      . labetalol (NORMODYNE,TRANDATE) injection 10 mg  10 mg Intravenous Q2H PRN Purcell Nails, MD      . magnesium hydroxide (MILK OF MAGNESIA) suspension 30 mL  30 mL Oral Daily PRN Kerin Perna, MD      . metoprolol (LOPRESSOR) injection 2.5-5 mg  2.5-5 mg Intravenous Q2H PRN Rowe Clack, PA      . metoprolol tartrate (LOPRESSOR) tablet 12.5 mg  12.5 mg Oral BID Kerin Perna, MD   12.5 mg at 11/15/12 2158  . naphazoline-pheniramine (NAPHCON-A) 0.025-0.3 % ophthalmic solution 1 drop  1 drop Both Eyes QID PRN Rowe Clack, PA      . ondansetron [facility]) injection 4 mg  4 mg Intravenous Q6H PRN Deniece Portela  E Gold, PA   4 mg at 11/10/12 1127  . oxyCODONE (Oxy IR/ROXICODONE) immediate release tablet 5-10 mg  5-10 mg Oral Q3H PRN Kerin Perna, MD   10 mg at 11/15/12 1638  . pantoprazole (PROTONIX) EC tablet 40 mg  40 mg Oral Daily Rowe Clack, PA   40 mg at 11/15/12 1208  . potassium chloride SA (K-DUR,KLOR-CON) CR tablet 40 mEq  40 mEq Oral Daily Rowe Clack, PA   40 mEq at 11/15/12 1208  . sodium chloride 0.9 % injection 10-40 mL  10-40 mL Intracatheter Q12H Kerin Perna, MD   10 mL at 11/15/12 1842  . sodium chloride 0.9 % injection 3 mL  3 mL Intravenous Q12H Rowe Clack, PA   3 mL at 11/15/12 1212  . sodium chloride 0.9 % injection 3 mL  3 mL Intravenous Q12H Kerin Perna, MD   3 mL at 11/15/12 1211  . sodium chloride 0.9 % injection 3 mL  3 mL Intravenous PRN Kerin Perna, MD      . traMADol [name]) tablet 50 mg  50 mg Oral Q6H PRN Kerin Perna, MD   50 mg at 11/12/12 1524  . warfarin (COUMADIN) tablet 3 mg  3 mg Oral ONCE-1800 Rowe Clack, [place]      . Warfarin - Physician Dosing  Inpatient   Does not apply q1800 Kerin Perna, MD        PE: General appearance: alert, cooperative and no distress Lungs: decreased BS over the RLL. CTB on the left. Heart: regular rate and rhythm Extremities: 1+ BLEE Pulses: 2+ and symmetric Skin: warm and dry Neurologic: Grossly normal  Lab Results:   Recent Labs  11/14/12 0500 11/15/12 0600  WBC 20.6* 20.8*  HGB 8.3* 8.4*  HCT 25.0* 24.7*  PLT 319 353   BMET  Recent Labs  11/14/12 0500 11/15/12 0600  NA 140 142  K 3.6 3.5  CL 99 100  CO2 34* 33*  GLUCOSE 128* 124*  BUN 15 15  CREATININE 0.78 0.86  CALCIUM 9.1 9.1   PT/INR  Recent Labs  11/14/12 0500 11/15/12 0600 11/16/12 0445  LABPROT 26.8* 21.8* 19.6*  INR 2.63* 1.99* 1.72*   Studies/Results: CXR 2/14 *RADIOLOGY REPORT*  Clinical Data: Pleural effusion  CHEST - 2 VIEW  Comparison: Prior chest x-ray 11/14/2012  Findings: Stable position of left upper extremity PICC with the tip  at the superior cavoatrial junction. The patient is status post  median sternotomy with evidence of multivessel CABG including LIMA  bypass. No significant interval change in the appearance of the  chest with a moderately large left pleural effusion and total  atelectasis of the right middle lobe. There may be a trace left  pleural effusion. No acute osseous abnormality.  IMPRESSION:  1. No significant change in the appearance of a moderately large  right pleural effusion and total atelectasis of the right middle  lobe.  2. Stable position of left upper extremity PICC.  Original Report Authenticated By: Malachy Moan, M.D.  Assessment/Plan   Principal Problem:   Unstable angina Active Problems:   CAD-severe LM disease- S/P CABG X 2 (SVG-OM, SVG-LAD) 11/05/12   S/P AVR, [employer] Jude 2000   Dilated ascending aorta at cath 11/03/12 - S/P root replacement 11/05/12   Chronic anticoagulation   Contrast media allergy   HTN (hypertension)   Sarcoidosis  Plan:  Day 11 s/p  CABG and ascending aorta replacement. She is  progressing well. She is endorsing less chest wall pain today. She is on warfarin for [facility], due to mechanical AV prosthesis. INR is subtherapeutic and down from yesterday. INR today is 1.72 (1.99 yesterday). Per CT surgery note, she will get 3 mg of warfarin today. Vitals are stable. She is maintaining NSR on telemetry. Has been ambulating around unit with PT w/ very little difficulty. Will continue to monitor. MD to follow with further recommendation.     LOS: 12 days    Brittainy M. Delmer Islam 11/16/2012 9:33 AM  Agree with note written by Boyce Medici  PAC  POD # 11 CABG X 2 and Ascending Ao replacement. NSR. INR still sub therapeutic. Getting Coumadin A/C. Ambulating with [facility]. SNF at D/C for rehab. Nl progression. Rx per [facility]. F/U with Dr. Rennis Golden at [facility].   Runell Gess 11/16/2012 10:03 AM

## 2012-11-16 NOTE — Progress Notes (Addendum)
[name]            [place] [address]          [phone]    11 Days Post-Op  Procedure(s) (LRB): CORONARY ARTERY BYPASS GRAFTING (CABG) (N/A) REPLACEMENT ASCENDING AORTA (N/A) REDO STERNOTOMY (N/A) INTRAOPERATIVE TRANSESOPHAGEAL ECHOCARDIOGRAM (N/A) ENDOVEIN HARVEST OF GREATER SAPHENOUS VEIN (Right) Subjective: Feels better , still fatigues easily  Objective  Telemetry sinus rhythm with occas missed beat  Temp:  [97.6 F (36.4 C)-98.2 F (36.8 C)] 97.9 F (36.6 C) (02/15 0405) Pulse Rate:  [76-88] 81 (02/15 0405) Resp:  [18-20] 19 (02/15 0405) BP: (100-135)/(44-89) 100/45 mmHg (02/15 0405) SpO2:  [87 %-100 %] 100 % (02/15 0405) Weight:  [172 lb 11.2 oz (78.336 kg)] 172 lb 11.2 oz (78.336 kg) (02/15 0405)   Intake/Output Summary (Last 24 hours) at 11/16/12 0916 Last data filed at 11/16/12 0417  Gross per 24 hour  Intake    240 ml  Output    800 ml  Net   -560 ml       General appearance: alert, cooperative, fatigued and no distress Heart: regular rate and rhythm and 2/6 sys murmur Lungs: dim right> left lower fields Abdomen: soft, nontender Extremities: edema slowly improving Wound: incisions healing well  Lab Results:  Recent Labs  11/14/12 0500 11/15/12 0600  NA 140 142  K 3.6 3.5  CL 99 100  CO2 34* 33*  GLUCOSE 128* 124*  BUN 15 15  CREATININE 0.78 0.86  CALCIUM 9.1 9.1   No results found for this basename: AST, ALT, ALKPHOS, BILITOT, PROT, ALBUMIN,  in the last 72 hours No results found for this basename: LIPASE, AMYLASE,  in the last 72 hours  Recent Labs  11/14/12 0500 11/15/12 0600  WBC 20.6* 20.8*  HGB 8.3* 8.4*  HCT 25.0* 24.7*  MCV 85.3 86.1  PLT 319 353   No results found for this basename: CKTOTAL, CKMB, TROPONINI,  in the last 72 hours No components found with this basename: POCBNP,  No results found for this basename: DDIMER,  in the last 72 hours No results found for this basename:  HGBA1C,  in the last 72 hours No results found for this basename: CHOL, HDL, LDLCALC, TRIG, CHOLHDL,  in the last 72 hours No results found for this basename: TSH, T4TOTAL, FREET3, T3FREE, THYROIDAB,  in the last 72 hours No results found for this basename: VITAMINB12, FOLATE, FERRITIN, TIBC, IRON, RETICCTPCT,  in the last 72 hours  Medications: Scheduled . amiodarone  200 mg Oral BID  . aspirin EC  81 mg Oral Daily  . atorvastatin  40 mg Oral q1800  . bisacodyl  10 mg Oral Daily   Or  . bisacodyl  10 mg Rectal Daily  . ceFEPime (MAXIPIME) IV  1 g Intravenous Q12H  . docusate sodium  200 mg Oral Daily  . ferrous fumarate-b12-vitamic C-folic acid  1 capsule Oral TID PC  . furosemide  40 mg Oral Daily  . metoprolol tartrate  12.5 mg Oral BID  . pantoprazole  40 mg Oral Daily  . potassium chloride  40 mEq Oral Daily  . sodium chloride  10-40 mL Intracatheter Q12H  . sodium chloride  3 mL Intravenous Q12H  . sodium chloride  3 mL Intravenous Q12H  . Warfarin - Physician Dosing Inpatient   Does not apply [phone]  Radiology/Studies:  Dg Chest 1 View  11/14/2012  *RADIOLOGY REPORT*  Clinical Data: Post right-sided thoracentesis, weakness  CHEST - 1 VIEW  Comparison: 11/13/2012  Findings:  No significant change in moderate sized right-sided pleural effusion post thoracentesis.  No pneumothorax.  Grossly unchanged enlarged cardiac silhouette and mediastinal contours post median sternotomy and CABG.  Stable positioning of support apparatus. Grossly unchanged atelectasis/collapse of the right middle lobe. Unchanged left basilar/retrocardiac heterogeneous opacities and likely trace left-sided pleural effusion.  Right axillary surgical clips.  IMPRESSION: 1.  No significant change are moderate sized right-sided pleural effusion post thoracentesis.  No pneumothorax. 2.  Grossly unchanged right middle lobe atelectasis/collapse.   Original Report Authenticated By: Tacey Ruiz, MD    Dg Chest 2  View  11/15/2012  *RADIOLOGY REPORT*  Clinical Data: Pleural effusion  CHEST - 2 VIEW  Comparison: Prior chest x-ray 11/14/2012  Findings: Stable position of left upper extremity PICC with the tip at the superior cavoatrial junction.  The patient is status post median sternotomy with evidence of multivessel CABG including LIMA bypass.  No significant interval change in the appearance of the chest with a moderately large left pleural effusion and total atelectasis of the right middle lobe.  There may be a trace left pleural effusion.  No acute osseous abnormality.  IMPRESSION:  1.  No significant change in the appearance of a moderately large right pleural effusion and total atelectasis of the right middle lobe. 2.  Stable position of left upper extremity PICC.   Original Report Authenticated By: Malachy Moan, M.D.    US Thoracentesis Asp Pleural Space W/img Guide  11/14/2012  *RADIOLOGY REPORT*  Clinical Data:  Post cardiac surgery with right pleural effusion.  ULTRASOUND GUIDED RIGHT THORACENTESIS  Comparison:  Chest radiograph 11/13/2012  An ultrasound guided thoracentesis was thoroughly discussed with the patient and questions answered.  The benefits, risks, alternatives and complications were also discussed.  The patient understands and wishes to proceed with the procedure.  Written consent was obtained.  Ultrasound was performed to localize and mark an adequate pocket of fluid in the right chest.  The area was then prepped and draped in the normal sterile fashion.  1% Lidocaine was used for local anesthesia.  Under ultrasound guidance a 19 gauge Yueh catheter was introduced.  Thoracentesis was performed.  The catheter was removed and a dressing applied.  Complications:  None  Findings: A total of approximately 250 ml of bloody fluid was removed. A fluid sample was not sent for laboratory analysis. No significant left pleural effusion was identified. The Yueh catheter stopped draining after 250 ml.   Ultrasound demonstrated residual fluid within the right pleural space.  IMPRESSION: Successful ultrasound guided right thoracentesis yielding 250 ml of bloody pleural fluid.   Original Report Authenticated By: Richarda Overlie, M.D.     INR:1.72 Will add last result for INR, ABG once components are confirmed Will add last 4 CBG results once components are confirmed  Assessment/Plan: S/P Procedure(s) (LRB): CORONARY ARTERY BYPASS GRAFTING (CABG) (N/A) REPLACEMENT ASCENDING AORTA (N/A) REDO STERNOTOMY (N/A) INTRAOPERATIVE TRANSESOPHAGEAL ECHOCARDIOGRAM (N/A) ENDOVEIN HARVEST OF GREATER SAPHENOUS VEIN (Right)  1 overall stable with slow/steady progress 2 will give 3 mg coumadin today 3 will need SNF short term, cont current rx/tx   LOS: 12 days    [name],[name] 2/15/20149:16 AM  Sensitive to coumadin I have seen and examined [name] and agree with the above assessment  and plan.  Delight Ovens MD Beeper  [phone] Office [phone] 11/16/2012 11:55 AM

## 2012-11-17 LAB — PROTIME-INR: INR: 1.43 (ref 0.00–1.49)

## 2012-11-17 MED ORDER — WARFARIN SODIUM 5 MG PO TABS
5.0000 mg | ORAL_TABLET | Freq: Once | ORAL | Status: AC
  Administered 2012-11-17: 5 mg via ORAL
  Filled 2012-11-17: qty 1

## 2012-11-17 NOTE — Progress Notes (Addendum)
[address]            [place] [address]          [phone]    12 Days Post-Op  Procedure(s) (LRB): CORONARY ARTERY BYPASS GRAFTING (CABG) (N/A) REPLACEMENT ASCENDING AORTA (N/A) REDO STERNOTOMY (N/A) INTRAOPERATIVE TRANSESOPHAGEAL ECHOCARDIOGRAM (N/A) ENDOVEIN HARVEST OF GREATER SAPHENOUS VEIN (Right) Subjective:  Each day feels a little better  Objective  Telemetry sinus   Temp:  [97.9 F (36.6 C)-99.2 F (37.3 C)] 97.9 F (36.6 C) (02/16 0425) Pulse Rate:  [84-97] 84 (02/16 0425) Resp:  [18-20] 19 (02/16 0425) BP: (94-119)/(40-78) 97/78 mmHg (02/16 0425) SpO2:  [100 %] 100 % (02/16 0425) Weight:  [171 lb 12.8 oz (77.928 kg)] 171 lb 12.8 oz (77.928 kg) (02/16 0425)   Intake/Output Summary (Last 24 hours) at 11/17/12 0810 Last data filed at 11/17/12 0634  Gross per 24 hour  Intake    350 ml  Output    750 ml  Net   -400 ml       General appearance: alert, cooperative and no distress Heart: regular rate and rhythm and 2/6 sys murmur, crisp valve click Lungs: dim right base> left base  Abdomen: benign Extremities: minor edema, much improved Wound: incisions healing well  Lab Results:  Recent Labs  11/15/12 0600  NA 142  K 3.5  CL 100  CO2 33*  GLUCOSE 124*  BUN 15  CREATININE 0.86  CALCIUM 9.1   No results found for this basename: AST, ALT, ALKPHOS, BILITOT, PROT, ALBUMIN,  in the last 72 hours No results found for this basename: LIPASE, AMYLASE,  in the last 72 hours  Recent Labs  11/15/12 0600  WBC 20.8*  HGB 8.4*  HCT 24.7*  MCV 86.1  PLT 353   No results found for this basename: CKTOTAL, CKMB, TROPONINI,  in the last 72 hours No components found with this basename: POCBNP,  No results found for this basename: DDIMER,  in the last 72 hours No results found for this basename: HGBA1C,  in the last 72 hours No results found for this basename: CHOL, HDL, LDLCALC, TRIG, CHOLHDL,  in the last 72 hours No  results found for this basename: TSH, T4TOTAL, FREET3, T3FREE, THYROIDAB,  in the last 72 hours No results found for this basename: VITAMINB12, FOLATE, FERRITIN, TIBC, IRON, RETICCTPCT,  in the last 72 hours  Medications: Scheduled . amiodarone  200 mg Oral BID  . aspirin EC  81 mg Oral Daily  . atorvastatin  40 mg Oral q1800  . bisacodyl  10 mg Oral Daily   Or  . bisacodyl  10 mg Rectal Daily  . ceFEPime (MAXIPIME) IV  1 g Intravenous Q12H  . docusate sodium  200 mg Oral Daily  . ferrous fumarate-b12-vitamic C-folic acid  1 capsule Oral TID PC  . furosemide  40 mg Oral Daily  . metoprolol tartrate  12.5 mg Oral BID  . pantoprazole  40 mg Oral Daily  . potassium chloride  40 mEq Oral Daily  . sodium chloride  10-40 mL Intracatheter Q12H  . sodium chloride  3 mL Intravenous Q12H  . sodium chloride  3 mL Intravenous Q12H  . Warfarin - Physician Dosing Inpatient   Does not apply q1800     Radiology/Studies:  No results found.  INR:1.43 Will add last result for INR, ABG once components are confirmed  Will add last 4 CBG results once components are confirmed  Assessment/Plan: S/P Procedure(s) (LRB): CORONARY ARTERY BYPASS GRAFTING (CABG) (N/A) REPLACEMENT ASCENDING AORTA (N/A) REDO STERNOTOMY (N/A) INTRAOPERATIVE TRANSESOPHAGEAL ECHOCARDIOGRAM (N/A) ENDOVEIN HARVEST OF GREATER SAPHENOUS VEIN (Right)  1 INR- decreased, will give 5 mg today 2 right effusion seems larger clinically bigger, will check CXR in am 3 cont to wean O2, recheck pBNP in am 4 rhythm stable 5 may be ready for SNF soon  LOS: 13 days    GOLD,WAYNE E 2/16/20148:10 AM  I have seen and examined Lucina Mellow and agree with the above assessment  and plan.  Delight Ovens MD Beeper [phone] Office [phone] 11/17/2012 12:33 PM

## 2012-11-17 NOTE — Progress Notes (Signed)
Patient c/o constipation, discomfort and requested laxative. Patient was given 10mg  PO Dulcolax at 2212. Patient had a large, black stool at approximately 0400. Patient stated that she felt relieved, will continue to monitor.

## 2012-11-17 NOTE — Clinical Social Work Placement (Signed)
Clinical Social Work Department CLINICAL SOCIAL WORK PLACEMENT NOTE 11/17/2012  Patient:  [name], [name]  Account Number:  [id number] Admit date:  11/04/2012  Clinical Social Worker:  Skip Mayer  Date/time:  11/17/2012 01:00 PM  Clinical Social Work is seeking post-discharge placement for this patient at the following level of care:   SKILLED NURSING   (*CSW will update this form in Epic as items are completed)     Patient/family provided with [facility] Department of Clinical Social Work's list of facilities offering this level of care within the geographic area requested by the patient (or if unable, by the patient's family).    Patient/family informed of their freedom to choose among providers that offer the needed level of care, that participate in Medicare, Medicaid or managed care program needed by the patient, have an available bed and are willing to accept the patient.    Patient/family informed of [facility]' ownership interest in [facility], as well as of the fact that they are under no obligation to receive care at this facility.  PASARR submitted to EDS on 11/17/2012 PASARR number received from EDS on 11/17/2012  FL2 transmitted to all facilities in geographic area requested by pt/family on  11/17/2012 FL2 transmitted to all facilities within larger geographic area on   Patient informed that his/her managed care company has contracts with or will negotiate with  certain facilities, including the following:     Patient/family informed of bed offers received:   Patient chooses bed at  Physician recommends and patient chooses bed at    Patient to be transferred to  on   Patient to be transferred to facility by   The following physician request were entered in Epic:   Additional Comments:

## 2012-11-17 NOTE — Progress Notes (Signed)
Patient stated that she didn't want to ambulate on night shift. Will continue to monitor.

## 2012-11-17 NOTE — Progress Notes (Signed)
Pt ambulated in hallway 350 ft with wheelchair on 1 Liter of oxygen, sats were 96 % on 1 liter. Pt tolerated activity well. Will continue to monitor.

## 2012-11-18 ENCOUNTER — Inpatient Hospital Stay (HOSPITAL_COMMUNITY): Payer: PRIVATE HEALTH INSURANCE

## 2012-11-18 LAB — PRO B NATRIURETIC PEPTIDE: Pro B Natriuretic peptide (BNP): 946.5 pg/mL — ABNORMAL HIGH (ref 0–125)

## 2012-11-18 LAB — PROTIME-INR
INR: 1.41 (ref 0.00–1.49)
Prothrombin Time: 16.9 seconds — ABNORMAL HIGH (ref 11.6–15.2)

## 2012-11-18 MED ORDER — WARFARIN SODIUM 5 MG PO TABS
5.0000 mg | ORAL_TABLET | Freq: Every day | ORAL | Status: DC
  Administered 2012-11-18 – 2012-11-19 (×2): 5 mg via ORAL
  Filled 2012-11-18 (×3): qty 1

## 2012-11-18 MED ORDER — WARFARIN SODIUM 5 MG PO TABS: 5.0000 mg | ORAL_TABLET | Freq: Every day | ORAL | Status: DC

## 2012-11-18 NOTE — Discharge Summary (Addendum)
[address]            [place] [address]          [phone]         Discharge Summary  Name: [name] DOB: Jun 11, 1951 62 y.o. MRN: [id number]   Admission Date: 11/04/2012 Discharge Date: 11/19/2012    Admitting Diagnosis: Chest pain   Discharge Diagnosis:  Coronary artery disease Unstable angina Dilated ascending thoracic aorta Right pleural effusion Klebsiella urinary tract infection Expected postoperative blood loss anemia Postoperative atrial fibrillation History of previous aortic valve replacement- 2000   Past Medical History  Diagnosis Date  . Coronary artery disease 2000    aortic mechanical valve replacement  . Anginal pain   . Shortness of breath      Procedures: REDO STERNOTOMY - 11/05/2012   CORONARY ARTERY BYPASS GRAFTING x 2 (Saphenous vein graft to left anterior descending, saphenous vein graft to circumflex marginal)  ENDOSCOPIC VEIN HARVEST RIGHT LEG  REDO REPLACEMENT ASCENDING AORTA (30 mm Hemashield graft)      HPI:  The patient is a 62 y.o. female with a history of Bentall procedure (St. Jude valve conduit) in 2000 for aortic insufficiency.  At that time, she had insignificant coronary artery disease.  She had a Myoview study in April 2013 which was normal.  Over the past several weeks, she has had episodic chest pain with exertion with associated shortness of breath.  She was seen by Dr. Rennis Golden, and cardiac catheterization was recommended. This was performed on the date of admission, and demonstrated ostial left main stenosis at the site of a previous Bentall root replacement. The right coronary ostia was not well demonstrated. The coronary injection showed the LAD and a dominant circumflex which appeared to be adequate targets for grafting. The aortic valve appeared to be functioning well. The aortic root injection showed evidence of a dilated ascending thoracic aorta.  Because of these findings,  the patient was admitted for further cardiac workup.     Hospital Course:  The patient was admitted to [facility] on 11/04/2012. A CTA was performed which showed the ascending thoracic aorta above the Dacron graft to be 4.7 cm in diameter. There was no evidence of dissection or hematoma or ulceration. The patient had no chest pain following cardiac cath.  She was started on a heparin infusion. A cardiac surgery consult was requested and Dr. Donata Clay saw the patient.  He recommended coronary artery bypass grafting with replacement of her ascending aorta.  All risks, benefits and alternatives of surgery were explained in detail, and the patient agreed to proceed. The patient was taken to the operating room and underwent the above procedure.    The postoperative course was notable for atrial fibrillation, which was treated with Amiodarone and Lopressor.  She remained in the ICU for diuresis and close monitoring, and was able to be transferred to stepdown on postop day 7.  At that time, she was off all pressors, and tubes and lines had been removed in the standard fashion.  Her postop course was complicated by an enlarging right pleural effusion.  She underwent ultrasound guided thoracentesis on 11/14/2012, yielding 250 ml of bloody fluid.  Her followup chest x-rays have shown a persistent loculated effusion with atelectasis, and her oxygen sats are stable.  She also developed a Klebsiella urinary tract infection and was started on IV antibiotics.  Echocardiography was obtained and was concerned for a fluid collection around the patient's aorta.  She was taken back to the operating room on 11/22/2012 and underwent a Subxiphoid Pericardial Exposure for evacuation of Hematoma.  The patient tolerated the procedure without difficulty.    Overall, she is making slow but steady progress.  She will need to restart Coumadin at 2mg  daily tomorrow 11/27/2012 and fax the results to Dr. Rennis Golden. She continues on po Lasix and  is diuresing well.  She has been working with physical therapy for deconditioning. She has been switched from IV to po antibiotics.  She is maintaining sinus rhythm and blood pressures are stable. She is medically stable at this time and we will plan for discharge home with home health today.    Recent vital signs:  Filed Vitals:   11/18/12 0510  BP: 107/55  Pulse: 77  Temp: 97.9 F (36.6 C)  Resp: 19    Recent laboratory studies:  CBC:No results found for this basename: WBC, HGB, HCT, PLT,  in the last 72 hours BMET: No results found for this basename: NA, K, CL, CO2, GLUCOSE, BUN, CREATININE, CALCIUM,  in the last 72 hours  PT/INR:  Recent Labs  11/18/12 0400  LABPROT 16.9*  INR 1.41     Discharge Medications:     Medication List    STOP taking these medications       acetaminophen 500 MG tablet  Commonly known as:  TYLENOL     amLODipine 5 MG tablet  Commonly known as:  NORVASC     isosorbide mononitrate 30 MG 24 hr tablet  Commonly known as:  IMDUR     nitroGLYCERIN 0.4 MG SL tablet  Commonly known as:  NITROSTAT      TAKE these medications       amiodarone 200 MG tablet  Commonly known as:  PACERONE  Take 1 tablet (200 mg total) by mouth 2 (two) times daily.     aspirin 81 MG chewable tablet  Chew 81 mg by mouth daily.     cephALEXin 500 MG capsule  Commonly known as:  KEFLEX  Take 1 capsule (500 mg total) by mouth 2 (two) times daily.     cholecalciferol 1000 UNITS tablet  Commonly known as:  VITAMIN D  Take 1,000 Units by mouth daily.     estrogen (conjugated)-medroxyprogesterone 0.3-1.5 MG per tablet  Commonly known as:  PREMPRO  Take 1 tablet by mouth daily.     ferrous fumarate-b12-vitamic C-folic acid capsule  Commonly known as:  TRINSICON / FOLTRIN  Take 1 capsule by mouth 3 (three) times daily after meals.     Fish Oil 1200 MG Caps  Take 1,200 mg by mouth daily.     furosemide 40 MG tablet  Commonly known as:  LASIX  Take 1  tablet (40 mg total) by mouth daily.     metoprolol tartrate 12.5 mg Tabs  Commonly known as:  LOPRESSOR  Take 0.5 tablets (12.5 mg total) by mouth 2 (two) times daily.     naphazoline-pheniramine 0.025-0.3 % ophthalmic solution  Commonly known as:  NAPHCON-A  Place 1 drop into both eyes 2 (two) times daily as needed. For redness/dry eyes     omeprazole 20 MG capsule  Commonly known as:  PRILOSEC  Take 20 mg by mouth daily.     oxyCODONE 5 MG immediate release tablet  Commonly known as:  Oxy IR/ROXICODONE  Take 1-2 tablets (5-10 mg total) by mouth every  3 (three) hours as needed.     potassium chloride SA 20 MEQ tablet  Commonly known as:  K-DUR,KLOR-CON  Take 2 tablets (40 mEq total) by mouth daily.     pravastatin 80 MG tablet  Commonly known as:  PRAVACHOL  Take 80 mg by mouth daily.     vitamin C 1000 MG tablet  Take 1,000 mg by mouth daily.     warfarin 2 MG tablet  Commonly known as:  COUMADIN  Take 1 tablet (2 mg total) by mouth daily.         Discharge Instructions:  The patient is to refrain from driving, heavy lifting or strenuous activity.  May shower daily and clean incisions with soap and water.  May resume regular diet.   Follow Up:      Discharge Orders   Future Appointments Provider Department Dept Phone   12/04/2012 4:00 PM Kerin Perna, MD [facility] [phone]   Future Orders Complete By Expires     Amb Referral to Cardiac Rehabilitation  As directed        Follow-up Information   Follow up with VAN Dinah Beers, MD. (March 5,2014 at 4:00 pm, please obtain a chest xray at [facility] at 3:00pm. It is located in the same office complex as the surgeon)    Contact information:   [address] [employer] [address] [place] [address] [phone]       Follow up with Chrystie Nose, MD. (see your cardiologist in 2 weeks, also arrange to have blood test for coumadin dosing in 2-3 days after  discharge)    Contact information:   [address] [place] [address] [phone]   Nursing facility to draw PT and INR on Thursday on 11/21/2012. Please fax or call results to Dr.Hilty     COLLINS,GINA H 11/18/2012, 9:02 AM    patient examined and medical record reviewed,agree with above note. VAN TRIGT III,Webster Patrone 10/30/2014

## 2012-11-18 NOTE — Progress Notes (Signed)
Chaplain visited patient during morning rounds. Pt was alert, responsive and recovering from heart surgery. Pt expressed hope and improvement in her recovery. Chaplain listened empathically, shared words of encouragement, provided ministry of presence and prayed with pt. Chaplain will follow-up as needed. Pt thanked Chaplain for the visit, presence and prayer. Kelle Darting [phone]

## 2012-11-18 NOTE — Progress Notes (Addendum)
13 Days Post-Op Procedure(s) (LRB): CORONARY ARTERY BYPASS GRAFTING (CABG) (N/A) REPLACEMENT ASCENDING AORTA (N/A) REDO STERNOTOMY (N/A) INTRAOPERATIVE TRANSESOPHAGEAL ECHOCARDIOGRAM (N/A) ENDOVEIN HARVEST OF GREATER SAPHENOUS VEIN (Right) Subjective:  Ms. Patient is without complaints this morning  Objective: Vital signs in last 24 hours: Temp:  [97.9 F (36.6 C)-98.2 F (36.8 C)] 97.9 F (36.6 C) (02/17 0510) Pulse Rate:  [77-89] 77 (02/17 0510) Cardiac Rhythm:  [-] Normal sinus rhythm (02/16 2000) Resp:  [18-19] 19 (02/17 0510) BP: (107-115)/(49-55) 107/55 mmHg (02/17 0510) SpO2:  [96 %-100 %] 98 % (02/17 0510) Weight:  [171 lb 12.8 oz (77.928 kg)] 171 lb 12.8 oz (77.928 kg) (02/17 0510)  Intake/Output from previous day: 02/16 0701 - 02/17 0700 In: 770 [P.O.:720; IV Piggyback:50] Out: 250 [Urine:250]  General appearance: alert, cooperative and no distress Heart: regular rate and rhythm Lungs: diminished breath sounds right base Abdomen: soft, non-tender; bowel sounds normal; no masses,  no organomegaly Extremities: edema trace Wound: clean and dry  Lab Results: No results found for this basename: WBC, HGB, HCT, PLT,  in the last 72 hours BMET: No results found for this basename: NA, K, CL, CO2, GLUCOSE, BUN, CREATININE, CALCIUM,  in the last 72 hours  PT/INR:  Recent Labs  11/18/12 0400  LABPROT 16.9*  INR 1.41   ABG    Component Value Date/Time   PHART 7.385 11/06/2012 1111   HCO3 26.0* 11/06/2012 1111   TCO2 26 11/06/2012 1619   ACIDBASEDEF 1.0 11/05/2012 1921   O2SAT 69.2 11/07/2012 1019   CBG (last 3)  No results found for this basename: GLUCAP,  in the last 72 hours  Assessment/Plan: S/P Procedure(s) (LRB): CORONARY ARTERY BYPASS GRAFTING (CABG) (N/A) REPLACEMENT ASCENDING AORTA (N/A) REDO STERNOTOMY (N/A) INTRAOPERATIVE TRANSESOPHAGEAL ECHOCARDIOGRAM (N/A) ENDOVEIN HARVEST OF GREATER SAPHENOUS VEIN (Right)  1. INR 1.41- will give Coumadin 5mg  tonight 2.  Right sided pleural effusion- appears moderate on CXR, patient off oxygen, asymptomatic 3. CV- NSR, continue Amiodarone, Lorpessor 4. Deconditioning- PT/OT 5. Weight is at baseline, on Lasix 6. Dispo- patient is stable, ready for SNF 24-48 hours?  LOS: 14 days    Lowella Dandy 11/18/2012  Feels better today inr 1.4 I have seen and examined Lucina Mellow and agree with the above assessment  and plan.  Delight Ovens MD Beeper [phone] Office [phone] 11/18/2012 11:04 AM

## 2012-11-18 NOTE — Progress Notes (Signed)
Pt. Seen and examined. Agree with the NP/PA-C note as written.  Doing well, free of angina. Ambulating. INR is coming up.  Follow-up with me or a mid-level provider in our office in 7-10 days.  Chrystie Nose, MD, [facility] Attending Cardiologist [facility]

## 2012-11-18 NOTE — Progress Notes (Signed)
[facility]  Subjective: No complaints. Feeling better.   Objective: Vital signs in last 24 hours: Temp:  [97.9 F (36.6 C)-98.2 F (36.8 C)] 97.9 F (36.6 C) (02/17 0510) Pulse Rate:  [77-89] 80 (02/17 0923) Resp:  [18-19] 19 (02/17 0510) BP: (107-134)/(49-55) 134/54 mmHg (02/17 0923) SpO2:  [96 %-100 %] 98 % (02/17 0510) Weight:  [171 lb 12.8 oz (77.928 kg)] 171 lb 12.8 oz (77.928 kg) (02/17 0510) Last BM Date: 11/17/12  Intake/Output from previous day: 02/16 0701 - 02/17 0700 In: 770 [P.O.:720; IV Piggyback:50] Out: 250 [Urine:250] Intake/Output this shift:    Medications Current Facility-Administered Medications  Medication Dose Route Frequency Provider Last Rate Last Dose  . 0.9 %  sodium chloride infusion  250 mL Intravenous PRN Kerin Perna, MD      . acetaminophen (TYLENOL) tablet 650 mg  650 mg Oral Q6H PRN Kerin Perna, MD   650 mg at 11/18/12 8295  . amiodarone (PACERONE) tablet 200 mg  200 mg Oral BID Kerin Perna, MD   200 mg at 11/18/12 6213  . aspirin EC tablet 81 mg  81 mg Oral Daily Kerin Perna, MD   81 mg at 11/18/12 0865  . atorvastatin (LIPITOR) tablet 40 mg  40 mg Oral q1800 Rowe Clack, PA   40 mg at 11/17/12 1752  . bisacodyl (DULCOLAX) EC tablet 10 mg  10 mg Oral Daily Rowe Clack, PA   10 mg at 11/16/12 1031   Or  . bisacodyl (DULCOLAX) suppository 10 mg  10 mg Rectal Daily Rowe Clack, PA      . bisacodyl (DULCOLAX) EC tablet 10 mg  10 mg Oral Daily PRN Kerin Perna, MD   10 mg at 11/16/12 2212   Or  . bisacodyl (DULCOLAX) suppository 10 mg  10 mg Rectal Daily PRN Kerin Perna, MD      . ceFEPIme (MAXIPIME) 1 g in dextrose 5 % 50 mL IVPB  1 g Intravenous Q12H Kerin Perna, MD   1 g at 11/18/12 0443  . docusate sodium (COLACE) capsule 200 mg  200 mg Oral Daily Rowe Clack, PA   200 mg at 11/18/12 7846  . ferrous fumarate-b12-vitamic C-folic acid (TRINSICON / FOLTRIN) capsule 1 capsule  1  capsule Oral TID PC Kerin Perna, MD   1 capsule at 11/18/12 0939  . furosemide (LASIX) tablet 40 mg  40 mg Oral Daily Rowe Clack, PA   40 mg at 11/18/12 9629  . guaiFENesin (MUCINEX) 12 hr tablet 600 mg  600 mg Oral Q12H PRN Kerin Perna, MD      . labetalol (NORMODYNE,TRANDATE) injection 10 mg  10 mg Intravenous Q2H PRN Purcell Nails, MD      . magnesium hydroxide (MILK OF MAGNESIA) suspension 30 mL  30 mL Oral Daily PRN Kerin Perna, MD      . metoprolol (LOPRESSOR) injection 2.5-5 mg  2.5-5 mg Intravenous Q2H PRN Rowe Clack, PA      . metoprolol tartrate (LOPRESSOR) tablet 12.5 mg  12.5 mg Oral BID Kerin Perna, MD   12.5 mg at 11/18/12 0939  . naphazoline-pheniramine (NAPHCON-A) 0.025-0.3 % ophthalmic solution 1 drop  1 drop Both Eyes QID PRN Rowe Clack, PA      . ondansetron [facility]) injection 4 mg  4 mg Intravenous Q6H PRN Rowe Clack, PA   4 mg at 11/10/12 1127  .  oxyCODONE (Oxy IR/ROXICODONE) immediate release tablet 5-10 mg  5-10 mg Oral Q3H PRN Kerin Perna, MD   10 mg at 11/17/12 2140  . pantoprazole (PROTONIX) EC tablet 40 mg  40 mg Oral Daily Rowe Clack, PA   40 mg at 11/18/12 0943  . potassium chloride SA (K-DUR,KLOR-CON) CR tablet 40 mEq  40 mEq Oral Daily Rowe Clack, PA   40 mEq at 11/18/12 4098  . sodium chloride 0.9 % injection 10-40 mL  10-40 mL Intracatheter Q12H Kerin Perna, MD   10 mL at 11/18/12 0546  . sodium chloride 0.9 % injection 3 mL  3 mL Intravenous Q12H Rowe Clack, PA   3 mL at 11/15/12 1212  . sodium chloride 0.9 % injection 3 mL  3 mL Intravenous Q12H Kerin Perna, MD   3 mL at 11/15/12 1211  . sodium chloride 0.9 % injection 3 mL  3 mL Intravenous PRN Kerin Perna, MD      . traMADol [name]) tablet 50 mg  50 mg Oral Q6H PRN Kerin Perna, MD   50 mg at 11/17/12 1752  . Warfarin - Physician Dosing Inpatient   Does not apply q1800 Kerin Perna, MD        PE: General appearance: alert, cooperative and no  distress Lungs: clear on the left. Decreased BS on the RLL. Heart: regular rate and rhythm Extremities: trace bilateral edema Pulses: 2+ and symmetric Skin: warm and dry Neurologic: Grossly normal  PT/INR  Recent Labs  11/16/12 0445 11/17/12 0654 11/18/12 0400  LABPROT 19.6* 17.1* 16.9*  INR 1.72* 1.43 1.41    Assessment/Plan  Principal Problem:   Unstable angina Active Problems:   CAD-severe LM disease- S/P CABG X 2 (SVG-OM, SVG-LAD) 11/05/12   S/P AVR, St Jude 2000   Dilated ascending aorta at cath 11/03/12 - S/P root replacement 11/05/12   Chronic anticoagulation   Contrast media allergy   HTN (hypertension)   Sarcoidosis  Plan:  S/P CABG X 2 and Ascending Aorta replacement. POD#13. NSR on telemetry. No red alarms. HR and BP both stable. INR still sub therapeutic at 1.72. CT surgery is managing her Coumadin.  She is in phase I of cardiac rehab. She is progressing well and ambulating w/o difficulty. Per CT surgery note from today, she is approaching d/c. She will likely need SNF placement for rehab. We will arrange F[facility] at [facility]. Can continue to follow as needed. She is already on a BB, ASA and statin. MD to follow with further recommendation.   LOS: 14 days    Marque Bango M. Delmer Islam 11/18/2012 10:33 AM

## 2012-11-18 NOTE — Clinical Social Work Note (Signed)
CSW followed up with Pt regarding SNF bed offers. Pt is reluctant to dc plan but verbalizes understanding of being alone at this time and agrees to the benefits of rehab. CSW arranged for liaison to visit with Pt to answer questions about facility. SNF bed is ready. CSW will f/u to facilitate dc once Pt is medically cleared for dc.   Frederico Hamman, LCSW [phone]

## 2012-11-18 NOTE — Progress Notes (Signed)
CARDIAC REHAB PHASE I   PRE:  Rate/Rhythm: 100SR  BP:  Supine:   Sitting: 150/60  Standing:    SaO2: 98%RA  MODE:  Ambulation: 550 ft   POST:  Rate/Rhythem: 104 ST  BP:  Supine:   Sitting: 130/64  Standing:    SaO2: 95-100%RA 2130-8657 Pt walked after doing her bath. Tolerated well. Walked 550 ft on RA with rolling walker and minimal asst. Did not have to rest. Gait steadier and faster. Stated feeling stronger. To recliner after walk.  Duanne Limerick

## 2012-11-18 NOTE — Progress Notes (Signed)
Physical Therapy Treatment Patient Details Name: [name] MRN: [id number] DOB: September 08, 1951 Today's Date: 11/18/2012 Time: 7829-5621 PT Time Calculation (min): 24 min  PT Assessment / Plan / Recommendation Comments on Treatment Session  Pt s/p redo CABG and TAA repair.  Pt progressing with mobility.  Pt able to ambulate further and sats now above 92% at all times.  Needs less cues for sternal precautions.  Tried ambulating without RW and pt not comfortable.  Pt needs to continue to work on balance with PT.      Follow Up Recommendations  SNF;Supervision/Assistance - 24 hour                 Equipment Recommendations  None recommended by PT        Frequency Min 3X/week   Plan Discharge plan remains appropriate;Frequency remains appropriate    Precautions / Restrictions Precautions Precautions: Fall;Sternal Restrictions Weight Bearing Restrictions: No   Pertinent Vitals/Pain VSS, No pain    Mobility  Bed Mobility Bed Mobility: Not assessed Transfers Transfers: Sit to Stand;Stand to Sit Sit to Stand: 5: Supervision;Without upper extremity assist;From chair/3-in-1 Stand to Sit: 5: Supervision;Without upper extremity assist;To chair/3-in-1 Details for Transfer Assistance: no cuing needed today.   Ambulation/Gait Ambulation/Gait Assistance: 4: Min guard Ambulation Distance (Feet): 400 Feet Assistive device: Rolling walker Ambulation/Gait Assistance Details: occasional cuing for posture.  Pt took 3 standing rest breaks to catch breath.   Gait Pattern: Step-through pattern;Decreased stride length Gait velocity: decreased Stairs: No Wheelchair Mobility Wheelchair Mobility: No    PT Goals Acute Rehab PT Goals Time For Goal Achievement: 11/25/12 Pt will go Supine/Side to Sit: Independently PT Goal: Supine/Side to Sit - Progress: Revised due to lack of progress Pt will go Sit to Stand: Independently PT Goal: Sit to Stand - Progress: Revised due to lack of  progress Pt will Ambulate: >150 feet;with modified independence;with least restrictive assistive device PT Goal: Ambulate - Progress: Revised due to lack of progress Additional Goals PT Goal: Additional Goal #1 - Progress: Met Additional Goal #2: Pt to score >19/24 on DGI to demonstrate improved balance.   PT Goal: Additional Goal #2 - Progress: Goal set today  Visit Information  Last PT Received On: 11/18/12 Assistance Needed: +1    Subjective Data  Subjective: "I still need to do therapy.  I can't go stay by myself yet."   Cognition  Cognition Overall Cognitive Status: Appears within functional limits for tasks assessed/performed Arousal/Alertness: Awake/alert Orientation Level: Appears intact for tasks assessed Behavior During Session: [facility] for tasks performed    Balance  Static Standing Balance Static Standing - Balance Support: During functional activity;Left upper extremity supported Static Standing - Level of Assistance: 6: Modified independent (Device/Increase time) Static Standing - Comment/# of Minutes: 3 minutes with RW to wash hands at sink. Standardized Balance Assessment Standardized Balance Assessment: Dynamic Gait Index Dynamic Gait Index Level Surface: Normal Change in Gait Speed: Mild Impairment Gait with Horizontal Head Turns: Mild Impairment Gait with Vertical Head Turns: Normal Gait and Pivot Turn: Mild Impairment Step Over Obstacle: Mild Impairment Step Around Obstacles: Mild Impairment Steps: Moderate Impairment Total Score: 17 High Level Balance High Level Balance Comments: 17/24 on DGI suggesting at risk of falls without device.    End of Session PT - End of Session Equipment Utilized During Treatment: Gait belt Activity Tolerance: Patient tolerated treatment well Patient left: in chair;with call bell/phone within reach Nurse Communication: Mobility status        INGOLD,Makaia Rappa 11/18/2012, 4:13  PM [facility] Acute  Rehabilitation [phone] (pager)

## 2012-11-19 LAB — PROTIME-INR: Prothrombin Time: 21.3 seconds — ABNORMAL HIGH (ref 11.6–15.2)

## 2012-11-19 MED ORDER — WARFARIN SODIUM 5 MG PO TABS: 5.0000 mg | ORAL_TABLET | Freq: Every day | ORAL | Status: DC

## 2012-11-19 MED ORDER — CEPHALEXIN 500 MG PO CAPS: 500.0000 mg | ORAL_CAPSULE | Freq: Two times a day (BID) | ORAL | Status: DC

## 2012-11-19 MED ORDER — ALTEPLASE 2 MG IJ SOLR
2.0000 mg | Freq: Once | INTRAMUSCULAR | Status: AC
  Administered 2012-11-19: 2 mg
  Filled 2012-11-19: qty 2

## 2012-11-19 NOTE — Progress Notes (Signed)
Chest tube sutures removed as ordered and steri strips applied. Pt tolerated well will continue to monitor patient. Donavin Audino, Randall An RN

## 2012-11-19 NOTE — Progress Notes (Signed)
14 Days Post-Op Procedure(s) (LRB): CORONARY ARTERY BYPASS GRAFTING (CABG) (N/A) REPLACEMENT ASCENDING AORTA (N/A) REDO STERNOTOMY (N/A) INTRAOPERATIVE TRANSESOPHAGEAL ECHOCARDIOGRAM (N/A) ENDOVEIN HARVEST OF GREATER SAPHENOUS VEIN (Right) Subjective:  Ms. Gora has no complaints this morning.  She is ready for SNF placement.  Objective: Vital signs in last 24 hours: Temp:  [98.1 F (36.7 C)-98.4 F (36.9 C)] 98.1 F (36.7 C) (02/18 0500) Pulse Rate:  [77-90] 79 (02/18 0500) Cardiac Rhythm:  [-] Normal sinus rhythm (02/18 0745) Resp:  [18] 18 (02/18 0500) BP: (98-134)/(52-55) 98/55 mmHg (02/18 0500) SpO2:  [92 %-100 %] 100 % (02/18 0500) Weight:  [171 lb 15.3 oz (78 kg)] 171 lb 15.3 oz (78 kg) (02/18 0500)  Intake/Output from previous day: 02/17 0701 - 02/18 0700 In: 480 [P.O.:480] Out: 1050 [Urine:1050]  General appearance: alert, cooperative and no distress Heart: regular rate and rhythm Lungs: clear to auscultation bilaterally Abdomen: soft, non-tender; bowel sounds normal; no masses,  no organomegaly Wound: clean and dry  Lab Results: No results found for this basename: WBC, HGB, HCT, PLT,  in the last 72 hours BMET: No results found for this basename: NA, K, CL, CO2, GLUCOSE, BUN, CREATININE, CALCIUM,  in the last 72 hours  PT/INR:  Recent Labs  11/18/12 0400  LABPROT 16.9*  INR 1.41   ABG    Component Value Date/Time   PHART 7.385 11/06/2012 1111   HCO3 26.0* 11/06/2012 1111   TCO2 26 11/06/2012 1619   ACIDBASEDEF 1.0 11/05/2012 1921   O2SAT 69.2 11/07/2012 1019   CBG (last 3)  No results found for this basename: GLUCAP,  in the last 72 hours  Assessment/Plan: S/P Procedure(s) (LRB): CORONARY ARTERY BYPASS GRAFTING (CABG) (N/A) REPLACEMENT ASCENDING AORTA (N/A) REDO STERNOTOMY (N/A) INTRAOPERATIVE TRANSESOPHAGEAL ECHOCARDIOGRAM (N/A) ENDOVEIN HARVEST OF GREATER SAPHENOUS VEIN (Right)  1. CV- NSR, continue Amiodarone, Lopressor 2. Pulm- no acute issues,  right sided pleural effusion 3. INR- pending this morning, will continue 5 mg of coumadin tonight 4. Deconditioning- PT/OT 5. Dispo- patient doing well, if INR is trending up we will plan for d/c to SNF today   LOS: 15 days    Raford Pitcher, Denny Peon 11/19/2012

## 2012-11-19 NOTE — Progress Notes (Signed)
CARDIAC REHAB PHASE I   PRE:  Rate/Rhythm: 92 SR    BP: sitting 120/51    SaO2: 93 RA  MODE:  Ambulation: 550 ft   POST:  Rate/Rhythm: 108 ST    BP: sitting 129/52     SaO2: 99-100 RA  Tolerated well using RW. Sts RW gives her security. Reviewed ed. Pt anxious for d/c. Declined watching video, sts she prefers to read. [phone]  Harriet Masson CES, ACSM

## 2012-11-19 NOTE — Clinical Social Work Note (Signed)
Insurance agency and SNF did not settle negotiated rate before 5pm. CSW will f/u with SNF for likely dc on Wed if still medically ready.   Frederico Hamman, LCSW [phone]

## 2012-11-19 NOTE — Progress Notes (Signed)
Physical Therapy Treatment Patient Details Name: [name] MRN: [id number] DOB: Jun 04, 1951 Today's Date: 11/19/2012 Time: 4098-1191 PT Time Calculation (min): 25 min  PT Assessment / Plan / Recommendation Comments on Treatment Session  Pt with improved balance and mobility, required only supervision level assistance with no LOB today with dynamic gait challenges with RW.  HR 99 bpm with activity, returned to high 80's with rest.  Pt reports she feels she needs to improve her balance before d/c home.    Follow Up Recommendations  SNF;Supervision/Assistance - 24 hour                 Frequency Min 3X/week   Plan Discharge plan remains appropriate    Precautions / Restrictions Precautions Precautions: Fall;Sternal Restrictions Weight Bearing Restrictions: No   Pertinent Vitals/Pain No c/o pain.  HR 99 bpm with activity, returned to high 80's with seated rest.    Mobility  Transfers Sit to Stand: 5: Supervision Stand to Sit: 5: Supervision Details for Transfer Assistance: able to follow sternal precautions without cues Ambulation/Gait Ambulation Distance (Feet): 400 Feet Assistive device: Rolling walker Ambulation/Gait Assistance Details: dynamic gait training with head turns, stop/start and backward walking without LOB.  Pt with slow cadence and 2 x standing rest breaks.    Exercises Other Exercises Other Exercises: [place] HEP initiated level "A" for improved LE strength and balance.    PT Goals Acute Rehab PT Goals PT Goal: Sit to Stand - Progress: Progressing toward goal PT Goal: Ambulate - Progress: Progressing toward goal Additional Goals PT Goal: Additional Goal #1 - Progress: Met  Visit Information  Last PT Received On: 11/19/12 Assistance Needed: +1    Subjective Data  Subjective: I need work on my balance Patient Stated Goal: go home   Cognition  Cognition Overall Cognitive Status: Appears within functional limits for tasks  assessed/performed Orientation Level: Appears intact for tasks assessed Behavior During Session: [facility] for tasks performed    Balance  High Level Balance High Level Balance Activites: Side stepping;Sudden stops;Head turns;Direction changes High Level Balance Comments: with RW without LOB with supervision  End of Session PT - End of Session Activity Tolerance: Patient tolerated treatment well Patient left: in chair;with call bell/phone within reach   GP     [facility] 11/19/2012, 10:42 AM

## 2012-11-19 NOTE — Progress Notes (Signed)
Pt agreeable to ambulate, ambulated 150 feet x1 assist with walker. Pt tolerated well. Will continue to monitor patient. Cortny Bambach, Randall An RN

## 2012-11-19 NOTE — Clinical Social Work Placement (Signed)
Clinical Social Work Department CLINICAL SOCIAL WORK PLACEMENT NOTE 11/19/2012  Patient:  [name], [name]  Account Number:  [id number] Admit date:  11/04/2012  Clinical Social Worker:  Dellie Burns, Theresia Majors  Date/time:  11/17/2012 01:00 PM  Clinical Social Work is seeking post-discharge placement for this patient at the following level of care:   SKILLED NURSING   (*CSW will update this form in Epic as items are completed)   11/18/2012  Patient/family provided with [facility] Department of Clinical Social Work's list of facilities offering this level of care within the geographic area requested by the patient (or if unable, by the patient's family).  11/18/2012  Patient/family informed of their freedom to choose among providers that offer the needed level of care, that participate in Medicare, Medicaid or managed care program needed by the patient, have an available bed and are willing to accept the patient.  11/18/2012  Patient/family informed of [facility]' ownership interest in [facility], as well as of the fact that they are under no obligation to receive care at this facility.  PASARR submitted to EDS on 11/17/2012 PASARR number received from EDS on 11/17/2012  FL2 transmitted to all facilities in geographic area requested by pt/family on  11/17/2012 FL2 transmitted to all facilities within larger geographic area on   Patient informed that his/her managed care company has contracts with or will negotiate with  certain facilities, including the following:     Patient/family informed of bed offers received:  11/18/2012 Patient chooses bed at [facility] Physician recommends and patient chooses bed at    Patient to be transferred to [facility] on  11/19/2012 Patient to be transferred to facility by family  The following physician request were entered in Epic:   Additional Comments:  Frederico Hamman, LCSW [phone]

## 2012-11-19 NOTE — Progress Notes (Signed)
[facility] Progress Note  Subjective:  Feels well  Objective:   Vital Signs in the last 24 hours: Temp:  [97.4 F (36.3 C)-98.3 F (36.8 C)] 97.4 F (36.3 C) (02/18 1330) Pulse Rate:  [79-99] 94 (02/18 1330) Resp:  [18] 18 (02/18 1330) BP: (96-129)/(49-74) 107/63 mmHg (02/18 1330) SpO2:  [92 %-100 %] 96 % (02/18 1330) Weight:  [78 kg (171 lb 15.3 oz)] 78 kg (171 lb 15.3 oz) (02/18 0500)  Intake/Output from previous day: 02/17 0701 - 02/18 0700 In: 480 [P.O.:480] Out: 1050 [Urine:1050]  Scheduled: . amiodarone  200 mg Oral BID  . aspirin EC  81 mg Oral Daily  . atorvastatin  40 mg Oral q1800  . bisacodyl  10 mg Oral Daily   Or  . bisacodyl  10 mg Rectal Daily  . ceFEPime (MAXIPIME) IV  1 g Intravenous Q12H  . docusate sodium  200 mg Oral Daily  . ferrous fumarate-b12-vitamic C-folic acid  1 capsule Oral TID PC  . furosemide  40 mg Oral Daily  . metoprolol tartrate  12.5 mg Oral BID  . pantoprazole  40 mg Oral Daily  . potassium chloride  40 mEq Oral Daily  . sodium chloride  10-40 mL Intracatheter Q12H  . sodium chloride  3 mL Intravenous Q12H  . sodium chloride  3 mL Intravenous Q12H  . warfarin  5 mg Oral q1800  . Warfarin - Physician Dosing Inpatient   Does not apply q1800    Physical Exam:   General appearance: alert, cooperative and no distress Neck: no adenopathy, supple, symmetrical, trachea midline and thyroid not enlarged, symmetric, no tenderness/mass/nodules; no JVD Lungs:  Slightly decreased BS at bases Heart: regular rate and rhythm and 1-2 SEM c/w AVR Abdomen: soft, non-tender; bowel sounds normal; no masses,  no organomegaly Extremities: no edema, redness or tenderness in the calves or thighs   Rate: 80  Rhythm: normal sinus rhythm  Lab Results:   No results found for this basename: NA, K, CL, CO2, GLUCOSE, BUN, CREATININE,  in the last 72 hours No results found for this basename: TROPONINI, CK, MB,  in the last  72 hours Hepatic Function Panel No results found for this basename: PROT, ALBUMIN, AST, ALT, ALKPHOS, BILITOT, BILIDIR, IBILI,  in the last 72 hours  Recent Labs  11/19/12 0855  INR 1.93*    Lipid Panel  No results found for this basename: chol, trig, hdl, cholhdl, vldl, ldlcalc     Imaging:  Dg Chest 2 View  11/18/2012  *RADIOLOGY REPORT*  Clinical Data: Post CABG.  Right pleural effusion.  Thoracentesis.  CHEST - 2 VIEW  Comparison: 11/15/2012 and CT chest 11/04/2012.  Findings: Trachea is midline.  Heart is enlarged.  Sternotomy wires are unchanged in position.  Left PICC tip projects over the SVC.  A moderate right pleural effusion is seen with associated right basilar collapse/consolidation.  Findings are unchanged. Minimal left basilar airspace disease and small left pleural effusion persist.  No definite pneumothorax.  IMPRESSION:  1.  Moderate right pleural effusion with right basilar collapse/consolidation, stable.  No definite associated pneumothorax. 2.  Small left pleural effusion and minimal left basilar airspace disease.   Original Report Authenticated By: Leanna Battles, M.D.       Assessment/Plan:   Principal Problem:   Unstable angina Active Problems:   CAD-severe LM disease- S/P CABG X 2 (SVG-OM, SVG-LAD) 11/05/12   S/P AVR, St Jude 2000   Dilated ascending aorta  at cath 11/03/12 - S/P root replacement 11/05/12   Chronic anticoagulation   Contrast media allergy   HTN (hypertension)   Sarcoidosis  Maintaining NSR. INR mildly subtherapeutic at 1.93. Pleural effusions as noted above. For SNF post DC    Lennette Bihari, MD, [facility] 11/19/2012, 2:44 PM

## 2012-11-20 ENCOUNTER — Encounter (HOSPITAL_COMMUNITY): Payer: Self-pay | Admitting: Pharmacy Technician

## 2012-11-20 ENCOUNTER — Inpatient Hospital Stay (HOSPITAL_COMMUNITY): Payer: PRIVATE HEALTH INSURANCE

## 2012-11-20 HISTORY — PX: TRANSTHORACIC ECHOCARDIOGRAM: SHX275

## 2012-11-20 LAB — BASIC METABOLIC PANEL
BUN: 10 mg/dL (ref 6–23)
CO2: 28 mEq/L (ref 19–32)
Calcium: 9.2 mg/dL (ref 8.4–10.5)
Chloride: 104 mEq/L (ref 96–112)
Creatinine, Ser: 0.82 mg/dL (ref 0.50–1.10)
GFR calc Af Amer: 88 mL/min — ABNORMAL LOW (ref >90)
GFR calc non Af Amer: 76 mL/min — ABNORMAL LOW (ref >90)
Glucose, Bld: 136 mg/dL — ABNORMAL HIGH (ref 70–99)
Potassium: 4.2 mEq/L (ref 3.5–5.1)
Sodium: 140 mEq/L (ref 135–145)

## 2012-11-20 LAB — CBC
HCT: 26.9 % — ABNORMAL LOW (ref 36.0–46.0)
Hemoglobin: 8.8 g/dL — ABNORMAL LOW (ref 12.0–15.0)
MCH: 28.1 pg (ref 26.0–34.0)
MCHC: 32.7 g/dL (ref 30.0–36.0)
MCV: 85.9 fL (ref 78.0–100.0)
Platelets: 432 10*3/uL — ABNORMAL HIGH (ref 150–400)
RBC: 3.13 MIL/uL — ABNORMAL LOW (ref 3.87–5.11)
RDW: 17.8 % — ABNORMAL HIGH (ref 11.5–15.5)
WBC: 12.7 10*3/uL — ABNORMAL HIGH (ref 4.0–10.5)

## 2012-11-20 LAB — PROTIME-INR
INR: 2.38 — ABNORMAL HIGH (ref 0.00–1.49)
Prothrombin Time: 24.9 seconds — ABNORMAL HIGH (ref 11.6–15.2)

## 2012-11-20 MED ORDER — PREDNISONE 50 MG PO TABS
50.0000 mg | ORAL_TABLET | ORAL | Status: AC
  Administered 2012-11-20 – 2012-11-21 (×3): 50 mg via ORAL
  Filled 2012-11-20 (×3): qty 1

## 2012-11-20 MED ORDER — ALTEPLASE 2 MG IJ SOLR
2.0000 mg | Freq: Once | INTRAMUSCULAR | Status: AC
  Administered 2012-11-20: 2 mg
  Filled 2012-11-20: qty 2

## 2012-11-20 MED ORDER — WARFARIN SODIUM 2.5 MG PO TABS: 2.5000 mg | ORAL_TABLET | ORAL | Status: DC

## 2012-11-20 MED ORDER — WARFARIN SODIUM 2.5 MG PO TABS
2.5000 mg | ORAL_TABLET | Freq: Every day | ORAL | Status: DC
  Filled 2012-11-20: qty 1

## 2012-11-20 MED ORDER — VITAMIN K1 10 MG/ML IJ SOLN
1.0000 mg | Freq: Once | INTRAVENOUS | Status: AC
  Administered 2012-11-20: 1 mg via INTRAVENOUS
  Filled 2012-11-20: qty 0.1

## 2012-11-20 MED ORDER — DIPHENHYDRAMINE HCL 50 MG PO CAPS
50.0000 mg | ORAL_CAPSULE | Freq: Once | ORAL | Status: AC
  Administered 2012-11-21: 50 mg via ORAL
  Filled 2012-11-20: qty 1

## 2012-11-20 MED ORDER — WARFARIN SODIUM 5 MG PO TABS: 5.0000 mg | ORAL_TABLET | ORAL | Status: DC

## 2012-11-20 NOTE — Progress Notes (Addendum)
Pt. Seen and examined. I agree with her exam, but have the following changes to her assessment:  I was informed of the results of a CT scan that was apparently performed yesterday which shows a large volume para-aortic or intrapericardial fluid collection which has blood density on CT, overlying the right atrium.  She cannot be safely discharged until this is further investigated by CT surgery.  She is hemodynamically stable at this point without signs of any tamponade physiology.  I have reviewed her echocardiogram again from 11/11/12 - there was enhanced echogenicity inferiorly extending to the apex as mentioned.  In retrospect, this could have been a collection of blood.  It is impossible to determine if it was intrapericardial, however, there were no signs of tamponade physiology.  A trivial anterior pericardial effusion was noted - epicardial pacer wire splay was noted at that time as well, which is also bright and echodense.  Chrystie Nose, MD, [facility] Attending Cardiologist [facility]

## 2012-11-20 NOTE — Progress Notes (Addendum)
15 Days Post-Op Procedure(s) (LRB): CORONARY ARTERY BYPASS GRAFTING (CABG) (N/A) REPLACEMENT ASCENDING AORTA (N/A) REDO STERNOTOMY (N/A) INTRAOPERATIVE TRANSESOPHAGEAL ECHOCARDIOGRAM (N/A) ENDOVEIN HARVEST OF GREATER SAPHENOUS VEIN (Right) Subjective:  Ms. [name] has no complaints this morning. She is frustrated that she is still here.  She states that if a price can not be agreed on with her insurance company and SNF today she will be going home instead.   Objective: Vital signs in last 24 hours: Temp:  [97.4 F (36.3 C)-98.2 F (36.8 C)] 98.2 F (36.8 C) (02/19 0321) Pulse Rate:  [79-99] 86 (02/19 0321) Cardiac Rhythm:  [-] Normal sinus rhythm (02/18 1950) Resp:  [18] 18 (02/19 0321) BP: (96-129)/(49-74) 112/52 mmHg (02/19 0321) SpO2:  [92 %-99 %] 99 % (02/19 0321) Weight:  [165 lb 5.5 oz (75 kg)] 165 lb 5.5 oz (75 kg) (02/19 0321)  Intake/Output from previous day: 02/18 0701 - 02/19 0700 In: 240 [P.O.:240] Out: 1500 [Urine:1500] Intake/Output this shift: Total I/O In: 240 [P.O.:240] Out: 451 [Urine:450; Stool:1]  General appearance: alert, cooperative and no distress Neurologic: intact Heart: regular rate and rhythm Lungs: clear to auscultation bilaterally Abdomen: soft, non-tender; bowel sounds normal; no masses,  no organomegaly Extremities: edema trace Wound: clean and dry  Lab Results: No results found for this basename: WBC, HGB, HCT, PLT,  in the last 72 hours BMET: No results found for this basename: NA, K, CL, CO2, GLUCOSE, BUN, CREATININE, CALCIUM,  in the last 72 hours  PT/INR:  Recent Labs  11/20/12 0540  LABPROT 24.9*  INR 2.38*   ABG    Component Value Date/Time   PHART 7.385 11/06/2012 1111   HCO3 26.0* 11/06/2012 1111   TCO2 26 11/06/2012 1619   ACIDBASEDEF 1.0 11/05/2012 1921   O2SAT 69.2 11/07/2012 1019   CBG (last 3)  No results found for this basename: GLUCAP,  in the last 72 hours  Assessment/Plan: S/P Procedure(s) (LRB): CORONARY ARTERY  BYPASS GRAFTING (CABG) (N/A) REPLACEMENT ASCENDING AORTA (N/A) REDO STERNOTOMY (N/A) INTRAOPERATIVE TRANSESOPHAGEAL ECHOCARDIOGRAM (N/A) ENDOVEIN HARVEST OF GREATER SAPHENOUS VEIN (Right)  1. CV- NSR on Amiodarone, Lopressor 2. Pulm- no issues 3. INR 2.38 - will give 2.5mg  tonight 4. Deconditioning- PT/OT 5. Dispo- hopefully to SNF today, pending price agreement with insurance    LOS: 16 days    [name] 11/20/2012  Patient has home care with family in place. CT chest shows only small pleural effusions but a new R sided pericardial effusion which probably will need drainage- 2D echo pending

## 2012-11-20 NOTE — Progress Notes (Signed)
  Echocardiogram 2D Echocardiogram has been performed.  [name] 11/20/2012, 4:40 PM

## 2012-11-20 NOTE — Progress Notes (Signed)
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 Patient is hemodynamically stable and echo does not appear to suggest Not although a full report is pending. We'll proceed with a CT chest with IV contrast to assess possible aortic leak and then assess patient to see if a drainage procedure would be indicated. Patient is IV contrast allergic and we will prepare with oral prednisone protocol and plan CT with IV contrast in a.m.   Objective: Vital signs in last 24 hours: Temp:  [98.1 F (36.7 C)-98.6 F (37 C)] 98.6 F (37 C) (02/19 1420) Pulse Rate:  [79-94] 79 (02/19 1420) Cardiac Rhythm:  [-] Normal sinus rhythm (02/19 1345) Resp:  [18-22] 20 (02/19 1420) BP: (96-122)/(40-58) 96/48 mmHg (02/19 1420) SpO2:  [92 %-99 %] 97 % (02/19 1420) Weight:  [165 lb 5.5 oz (75 kg)] 165 lb 5.5 oz (75 kg) (02/19 0321)  Hemodynamic parameters for last 24 hours:    Intake/Output from previous day: 02/18 0701 - 02/19 0700 In: 240 [P.O.:240] Out: 1500 [Urine:1500] Intake/Output this shift: Total I/O In: 720 [P.O.:720] Out: 451 [Urine:450; Stool:1]  Stable  Lab Results:  Recent Labs  11/20/12 1240  WBC 12.7*  HGB 8.8*  HCT 26.9*  PLT 432*   BMET:  Recent Labs  11/20/12 1240  NA 140  K 4.2  CL 104  CO2 28  GLUCOSE 136*  BUN 10  CREATININE 0.82  CALCIUM 9.2    PT/INR:  Recent Labs  11/20/12 0540  LABPROT 24.9*  INR 2.38*   ABG    Component Value Date/Time   PHART 7.385 11/06/2012 1111   HCO3 26.0* 11/06/2012 1111   TCO2 26 11/06/2012 1619   ACIDBASEDEF 1.0 11/05/2012 1921   O2SAT 69.2 11/07/2012  1019   CBG (last 3)  No results found for this basename: GLUCAP,  in the last 72 hours  Assessment/Plan: S/P Procedure(s) (LRB): CORONARY ARTERY BYPASS GRAFTING (CABG) (N/A) REPLACEMENT ASCENDING AORTA (N/A) REDO STERNOTOMY (N/A) INTRAOPERATIVE TRANSESOPHAGEAL ECHOCARDIOGRAM (N/A) ENDOVEIN HARVEST OF GREATER SAPHENOUS VEIN (Right) Hold Coumadin CTA chest in a.m. possible subxiphoid window pending results of study   LOS: 16 days    VAN TRIGT III,PETER 11/20/2012

## 2012-11-20 NOTE — Progress Notes (Signed)
[facility]  Subjective: No complaints. Ready to go home.  Objective: Vital signs in last 24 hours: Temp:  [97.4 F (36.3 C)-98.2 F (36.8 C)] 98.2 F (36.8 C) (02/19 0321) Pulse Rate:  [79-99] 86 (02/19 0321) Resp:  [18] 18 (02/19 0321) BP: (96-129)/(49-74) 112/52 mmHg (02/19 0321) SpO2:  [92 %-99 %] 99 % (02/19 0321) Weight:  [165 lb 5.5 oz (75 kg)] 165 lb 5.5 oz (75 kg) (02/19 0321) Last BM Date: 11/17/12  Intake/Output from previous day: 02/18 0701 - 02/19 0700 In: 240 [P.O.:240] Out: 1500 [Urine:1500] Intake/Output this shift: Total I/O In: 240 [P.O.:240] Out: 451 [Urine:450; Stool:1]  Medications Current Facility-Administered Medications  Medication Dose Route Frequency Provider Last Rate Last Dose  . 0.9 %  sodium chloride infusion  250 mL Intravenous PRN Kerin Perna, MD      . acetaminophen (TYLENOL) tablet 650 mg  650 mg Oral Q6H PRN Kerin Perna, MD   650 mg at 11/19/12 0502  . amiodarone (PACERONE) tablet 200 mg  200 mg Oral BID Kerin Perna, MD   200 mg at 11/19/12 2216  . aspirin EC tablet 81 mg  81 mg Oral Daily Kerin Perna, MD   81 mg at 11/19/12 0947  . atorvastatin (LIPITOR) tablet 40 mg  40 mg Oral q1800 Rowe Clack, PA   40 mg at 11/19/12 1721  . bisacodyl (DULCOLAX) EC tablet 10 mg  10 mg Oral Daily Rowe Clack, [place]   10 mg at 11/19/12 4098   Or  . bisacodyl (DULCOLAX) suppository 10 mg  10 mg Rectal Daily Rowe Clack, PA      . bisacodyl (DULCOLAX) EC tablet 10 mg  10 mg Oral Daily PRN Kerin Perna, MD   10 mg at 11/16/12 2212   Or  . bisacodyl (DULCOLAX) suppository 10 mg  10 mg Rectal Daily PRN Kerin Perna, MD      . ceFEPIme (MAXIPIME) 1 g in dextrose 5 % 50 mL IVPB  1 g Intravenous Q12H Kerin Perna, MD   1 g at 11/20/12 0349  . docusate sodium (COLACE) capsule 200 mg  200 mg Oral Daily Rowe Clack, PA   200 mg at 11/19/12 0947  . ferrous fumarate-b12-vitamic C-folic acid (TRINSICON /  FOLTRIN) capsule 1 capsule  1 capsule Oral TID PC Kerin Perna, MD   1 capsule at 11/19/12 1721  . furosemide (LASIX) tablet 40 mg  40 mg Oral Daily Rowe Clack, PA   40 mg at 11/19/12 0947  . guaiFENesin (MUCINEX) 12 hr tablet 600 mg  600 mg Oral Q12H PRN Kerin Perna, MD      . labetalol (NORMODYNE,TRANDATE) injection 10 mg  10 mg Intravenous Q2H PRN Purcell Nails, MD      . magnesium hydroxide (MILK OF MAGNESIA) suspension 30 mL  30 mL Oral Daily PRN Kerin Perna, MD      . metoprolol (LOPRESSOR) injection 2.5-5 mg  2.5-5 mg Intravenous Q2H PRN Rowe Clack, PA      . metoprolol tartrate (LOPRESSOR) tablet 12.5 mg  12.5 mg Oral BID Kerin Perna, MD   12.5 mg at 11/19/12 2216  . naphazoline-pheniramine (NAPHCON-A) 0.025-0.3 % ophthalmic solution 1 drop  1 drop Both Eyes QID PRN Rowe Clack, PA      . ondansetron (ZOFRAN) injection 4 mg  4 mg Intravenous Q6H PRN Rowe Clack, PA  4 mg at 11/10/12 1127  . oxyCODONE (Oxy IR/ROXICODONE) immediate release tablet 5-10 mg  5-10 mg Oral Q3H PRN Kerin Perna, MD   10 mg at 11/18/12 2148  . pantoprazole (PROTONIX) EC tablet 40 mg  40 mg Oral Daily Rowe Clack, PA   40 mg at 11/19/12 0956  . potassium chloride SA (K-DUR,KLOR-CON) CR tablet 40 mEq  40 mEq Oral Daily Rowe Clack, PA   40 mEq at 11/19/12 0946  . sodium chloride 0.9 % injection 10-40 mL  10-40 mL Intracatheter Q12H Kerin Perna, MD   10 mL at 11/19/12 0616  . sodium chloride 0.9 % injection 3 mL  3 mL Intravenous Q12H Rowe Clack, PA   3 mL at 11/15/12 1212  . sodium chloride 0.9 % injection 3 mL  3 mL Intravenous Q12H Kerin Perna, MD   3 mL at 11/18/12 2149  . sodium chloride 0.9 % injection 3 mL  3 mL Intravenous PRN Kerin Perna, MD      . traMADol [name]) tablet 50 mg  50 mg Oral Q6H PRN Kerin Perna, MD   50 mg at 11/17/12 1752  . warfarin (COUMADIN) tablet 2.5 mg  2.5 mg Oral q1800 Erin Barrett, PA      . Warfarin - Physician Dosing Inpatient   Does  not apply q1800 Kerin Perna, MD        PE: General appearance: alert, cooperative and no distress Lungs: clear to auscultation bilaterally Heart: regular rate and rhythm Extremities: trace bilateral LEE Pulses: 2+ and symmetric Skin: warm and dry Neurologic: Grossly normal  PT/INR  Recent Labs  11/18/12 0400 11/19/12 0855 11/20/12 0540  LABPROT 16.9* 21.3* 24.9*  INR 1.41 1.93* 2.38*    Assessment/Plan  Principal Problem:   Unstable angina Active Problems:   CAD-severe LM disease- S/P CABG X 2 (SVG-OM, SVG-LAD) 11/05/12   S/P AVR, St Jude 2000   Dilated ascending aorta at cath 11/03/12 - S/P root replacement 11/05/12   Chronic anticoagulation   Contrast media allergy   HTN (hypertension)   Sarcoidosis  Plan: The plan if for D/C today, either to SNF for rehab or home, depending on insurance. Social work is involved. Pt is stable. NSR on telemetry. INR is still slightly subtherapeutic at 2.38. Coumadin is being managed by CT surgery. Follow up has been arranged at [facility], with Corine Shelter, PA-C on 2/25 at 11:45. She is on ASA, a BB, a statin, and Lasix. ?Consider starting/resuming ACE-I/ARB as an OP. MD to follow with further recommendation, prior to d/c.   LOS: 16 days    Angeles Zehner M. Delmer Islam 11/20/2012 9:14 AM

## 2012-11-20 NOTE — Progress Notes (Signed)
1400 Noted Dr.Hilty's note. Will hold ambulation and continue to follow.Reilly Molchan DunlapRN

## 2012-11-21 ENCOUNTER — Inpatient Hospital Stay (HOSPITAL_COMMUNITY): Payer: PRIVATE HEALTH INSURANCE

## 2012-11-21 ENCOUNTER — Encounter (HOSPITAL_COMMUNITY)
Admission: RE | Disposition: A | Discharge status: Home / Self Care | Payer: Self-pay | Source: Ambulatory Visit | Attending: Cardiothoracic Surgery

## 2012-11-21 ENCOUNTER — Encounter (HOSPITAL_COMMUNITY): Payer: Self-pay | Admitting: Certified Registered Nurse Anesthetist

## 2012-11-21 ENCOUNTER — Encounter (HOSPITAL_COMMUNITY): Payer: Self-pay | Admitting: Anesthesiology

## 2012-11-21 ENCOUNTER — Inpatient Hospital Stay (HOSPITAL_COMMUNITY): Payer: PRIVATE HEALTH INSURANCE | Admitting: Anesthesiology

## 2012-11-21 DIAGNOSIS — I739 Peripheral vascular disease, unspecified: Secondary | ICD-10-CM | POA: Diagnosis present

## 2012-11-21 DIAGNOSIS — I309 Acute pericarditis, unspecified: Secondary | ICD-10-CM

## 2012-11-21 DIAGNOSIS — J90 Pleural effusion, not elsewhere classified: Secondary | ICD-10-CM | POA: Diagnosis not present

## 2012-11-21 HISTORY — PX: INTRAOPERATIVE TRANSESOPHAGEAL ECHOCARDIOGRAM: SHX5062

## 2012-11-21 HISTORY — PX: PERICARDIAL WINDOW: SHX2213

## 2012-11-21 LAB — CBC
HCT: 29.1 % — ABNORMAL LOW (ref 36.0–46.0)
HCT: 28.3 % — ABNORMAL LOW (ref 36.0–46.0)
Hemoglobin: 9.4 g/dL — ABNORMAL LOW (ref 12.0–15.0)
Hemoglobin: 9.3 g/dL — ABNORMAL LOW (ref 12.0–15.0)
MCH: 27.2 pg (ref 26.0–34.0)
MCH: 27.9 pg (ref 26.0–34.0)
MCHC: 32.3 g/dL (ref 30.0–36.0)
MCHC: 32.9 g/dL (ref 30.0–36.0)
MCV: 84.3 fL (ref 78.0–100.0)
Platelets: 456 10*3/uL — ABNORMAL HIGH (ref 150–400)
RBC: 3.45 MIL/uL — ABNORMAL LOW (ref 3.87–5.11)
RBC: 3.33 MIL/uL — ABNORMAL LOW (ref 3.87–5.11)
RDW: 17.5 % — ABNORMAL HIGH (ref 11.5–15.5)
WBC: 13.2 10*3/uL — ABNORMAL HIGH (ref 4.0–10.5)

## 2012-11-21 LAB — URINALYSIS, ROUTINE W REFLEX MICROSCOPIC
Bilirubin Urine: NEGATIVE
Glucose, UA: NEGATIVE mg/dL
Hgb urine dipstick: NEGATIVE
Nitrite: NEGATIVE
Specific Gravity, Urine: 1.04 — ABNORMAL HIGH (ref 1.005–1.030)
pH: 5 (ref 5.0–8.0)

## 2012-11-21 LAB — COMPREHENSIVE METABOLIC PANEL
ALT: 14 U/L (ref 0–35)
AST: 17 U/L (ref 0–37)
Albumin: 3 g/dL — ABNORMAL LOW (ref 3.5–5.2)
Alkaline Phosphatase: 85 U/L (ref 39–117)
BUN: 14 mg/dL (ref 6–23)
CO2: 25 mEq/L (ref 19–32)
Calcium: 9.4 mg/dL (ref 8.4–10.5)
Chloride: 102 mEq/L (ref 96–112)
Creatinine, Ser: 0.78 mg/dL (ref 0.50–1.10)
GFR calc Af Amer: >90 mL/min (ref >90)
GFR calc non Af Amer: 89 mL/min — ABNORMAL LOW (ref >90)
Glucose, Bld: 147 mg/dL — ABNORMAL HIGH (ref 70–99)
Potassium: 4.3 mEq/L (ref 3.5–5.1)
Sodium: 138 mEq/L (ref 135–145)
Total Bilirubin: 1.1 mg/dL (ref 0.3–1.2)
Total Protein: 6.3 g/dL (ref 6.0–8.3)

## 2012-11-21 LAB — PROTIME-INR: INR: 1.35 (ref 0.00–1.49)

## 2012-11-21 LAB — PREPARE RBC (CROSSMATCH)

## 2012-11-21 LAB — APTT: aPTT: 34 seconds (ref 24–37)

## 2012-11-21 SURGERY — CREATION, PERICARDIAL WINDOW
Anesthesia: General | Wound class: Clean

## 2012-11-21 MED ORDER — ONDANSETRON HCL 4 MG/2ML IJ SOLN
INTRAMUSCULAR | Status: DC | PRN
  Administered 2012-11-21: 4 mg via INTRAVENOUS

## 2012-11-21 MED ORDER — LACTATED RINGERS IV SOLN
INTRAVENOUS | Status: DC | PRN
  Administered 2012-11-21: 15:00:00 via INTRAVENOUS

## 2012-11-21 MED ORDER — LABETALOL HCL 5 MG/ML IV SOLN
10.0000 mg | INTRAVENOUS | Status: DC | PRN
  Filled 2012-11-21: qty 4

## 2012-11-21 MED ORDER — CEFUROXIME SODIUM 1.5 G IJ SOLR
1.5000 g | INTRAMUSCULAR | Status: AC
  Administered 2012-11-21: 1.5 g via INTRAVENOUS
  Filled 2012-11-21: qty 1.5

## 2012-11-21 MED ORDER — SUCCINYLCHOLINE CHLORIDE 20 MG/ML IJ SOLN
INTRAMUSCULAR | Status: DC | PRN
  Administered 2012-11-21: 120 mg via INTRAVENOUS

## 2012-11-21 MED ORDER — DEXTROSE-NACL 5-0.9 % IV SOLN: INTRAVENOUS | Status: DC

## 2012-11-21 MED ORDER — OXYCODONE-ACETAMINOPHEN 5-325 MG PO TABS: 1.0000 | ORAL_TABLET | ORAL | Status: DC | PRN

## 2012-11-21 MED ORDER — OXYCODONE HCL 5 MG PO TABS: 5.0000 mg | ORAL_TABLET | Freq: Once | ORAL | Status: DC | PRN

## 2012-11-21 MED ORDER — FENTANYL CITRATE 0.05 MG/ML IJ SOLN
INTRAMUSCULAR | Status: AC
  Administered 2012-11-21: 50 ug via INTRAVENOUS
  Filled 2012-11-21: qty 2

## 2012-11-21 MED ORDER — PANCURONIUM BROMIDE 1 MG/ML IV SOLN
INTRAVENOUS | Status: DC | PRN
  Administered 2012-11-21 (×2): 20 mg via INTRAVENOUS

## 2012-11-21 MED ORDER — 0.9 % SODIUM CHLORIDE (POUR BTL) OPTIME
TOPICAL | Status: DC | PRN
  Administered 2012-11-21: 1000 mL

## 2012-11-21 MED ORDER — MIDAZOLAM HCL 5 MG/5ML IJ SOLN
INTRAMUSCULAR | Status: DC | PRN
  Administered 2012-11-21 (×2): 1 mg via INTRAVENOUS

## 2012-11-21 MED ORDER — FENTANYL CITRATE 0.05 MG/ML IJ SOLN
INTRAMUSCULAR | Status: AC
  Filled 2012-11-21: qty 2

## 2012-11-21 MED ORDER — DEXTROSE 5 % IV SOLN
1.5000 g | Freq: Two times a day (BID) | INTRAVENOUS | Status: AC
  Administered 2012-11-21 – 2012-11-22 (×2): 1.5 g via INTRAVENOUS
  Filled 2012-11-21 (×2): qty 1.5

## 2012-11-21 MED ORDER — SUFENTANIL CITRATE 50 MCG/ML IV SOLN
INTRAVENOUS | Status: DC | PRN
  Administered 2012-11-21: 10 ug via INTRAVENOUS

## 2012-11-21 MED ORDER — LIDOCAINE HCL (CARDIAC) 20 MG/ML IV SOLN
INTRAVENOUS | Status: DC | PRN
  Administered 2012-11-21: 20 mg via INTRAVENOUS

## 2012-11-21 MED ORDER — MIDAZOLAM HCL 2 MG/2ML IJ SOLN
INTRAMUSCULAR | Status: AC
  Filled 2012-11-21: qty 2

## 2012-11-21 MED ORDER — OXYCODONE HCL 5 MG/5ML PO SOLN: 5.0000 mg | Freq: Once | ORAL | Status: DC | PRN

## 2012-11-21 MED ORDER — FENTANYL CITRATE 0.05 MG/ML IJ SOLN
INTRAMUSCULAR | Status: DC | PRN
  Administered 2012-11-21: 150 ug via INTRAVENOUS
  Administered 2012-11-21 (×2): 50 ug via INTRAVENOUS

## 2012-11-21 MED ORDER — ETOMIDATE 2 MG/ML IV SOLN
INTRAVENOUS | Status: DC | PRN
  Administered 2012-11-21: 20 mg via INTRAVENOUS
  Administered 2012-11-21: 2 mg via INTRAVENOUS

## 2012-11-21 MED ORDER — LABETALOL HCL 5 MG/ML IV SOLN
INTRAVENOUS | Status: AC
  Filled 2012-11-21: qty 4

## 2012-11-21 MED ORDER — NEOSTIGMINE METHYLSULFATE 1 MG/ML IJ SOLN
INTRAMUSCULAR | Status: DC | PRN
  Administered 2012-11-21: 3 mg via INTRAVENOUS

## 2012-11-21 MED ORDER — ONDANSETRON HCL 4 MG/2ML IJ SOLN: 4.0000 mg | Freq: Four times a day (QID) | INTRAMUSCULAR | Status: DC | PRN

## 2012-11-21 MED ORDER — IOHEXOL 350 MG/ML SOLN
100.0000 mL | Freq: Once | INTRAVENOUS | Status: AC | PRN
  Administered 2012-11-21: 100 mL via INTRAVENOUS

## 2012-11-21 MED ORDER — HYDROMORPHONE HCL PF 1 MG/ML IJ SOLN: 0.2500 mg | INTRAMUSCULAR | Status: DC | PRN

## 2012-11-21 MED ORDER — FENTANYL CITRATE 0.05 MG/ML IJ SOLN
25.0000 ug | INTRAMUSCULAR | Status: DC | PRN
  Administered 2012-11-21 (×3): 25 ug via INTRAVENOUS
  Administered 2012-11-22 – 2012-11-24 (×7): 50 ug via INTRAVENOUS
  Filled 2012-11-21 (×9): qty 2

## 2012-11-21 MED ORDER — GLYCOPYRROLATE 0.2 MG/ML IJ SOLN
INTRAMUSCULAR | Status: DC | PRN
  Administered 2012-11-21: 0.4 mg via INTRAVENOUS

## 2012-11-21 MED ORDER — ACETAMINOPHEN 10 MG/ML IV SOLN
1000.0000 mg | Freq: Four times a day (QID) | INTRAVENOUS | Status: AC
  Administered 2012-11-21 – 2012-11-22 (×4): 1000 mg via INTRAVENOUS
  Filled 2012-11-21 (×5): qty 100

## 2012-11-21 MED ORDER — VANCOMYCIN HCL IN DEXTROSE 1-5 GM/200ML-% IV SOLN
1000.0000 mg | Freq: Two times a day (BID) | INTRAVENOUS | Status: AC
  Administered 2012-11-21 – 2012-11-23 (×4): 1000 mg via INTRAVENOUS
  Filled 2012-11-21 (×4): qty 200

## 2012-11-21 MED ORDER — POTASSIUM CHLORIDE 10 MEQ/50ML IV SOLN
10.0000 meq | Freq: Every day | INTRAVENOUS | Status: DC | PRN
  Administered 2012-11-23 (×3): 10 meq via INTRAVENOUS
  Filled 2012-11-21: qty 150
  Filled 2012-11-21: qty 50
  Filled 2012-11-21: qty 150

## 2012-11-21 MED ORDER — LACTATED RINGERS IV SOLN
INTRAVENOUS | Status: DC | PRN
  Administered 2012-11-21: 14:00:00 via INTRAVENOUS

## 2012-11-21 SURGICAL SUPPLY — 55 items
APL SKNCLS STERI-STRIP NONHPOA (GAUZE/BANDAGES/DRESSINGS) ×1
ATTRACTOMAT 16X20 MAGNETIC DRP (DRAPES) ×3 IMPLANT
BENZOIN TINCTURE PRP APPL 2/3 (GAUZE/BANDAGES/DRESSINGS) ×3 IMPLANT
CANISTER SUCTION 2500CC (MISCELLANEOUS) ×3 IMPLANT
CATH ROBINSON RED A/P 18FR (CATHETERS) ×2 IMPLANT
CATH THORACIC 28FR (CATHETERS) IMPLANT
CATH THORACIC 28FR RT ANG (CATHETERS) IMPLANT
CATH THORACIC 36FR (CATHETERS) IMPLANT
CATH THORACIC 36FR RT ANG (CATHETERS) IMPLANT
CLOSURE WOUND 1/2 X4 (GAUZE/BANDAGES/DRESSINGS) ×1
CLOTH BEACON ORANGE TIMEOUT ST (SAFETY) ×3 IMPLANT
CONT SPEC 4OZ CLIKSEAL STRL BL (MISCELLANEOUS) IMPLANT
COVER SURGICAL LIGHT HANDLE (MISCELLANEOUS) ×6 IMPLANT
DRAIN CHANNEL 28F RND 3/8 FF (WOUND CARE) ×3 IMPLANT
DRAPE LAPAROSCOPIC ABDOMINAL (DRAPES) ×3 IMPLANT
ELECT BLADE 4.0 EZ CLEAN MEGAD (MISCELLANEOUS) ×3
ELECT CAUTERY BLADE 6.4 (BLADE) ×2 IMPLANT
ELECT REM PT RETURN 9FT ADLT (ELECTROSURGICAL) ×3
ELECTRODE BLDE 4.0 EZ CLN MEGD (MISCELLANEOUS) IMPLANT
ELECTRODE REM PT RTRN 9FT ADLT (ELECTROSURGICAL) ×1 IMPLANT
GAUZE XEROFORM 5X9 LF (GAUZE/BANDAGES/DRESSINGS) ×2 IMPLANT
GLOVE BIO SURGEON STRL SZ7.5 (GLOVE) ×6 IMPLANT
HEMOSTAT POWDER SURGIFOAM 1G (HEMOSTASIS) IMPLANT
KIT BASIN OR (CUSTOM PROCEDURE TRAY) ×3 IMPLANT
KIT ROOM TURNOVER OR (KITS) ×3 IMPLANT
NS IRRIG 1000ML POUR BTL (IV SOLUTION) ×3 IMPLANT
PACK CHEST (CUSTOM PROCEDURE TRAY) ×3 IMPLANT
PAD ARMBOARD 7.5X6 YLW CONV (MISCELLANEOUS) ×6 IMPLANT
PAD ELECT DEFIB RADIOL ZOLL (MISCELLANEOUS) ×3 IMPLANT
SET MICROPUNCTURE 5F STIFF (MISCELLANEOUS) ×4 IMPLANT
SPONGE GAUZE 4X4 12PLY (GAUZE/BANDAGES/DRESSINGS) ×2 IMPLANT
STOPCOCK 4 WAY LG BORE MALE ST (IV SETS) ×2 IMPLANT
STRIP CLOSURE SKIN 1/2X4 (GAUZE/BANDAGES/DRESSINGS) ×2 IMPLANT
SUT ETHILON 3 0 FSL (SUTURE) ×2 IMPLANT
SUT SILK  1 MH (SUTURE) ×2
SUT SILK 1 MH (SUTURE) IMPLANT
SUT SILK 2 0 SH CR/8 (SUTURE) ×5 IMPLANT
SUT VIC AB 1 CTX 18 (SUTURE) ×5 IMPLANT
SUT VIC AB 1 CTX 36 (SUTURE) ×6
SUT VIC AB 1 CTX36XBRD ANBCTR (SUTURE) ×1 IMPLANT
SUT VIC AB 2-0 CT1 27 (SUTURE) ×3
SUT VIC AB 2-0 CT1 TAPERPNT 27 (SUTURE) IMPLANT
SUT VIC AB 3-0 X1 27 (SUTURE) ×5 IMPLANT
SWAB COLLECTION DEVICE MRSA (MISCELLANEOUS) ×2 IMPLANT
SYR 50ML SLIP (SYRINGE) IMPLANT
SYRINGE 10CC LL (SYRINGE) IMPLANT
SYSTEM SAHARA CHEST DRAIN ATS (WOUND CARE) ×2 IMPLANT
TAPE CLOTH SURG 4X10 WHT LF (GAUZE/BANDAGES/DRESSINGS) ×2 IMPLANT
TOWEL OR 17X24 6PK STRL BLUE (TOWEL DISPOSABLE) ×3 IMPLANT
TOWEL OR 17X26 10 PK STRL BLUE (TOWEL DISPOSABLE) ×3 IMPLANT
TRAP SPECIMEN MUCOUS 40CC (MISCELLANEOUS) ×2 IMPLANT
TRAY FOLEY CATH 14FRSI W/METER (CATHETERS) ×3 IMPLANT
TRAY FOLEY IC TEMP SENS 14FR (CATHETERS) ×3 IMPLANT
TUBE ANAEROBIC SPECIMEN COL (MISCELLANEOUS) ×2 IMPLANT
WATER STERILE IRR 1000ML POUR (IV SOLUTION) ×6 IMPLANT

## 2012-11-21 NOTE — Brief Op Note (Signed)
  11/04/2012 - 11/21/2012  4:13 PM  PATIENT:  [name]  62 y.o. female  PRE-OPERATIVE DIAGNOSIS:  Pericardial effusion  POST-OPERATIVE DIAGNOSIS:  Pericardial Effusion  PROCEDURE:  Procedure(s): PERICARDIAL WINDOW INTRAOPERATIVE TRANSESOPHAGEAL ECHOCARDIOGRAM   SURGEON:  Surgeon(s): Kerin Perna, MD Kerin Perna, MD  PHYSICIAN ASSISTANT: WAYNE GOLD PA-C  ANESTHESIA:   general  PATIENT CONDITION:  ICU - extubated and stable.  PRE-OPERATIVE WEIGHT: 74kg  FINDINGS: HEMATOMA  COMPLICATIONS: NO KNOWN

## 2012-11-21 NOTE — Progress Notes (Addendum)
INITIAL NUTRITION ASSESSMENT  DOCUMENTATION CODES Per approved criteria  -Not Applicable   INTERVENTION:  No nutrition intervention at this time ---> patient declined RD to follow for nutrition care plan  NUTRITION DIAGNOSIS: Inadequate oral intake related to decreased appetite, prolonged hospitalization as evidenced by PO intake 0-100%  Goal: Oral intake with meals & supplements to meet >/= 90% of estimated nutrition needs  Monitor:  PO & supplemental intake, weight, labs, I/O's  Reason for Assessment: poor PO intake   62 y.o. female  Admitting Dx: Acute pericardial effusion  ASSESSMENT: Patient s/p procedures 2/6: CORONARY ARTERY BYPASS GRAFTING (CABG)   REPLACEMENT ASCENDING AORTA  REDO STERNOTOMY  INTRAOPERATIVE TRANSESOPHAGEAL ECHOCARDIOGRAM  ENDOVEIN HARVEST OF GREATER SAPHENOUS VEIN (Right)  Patient reports her appetite hasn't been very good since hospitalization; noted possible need for surgical intervention; RD offered patient supplements between meals ---> declined and stated "I just want to go home."  Height: Ht Readings from Last 1 Encounters:  11/05/12 5\' 7"  (1.702 m)    Weight: Wt Readings from Last 1 Encounters:  11/21/12 165 lb (74.844 kg)    Ideal Body Weight: 135 lb  % Ideal Body Weight: 122%  Wt Readings from Last 10 Encounters:  11/21/12 165 lb (74.844 kg)  11/21/12 165 lb (74.844 kg)  11/21/12 165 lb (74.844 kg)  11/21/12 165 lb (74.844 kg)    Usual Body Weight: ---  % Usual Body Weight: ---  BMI:  Body mass index is 25.84 kg/(m^2).  Estimated Nutritional Needs: Kcal: 1800-2000 Protein: 90-100 gm Fluid: 1.8-2.0 L  Skin: incisions to chest, groin and leg  Diet Order: Carb Control  EDUCATION NEEDS: -No education needs identified at this time   Intake/Output Summary (Last 24 hours) at 11/21/12 1120 Last data filed at 11/21/12 1100  Gross per 24 hour  Intake    480 ml  Output   1550 ml  Net  -1070 ml    Last BM:  2/17  Labs:   Recent Labs Lab 11/15/12 0600 11/20/12 1240  NA 142 140  K 3.5 4.2  CL 100 104  CO2 33* 28  BUN 15 10  CREATININE 0.86 0.82  CALCIUM 9.1 9.2  GLUCOSE 124* 136*    Scheduled Meds: . amiodarone  200 mg Oral BID  . aspirin EC  81 mg Oral Daily  . atorvastatin  40 mg Oral q1800  . bisacodyl  10 mg Oral Daily   Or  . bisacodyl  10 mg Rectal Daily  . docusate sodium  200 mg Oral Daily  . ferrous fumarate-b12-vitamic C-folic acid  1 capsule Oral TID PC  . metoprolol tartrate  12.5 mg Oral BID  . pantoprazole  40 mg Oral Daily  . sodium chloride  10-40 mL Intracatheter Q12H    Continuous Infusions:   Past Medical History  Diagnosis Date  . Coronary artery disease 2000    aortic mechanical valve replacement  . Anginal pain   . Shortness of breath     Past Surgical History  Procedure Laterality Date  . Vascular surgery    . Coronary artery bypass graft  11/05/2012    Procedure: CORONARY ARTERY BYPASS GRAFTING (CABG);  Surgeon: Kerin Perna, MD;  Location: [facility] OR;  Service: Open Heart Surgery;  Laterality: N/A;  cannulate right subclavian  . Replacement ascending aorta  11/05/2012    Procedure: REPLACEMENT ASCENDING AORTA;  Surgeon: Kerin Perna, MD;  Location: [facility] OR;  Service: Open Heart Surgery;  Laterality: N/A;  .  Intraoperative transesophageal echocardiogram  11/05/2012    Procedure: INTRAOPERATIVE TRANSESOPHAGEAL ECHOCARDIOGRAM;  Surgeon: Kerin Perna, MD;  Location: [facility] OR;  Service: Open Heart Surgery;  Laterality: N/A;  . Endovein harvest of greater saphenous vein  11/05/2012    Procedure: ENDOVEIN HARVEST OF GREATER SAPHENOUS VEIN;  Surgeon: Kerin Perna, MD;  Location: [facility] OR;  Service: Open Heart Surgery;  Laterality: Right;    Maureen Chatters, RD, LDN Pager #: [phone] After-Hours Pager #: [phone]

## 2012-11-21 NOTE — Progress Notes (Signed)
TCTS  CTA shows hematoma pressing on R Atrium, no false aneurysm of the aorta Plan sub xyphoid window , possible redo sternotomy to evacuate hematoma.  The patient was examined and preop studies reviewed. There has been no change from the prior exam and the patient is ready for surgery.

## 2012-11-21 NOTE — Progress Notes (Signed)
Held ambulation yesterday. Please address for today. Will await TCTS team. Thx. Ethelda Chick CES, ACSM

## 2012-11-21 NOTE — Progress Notes (Signed)
TCTS BRIEF SICU PROGRESS NOTE  Day of Surgery  S/P Procedure(s) (LRB): PERICARDIAL WINDOW (N/A) INTRAOPERATIVE TRANSESOPHAGEAL ECHOCARDIOGRAM (N/A) REDO STERNOTOMY      PUMP STANDBY (N/A)   Stable postop Awake and alert, mild chest pain Breathing comfortably w/ O2 sats 99-100% on 2 L/min UOP adequate Minimal chest tube output  Plan: Continue current plan  [name] H 11/21/2012 8:25 PM

## 2012-11-21 NOTE — Preoperative (Signed)
Beta Blockers   Reason not to administer Beta Blockers:Not Applicable 

## 2012-11-21 NOTE — Anesthesia Preprocedure Evaluation (Signed)
Anesthesia Evaluation  Patient identified by MRN, date of birth, ID band Patient awake    Reviewed: Allergy & Precautions, H&P , NPO status , Patient's Chart, lab work & pertinent test results  Airway Mallampati: II  Neck ROM: full    Dental   Pulmonary shortness of breath,          Cardiovascular hypertension, + angina + CAD, + CABG and + Peripheral Vascular Disease  Pericardial effusion now causing respiratory compromise when lying flat.   Neuro/Psych    GI/Hepatic   Endo/Other    Renal/GU      Musculoskeletal   Abdominal   Peds  Hematology   Anesthesia Other Findings   Reproductive/Obstetrics                           Anesthesia Physical Anesthesia Plan  ASA: III  Anesthesia Plan: General   Post-op Pain Management:    Induction: Intravenous  Airway Management Planned: Oral ETT  Additional Equipment: Arterial line, CVP and TEE  Intra-op Plan:   Post-operative Plan: Possible Post-op intubation/ventilation  Informed Consent: I have reviewed the patients History and Physical, chart, labs and discussed the procedure including the risks, benefits and alternatives for the proposed anesthesia with the patient or authorized representative who has indicated his/her understanding and acceptance.     Plan Discussed with: CRNA and Surgeon  Anesthesia Plan Comments:         Anesthesia Quick Evaluation

## 2012-11-21 NOTE — Transfer of Care (Signed)
Immediate Anesthesia Transfer of Care Note  Patient: [name]  Procedure(s) Performed: Procedure(s): PERICARDIAL WINDOW (N/A) INTRAOPERATIVE TRANSESOPHAGEAL ECHOCARDIOGRAM (N/A) REDO STERNOTOMY      PUMP STANDBY (N/A)  Patient Location: PACU  Anesthesia Type:General  Level of Consciousness: awake and alert   Airway & Oxygen Therapy: Patient Spontanous Breathing and Patient connected to nasal cannula oxygen  Post-op Assessment: Report given to PACU RN, Post -op Vital signs reviewed and stable and Patient moving all extremities  Post vital signs: Reviewed and stable  Complications: No apparent anesthesia complications

## 2012-11-21 NOTE — Progress Notes (Signed)
RT attempted ABG twice. OR transport waiting.

## 2012-11-21 NOTE — Anesthesia Postprocedure Evaluation (Signed)
Anesthesia Post Note  Patient: [name]  Procedure(s) Performed: Procedure(s) (LRB): PERICARDIAL WINDOW (N/A) INTRAOPERATIVE TRANSESOPHAGEAL ECHOCARDIOGRAM (N/A) REDO STERNOTOMY      PUMP STANDBY (N/A)  Anesthesia type: General  Patient location: PACU  Post pain: Pain level controlled and Adequate analgesia  Post assessment: Post-op Vital signs reviewed, Patient's Cardiovascular Status Stable, Respiratory Function Stable, Patent Airway and Pain level controlled  Last Vitals:  Filed Vitals:   11/21/12 1700  BP: 160/64  Pulse: 74  Temp:   Resp: 16    Post vital signs: Reviewed and stable  Level of consciousness: awake, alert  and oriented  Complications: No apparent anesthesia complications

## 2012-11-21 NOTE — Progress Notes (Signed)
PT Cancellation Note  Patient Details Name: [name] MRN: [id number] DOB: September 07, 1951   Cancelled Treatment:    Reason Eval/Treat Not Completed: Other (comment) (Pt heading soon for ?thoracentesis and politely refuses)  11/21/2012  [place] Bing, PT [phone] (pager)   [name], [name] 11/21/2012, 12:40 PM

## 2012-11-21 NOTE — Progress Notes (Signed)
Subjective:  Up in chair no acute distress.  Objective:  Vital Signs in the last 24 hours: Temp:  [97.4 F (36.3 C)-98.6 F (37 C)] 97.4 F (36.3 C) (02/20 0423) Pulse Rate:  [79-96] 81 (02/20 0423) Resp:  [18-22] 18 (02/20 0423) BP: (96-120)/(48-67) 118/60 mmHg (02/20 0423) SpO2:  [95 %-98 %] 95 % (02/20 0423) Weight:  [74.844 kg (165 lb)] 74.844 kg (165 lb) (02/20 0423)  Intake/Output from previous day:  Intake/Output Summary (Last 24 hours) at 11/21/12 1104 Last data filed at 11/21/12 1100  Gross per 24 hour  Intake    480 ml  Output   1550 ml  Net  -1070 ml    Physical Exam: General appearance: alert, cooperative and no distress Lungs: decreased breath sounds 1/3 way up on RT Heart: regular rate and rhythm and 2/6 systolic murmur with positive valve sounds   Rate: 82  Rhythm: normal sinus rhythm  Lab Results:  Recent Labs  11/20/12 1240  WBC 12.7*  HGB 8.8*  PLT 432*    Recent Labs  11/20/12 1240  NA 140  K 4.2  CL 104  CO2 28  GLUCOSE 136*  BUN 10  CREATININE 0.82   No results found for this basename: TROPONINI, CK, MB,  in the last 72 hours Hepatic Function Panel No results found for this basename: PROT, ALBUMIN, AST, ALT, ALKPHOS, BILITOT, BILIDIR, IBILI,  in the last 72 hours No results found for this basename: CHOL,  in the last 72 hours  Recent Labs  11/21/12 0620  INR 1.35    Imaging: Dg Chest 2 View  11/21/2012  *RADIOLOGY REPORT*  Clinical Data: Status post aortic valve replacement.  Weakness and shortness of breath.  CHEST - 2 VIEW  Comparison: CT chest 11/20/2012 and PA and lateral chest 11/18/2012.  Findings: Moderate right pleural effusion and basilar airspace disease appear slightly improved.  The left lung is clear.  No pneumothorax is identified.  Contour abnormality along the right heart border correlating with loculated hemopericardium does not appear markedly changed.  Left PICC remains in place.  IMPRESSION:  1.  Slight  decrease in right pleural effusion and basilar airspace disease since the most recent plain films. 2.  No marked change in contour abnormality along the right heart border correlating with hemopericardium seen on prior CT.   Original Report Authenticated By: Holley Dexter, M.D.    Ct Chest Wo Contrast  11/20/2012  *RADIOLOGY REPORT*  Clinical Data: Unable to breathe.  Pleural effusion.  14 days status post CABG and replacement of the ascending thoracic aorta.  CT CHEST WITHOUT CONTRAST  Technique:  Multidetector CT imaging of the chest was performed following the standard protocol without IV contrast.  Comparison: Chest CT 11/04/2012.  Findings:  Mediastinum: There is a large volume of high attenuation fluid in the pericardium adjacent to the proximal ascending thoracic aorta and overlying the superior aspect of the right atrium extending inferiorly to the heart along the right atrioventricular groove. The largest portion of this collection superiorly measures up to 4.8 x 6.0 cm (image 28 of series 2), while inferiorly in the largest portion in the right atrioventricular groove measures up to 5.8 x 5.0 cm on image 36 of series 2.  This appears to exert significant mass effect upon the adjacent right atrium and right ventricle which appears compressed by the collection. Postoperative changes of recent redo sternotomy are noted, with a small amount of soft tissue stranding throughout the anterior mediastinum which is  within normal limits.  Old aortic root replacement with a mechanical aortic valve is again noted, and there is now evidence of graft replacement of the ascending thoracic aorta.  There is a slight kink at the anastomosis between the aortic root and the new graft, however, this is similar to the orientation of the aortic root and the native aorta on the prior examination.  There is a graft marker on the anterior aspect of the ascending aorta graft, suggesting interval CABG as well.  Extensive  atherosclerosis throughout the thoracic aorta is redemonstrated, and there is ectasia of the the arch and descending thoracic aorta which measured up to 4.0 and 3.7 cm in diameter respectively. Multiple borderline enlarged and mildly enlarged mediastinal and hilar lymph nodes are noted, presumably reactive. A surgical clip in the left supraclavicular region is unchanged.  Left upper extremity PICC with tip terminating in the distal superior vena cava.  A small amount of high-density material in the right pectoralis major and minor musculature is of uncertain etiology and significance, but is new compared to the prior study is presumably postoperative (possibly related to right axillary artery cannulation).  Lungs/Pleura: Moderate right and small left-sided pleural effusions.  Passive atelectasis throughout much of the right lower lobe with minimal dependent atelectasis in the left lower lobe. There is a background of very mild diffuse ground glass attenuation with some mild interlobular septal thickening in the lungs bilaterally, which could suggest very mild interstitial pulmonary edema.  No frank airspace consolidation.  Upper Abdomen: Unremarkable.  Specifically, the inferior vena cava appears only moderately distended.  Musculoskeletal: Sternotomy wires. There are no aggressive appearing lytic or blastic lesions noted in the visualized portions of the skeleton.  IMPRESSION: 1.  Interval postoperative changes of graft replacement of the ascending thoracic aorta, now with a large volume of hemopericardium which appears loculated adjacent to the proximal aspect of the graft and overlying the right heart extending inferiorly along the right atrioventricular groove.  This is a rather large collection which appears to exert some mass effect upon the adjacent right atrium and right ventricle.  Clinical correlation for signs and symptoms of tamponade physiology is recommended; however, the inferior vena cava is only  moderately distended at this time. 2.  Moderate right-sided and small left-sided pleural effusions with associated atelectasis in the lower lobes of the lungs bilaterally, as above. 3.  The appearance of the lungs suggests a background of very mild interstitial pulmonary edema. 4.  Additional incidental findings, as above.  These results were called by telephone on 11/20/2012 at 10:00 a.m. to Dr. Donata Clay, who verbally acknowledged these results.   Original Report Authenticated By: Trudie Reed, M.D.    Ct Angio Chest Aorta W/cm &/or Wo/cm  11/21/2012  *RADIOLOGY REPORT*  Clinical Data: Status post CABG and replacement of ascending thoracic aorta with postoperative pericardial hematoma.  CT ANGIOGRAPHY CHEST  .  IMPRESSION:  1.  Decreased density of right-sided pericardial/mediastinal hematoma with overall size stable to slightly smaller.  No new bleeding identified. 2.  CT demonstrates normal patency of the proximal aortic graft without evidence of anastomotic leak, pseudoaneurysm or dissection. 3.  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   Original Report Authenticated By: Irish Lack, M.D.     Cardiac Studies:  Assessment/Plan:   Principal Problem:   Acute pericardial effusion post op without tamponade Active Problems:   CAD-severe LM disease- S/P CABG X 2 (SVG-OM, SVG-LAD) 11/05/12 (hx of Lt SCA stenosis)  Dilated ascending aorta at cath 11/03/12 - S/P root replacement 11/05/12   S/P AVR, St Jude 2000   Pleural effusion, RT, post op   Unstable angina on admission 11/04/12   Chronic anticoagulation for mechanical AVR   Contrast media allergy   HTN (hypertension)   Sarcoidosis   PVD , known Lt SCA stenosis    Plan- Repeat CT shows some decrease in pericardial effusion Also, Rt pleura effusion somewhat smaller. Plan per CVTS.   Corine Shelter PA-C 11/21/2012, 11:04 AM  I have seen and examined the patient along with Corine Shelter PA-C.  I have reviewed the chart, notes and  new data.  I agree with PA's note.   Key new findings / data: the echo does not show any meaningful pericardial abnormality or sign of tamponade. There is evidence of inferior vena cava dilation, conceivably attributable to the R thoracic hematoma causing right atrial compression, but otherwise no signs of hemodynamic compromise. There is a plan for surgical hematoma evacuation.   Thurmon Fair, MD, [facility] [phone] 11/21/2012, 3:08 PM

## 2012-11-22 ENCOUNTER — Inpatient Hospital Stay (HOSPITAL_COMMUNITY): Payer: PRIVATE HEALTH INSURANCE

## 2012-11-22 ENCOUNTER — Encounter (HOSPITAL_COMMUNITY): Payer: Self-pay | Admitting: Cardiothoracic Surgery

## 2012-11-22 LAB — BLOOD GAS, ARTERIAL
Acid-Base Excess: 2.9 mmol/L — ABNORMAL HIGH (ref 0.0–2.0)
Bicarbonate: 26.9 mEq/L — ABNORMAL HIGH (ref 20.0–24.0)
O2 Saturation: 96.2 %
TCO2: 28.1 mmol/L (ref 0–100)
pO2, Arterial: 74.5 mmHg — ABNORMAL LOW (ref 80.0–100.0)

## 2012-11-22 LAB — BASIC METABOLIC PANEL
BUN: 13 mg/dL (ref 6–23)
Chloride: 105 mEq/L (ref 96–112)
Glucose, Bld: 128 mg/dL — ABNORMAL HIGH (ref 70–99)
Potassium: 4.1 mEq/L (ref 3.5–5.1)

## 2012-11-22 LAB — CBC
HCT: 25.6 % — ABNORMAL LOW (ref 36.0–46.0)
Hemoglobin: 8.5 g/dL — ABNORMAL LOW (ref 12.0–15.0)
MCHC: 33.2 g/dL (ref 30.0–36.0)
MCV: 83.7 fL (ref 78.0–100.0)
RDW: 17.3 % — ABNORMAL HIGH (ref 11.5–15.5)

## 2012-11-22 MED ORDER — TRAMADOL HCL 50 MG PO TABS: 50.0000 mg | ORAL_TABLET | Freq: Four times a day (QID) | ORAL | Status: DC | PRN

## 2012-11-22 MED ORDER — FUROSEMIDE 10 MG/ML IJ SOLN
20.0000 mg | Freq: Two times a day (BID) | INTRAMUSCULAR | Status: DC
  Administered 2012-11-22 – 2012-11-23 (×3): 20 mg via INTRAVENOUS
  Filled 2012-11-22 (×6): qty 2

## 2012-11-22 NOTE — Clinical Social Work Note (Signed)
CSW following to assist with dc planning. CSW was arranging SNF prior to Pt's recent procedure. Pt had grown frustrated with plan to go to SNF and stated that she wanted to go home. Pt's sons were trying to arrange their work schedules to support Pt at home.  CSW will f/u on Monday to assist as needed for SNF. Pt can still go to SNF if needed next week. Pt might also be a candidate for CIR now that family has completed FMLA paperwork to be able to be home to support her.   CSW will continue to follow.   Frederico Hamman, LCSW [phone]

## 2012-11-22 NOTE — Progress Notes (Signed)
[facility]  Subjective: No complaints. No subjective SOB, but she is using low supplemental O2.  Objective: Vital signs in last 24 hours: Temp:  [97.3 F (36.3 C)-98.5 F (36.9 C)] 97.3 F (36.3 C) (02/21 0749) Pulse Rate:  [71-88] 77 (02/21 0800) Resp:  [12-28] 20 (02/21 0800) BP: (97-161)/(41-67) 110/41 mmHg (02/21 0800) SpO2:  [97 %-100 %] 100 % (02/21 0800) Arterial Line BP: (96-175)/(37-62) 154/48 mmHg (02/21 0800) Weight:  [174 lb (78.926 kg)] 174 lb (78.926 kg) (02/21 0500) Last BM Date: 11/18/12  Intake/Output from previous day: 02/20 0701 - 02/21 0700 In: 2546.3 [I.V.:1996.3; IV Piggyback:550] Out: 1185 [Urine:1025; Blood:100; Chest Tube:60] Intake/Output this shift: Total I/O In: 75 [I.V.:75] Out: 200 [Urine:200]  Medications Current Facility-Administered Medications  Medication Dose Route Frequency Provider Last Rate Last Dose  . acetaminophen (OFIRMEV) IV 1,000 mg  1,000 mg Intravenous Q6H Rowe Clack, PA   1,000 mg at 11/22/12 1610  . acetaminophen (TYLENOL) tablet 650 mg  650 mg Oral Q6H PRN Kerin Perna, MD   650 mg at 11/19/12 0502  . amiodarone (PACERONE) tablet 200 mg  200 mg Oral BID Kerin Perna, MD   200 mg at 11/21/12 2232  . aspirin EC tablet 81 mg  81 mg Oral Daily Kerin Perna, MD   81 mg at 11/21/12 1019  . atorvastatin (LIPITOR) tablet 40 mg  40 mg Oral q1800 Rowe Clack, PA   40 mg at 11/20/12 1803  . bisacodyl (DULCOLAX) EC tablet 10 mg  10 mg Oral Daily Rowe Clack, [place]   10 mg at 11/19/12 9604   Or  . bisacodyl (DULCOLAX) suppository 10 mg  10 mg Rectal Daily Rowe Clack, PA      . bisacodyl (DULCOLAX) EC tablet 10 mg  10 mg Oral Daily PRN Kerin Perna, MD   10 mg at 11/16/12 2212   Or  . bisacodyl (DULCOLAX) suppository 10 mg  10 mg Rectal Daily PRN Kerin Perna, MD      . cefUROXime (ZINACEF) 1.5 g in dextrose 5 % 50 mL IVPB  1.5 g Intravenous Q12H Rowe Clack, PA   1.5 g at 11/21/12 2156   . dextrose 5 %-0.9 % sodium chloride infusion   Intravenous Continuous Rowe Clack, PA 75 mL/hr at 11/21/12 1815    . fentaNYL (SUBLIMAZE) injection 25-50 mcg  25-50 mcg Intravenous Q2H PRN Rowe Clack, PA   50 mcg at 11/22/12 5409  . ferrous fumarate-b12-vitamic C-folic acid (TRINSICON / FOLTRIN) capsule 1 capsule  1 capsule Oral TID PC Kerin Perna, MD   1 capsule at 11/21/12 1222  . guaiFENesin (MUCINEX) 12 hr tablet 600 mg  600 mg Oral Q12H PRN Kerin Perna, MD      . labetalol (NORMODYNE,TRANDATE) injection 10 mg  10 mg Intravenous Q2H PRN Kerin Perna, MD      . magnesium hydroxide (MILK OF MAGNESIA) suspension 30 mL  30 mL Oral Daily PRN Kerin Perna, MD      . metoprolol tartrate (LOPRESSOR) tablet 12.5 mg  12.5 mg Oral BID Kerin Perna, MD   12.5 mg at 11/21/12 1019  . naphazoline-pheniramine (NAPHCON-A) 0.025-0.3 % ophthalmic solution 1 drop  1 drop Both Eyes QID PRN Rowe Clack, PA      . ondansetron (ZOFRAN) injection 4 mg  4 mg Intravenous Q6H PRN Rowe Clack, PA   4 mg at  11/10/12 1127  . oxyCODONE (Oxy IR/ROXICODONE) immediate release tablet 5-10 mg  5-10 mg Oral Q3H PRN Kerin Perna, MD   10 mg at 11/20/12 2103  . oxyCODONE-acetaminophen (PERCOCET/ROXICET) 5-325 MG per tablet 1-2 tablet  1-2 tablet Oral Q4H PRN Rowe Clack, PA      . pantoprazole (PROTONIX) EC tablet 40 mg  40 mg Oral Daily Rowe Clack, PA   40 mg at 11/21/12 1019  . potassium chloride 10 mEq in 50 mL *CENTRAL LINE* IVPB  10 mEq Intravenous Daily PRN Wayne E Gold, PA      . sodium chloride 0.9 % injection 10-40 mL  10-40 mL Intracatheter Q12H Kerin Perna, MD   40 mL at 11/21/12 2200  . traMADol (ULTRAM) tablet 50 mg  50 mg Oral Q6H PRN Kerin Perna, MD   50 mg at 11/17/12 1752  . vancomycin (VANCOCIN) IVPB 1000 mg/200 mL premix  1,000 mg Intravenous Q12H Kerin Perna, MD   1,000 mg at 11/22/12 1610    PE: General appearance: alert, cooperative and no distress Lungs: clear to  auscultation bilaterally Heart: regular rate and rhythm Pulses: 2+ and symmetric Skin: warm and dry Neurologic: Grossly normal  Lab Results:   Recent Labs  11/21/12 1700 11/21/12 1830 11/22/12 0337  WBC 13.2* 14.7* 17.4*  HGB 9.4* 9.3* 8.5*  HCT 29.1* 28.3* 25.6*  PLT 456* 447* 418*   BMET  Recent Labs  11/20/12 1240 11/21/12 1700 11/22/12 0337  NA 140 138 139  K 4.2 4.3 4.1  CL 104 102 105  CO2 28 25 28   GLUCOSE 136* 147* 128*  BUN 10 14 13   CREATININE 0.82 0.78 0.86  CALCIUM 9.2 9.4 8.8   PT/INR  Recent Labs  11/20/12 0540 11/21/12 0620 11/22/12 0337  LABPROT 24.9* 16.4* 19.2*  INR 2.38* 1.35 1.68*    Studies/Results: CXR 2/21  *RADIOLOGY REPORT*  Clinical Data: Pericardial window.  PORTABLE CHEST - 1 VIEW  Comparison: Plain film chest and CT chest 11/21/2012.  Findings: Left IJ catheter and pericardial window drainage catheter  are again seen. Contour abnormality along the right heart border  consistent with hematoma as seen on prior CT appears unchanged.  Right pleural effusion and basilar airspace disease appears  slightly improved. Left basilar atelectasis has improved.  IMPRESSION:  1. Some improvement in right pleural effusion and basilar airspace  disease.  2. No change in contour abnormality along the right heart border  consistent with hematoma seen on prior CT.  3. Improved left basilar atelectasis.  Original Report Authenticated By: Holley Dexter, M.D.    Assessment/Plan  Principal Problem:   Acute pericardial effusion post op without tamponade - S/P subxiphoid pericaridal window 11/21/12 Active Problems:   Unstable angina on admission 11/04/12   CAD-severe LM disease- S/P CABG X 2 (SVG-OM, SVG-LAD) 11/05/12 (hx of Lt SCA stenosis)   S/P AVR, St Jude 2000   Dilated ascending aorta at cath 11/03/12 - S/P root replacement 11/05/12   Chronic anticoagulation for mechanical AVR   Contrast media allergy   HTN (hypertension)    Sarcoidosis   PVD , known Lt SCA stenosis   Pleural effusion, RT, post op  Plan: S/P subxiphoid pericardial window to drain post-operative pericardial effusion. POD# 1. Chest tube remains in place. 60cc of bloody fluid have been removed in last 24 hrs.  Post-op CXR yesterday showed no evidence of pneumothorax. It also demonstrated decreased prominence of the right heart border. CXR from  today shows no change in contour abnormality compared to yesterday. Pt denies significant SOB, but is on 1.5L of supplemental O2. SpO2 is at 98%. No chest pain. NSR on telemetry. HR and BP both stable. Will continue to follow CT drainage, and CXR for signs of improvement. ? If and when a repeat echo may be needed. Will continue to monitor. MD to follow with further recommendation. Pt already has a follow up appointment at [facility] late next week. May have to reschedule depending on when she is D/C.   LOS: 18 days    Brittainy M. Delmer Islam 11/22/2012 8:38 AM   Patient seen and examined. Agree with assessment and plan. Breathing better; hemodynamics are stable day 1 s/p evacuation of pericardial hematoma. Maintaining sinus rhythm with excellent oxygen saturation.   Lennette Bihari, MD, [facility] 11/22/2012 12:24 PM

## 2012-11-22 NOTE — Progress Notes (Addendum)
PT Cancellation Note  Patient Details Name: [name] MRN: [id number] DOB: 03/13/1951   Cancelled Treatment:    Reason Eval/Treat Not Completed: Other (comment) (MD:  Need reorder after pericardial window surgery.  Please reorder as you feel appropriate.)   INGOLD,Rakeb Kibble 11/22/2012, 7:51 AM [facility] Acute Rehabilitation [phone] (pager)

## 2012-11-22 NOTE — Op Note (Signed)
[name], [name] NO.:  [id number]  MEDICAL RECORD NO.:  [id number]  LOCATION:  2309                         FACILITY:  [facility]  PHYSICIAN:  Kerin Perna, M.D.  DATE OF BIRTH:  03/27/1951  DATE OF PROCEDURE: DATE OF DISCHARGE:                              OPERATIVE REPORT   OPERATION:  Subxiphoid pericardial exposure for evacuation of pericardial hematoma.  SURGEON:  Kerin Perna, M.D.  ASSISTANT:  Rowe Clack, P.A.-C.  ANESTHESIA:  General.  PREOPERATIVE DIAGNOSIS:  Status post redo Bentall procedure with development of intrapericardial hematoma and evidence of right atrial compression.  POSTOPERATIVE DIAGNOSIS:  Status post redo Bentall procedure with development of intrapericardial hematoma and evidence of right atrial compression.  CLINICAL NOTE:  The patient is a 62 year old obese female who had undergone a Bentall procedure, root replacement with a mechanical valve conduit 10 years previously.  She returned with dilatation of the ascending aorta above the Bentall conduit and required replacement of her ascending aorta using circulatory arrest as well as coronary artery bypass grafting to the LAD for a high-grade left main stenosis.  She was started on her Coumadin on the 2nd postoperative day with careful dosing.  She did develop evidence of a widened mediastinum, but remained hemodynamically stable.  An initial 2D echo showed no evidence of pericardial blood, but a followup CT scan showed evidence of a hematoma along the right side of the cardiac structures with some compression of the right atrium, and she had some orthopnea symptoms associated with this finding.  Her Coumadin was held, and reexploration was recommended to evacuate hematoma.  A CTA of the thoracic aorta showed no evidence of false aneurysm or leak.  I had discussed the procedure with the patient including the possibility that we may have to open her entire sternal  incision and even use bypass if necessary.  She understood these issues and agreed to proceed with surgery and what I felt was an informed consent.  OPERATIVE PROCEDURE:  The patient was brought to the operating room, placed supine on the operating table where general anesthesia was induced.  The transesophageal echo probe was placed by the anesthesiologist.  This showed the collection of hematoma along the right side of the right atrium, SVC, and right ventricle.  The patient was prepped and draped as a sterile field.  A small incision was made from the lower port of the previous sternotomy and carried down to the rectus fascia.  The tip of the xiphoid was identified, and dissection was carried underneath the xiphoid into the pericardial space.  The Rultract retractor was used to elevate the lower sternum for exposure. Dissecting into the area of the pericardium, we encountered minimal bloody fluid, but some dark current jelly type of clot.  Careful dissection, irrigation, and extraction of the thrombus and clot removed a considerable amount of clot.  This was carried out as safely as was possible through a limited subxiphoid approach.  There was no active bleeding encountered.  All the clot removed was old and dark.  A 32- French Bard soft catheter was positioned into the cavity and brought out through a separate incision and secured to  the skin.  The transesophageal echo showed significant improvement in the collection along the right side of the heart after evacuation of hematoma.  The retractor was removed.  The deep fascia was closed with interrupted #1 Vicryl.  Subcutaneous was closed with running Vicryl, and skin was closed with a subcuticular.  The chest tube was connected to an underwater seal Pleur-Evac drainage system, and the patient was extubated and returned to recovery room in stable condition.     Kerin Perna, M.D.     PV/MEDQ  D:  11/21/2012  T:   11/22/2012  Job:  [id number]  cc:   Nanetta Batty, M.D.

## 2012-11-23 ENCOUNTER — Inpatient Hospital Stay (HOSPITAL_COMMUNITY): Payer: PRIVATE HEALTH INSURANCE

## 2012-11-23 LAB — COMPREHENSIVE METABOLIC PANEL
ALT: 13 U/L (ref 0–35)
Alkaline Phosphatase: 81 U/L (ref 39–117)
BUN: 12 mg/dL (ref 6–23)
CO2: 30 mEq/L (ref 19–32)
GFR calc Af Amer: >90 mL/min (ref >90)
GFR calc non Af Amer: >90 mL/min (ref >90)
Glucose, Bld: 114 mg/dL — ABNORMAL HIGH (ref 70–99)
Potassium: 3.6 mEq/L (ref 3.5–5.1)
Total Protein: 6.1 g/dL (ref 6.0–8.3)

## 2012-11-23 LAB — CBC
HCT: 29.3 % — ABNORMAL LOW (ref 36.0–46.0)
Hemoglobin: 9.7 g/dL — ABNORMAL LOW (ref 12.0–15.0)
MCH: 28.2 pg (ref 26.0–34.0)
MCHC: 33.1 g/dL (ref 30.0–36.0)
RBC: 3.44 MIL/uL — ABNORMAL LOW (ref 3.87–5.11)

## 2012-11-23 LAB — BASIC METABOLIC PANEL
Calcium: 9.3 mg/dL (ref 8.4–10.5)
Creatinine, Ser: 0.76 mg/dL (ref 0.50–1.10)
GFR calc non Af Amer: 90 mL/min — ABNORMAL LOW (ref >90)
Glucose, Bld: 115 mg/dL — ABNORMAL HIGH (ref 70–99)
Sodium: 137 mEq/L (ref 135–145)

## 2012-11-23 LAB — PROTIME-INR: INR: 2 — ABNORMAL HIGH (ref 0.00–1.49)

## 2012-11-23 MED ORDER — SODIUM CHLORIDE 0.9 % IV SOLN
INTRAVENOUS | Status: DC
  Administered 2012-11-23: 08:00:00 via INTRAVENOUS

## 2012-11-23 MED ORDER — FUROSEMIDE 10 MG/ML IJ SOLN
40.0000 mg | Freq: Two times a day (BID) | INTRAMUSCULAR | Status: DC
  Administered 2012-11-23: 20 mg via INTRAVENOUS
  Administered 2012-11-24: 40 mg via INTRAVENOUS
  Filled 2012-11-23 (×2): qty 4

## 2012-11-23 MED ORDER — POTASSIUM CHLORIDE 10 MEQ/50ML IV SOLN
10.0000 meq | INTRAVENOUS | Status: AC
  Administered 2012-11-23 (×3): 10 meq via INTRAVENOUS

## 2012-11-23 NOTE — Progress Notes (Signed)
Physical Therapy Treatment Patient Details Name: [name] MRN: [id number] DOB: 05-Oct-1950 Today's Date: 11/23/2012 Time: 8295-6213 PT Time Calculation (min): 23 min  PT Assessment / Plan / Recommendation Comments on Treatment Session  pt progressing well post pericardial window sx due to complications post CABG.  Still weak with decr activity tolerance and will benefit from PT to maximize Independence.    Follow Up Recommendations  Home health PT;Supervision - Intermittent;Supervision/Assistance - 24 hour (sons are work their schedules to be able to stay with Mom)     Does the patient have the potential to tolerate intense rehabilitation     Barriers to Discharge        Equipment Recommendations  None recommended by PT    Recommendations for Other Services    Frequency Min 3X/week   Plan Frequency remains appropriate;Discharge plan needs to be updated    Precautions / Restrictions Precautions Precautions: Fall;Sternal Restrictions Weight Bearing Restrictions: No   Pertinent Vitals/Pain Vitals stable of RA,  Pain at chest tube site--not rated    Mobility  Bed Mobility Bed Mobility: Not assessed Transfers Transfers: Sit to Stand;Stand to Sit Sit to Stand: 5: Supervision;Without upper extremity assist;From chair/3-in-1 Stand to Sit: 5: Supervision;Without upper extremity assist;To chair/3-in-1 Details for Transfer Assistance: follows sternal precautions well, safe mobility Ambulation/Gait Ambulation/Gait Assistance: 5: Supervision Ambulation Distance (Feet): 380 Feet Assistive device: Rolling walker Ambulation/Gait Assistance Details: generally steady though slow and deliberate with short step length Gait Pattern: Step-through pattern;Decreased step length - right;Decreased step length - left;Decreased stride length Gait velocity: decreased Stairs: No Wheelchair Mobility Wheelchair Mobility: No    Exercises     PT Diagnosis:    PT Problem List:   PT  Treatment Interventions:     PT Goals Acute Rehab PT Goals PT Goal Formulation: With patient Time For Goal Achievement: 12/07/12 Potential to Achieve Goals: Good Pt will go Supine/Side to Sit: Independently PT Goal: Supine/Side to Sit - Progress: Goal set today Pt will go Sit to Stand: Independently PT Goal: Sit to Stand - Progress: Goal set today Pt will Ambulate: >150 feet;with modified independence;with least restrictive assistive device PT Goal: Ambulate - Progress: Goal set today Additional Goals Additional Goal #1: Pt will independently state and demonstrate adherence to all sternal precaution with mobility PT Goal: Additional Goal #1 - Progress: Goal set today Additional Goal #2: Pt to score >19/24 on DGI to demonstrate improved balance.   PT Goal: Additional Goal #2 - Progress: Goal set today  Visit Information  Last PT Received On: 11/23/12 Assistance Needed: +1    Subjective Data  Subjective: Ive been in here almost a month, I'm ready to get home Patient Stated Goal: go home   Cognition  Cognition Overall Cognitive Status: Appears within functional limits for tasks assessed/performed Arousal/Alertness: Awake/alert Orientation Level: Appears intact for tasks assessed Behavior During Session: [facility] for tasks performed    Balance  Static Standing Balance Static Standing - Balance Support: During functional activity;Bilateral upper extremity supported Static Standing - Level of Assistance: 5: Stand by assistance  End of Session PT - End of Session Activity Tolerance: Patient tolerated treatment well Patient left: in chair;with call bell/phone within reach Nurse Communication: Mobility status   GP     Latiana Tomei, [name] 11/23/2012, 10:30 AM 11/23/2012  [place], PT [phone] (pager)

## 2012-11-23 NOTE — Progress Notes (Signed)
[facility] Progress Note  Subjective:  Day 2 s/p evacuation of pericardial hematoma; s/p re do Bentall procedure on 11/05/12  Objective:   Vital Signs in the last 24 hours: Temp:  [97.8 F (36.6 C)-98.2 F (36.8 C)] 98.2 F (36.8 C) (02/22 0742) Pulse Rate:  [69-93] 92 (02/22 1000) Resp:  [11-24] 19 (02/22 1000) BP: (97-136)/(46-85) 121/52 mmHg (02/22 1000) SpO2:  [92 %-100 %] 98 % (02/22 1000) Weight:  [76.068 kg (167 lb 11.2 oz)] 76.068 kg (167 lb 11.2 oz) (02/22 0500)  Intake/Output from previous day: 02/21 0701 - 02/22 0700 In: 1890 [P.O.:1060; I.V.:330; IV Piggyback:500] Out: 2485 [Urine:2435; Chest Tube:50]  Scheduled: . amiodarone  200 mg Oral BID  . aspirin EC  81 mg Oral Daily  . atorvastatin  40 mg Oral q1800  . bisacodyl  10 mg Oral Daily   Or  . bisacodyl  10 mg Rectal Daily  . ferrous fumarate-b12-vitamic C-folic acid  1 capsule Oral TID PC  . furosemide  20 mg Intravenous BID  . metoprolol tartrate  12.5 mg Oral BID  . pantoprazole  40 mg Oral Daily  . sodium chloride  10-40 mL Intracatheter Q12H   . sodium chloride 20 mL/hr at 11/23/12 1000  . dextrose 5 % and 0.9% NaCl 75 mL/hr at 11/21/12 1815    Physical Exam:   General appearance: alert, cooperative and no distress Neck: no carotid bruit, no JVD and supple, symmetrical, trachea midline Lungs: decreased BS at bases Heart: S1, S2 normal and 1-2 SEM aortic area Abdomen: soft, non-tender; bowel sounds normal; no masses,  no organomegaly Extremities: trace edema   Rate: 90  Rhythm: normal sinus rhythm  Lab Results:    Recent Labs  11/22/12 0337 11/23/12 0345  NA 139 140  K 4.1 3.6  CL 105 102  CO2 28 30  GLUCOSE 128* 114*  BUN 13 12  CREATININE 0.86 0.73   No results found for this basename: TROPONINI, CK, MB,  in the last 72 hours Hepatic Function Panel  Recent Labs  11/23/12 0345  PROT 6.1  ALBUMIN 2.8*  AST 18  ALT 13  ALKPHOS 81  BILITOT 0.7     Recent Labs  11/23/12 0345  INR 2.00*    Lipid Panel  No results found for this basename: chol, trig, hdl, cholhdl, vldl, ldlcalc     Imaging:  Dg Chest Port 1 View  11/23/2012  *RADIOLOGY REPORT*  Clinical Data: Follow up pulmonary status.  PORTABLE CHEST - 1 VIEW  Comparison: 11/22/2012.  Findings: The PICC line is stable.  The left IJ catheter has been removed.  There is a right-sided pericardial drainage catheter in place.  The cardiac silhouette, mediastinal and hilar contours are stable.  There are persistent pleural effusions, right greater than left with overlying atelectasis.  IMPRESSION:  1.  Pericardial drainage catheter and unchanged position. 2.  Removal of left IJ catheter. 3.  Stable small effusions and right lower lobe atelectasis.   Original Report Authenticated By: Rudie Meyer, M.D.    Dg Chest Port 1 View  11/22/2012  *RADIOLOGY REPORT*  Clinical Data: Pericardial window.  PORTABLE CHEST - 1 VIEW  Comparison: Plain film chest and CT chest 11/21/2012.  Findings: Left IJ catheter and pericardial window drainage catheter are again seen.  Contour abnormality along the right heart border consistent with hematoma as seen on prior CT appears unchanged. Right pleural effusion and basilar airspace disease appears slightly improved.  Left  basilar atelectasis has improved.  IMPRESSION:  1. Some improvement in right pleural effusion and basilar airspace disease. 2.  No change in contour abnormality along the right heart border consistent with hematoma seen on prior CT. 3.  Improved left basilar atelectasis.   Original Report Authenticated By: Holley Dexter, M.D.    Dg Chest Portable 1 View  11/21/2012  *RADIOLOGY REPORT*  Clinical Data: Hemopericardium.  PORTABLE CHEST - 1 VIEW  Comparison: Chest x-rays dated 02/20 and 11/18/2012 and chestCT dated 11/21/2012  Findings: Pericardial tube has been inserted.  PICC tip is at the cavoatrial junction.  New left jugular vein catheter tip  is in the left innominate vein.  No pneumothorax.  New slight left base atelectasis of left effusion.  Increasing right effusion.  Overall heart size and pulmonary vascularity are normal.  IMPRESSION:  1.  New central venous catheter tip is in the left innominate vein region. 2.  Slight increased right effusion.  New small left effusion. 3.  Decreased prominence of the right heart border after pericardial tube placement.   Original Report Authenticated By: Francene Boyers, M.D.       Assessment/Plan:   Principal Problem:   Acute pericardial effusion post op without tamponade - S/P subxiphoid pericaridal window 11/21/12 Active Problems:   Unstable angina on admission 11/04/12   CAD-severe LM disease- S/P CABG X 2 (SVG-OM, SVG-LAD) 11/05/12 (hx of Lt SCA stenosis)   S/P AVR, St Jude 2000   Dilated ascending aorta at cath 11/03/12 - S/P root replacement 11/05/12   Chronic anticoagulation for mechanical AVR   Contrast media allergy   HTN (hypertension)   Sarcoidosis   PVD , known Lt SCA stenosis   Pleural effusion, RT, post op   Good diuresis following IV lasix this am. Mild resisual CT drainage. INR 2.0 today, increased from 1.68 yesterday); last coumadin given on 2/18.   Lennette Bihari, MD, [facility] 11/23/2012, 10:42 AM

## 2012-11-24 ENCOUNTER — Inpatient Hospital Stay (HOSPITAL_COMMUNITY): Payer: PRIVATE HEALTH INSURANCE

## 2012-11-24 LAB — TYPE AND SCREEN: Unit division: 0

## 2012-11-24 LAB — BASIC METABOLIC PANEL
BUN: 9 mg/dL (ref 6–23)
CO2: 31 mEq/L (ref 19–32)
Calcium: 9.2 mg/dL (ref 8.4–10.5)
Chloride: 101 mEq/L (ref 96–112)
Creatinine, Ser: 0.81 mg/dL (ref 0.50–1.10)
GFR calc Af Amer: 90 mL/min — ABNORMAL LOW (ref >90)
GFR calc non Af Amer: 77 mL/min — ABNORMAL LOW (ref >90)
Glucose, Bld: 112 mg/dL — ABNORMAL HIGH (ref 70–99)
Potassium: 4.2 mEq/L (ref 3.5–5.1)
Sodium: 136 mEq/L (ref 135–145)

## 2012-11-24 LAB — CBC
HCT: 28.4 % — ABNORMAL LOW (ref 36.0–46.0)
Hemoglobin: 9.3 g/dL — ABNORMAL LOW (ref 12.0–15.0)
MCH: 27.7 pg (ref 26.0–34.0)
MCHC: 32.7 g/dL (ref 30.0–36.0)
MCV: 84.5 fL (ref 78.0–100.0)
Platelets: 414 10*3/uL — ABNORMAL HIGH (ref 150–400)
RBC: 3.36 MIL/uL — ABNORMAL LOW (ref 3.87–5.11)
RDW: 17.2 % — ABNORMAL HIGH (ref 11.5–15.5)
WBC: 10.5 10*3/uL (ref 4.0–10.5)

## 2012-11-24 LAB — PROTIME-INR: Prothrombin Time: 22.6 seconds — ABNORMAL HIGH (ref 11.6–15.2)

## 2012-11-24 MED ORDER — FUROSEMIDE 40 MG PO TABS
40.0000 mg | ORAL_TABLET | Freq: Every day | ORAL | Status: DC
  Administered 2012-11-25 – 2012-11-26 (×2): 40 mg via ORAL
  Filled 2012-11-24 (×2): qty 1

## 2012-11-24 MED ORDER — WARFARIN - PHYSICIAN DOSING INPATIENT: Freq: Every day | Status: DC

## 2012-11-24 MED ORDER — DEXTROSE-NACL 5-0.9 % IV SOLN
INTRAVENOUS | Status: DC
  Administered 2012-11-24: 20 mL via INTRAVENOUS

## 2012-11-24 MED ORDER — WARFARIN SODIUM 1 MG PO TABS
1.0000 mg | ORAL_TABLET | Freq: Every day | ORAL | Status: DC
  Filled 2012-11-24: qty 1

## 2012-11-24 MED ORDER — TRAMADOL HCL 50 MG PO TABS: 50.0000 mg | ORAL_TABLET | Freq: Four times a day (QID) | ORAL | Status: DC | PRN

## 2012-11-24 MED ORDER — FUROSEMIDE 10 MG/ML IJ SOLN: 40.0000 mg | Freq: Every day | INTRAMUSCULAR | Status: DC

## 2012-11-24 NOTE — Progress Notes (Signed)
5 in long x 3 in wide area of redness noted to sacral/inner gluteal area when patient admitted from 4700. All areas are blanchable at this point. Allevyn dressing applied prophylactically.

## 2012-11-24 NOTE — Discharge Summary (Signed)
patient examined and medical record reviewed,agree with above note. VAN TRIGT III,PETER 11/24/2012   

## 2012-11-24 NOTE — Progress Notes (Signed)
3 Days Post-Op Procedure(s) (LRB): PERICARDIAL WINDOW (N/A) INTRAOPERATIVE TRANSESOPHAGEAL ECHOCARDIOGRAM (N/A) REDO STERNOTOMY      PUMP STANDBY (N/A) Subjective: Postop evacuation of hematoma after redo ascending aneurysm repair Orthopnea much improved, chest tube drainage minimal, sinus rhythm INR remains 2.0 despite no Coumadin for several days, probably related to tamponade and affect on liver unction Chest x-ray shows improvement of right pleural effusion with diuresis   Objective: Vital signs in last 24 hours: Temp:  [98 F (36.7 C)-98.3 F (36.8 C)] 98.3 F (36.8 C) (02/23 0728) Pulse Rate:  [81-92] 87 (02/23 0600) Cardiac Rhythm:  [-] Normal sinus rhythm (02/23 0600) Resp:  [10-20] 20 (02/23 0600) BP: (94-128)/(42-60) 115/49 mmHg (02/23 0600) SpO2:  [89 %-98 %] 96 % (02/23 0600) Weight:  [166 lb 3.6 oz (75.4 kg)] 166 lb 3.6 oz (75.4 kg) (02/23 0600)  Hemodynamic parameters for last 24 hours:   sinus rhythm stable blood pressure  Intake/Output from previous day: 02/22 0701 - 02/23 0700 In: 1326 [P.O.:450; I.V.:470; IV Piggyback:406] Out: 2525 [Urine:2505; Chest Tube:20] Intake/Output this shift:    Breath sounds improved Sharp aortic valve closure click Edema improved  Lab Results:  Recent Labs  11/23/12 0345 11/24/12 0415  WBC 13.4* 10.5  HGB 9.7* 9.3*  HCT 29.3* 28.4*  PLT 451* 414*   BMET:  Recent Labs  11/23/12 1755 11/24/12 0415  NA 137 136  K 4.3 4.2  CL 100 101  CO2 29 31  GLUCOSE 115* 112*  BUN 10 9  CREATININE 0.76 0.81  CALCIUM 9.3 9.2    PT/INR:  Recent Labs  11/24/12 0415  LABPROT 22.6*  INR 2.09*   ABG    Component Value Date/Time   PHART 7.437 11/22/2012 0345   HCO3 26.9* 11/22/2012 0345   TCO2 28.1 11/22/2012 0345   ACIDBASEDEF 1.0 11/05/2012 1921   O2SAT 96.2 11/22/2012 0345   CBG (last 3)  No results found for this basename: GLUCAP,  in the last 72 hours  Assessment/Plan: S/P Procedure(s) (LRB): PERICARDIAL WINDOW  (N/A) INTRAOPERATIVE TRANSESOPHAGEAL ECHOCARDIOGRAM (N/A) REDO STERNOTOMY      PUMP STANDBY (N/A) Keep in ICU Start low-dose Coumadin tomorrow 1 mg daily for St. Jude valve Remove chest tube  LOS: 20 days    VAN TRIGT III,PETER 11/24/2012

## 2012-11-24 NOTE — Progress Notes (Signed)
Mediastinal tube removed per hospital policy after patient dangled. Tolerated well and states that her pain is 'much better.' Will continue to monitor.

## 2012-11-24 NOTE — Progress Notes (Signed)
[facility] Progress Note  Subjective:  Feels better  Objective:   Vital Signs in the last 24 hours: Temp:  [98 F (36.7 C)-98.3 F (36.8 C)] 98.3 F (36.8 C) (02/23 0728) Pulse Rate:  [81-91] 91 (02/23 1000) Resp:  [10-21] 21 (02/23 1000) BP: (83-128)/(42-65) 118/47 mmHg (02/23 1000) SpO2:  [89 %-98 %] 96 % (02/23 1000) Weight:  [75.4 kg (166 lb 3.6 oz)] 75.4 kg (166 lb 3.6 oz) (02/23 0600)  Intake/Output from previous day: 02/22 0701 - 02/23 0700 In: 1326 [P.O.:450; I.V.:470; IV Piggyback:406] Out: 2650 [Urine:2630; Chest Tube:20]  Scheduled: . amiodarone  200 mg Oral BID  . aspirin EC  81 mg Oral Daily  . atorvastatin  40 mg Oral q1800  . bisacodyl  10 mg Oral Daily   Or  . bisacodyl  10 mg Rectal Daily  . ferrous fumarate-b12-vitamic C-folic acid  1 capsule Oral TID PC  . [START ON 11/25/2012] furosemide  40 mg Oral Daily  . metoprolol tartrate  12.5 mg Oral BID  . pantoprazole  40 mg Oral Daily  . sodium chloride  10-40 mL Intracatheter Q12H  . [START ON 11/25/2012] warfarin  1 mg Oral q1800  . Warfarin - Physician Dosing Inpatient   Does not apply q1800   . sodium chloride 20 mL/hr at 11/24/12 1000  . dextrose 5 % and 0.9% NaCl      Physical Exam:   General appearance: alert, cooperative and no distress Neck: no adenopathy, no JVD and supple, symmetrical, trachea midline Lungs: clear to auscultation bilaterally Heart: S1, S2 normal and 2/6 sem Abdomen: minimal tenderness; BS+ Extremities: no edema, redness or tenderness in the calves or thighs   Rate: 80  Rhythm: normal sinus rhythm  Lab Results:    Recent Labs  11/23/12 1755 11/24/12 0415  NA 137 136  K 4.3 4.2  CL 100 101  CO2 29 31  GLUCOSE 115* 112*  BUN 10 9  CREATININE 0.76 0.81   No results found for this basename: TROPONINI, CK, MB,  in the last 72 hours Hepatic Function Panel  Recent Labs  11/23/12 0345  PROT 6.1  ALBUMIN 2.8*  AST 18  ALT 13   ALKPHOS 81  BILITOT 0.7    Recent Labs  11/24/12 0415  INR 2.09*    Lipid Panel  No results found for this basename: chol, trig, hdl, cholhdl, vldl, ldlcalc     Imaging:  Dg Chest 2 View  11/24/2012  *RADIOLOGY REPORT*  Clinical Data: Status post evacuation of pericardial hematoma after recent ascending thoracic aortic replacement.  CHEST - 2 VIEW  Comparison: 11/23/2012  Findings: Improved expansion and aeration of the right lung with some residual basilar atelectasis present as well as a small pleural effusion.  Minimal atelectasis is present at the left lung base.  No pneumothorax or pulmonary edema is identified.  Cardiac and mediastinal contours are stable and show no widening.  IMPRESSION: Improving aeration of the right lung.   Original Report Authenticated By: Irish Lack, M.D.    Dg Chest Port 1 View  11/23/2012  *RADIOLOGY REPORT*  Clinical Data: Follow up pulmonary status.  PORTABLE CHEST - 1 VIEW  Comparison: 11/22/2012.  Findings: The PICC line is stable.  The left IJ catheter has been removed.  There is a right-sided pericardial drainage catheter in place.  The cardiac silhouette, mediastinal and hilar contours are stable.  There are persistent pleural effusions, right greater than left with overlying  atelectasis.  IMPRESSION:  1.  Pericardial drainage catheter and unchanged position. 2.  Removal of left IJ catheter. 3.  Stable small effusions and right lower lobe atelectasis.   Original Report Authenticated By: Rudie Meyer, M.D.       Assessment/Plan:   Principal Problem:   Acute pericardial effusion post op without tamponade - S/P subxiphoid pericaridal window 11/21/12 Active Problems:   Unstable angina on admission 11/04/12   CAD-severe LM disease- S/P CABG X 2 (SVG-OM, SVG-LAD) 11/05/12 (hx of Lt SCA stenosis)   S/P AVR, St Jude 2000   Dilated ascending aorta at cath 11/03/12 - S/P root replacement 11/05/12   Chronic anticoagulation for mechanical AVR   Contrast  media allergy   HTN (hypertension)   Sarcoidosis   PVD , known Lt SCA stenosis   Pleural effusion, RT, post op  INR continues to increase slightly, up to 2.09 today, last dose of coumadin on 2/18.  Will check LFT's. Continues to diurese with weight 75.4 today down from 78.9 on 2/21 and 76.1 on 2/22. CT out this am; no bleeding.  To restart coumadin tomorrow for St. Jude valve.    Lennette Bihari, MD, [facility] 11/24/2012, 11:37 AM

## 2012-11-25 ENCOUNTER — Inpatient Hospital Stay (HOSPITAL_COMMUNITY): Payer: PRIVATE HEALTH INSURANCE

## 2012-11-25 LAB — CBC
HCT: 28 % — ABNORMAL LOW (ref 36.0–46.0)
Hemoglobin: 9.1 g/dL — ABNORMAL LOW (ref 12.0–15.0)
MCH: 27.6 pg (ref 26.0–34.0)
MCHC: 32.5 g/dL (ref 30.0–36.0)
MCV: 84.8 fL (ref 78.0–100.0)
Platelets: 375 10*3/uL (ref 150–400)
RBC: 3.3 MIL/uL — ABNORMAL LOW (ref 3.87–5.11)
RDW: 17.2 % — ABNORMAL HIGH (ref 11.5–15.5)
WBC: 8.2 10*3/uL (ref 4.0–10.5)

## 2012-11-25 LAB — BASIC METABOLIC PANEL
BUN: 10 mg/dL (ref 6–23)
CO2: 31 mEq/L (ref 19–32)
Calcium: 8.9 mg/dL (ref 8.4–10.5)
Chloride: 99 mEq/L (ref 96–112)
Creatinine, Ser: 0.81 mg/dL (ref 0.50–1.10)
GFR calc Af Amer: 90 mL/min — ABNORMAL LOW (ref >90)
GFR calc non Af Amer: 77 mL/min — ABNORMAL LOW (ref >90)
Glucose, Bld: 112 mg/dL — ABNORMAL HIGH (ref 70–99)
Potassium: 3.8 mEq/L (ref 3.5–5.1)
Sodium: 136 mEq/L (ref 135–145)

## 2012-11-25 LAB — PROTIME-INR
INR: 2.08 — ABNORMAL HIGH (ref 0.00–1.49)
Prothrombin Time: 22.5 seconds — ABNORMAL HIGH (ref 11.6–15.2)

## 2012-11-25 MED ORDER — WARFARIN SODIUM 1 MG PO TABS
1.0000 mg | ORAL_TABLET | Freq: Every day | ORAL | Status: DC
  Filled 2012-11-25: qty 1

## 2012-11-25 MED ORDER — SODIUM CHLORIDE 0.9 % IJ SOLN
10.0000 mL | INTRAMUSCULAR | Status: DC | PRN
  Administered 2012-11-25: 20 mL
  Administered 2012-11-26: 10 mL

## 2012-11-25 NOTE — Progress Notes (Signed)
Physical Therapy Treatment Patient Details Name: [name] MRN: [id number] DOB: 1951/05/24 Today's Date: 11/25/2012 Time: 4098-1191 PT Time Calculation (min): 32 min  PT Assessment / Plan / Recommendation Comments on Treatment Session  Pt s/p CABG with post op pericardial effusions resulting in pericardial window end of last week.  Progressing and close to d/c now.  Will need HHPT, RW and 3N1.      Follow Up Recommendations  Home health PT;Supervision - Intermittent                 Equipment Recommendations  Rolling walker with 5" wheels;Other (comment) (3N1)       Frequency Min 3X/week   Plan Discharge plan remains appropriate;Frequency remains appropriate    Precautions / Restrictions Precautions Precautions: Sternal;Fall Restrictions Weight Bearing Restrictions: No   Pertinent Vitals/Pain VSS, No pain    Mobility  Bed Mobility Bed Mobility: Rolling Left;Left Sidelying to Sit Rolling Left: 7: Independent Left Sidelying to Sit: 7: Independent;HOB flat Transfers Transfers: Sit to Stand;Stand to Sit Sit to Stand: 6: Modified independent (Device/Increase time);Without upper extremity assist;From bed Stand to Sit: 6: Modified independent (Device/Increase time);Without upper extremity assist;To bed Details for Transfer Assistance: Follows sternal precautions without cueing. Ambulation/Gait Ambulation/Gait Assistance: 5: Supervision Ambulation Distance (Feet): 300 Feet Assistive device: None Ambulation/Gait Assistance Details: Occasional staggering gait without RW which pt self corrected.  Slow and deliberate gait continues.  Pt requests that she get a RW at home and this PT agrees. Gait Pattern: Step-through pattern;Decreased step length - right;Decreased step length - left;Decreased stride length Gait velocity: decreased Stairs: No Wheelchair Mobility Wheelchair Mobility: No     PT Goals Acute Rehab PT Goals PT Goal Formulation: With patient PT Goal:  Supine/Side to Sit - Progress: Progressing toward goal PT Goal: Sit to Stand - Progress: Progressing toward goal PT Goal: Ambulate - Progress: Progressing toward goal Additional Goals PT Goal: Additional Goal #1 - Progress: Progressing toward goal PT Goal: Additional Goal #2 - Progress: Progressing toward goal  Visit Information  Last PT Received On: 11/25/12 Assistance Needed: +1    Subjective Data  Subjective: "I want to get out of here."   Cognition  Cognition Overall Cognitive Status: Appears within functional limits for tasks assessed/performed Arousal/Alertness: Awake/alert Orientation Level: Appears intact for tasks assessed Behavior During Session: [facility] for tasks performed    Balance  Static Standing Balance Static Standing - Balance Support: Bilateral upper extremity supported;During functional activity Static Standing - Level of Assistance: 5: Stand by assistance Static Standing - Comment/# of Minutes: 3 minutes Dynamic Gait Index Level Surface: Normal Change in Gait Speed: Normal Gait with Horizontal Head Turns: Mild Impairment Gait with Vertical Head Turns: Mild Impairment Gait and Pivot Turn: Mild Impairment Step Over Obstacle: Normal Step Around Obstacles: Mild Impairment Steps: Mild Impairment Total Score: 19 High Level Balance High Level Balance Comments: Pt improved on DGI from a week ago.  Still needs RW for safety as she scored 19/24 which still suggests risk of falls.    End of Session PT - End of Session Equipment Utilized During Treatment: Gait belt Activity Tolerance: Patient tolerated treatment well Patient left: in bed;with call bell/phone within reach Nurse Communication: Mobility status        INGOLD,Adaja Wander 11/25/2012, 1:35 PM  [facility] Acute Rehabilitation [phone] (pager)

## 2012-11-25 NOTE — Progress Notes (Signed)
4 Days Post-Op Procedure(s) (LRB): PERICARDIAL WINDOW (N/A) INTRAOPERATIVE TRANSESOPHAGEAL ECHOCARDIOGRAM (N/A) REDO STERNOTOMY      PUMP STANDBY (N/A) Subjective: Evacuation of mediastinal hematoma- postop tamponade NSR  No complaints of orthopnea  Objective: Vital signs in last 24 hours: Temp:  [98.2 F (36.8 C)-98.6 F (37 C)] 98.2 F (36.8 C) (02/24 0755) Pulse Rate:  [73-93] 92 (02/24 0600) Cardiac Rhythm:  [-] Normal sinus rhythm (02/24 0600) Resp:  [13-25] 19 (02/24 0600) BP: (89-118)/(37-78) 105/48 mmHg (02/24 0600) SpO2:  [94 %-100 %] 98 % (02/24 0600) Weight:  [165 lb 2 oz (74.9 kg)] 165 lb 2 oz (74.9 kg) (02/24 0600)  Hemodynamic parameters for last 24 hours:  NSR  Intake/Output from previous day: 02/23 0701 - 02/24 0700 In: 460 [I.V.:460] Out: 1270 [Urine:1250; Chest Tube:20] Intake/Output this shift:    Incisions clean,dry                         EXAM Warm extrem  Lab Results:  Recent Labs  11/24/12 0415 11/25/12 0342  WBC 10.5 8.2  HGB 9.3* 9.1*  HCT 28.4* 28.0*  PLT 414* 375   BMET:  Recent Labs  11/24/12 0415 11/25/12 0342  NA 136 136  K 4.2 3.8  CL 101 99  CO2 31 31  GLUCOSE 112* 112*  BUN 9 10  CREATININE 0.81 0.81  CALCIUM 9.2 8.9    PT/INR:  Recent Labs  11/25/12 0342  LABPROT 22.5*  INR 2.08*   ABG    Component Value Date/Time   PHART 7.437 11/22/2012 0345   HCO3 26.9* 11/22/2012 0345   TCO2 28.1 11/22/2012 0345   ACIDBASEDEF 1.0 11/05/2012 1921   O2SAT 96.2 11/22/2012 0345   CBG (last 3)  No results found for this basename: GLUCAP,  in the last 72 hours  Assessment/Plan: S/P Procedure(s) (LRB): PERICARDIAL WINDOW (N/A) INTRAOPERATIVE TRANSESOPHAGEAL ECHOCARDIOGRAM (N/A) REDO STERNOTOMY      PUMP STANDBY (N/A) Plan for transfer to step-down: see transfer orders Hold coumadin for INR 2.0  LOS: 21 days    [name],[name] 11/25/2012

## 2012-11-25 NOTE — Progress Notes (Signed)
CARDIAC REHAB PHASE I   PRE:  Rate/Rhythm: 93 SR  BP:  Supine:   Sitting: 110/50  Standing:    SaO2: 9        3 RA  MODE:  Ambulation: 550 ft   POST:  Rate/Rhythem: 102 ST  BP:  Supine:   Sitting: 120/50  Standing:    SaO2: 92 RA 1350-1420 Assisted X 1 to ambulate with hand held assist. Gait steady VS stable. Pt able to walk 550 feet without c/o. Pt back to bed after walk with call light in reach.  Beatrix Fetters

## 2012-11-25 NOTE — Progress Notes (Signed)
[facility]  DAILY PROGRESS NOTE   Subjective:  No events overnight. Transferred to stepdown.   Objective:  Temp:  [98.2 F (36.8 C)-98.6 F (37 C)] 98.6 F (37 C) (02/24 1006) Pulse Rate:  [71-93] 71 (02/24 1006) Resp:  [13-23] 16 (02/24 1006) BP: (89-117)/(37-78) 106/62 mmHg (02/24 1006) SpO2:  [94 %-100 %] 95 % (02/24 1006) Weight:  [74.9 kg (165 lb 2 oz)] 74.9 kg (165 lb 2 oz) (02/24 0600) Weight change: -0.5 kg (-1 lb 1.6 oz)  Intake/Output from previous day: 02/23 0701 - 02/24 0700 In: 460 [I.V.:460] Out: 1270 [Urine:1250; Chest Tube:20]  Intake/Output from this shift: Total I/O In: 140 [P.O.:120; I.V.:20] Out: -   Medications: Current Facility-Administered Medications  Medication Dose Route Frequency Provider Last Rate Last Dose  . 0.9 %  sodium chloride infusion   Intravenous Continuous Kerin Perna, MD 20 mL/hr at 11/24/12 1600    . acetaminophen (TYLENOL) tablet 650 mg  650 mg Oral Q6H PRN Kerin Perna, MD   650 mg at 11/19/12 0502  . amiodarone (PACERONE) tablet 200 mg  200 mg Oral BID Kerin Perna, MD   200 mg at 11/25/12 1610  . aspirin EC tablet 81 mg  81 mg Oral Daily Kerin Perna, MD   81 mg at 11/25/12 9604  . atorvastatin (LIPITOR) tablet 40 mg  40 mg Oral q1800 Rowe Clack, PA   40 mg at 11/24/12 1813  . bisacodyl (DULCOLAX) EC tablet 10 mg  10 mg Oral Daily Rowe Clack, PA   10 mg at 11/25/12 0930   Or  . bisacodyl (DULCOLAX) suppository 10 mg  10 mg Rectal Daily Rowe Clack, PA      . bisacodyl (DULCOLAX) EC tablet 10 mg  10 mg Oral Daily PRN Kerin Perna, MD   10 mg at 11/16/12 2212   Or  . bisacodyl (DULCOLAX) suppository 10 mg  10 mg Rectal Daily PRN Kerin Perna, MD      . ferrous fumarate-b12-vitamic C-folic acid (TRINSICON / FOLTRIN) capsule 1 capsule  1 capsule Oral TID PC Kerin Perna, MD   1 capsule at 11/25/12 0856  . furosemide (LASIX) tablet 40 mg  40 mg Oral Daily Kerin Perna, MD    40 mg at 11/25/12 0930  . guaiFENesin (MUCINEX) 12 hr tablet 600 mg  600 mg Oral Q12H PRN Kerin Perna, MD      . labetalol (NORMODYNE,TRANDATE) injection 10 mg  10 mg Intravenous Q2H PRN Kerin Perna, MD      . magnesium hydroxide (MILK OF MAGNESIA) suspension 30 mL  30 mL Oral Daily PRN Kerin Perna, MD      . metoprolol tartrate (LOPRESSOR) tablet 12.5 mg  12.5 mg Oral BID Kerin Perna, MD   12.5 mg at 11/25/12 0932  . naphazoline-pheniramine (NAPHCON-A) 0.025-0.3 % ophthalmic solution 1 drop  1 drop Both Eyes QID PRN Rowe Clack, PA      . ondansetron [facility]) injection 4 mg  4 mg Intravenous Q6H PRN Rowe Clack, PA   4 mg at 11/23/12 1954  . oxyCODONE-acetaminophen (PERCOCET/ROXICET) 5-325 MG per tablet 1-2 tablet  1-2 tablet Oral Q4H PRN Rowe Clack, PA      . pantoprazole (PROTONIX) EC tablet 40 mg  40 mg Oral Daily Rowe Clack, PA   40 mg at 11/25/12 0933  . potassium chloride 10 mEq in 50  mL *CENTRAL LINE* IVPB  10 mEq Intravenous Daily PRN Rowe Clack, PA   10 mEq at 11/23/12 0759  . sodium chloride 0.9 % injection 10-40 mL  10-40 mL Intracatheter Q12H Kerin Perna, MD   10 mL at 11/25/12 0933  . traMADol (ULTRAM) tablet 50 mg  50 mg Oral Q6H PRN Kerin Perna, MD   50 mg at 11/22/12 0953  . [START ON 11/26/2012] warfarin (COUMADIN) tablet 1 mg  1 mg Oral q1800 Kerin Perna, MD      . Warfarin - Physician Dosing Inpatient   Does not apply q1800 Kerin Perna, MD        Physical Exam: General appearance: alert and no distress Neck: no adenopathy, no carotid bruit, no JVD, supple, symmetrical, trachea midline and thyroid not enlarged, symmetric, no tenderness/mass/nodules Lungs: clear to auscultation bilaterally Heart: regular rate and rhythm, S1, S2 normal, no murmur, click, rub or gallop Abdomen: soft, non-tender; bowel sounds normal; no masses,  no organomegaly Extremities: extremities normal, atraumatic, no cyanosis or edema Pulses: 2+ and symmetric Skin:  Skin color, texture, turgor normal. No rashes or lesions Neurologic: Grossly normal  Lab Results: Results for orders placed during the hospital encounter of 11/04/12 (from the past 48 hour(s))  BASIC METABOLIC PANEL     Status: Abnormal   Collection Time    11/23/12  5:55 PM      Result Value Range   Sodium 137  135 - 145 mEq/L   Potassium 4.3  3.5 - 5.1 mEq/L   Chloride 100  96 - 112 mEq/L   CO2 29  19 - 32 mEq/L   Glucose, Bld 115 (*) 70 - 99 mg/dL   BUN 10  6 - 23 mg/dL   Creatinine, Ser 1.61  0.50 - 1.10 mg/dL   Calcium 9.3  8.4 - 09.6 mg/dL   GFR calc non Af Amer 90 (*) >90 mL/min   GFR calc Af Amer >90  >90 mL/min   Comment:            The eGFR has been calculated     using the CKD EPI equation.     This calculation has not been     validated in all clinical     situations.     eGFR's persistently     <90 mL/min signify     possible Chronic Kidney Disease.  PROTIME-INR     Status: Abnormal   Collection Time    11/24/12  4:15 AM      Result Value Range   Prothrombin Time 22.6 (*) 11.6 - 15.2 seconds   INR 2.09 (*) 0.00 - 1.49  BASIC METABOLIC PANEL     Status: Abnormal   Collection Time    11/24/12  4:15 AM      Result Value Range   Sodium 136  135 - 145 mEq/L   Potassium 4.2  3.5 - 5.1 mEq/L   Chloride 101  96 - 112 mEq/L   CO2 31  19 - 32 mEq/L   Glucose, Bld 112 (*) 70 - 99 mg/dL   BUN 9  6 - 23 mg/dL   Creatinine, Ser 0.45  0.50 - 1.10 mg/dL   Calcium 9.2  8.4 - 40.9 mg/dL   GFR calc non Af Amer 77 (*) >90 mL/min   GFR calc Af Amer 90 (*) >90 mL/min   Comment:            The eGFR has been  calculated     using the CKD EPI equation.     This calculation has not been     validated in all clinical     situations.     eGFR's persistently     <90 mL/min signify     possible Chronic Kidney Disease.  CBC     Status: Abnormal   Collection Time    11/24/12  4:15 AM      Result Value Range   WBC 10.5  4.0 - 10.5 K/uL   RBC 3.36 (*) 3.87 - 5.11 MIL/uL    Hemoglobin 9.3 (*) 12.0 - 15.0 g/dL   HCT 16.1 (*) 09.6 - 04.5 %   MCV 84.5  78.0 - 100.0 fL   MCH 27.7  26.0 - 34.0 pg   MCHC 32.7  30.0 - 36.0 g/dL   RDW 40.9 (*) 81.1 - 91.4 %   Platelets 414 (*) 150 - 400 K/uL  PROTIME-INR     Status: Abnormal   Collection Time    11/25/12  3:42 AM      Result Value Range   Prothrombin Time 22.5 (*) 11.6 - 15.2 seconds   INR 2.08 (*) 0.00 - 1.49  CBC     Status: Abnormal   Collection Time    11/25/12  3:42 AM      Result Value Range   WBC 8.2  4.0 - 10.5 K/uL   RBC 3.30 (*) 3.87 - 5.11 MIL/uL   Hemoglobin 9.1 (*) 12.0 - 15.0 g/dL   HCT 78.2 (*) 95.6 - 21.3 %   MCV 84.8  78.0 - 100.0 fL   MCH 27.6  26.0 - 34.0 pg   MCHC 32.5  30.0 - 36.0 g/dL   RDW 08.6 (*) 57.8 - 46.9 %   Platelets 375  150 - 400 K/uL  BASIC METABOLIC PANEL     Status: Abnormal   Collection Time    11/25/12  3:42 AM      Result Value Range   Sodium 136  135 - 145 mEq/L   Potassium 3.8  3.5 - 5.1 mEq/L   Chloride 99  96 - 112 mEq/L   CO2 31  19 - 32 mEq/L   Glucose, Bld 112 (*) 70 - 99 mg/dL   BUN 10  6 - 23 mg/dL   Creatinine, Ser 6.29  0.50 - 1.10 mg/dL   Calcium 8.9  8.4 - 52.8 mg/dL   GFR calc non Af Amer 77 (*) >90 mL/min   GFR calc Af Amer 90 (*) >90 mL/min   Comment:            The eGFR has been calculated     using the CKD EPI equation.     This calculation has not been     validated in all clinical     situations.     eGFR's persistently     <90 mL/min signify     possible Chronic Kidney Disease.    Imaging: Dg Chest 2 View  11/25/2012  *RADIOLOGY REPORT*  Clinical Data: Shortness of breath.  Right pleural effusion. Postop from CABG.  CHEST - 2 VIEW  Comparison: 11/24/2012  Findings: A small to moderate right pleural effusion shows no significant change.  No pneumothorax identified.  Right lower lobe atelectasis is also stable.  Left lung is clear.  Mild cardiomegaly and tortuosity thoracic aorta is unchanged.  Left arm PICC line remains in stable  position.  IMPRESSION: Stable right pleural effusion and  right lower lung atelectasis.  No pneumothorax identified.   Original Report Authenticated By: Myles Rosenthal, M.D.    Dg Chest 2 View  11/24/2012  *RADIOLOGY REPORT*  Clinical Data: Status post evacuation of pericardial hematoma after recent ascending thoracic aortic replacement.  CHEST - 2 VIEW  Comparison: 11/23/2012  Findings: Improved expansion and aeration of the right lung with some residual basilar atelectasis present as well as a small pleural effusion.  Minimal atelectasis is present at the left lung base.  No pneumothorax or pulmonary edema is identified.  Cardiac and mediastinal contours are stable and show no widening.  IMPRESSION: Improving aeration of the right lung.   Original Report Authenticated By: Irish Lack, M.D.     Assessment:  1. Principal Problem: 2.   Acute pericardial effusion post op without tamponade - S/P subxiphoid pericaridal window 11/21/12 3. Active Problems: 4.   Unstable angina on admission 11/04/12 5.   CAD-severe LM disease- S/P CABG X 2 (SVG-OM, SVG-LAD) 11/05/12 (hx of Lt SCA stenosis) 6.   S/P AVR, [employer] Jude 2000 7.   Dilated ascending aorta at cath 11/03/12 - S/P root replacement 11/05/12 8.   Chronic anticoagulation for mechanical AVR 9.   Contrast media allergy 10.   HTN (hypertension) 11.   Sarcoidosis 12.   PVD , known Lt SCA stenosis 13.   Pleural effusion, RT, post op 14.   Plan:  1. HCT stable. INR 2.08 on warfarin - mechanical AVR. Breathing has improved.  On amiodarone 200 mg BID. BP will not tolerate ACE-I at this time.   Time Spent Directly with Patient:  15 minutes  Length of Stay:  LOS: 21 days   Chrystie Nose, MD, [facility] Attending Cardiologist [facility]  Aubrianne Molyneux C 11/25/2012, 12:31 PM

## 2012-11-26 LAB — COMPREHENSIVE METABOLIC PANEL
ALT: 12 U/L (ref 0–35)
AST: 17 U/L (ref 0–37)
Albumin: 3.1 g/dL — ABNORMAL LOW (ref 3.5–5.2)
Alkaline Phosphatase: 77 U/L (ref 39–117)
BUN: 9 mg/dL (ref 6–23)
CO2: 32 mEq/L (ref 19–32)
Calcium: 9.6 mg/dL (ref 8.4–10.5)
Chloride: 100 mEq/L (ref 96–112)
Creatinine, Ser: 0.8 mg/dL (ref 0.50–1.10)
GFR calc Af Amer: >90 mL/min (ref >90)
GFR calc non Af Amer: 79 mL/min — ABNORMAL LOW (ref >90)
Glucose, Bld: 111 mg/dL — ABNORMAL HIGH (ref 70–99)
Potassium: 3.4 mEq/L — ABNORMAL LOW (ref 3.5–5.1)
Sodium: 141 mEq/L (ref 135–145)
Total Bilirubin: 1.2 mg/dL (ref 0.3–1.2)
Total Protein: 6.4 g/dL (ref 6.0–8.3)

## 2012-11-26 LAB — CBC
HCT: 30 % — ABNORMAL LOW (ref 36.0–46.0)
Hemoglobin: 9.8 g/dL — ABNORMAL LOW (ref 12.0–15.0)
MCH: 27.2 pg (ref 26.0–34.0)
MCHC: 32.7 g/dL (ref 30.0–36.0)
MCV: 83.3 fL (ref 78.0–100.0)
Platelets: 434 10*3/uL — ABNORMAL HIGH (ref 150–400)
RBC: 3.6 MIL/uL — ABNORMAL LOW (ref 3.87–5.11)
RDW: 16.8 % — ABNORMAL HIGH (ref 11.5–15.5)
WBC: 7.4 10*3/uL (ref 4.0–10.5)

## 2012-11-26 MED ORDER — WARFARIN SODIUM 2 MG PO TABS: 2.0000 mg | ORAL_TABLET | Freq: Every day | ORAL | Status: DC

## 2012-11-26 NOTE — Progress Notes (Addendum)
Pt discharged per MD order and protocol. All discharge instructions reviewed with patient and questions answered. Pt aware of all follow up appointments.   Martyn Malay, RN

## 2012-11-26 NOTE — Progress Notes (Signed)
CARDIAC REHAB PHASE I   PRE:  Rate/Rhythm: 91 SR  BP:  Supine:   Sitting: 108/50  Standing:    SaO2: 100 RA  MODE:  Ambulation: 890 ft   POST:  Rate/Rhythem: 101  BP:  Supine:   Sitting: 110/50  Standing:    SaO2: 93RA 0900- 0945 Pt tolerated ambulation well without c/o. Gait steady with hand held assist. VS stable. Pt back to chair after walk with call light in reach. Reviewed discharge education with pt. She voices understanding.  [name]

## 2012-11-26 NOTE — Progress Notes (Signed)
5 Days Post-Op Procedure(s) (LRB): PERICARDIAL WINDOW (N/A) INTRAOPERATIVE TRANSESOPHAGEAL ECHOCARDIOGRAM (N/A) REDO STERNOTOMY      PUMP STANDBY (N/A) Subjective:  Patient is without complaints.  She is hopeful she can be discharged today.   Objective: Vital signs in last 24 hours: Temp:  [98.1 F (36.7 C)-98.6 F (37 C)] 98.2 F (36.8 C) (02/25 0512) Pulse Rate:  [71-91] 86 (02/25 0512) Cardiac Rhythm:  [-] Normal sinus rhythm (02/25 0800) Resp:  [16-18] 18 (02/25 0512) BP: (105-114)/(39-62) 106/39 mmHg (02/25 0514) SpO2:  [95 %-100 %] 98 % (02/25 0512) Weight:  [160 lb 7.9 oz (72.8 kg)] 160 lb 7.9 oz (72.8 kg) (02/25 0651)  Intake/Output from previous day: 02/24 0701 - 02/25 0700 In: 260 [P.O.:240; I.V.:20] Out: 850 [Urine:850]  General appearance: alert, cooperative and no distress Neurologic: intact Heart: regular rate and rhythm Lungs: clear to auscultation bilaterally Abdomen: soft, non-tender; bowel sounds normal; no masses,  no organomegaly Extremities: extremities normal, atraumatic, no cyanosis or edema Wound: clean and dry  Lab Results:  Recent Labs  11/25/12 0342 11/26/12 0445  WBC 8.2 7.4  HGB 9.1* 9.8*  HCT 28.0* 30.0*  PLT 375 434*   BMET:  Recent Labs  11/25/12 0342 11/26/12 0445  NA 136 141  K 3.8 3.4*  CL 99 100  CO2 31 32  GLUCOSE 112* 111*  BUN 10 9  CREATININE 0.81 0.80  CALCIUM 8.9 9.6    PT/INR:  Recent Labs  11/26/12 0445  LABPROT 20.9*  INR 1.88*   ABG    Component Value Date/Time   PHART 7.437 11/22/2012 0345   HCO3 26.9* 11/22/2012 0345   TCO2 28.1 11/22/2012 0345   ACIDBASEDEF 1.0 11/05/2012 1921   O2SAT 96.2 11/22/2012 0345   CBG (last 3)  No results found for this basename: GLUCAP,  in the last 72 hours  Assessment/Plan: S/P Procedure(s) (LRB): PERICARDIAL WINDOW (N/A) INTRAOPERATIVE TRANSESOPHAGEAL ECHOCARDIOGRAM (N/A) REDO STERNOTOMY      PUMP STANDBY (N/A)  1. CV- NSR on Amiodarone, Lopressor 2. INR 1.88-  will restart 2.0 mg tomorrow 3. Dispo- patient doing well, will d/c home today with home health  LOS: 22 days    Raford Pitcher, Denny Peon 11/26/2012

## 2012-12-04 ENCOUNTER — Ambulatory Visit: Payer: PRIVATE HEALTH INSURANCE | Admitting: Cardiothoracic Surgery

## 2012-12-04 ENCOUNTER — Other Ambulatory Visit: Payer: Self-pay | Admitting: *Deleted

## 2012-12-04 DIAGNOSIS — I2510 Atherosclerotic heart disease of native coronary artery without angina pectoris: Secondary | ICD-10-CM

## 2012-12-09 ENCOUNTER — Ambulatory Visit (INDEPENDENT_AMBULATORY_CARE_PROVIDER_SITE_OTHER): Payer: Self-pay | Admitting: Physician Assistant

## 2012-12-09 ENCOUNTER — Ambulatory Visit
Admission: RE | Admit: 2012-12-09 | Discharge: 2012-12-09 | Disposition: A | Discharge status: Home / Self Care | Payer: PRIVATE HEALTH INSURANCE | Source: Ambulatory Visit | Attending: Cardiothoracic Surgery | Admitting: Cardiothoracic Surgery

## 2012-12-09 VITALS — BP 135/72 | HR 86 | Resp 20 | Ht 67.0 in | Wt 157.0 lb

## 2012-12-09 DIAGNOSIS — Z8679 Personal history of other diseases of the circulatory system: Secondary | ICD-10-CM

## 2012-12-09 DIAGNOSIS — I2510 Atherosclerotic heart disease of native coronary artery without angina pectoris: Secondary | ICD-10-CM

## 2012-12-09 DIAGNOSIS — Z09 Encounter for follow-up examination after completed treatment for conditions other than malignant neoplasm: Secondary | ICD-10-CM

## 2012-12-09 DIAGNOSIS — Z951 Presence of aortocoronary bypass graft: Secondary | ICD-10-CM

## 2012-12-09 DIAGNOSIS — I312 Hemopericardium, not elsewhere classified: Secondary | ICD-10-CM

## 2012-12-09 DIAGNOSIS — Z9889 Other specified postprocedural states: Secondary | ICD-10-CM

## 2012-12-09 NOTE — Progress Notes (Signed)
[address]            [place] [address]          [phone]     HPI: Patient returns for routine postoperative follow-up having undergone redo sternotomy, CABG x 2, and redo replacement of the ascending aorta on 11/05/2012 by Dr. Donata Clay.  The patient's postoperative course was complicated by atrial fibrillation, which was treated with Amiodarone and Lopressor. She was treated for a Klebsiella urinary tract infection.  She also had an enlarging right effusion on chest x-ray and underwent ultrasound guided thoracentesis, yielding 250 ml bloody fluid.  A follow up CT scan showed only small pleural effusions, but a new pericardial effusion.  A 2D echo and CT angio were performed which confirmed a hematoma pressing on the right atrium.  She was returned to the operating room for a subxiphoid pericardial window.  She made slow but steady progress and ultimately was discharged on 11/26/2012.  Since hospital discharge, the patient has felt weak and poorly.  She has been working with physical therapy and is walking without difficulty.  Her main issues have been anorexia and constipation.  She really isn't eating much at all.  She has not taken any laxatives, but has been eating prunes without improvement.  She is still quite sore in her chest requiring narcotic pain meds.  She denies shortness of breath.  She saw Dr. Blanchie Dessert PA last week.  Her most recent INR was 1.3, and her Coumadin dose was adjust to 5 mg Tuesday/Thursday/Saturday/Sunday, and 2.5 mg Monday/Wednesday/Friday.        Current Outpatient Prescriptions  Medication Sig Dispense Refill  . amiodarone (PACERONE) 200 MG tablet Take 1 tablet (200 mg total) by mouth 2 (two) times daily.  60 tablet  1  . Ascorbic Acid (VITAMIN C) 1000 MG tablet Take 1,000 mg by mouth daily.      Marland Kitchen aspirin 81 MG chewable tablet Chew 81 mg by mouth daily.      . cholecalciferol (VITAMIN D) 1000 UNITS tablet Take 1,000 Units  by mouth daily.      . ferrous fumarate-b12-vitamic C-folic acid (TRINSICON / FOLTRIN) capsule Take 1 capsule by mouth 3 (three) times daily after meals.  90 capsule  1  . metoprolol tartrate (LOPRESSOR) 12.5 mg TABS Take 0.5 tablets (12.5 mg total) by mouth 2 (two) times daily.  60 each  1  . naphazoline-pheniramine (NAPHCON-A) 0.025-0.3 % ophthalmic solution Place 1 drop into both eyes 2 (two) times daily as needed. For redness/dry eyes      . Omega-3 Fatty Acids (FISH OIL) 1200 MG CAPS Take 1,200 mg by mouth daily.      Marland Kitchen omeprazole (PRILOSEC) 20 MG capsule Take 20 mg by mouth daily.      Marland Kitchen oxyCODONE (OXY IR/ROXICODONE) 5 MG immediate release tablet Take 1-2 tablets (5-10 mg total) by mouth every 3 (three) hours as needed.  50 tablet  0  . pravastatin (PRAVACHOL) 80 MG tablet Take 80 mg by mouth daily.      Marland Kitchen warfarin (COUMADIN) 2 MG tablet Take 1 tablet (2 mg total) by mouth daily.  30 tablet  1  . furosemide (LASIX) 40 MG tablet Take 1 tablet (40 mg total) by mouth daily.  14 tablet  0  . potassium chloride SA (K-DUR,KLOR-CON) 20 MEQ tablet Take 2 tablets (40 mEq total) by  mouth daily.  14 tablet  0   No current facility-administered medications for this visit.     Physical Exam: BP 135/72 HR 86 Resp 20 Wounds: Healing well.  Chest tube sutures are removed today without difficulty. Heart: regular rate and rhythm Lungs: Clear on left, slightly decreased breath sounds on the right Extremities: No lower extremity edema   Diagnostic Tests: Chest xray: Dg Chest 2 View  12/09/2012  *RADIOLOGY REPORT*  Clinical Data: Chest pain after CABG.  CHEST - 2 VIEW  Comparison: 11/25/2012.  Findings: Trachea is midline.  Heart size is grossly stable. Sternotomy wires are unchanged in position.  Moderate right pleural effusion is unchanged, with adjacent right basilar volume loss. Left lung is low in volume with probable scarring in the left lower lobe.  IMPRESSION: Stable moderate right pleural  effusion and right basilar volume loss.   Original Report Authenticated By: Leanna Battles, M.D.        Assessment/Plan: The patient is making slow progress.  She remains weak.  She is maintaining sinus rhythm, and has a stable loculated right effusion on chest x-ray.  She does not appear volume overloaded, and has finished her Lasix course.  I have recommended OTC laxatives/stool softeners for her constipation, and asked her to start Ensure supplements for nutritional support.  She will follow up in 2 weeks with Dr. Donata Clay with a repeat chest x-ray to re-evaluate her effusion.  She will follow up with cardiology as directed.

## 2012-12-11 DIAGNOSIS — Z48812 Encounter for surgical aftercare following surgery on the circulatory system: Secondary | ICD-10-CM

## 2012-12-16 ENCOUNTER — Ambulatory Visit: Payer: Self-pay | Admitting: Cardiology

## 2012-12-16 DIAGNOSIS — Z7901 Long term (current) use of anticoagulants: Secondary | ICD-10-CM

## 2012-12-16 DIAGNOSIS — Z952 Presence of prosthetic heart valve: Secondary | ICD-10-CM

## 2012-12-18 ENCOUNTER — Other Ambulatory Visit: Payer: Self-pay | Admitting: *Deleted

## 2012-12-18 DIAGNOSIS — I2510 Atherosclerotic heart disease of native coronary artery without angina pectoris: Secondary | ICD-10-CM

## 2012-12-20 ENCOUNTER — Encounter: Payer: Self-pay | Admitting: Cardiothoracic Surgery

## 2012-12-20 ENCOUNTER — Ambulatory Visit
Admission: RE | Admit: 2012-12-20 | Discharge: 2012-12-20 | Disposition: A | Discharge status: Home / Self Care | Payer: PRIVATE HEALTH INSURANCE | Source: Ambulatory Visit | Attending: Cardiothoracic Surgery | Admitting: Cardiothoracic Surgery

## 2012-12-20 ENCOUNTER — Ambulatory Visit (INDEPENDENT_AMBULATORY_CARE_PROVIDER_SITE_OTHER): Payer: Self-pay | Admitting: Cardiothoracic Surgery

## 2012-12-20 VITALS — BP 147/80 | HR 82 | Resp 16 | Ht 66.0 in | Wt 154.0 lb

## 2012-12-20 DIAGNOSIS — I313 Pericardial effusion (noninflammatory): Secondary | ICD-10-CM

## 2012-12-20 DIAGNOSIS — Z09 Encounter for follow-up examination after completed treatment for conditions other than malignant neoplasm: Secondary | ICD-10-CM

## 2012-12-20 DIAGNOSIS — I319 Disease of pericardium, unspecified: Secondary | ICD-10-CM

## 2012-12-20 DIAGNOSIS — Z951 Presence of aortocoronary bypass graft: Secondary | ICD-10-CM

## 2012-12-20 DIAGNOSIS — I2510 Atherosclerotic heart disease of native coronary artery without angina pectoris: Secondary | ICD-10-CM

## 2012-12-20 DIAGNOSIS — I712 Thoracic aortic aneurysm, without rupture: Secondary | ICD-10-CM

## 2012-12-20 NOTE — Progress Notes (Signed)
PCP is No primary provider on file. Referring Provider is Rennis Golden Lisette Abu., MD  Chief Complaint  Patient presents with  . Routine Post Op    CABG/PERICARDIAL WINDOW ...11/05/12.Marland Kitchen..11/05/12 with cxr    HPI: 6 weeks after redo sternotomy for replacement of ascending aneurysm using circulatory arrest and right axillary artery cannulation  Postoperative pericardial hematoma required subxiphoid evacuation  Postoperative atrial fibrillation now sinus on amiodarone  Postoperative elevation right hemidiaphragm  Routine office visit. Patient feeling better. She is ready to start phase II cardiac rehabilitation. We'll refer her to outpatient physical therapy for right-hand weakness probably from exposure right axillary artery and brachial plexus stretch   Past Medical History  Diagnosis Date  . Coronary artery disease 2000    aortic mechanical valve replacement  . Anginal pain   . Shortness of breath     Past Surgical History  Procedure Laterality Date  . Vascular surgery    . Coronary artery bypass graft  11/05/2012    Procedure: CORONARY ARTERY BYPASS GRAFTING (CABG);  Surgeon: Kerin Perna, MD;  Location: [facility] OR;  Service: Open Heart Surgery;  Laterality: N/A;  cannulate right subclavian  . Replacement ascending aorta  11/05/2012    Procedure: REPLACEMENT ASCENDING AORTA;  Surgeon: Kerin Perna, MD;  Location: [facility] OR;  Service: Open Heart Surgery;  Laterality: N/A;  . Intraoperative transesophageal echocardiogram  11/05/2012    Procedure: INTRAOPERATIVE TRANSESOPHAGEAL ECHOCARDIOGRAM;  Surgeon: Kerin Perna, MD;  Location: [facility] OR;  Service: Open Heart Surgery;  Laterality: N/A;  . Endovein harvest of greater saphenous vein  11/05/2012    Procedure: ENDOVEIN HARVEST OF GREATER SAPHENOUS VEIN;  Surgeon: Kerin Perna, MD;  Location: [facility] OR;  Service: Open Heart Surgery;  Laterality: Right;  . Pericardial window N/A 11/21/2012    Procedure: PERICARDIAL WINDOW;  Surgeon: Kerin Perna, MD;   Location: [facility] OR;  Service: Thoracic;  Laterality: N/A;  . Intraoperative transesophageal echocardiogram N/A 11/21/2012    Procedure: INTRAOPERATIVE TRANSESOPHAGEAL ECHOCARDIOGRAM;  Surgeon: Kerin Perna, MD;  Location: [facility] OR;  Service: Open Heart Surgery;  Laterality: N/A;    No family history on file.  Social History History  Substance Use Topics  . Smoking status: Never Smoker   . Smokeless tobacco: Not on file  . Alcohol Use: No    Current Outpatient Prescriptions  Medication Sig Dispense Refill  . acetaminophen (TYLENOL) 650 MG CR tablet Take 650 mg by mouth every 8 (eight) hours as needed for pain.      Marland Kitchen amiodarone (PACERONE) 200 MG tablet Take 1 tablet (200 mg total) by mouth 2 (two) times daily.  60 tablet  1  . Ascorbic Acid (VITAMIN C) 1000 MG tablet Take 1,000 mg by mouth daily.      Marland Kitchen aspirin 81 MG chewable tablet Chew 81 mg by mouth daily.      . cholecalciferol (VITAMIN D) 1000 UNITS tablet Take 1,000 Units by mouth daily.      . ferrous fumarate-b12-vitamic C-folic acid (TRINSICON / FOLTRIN) capsule Take 1 capsule by mouth 3 (three) times daily after meals.  90 capsule  1  . furosemide (LASIX) 40 MG tablet Take 1 tablet (40 mg total) by mouth daily.  14 tablet  0  . metoprolol tartrate (LOPRESSOR) 12.5 mg TABS Take 0.5 tablets (12.5 mg total) by mouth 2 (two) times daily.  60 each  1  . naphazoline-pheniramine (NAPHCON-A) 0.025-0.3 % ophthalmic solution Place 1 drop into both eyes 2 (two) times  daily as needed. For redness/dry eyes      . Omega-3 Fatty Acids (FISH OIL) 1200 MG CAPS Take 1,200 mg by mouth daily.      Marland Kitchen omeprazole (PRILOSEC) 20 MG capsule Take 20 mg by mouth daily.      . potassium chloride SA (K-DUR,KLOR-CON) 20 MEQ tablet Take 2 tablets (40 mEq total) by mouth daily.  14 tablet  0  . pravastatin (PRAVACHOL) 80 MG tablet Take 80 mg by mouth daily.      Marland Kitchen warfarin (COUMADIN) 2 MG tablet Take 1 tablet (2 mg total) by mouth daily.  30 tablet  1  .  oxyCODONE (OXY IR/ROXICODONE) 5 MG immediate release tablet Take 1-2 tablets (5-10 mg total) by mouth every 3 (three) hours as needed.  50 tablet  0   No current facility-administered medications for this visit.    Allergies  Allergen Reactions  . Contrast Media (Iodinated Diagnostic Agents) Hives, Itching and Swelling    Review of Systems no fever incisions healing well No symptoms of orthopnea peripheral edema or signs of recurrent pericardial effusion  BP 147/80  Pulse 82  Resp 16  Ht 5\' 6"  (1.676 m)  Wt 154 lb (69.854 kg)  BMI 24.87 kg/m2  SpO2 98% Physical Exam Alert and comfortable Sharp closure sound of aortic valve Regular heart rhythm Slightly diminished breath sounds right base No pedal edema Surgical incisions are healing well  Diagnostic Tests chest x-ray shows postop elevation right hemidiaphragm, mild with some atelectasis as well   Impression and plan-patient needs to start cardiac rehabilitation and will also start outpatient physical therapy for weakness and right hand. She is ready to start driving And doing light housework but is not ready to return to work. We'll see her back one month with chest x-ray. Do not think she needs more Lasix.  Her amiodarone will be weaned off 1 tablet a day until her prescription runs out that we will stop the amiodarone and she is maintaining sinus rhythm and the amiodarone is causing her INR to be difficult to manage

## 2012-12-31 ENCOUNTER — Telehealth: Payer: Self-pay | Admitting: *Deleted

## 2012-12-31 NOTE — Telephone Encounter (Signed)
[name] from cardiac rehab called to notify us that [name] is unable to start cardiac rehab right now d/t car issues.  Also she has not heard from outpatient rehab regarding her treatment for her right hand weakness. I called [facility] to be sure they had received my referral, which they had.  But they have some before her.  I called her to give her an update on this. She understands.

## 2013-01-07 ENCOUNTER — Ambulatory Visit: Payer: PRIVATE HEALTH INSURANCE | Attending: Cardiothoracic Surgery | Admitting: Occupational Therapy

## 2013-01-07 DIAGNOSIS — M6281 Muscle weakness (generalized): Secondary | ICD-10-CM | POA: Insufficient documentation

## 2013-01-07 DIAGNOSIS — R279 Unspecified lack of coordination: Secondary | ICD-10-CM | POA: Insufficient documentation

## 2013-01-07 DIAGNOSIS — IMO0001 Reserved for inherently not codable concepts without codable children: Secondary | ICD-10-CM | POA: Insufficient documentation

## 2013-01-10 ENCOUNTER — Ambulatory Visit: Payer: PRIVATE HEALTH INSURANCE | Admitting: Occupational Therapy

## 2013-01-13 ENCOUNTER — Other Ambulatory Visit: Payer: Self-pay | Admitting: *Deleted

## 2013-01-13 DIAGNOSIS — I2510 Atherosclerotic heart disease of native coronary artery without angina pectoris: Secondary | ICD-10-CM

## 2013-01-15 ENCOUNTER — Encounter: Payer: Self-pay | Admitting: Cardiothoracic Surgery

## 2013-01-15 ENCOUNTER — Other Ambulatory Visit: Payer: Self-pay | Admitting: *Deleted

## 2013-01-15 ENCOUNTER — Ambulatory Visit
Admission: RE | Admit: 2013-01-15 | Discharge: 2013-01-15 | Disposition: A | Discharge status: Home / Self Care | Payer: PRIVATE HEALTH INSURANCE | Source: Ambulatory Visit | Attending: Cardiothoracic Surgery | Admitting: Cardiothoracic Surgery

## 2013-01-15 ENCOUNTER — Ambulatory Visit (INDEPENDENT_AMBULATORY_CARE_PROVIDER_SITE_OTHER): Payer: Self-pay | Admitting: Cardiothoracic Surgery

## 2013-01-15 VITALS — BP 133/76 | HR 79 | Resp 20 | Ht 66.0 in | Wt 154.0 lb

## 2013-01-15 DIAGNOSIS — I319 Disease of pericardium, unspecified: Secondary | ICD-10-CM

## 2013-01-15 DIAGNOSIS — I313 Pericardial effusion (noninflammatory): Secondary | ICD-10-CM

## 2013-01-15 DIAGNOSIS — Z951 Presence of aortocoronary bypass graft: Secondary | ICD-10-CM

## 2013-01-15 DIAGNOSIS — I3139 Other pericardial effusion (noninflammatory): Secondary | ICD-10-CM

## 2013-01-15 DIAGNOSIS — Z9889 Other specified postprocedural states: Secondary | ICD-10-CM

## 2013-01-15 DIAGNOSIS — I2510 Atherosclerotic heart disease of native coronary artery without angina pectoris: Secondary | ICD-10-CM

## 2013-01-15 DIAGNOSIS — Z8679 Personal history of other diseases of the circulatory system: Secondary | ICD-10-CM

## 2013-01-15 NOTE — Progress Notes (Signed)
PCP is No primary provider on file. Referring Provider is Theressa Stamps, MD  Chief Complaint  Patient presents with  . Routine Post Op    1 month f/u,     HPI: Patient returns almost 3 months after review Bentall with combined CABG. She has mechanical St. Jude aVR. She had postop hemopericardium which required a subxiphoid window. She is now recovering well, improved strength, improved pacing appetite and started to gain weight. She denies symptoms of CHF or orthopnea. 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 She's had no problems with her Coumadin or bleeding. She is maintained sinus rhythm. Incision is well-healed. She's had persistent right hand weakness and numbness in the ulnar distribution related to right axillary artery cannulation for redo aortic surgery with circulatory arrest. She is receiving physical therapy now at the rehabilitation center.  Past Medical History  Diagnosis Date  . Coronary artery disease 2000    aortic mechanical valve replacement  . Anginal pain   . Shortness of breath     Past Surgical History  Procedure Laterality Date  . Vascular surgery    . Coronary artery bypass graft  11/05/2012    Procedure: CORONARY ARTERY BYPASS GRAFTING (CABG);  Surgeon: Kerin Perna, MD;  Location: [facility] OR;  Service: Open Heart Surgery;  Laterality: N/A;  cannulate right subclavian  . Replacement ascending aorta  11/05/2012    Procedure: REPLACEMENT ASCENDING AORTA;  Surgeon: Kerin Perna, MD;  Location: [facility] OR;  Service: Open Heart Surgery;  Laterality: N/A;  . Intraoperative transesophageal echocardiogram  11/05/2012    Procedure: INTRAOPERATIVE TRANSESOPHAGEAL ECHOCARDIOGRAM;  Surgeon: Kerin Perna, MD;  Location: [facility] OR;  Service: Open Heart Surgery;  Laterality: N/A;  . Endovein harvest of greater saphenous vein  11/05/2012    Procedure: ENDOVEIN HARVEST OF GREATER SAPHENOUS VEIN;  Surgeon: Kerin Perna, MD;  Location: [facility] OR;  Service: Open  Heart Surgery;  Laterality: Right;  . Pericardial window N/A 11/21/2012    Procedure: PERICARDIAL WINDOW;  Surgeon: Kerin Perna, MD;  Location: [facility] OR;  Service: Thoracic;  Laterality: N/A;  . Intraoperative transesophageal echocardiogram N/A 11/21/2012    Procedure: INTRAOPERATIVE TRANSESOPHAGEAL ECHOCARDIOGRAM;  Surgeon: Kerin Perna, MD;  Location: [facility] OR;  Service: Open Heart Surgery;  Laterality: N/A;    No family history on file.  Social History History  Substance Use Topics  . Smoking status: Never Smoker   . Smokeless tobacco: Not on file  . Alcohol Use: No    Current Outpatient Prescriptions  Medication Sig Dispense Refill  . metoprolol tartrate (LOPRESSOR) 12.5 mg TABS Take 0.5 tablets (12.5 mg total) by mouth 2 (two) times daily.  60 each  1  . omeprazole (PRILOSEC) 20 MG capsule Take 20 mg by mouth daily.      . pravastatin (PRAVACHOL) 80 MG tablet Take 80 mg by mouth daily.      Marland Kitchen PREMPRO 0.3-1.5 MG per tablet Take 1 tablet by mouth daily.       . traMADol (ULTRAM) 50 MG tablet Take 50 mg by mouth every 8 (eight) hours as needed.       . warfarin (COUMADIN) 2 MG tablet Take 1 tablet (2 mg total) by mouth daily.  30 tablet  1   No current facility-administered medications for this visit.    Allergies  Allergen Reactions  . Contrast Media (Iodinated Diagnostic Agents) Hives, Itching and Swelling    Review of Systems right hand improving  appetite improving overall getting better  BP 133/76  Pulse 79  Resp 20  Ht 5\' 6"  (1.676 m)  Wt 154 lb (69.854 kg)  BMI 24.87 kg/m2  SpO2 98% Physical Exam Alert and comfortable Breath sounds clear Aortic valve sharp closure sound no AI Sternal and axillary artery incision is well-healed No edema Neuro intact  Diagnostic Tests: Chest x-ray with slight improvement of right basilar atelectasis with mild elevation right hemidiaphragm  Impression: Good recovering almost 3 months postop. Ready to return to work. Return  to work form signed for patient  Plan: Return with CTA of the thoracic aorta one year after surgery.

## 2013-01-16 ENCOUNTER — Ambulatory Visit: Payer: PRIVATE HEALTH INSURANCE | Admitting: Occupational Therapy

## 2013-01-20 ENCOUNTER — Encounter: Payer: Self-pay | Admitting: *Deleted

## 2013-01-21 ENCOUNTER — Ambulatory Visit: Payer: PRIVATE HEALTH INSURANCE | Admitting: Occupational Therapy

## 2013-01-23 ENCOUNTER — Ambulatory Visit: Payer: PRIVATE HEALTH INSURANCE | Admitting: Occupational Therapy

## 2013-01-28 ENCOUNTER — Ambulatory Visit: Payer: PRIVATE HEALTH INSURANCE | Admitting: Occupational Therapy

## 2013-01-29 ENCOUNTER — Other Ambulatory Visit: Payer: Self-pay | Admitting: Surgical

## 2013-01-29 ENCOUNTER — Ambulatory Visit: Payer: PRIVATE HEALTH INSURANCE | Admitting: Occupational Therapy

## 2013-01-29 DIAGNOSIS — I712 Thoracic aortic aneurysm, without rupture: Secondary | ICD-10-CM

## 2013-01-30 ENCOUNTER — Encounter: Payer: PRIVATE HEALTH INSURANCE | Admitting: Occupational Therapy

## 2013-02-06 ENCOUNTER — Encounter (HOSPITAL_COMMUNITY)
Admission: RE | Admit: 2013-02-06 | Discharge: 2013-02-06 | Disposition: A | Discharge status: Home / Self Care | Payer: PRIVATE HEALTH INSURANCE | Source: Ambulatory Visit | Attending: Internal Medicine | Admitting: Internal Medicine

## 2013-02-06 DIAGNOSIS — I200 Unstable angina: Secondary | ICD-10-CM | POA: Insufficient documentation

## 2013-02-06 DIAGNOSIS — Z5189 Encounter for other specified aftercare: Secondary | ICD-10-CM | POA: Insufficient documentation

## 2013-02-06 DIAGNOSIS — I2510 Atherosclerotic heart disease of native coronary artery without angina pectoris: Secondary | ICD-10-CM | POA: Insufficient documentation

## 2013-02-06 NOTE — Progress Notes (Signed)
Cardiac Rehab Medication Review by a Pharmacist  Does the patient  feel that his/her medications are working for him/her?  yes  Has the patient been experiencing any side effects to the medications prescribed?  no  Does the patient measure his/her own blood pressure or blood glucose at home?  no   Does the patient have any problems obtaining medications due to transportation or finances?   no  Understanding of regimen: good Understanding of indications: good Potential of compliance: excellent    Pharmacist comments: Patient has good understanding of her medications and excellent compliance. All indications and mechanism of actions for medications were reviewed with patient. Patient expressed that she does not think she needs her Prilosec and will discuss with physician    Dub Mikes 02/06/2013 8:14 AM

## 2013-02-10 ENCOUNTER — Encounter (HOSPITAL_COMMUNITY)
Admission: RE | Admit: 2013-02-10 | Discharge: 2013-02-10 | Disposition: A | Discharge status: Home / Self Care | Payer: PRIVATE HEALTH INSURANCE | Source: Ambulatory Visit | Attending: Internal Medicine | Admitting: Internal Medicine

## 2013-02-10 NOTE — Progress Notes (Signed)
Pt started cardiac rehab today.  Pt tolerated light exercise without difficulty.  VSS, telemetry-sinus rhythm.  Pt oriented to exercise equipment and routine.  Understanding verbalized.

## 2013-02-12 ENCOUNTER — Encounter (HOSPITAL_COMMUNITY)
Admission: RE | Admit: 2013-02-12 | Discharge: 2013-02-12 | Disposition: A | Discharge status: Home / Self Care | Payer: PRIVATE HEALTH INSURANCE | Source: Ambulatory Visit | Attending: Internal Medicine | Admitting: Internal Medicine

## 2013-02-12 ENCOUNTER — Encounter (HOSPITAL_COMMUNITY): Payer: Self-pay

## 2013-02-14 ENCOUNTER — Encounter (HOSPITAL_COMMUNITY)
Admission: RE | Admit: 2013-02-14 | Discharge: 2013-02-14 | Disposition: A | Discharge status: Home / Self Care | Payer: PRIVATE HEALTH INSURANCE | Source: Ambulatory Visit | Attending: Internal Medicine | Admitting: Internal Medicine

## 2013-02-17 ENCOUNTER — Encounter (HOSPITAL_COMMUNITY)
Admission: RE | Admit: 2013-02-17 | Discharge: 2013-02-17 | Disposition: A | Discharge status: Home / Self Care | Payer: PRIVATE HEALTH INSURANCE | Source: Ambulatory Visit | Attending: Internal Medicine | Admitting: Internal Medicine

## 2013-02-19 ENCOUNTER — Encounter (HOSPITAL_COMMUNITY)
Admission: RE | Admit: 2013-02-19 | Discharge: 2013-02-19 | Disposition: A | Discharge status: Home / Self Care | Payer: PRIVATE HEALTH INSURANCE | Source: Ambulatory Visit | Attending: Internal Medicine | Admitting: Internal Medicine

## 2013-02-21 ENCOUNTER — Encounter (HOSPITAL_COMMUNITY)
Admission: RE | Admit: 2013-02-21 | Discharge: 2013-02-21 | Disposition: A | Discharge status: Home / Self Care | Payer: PRIVATE HEALTH INSURANCE | Source: Ambulatory Visit | Attending: Internal Medicine | Admitting: Internal Medicine

## 2013-02-24 ENCOUNTER — Encounter (HOSPITAL_COMMUNITY): Payer: PRIVATE HEALTH INSURANCE

## 2013-02-26 ENCOUNTER — Telehealth: Payer: Self-pay | Admitting: Internal Medicine

## 2013-02-26 ENCOUNTER — Encounter (HOSPITAL_COMMUNITY)
Admission: RE | Admit: 2013-02-26 | Discharge: 2013-02-26 | Disposition: A | Discharge status: Home / Self Care | Payer: PRIVATE HEALTH INSURANCE | Source: Ambulatory Visit | Attending: Internal Medicine | Admitting: Internal Medicine

## 2013-02-28 ENCOUNTER — Encounter (HOSPITAL_COMMUNITY)
Admission: RE | Admit: 2013-02-28 | Discharge: 2013-02-28 | Disposition: A | Discharge status: Home / Self Care | Payer: PRIVATE HEALTH INSURANCE | Source: Ambulatory Visit | Attending: Internal Medicine | Admitting: Internal Medicine

## 2013-02-28 ENCOUNTER — Encounter (HOSPITAL_COMMUNITY): Payer: Self-pay

## 2013-02-28 ENCOUNTER — Ambulatory Visit (INDEPENDENT_AMBULATORY_CARE_PROVIDER_SITE_OTHER): Payer: PRIVATE HEALTH INSURANCE | Admitting: Pharmacist Clinician (PhC)/ Clinical Pharmacy Specialist

## 2013-02-28 VITALS — BP 156/80 | HR 68

## 2013-02-28 DIAGNOSIS — Z7901 Long term (current) use of anticoagulants: Secondary | ICD-10-CM

## 2013-02-28 DIAGNOSIS — Z954 Presence of other heart-valve replacement: Secondary | ICD-10-CM

## 2013-02-28 DIAGNOSIS — Z952 Presence of prosthetic heart valve: Secondary | ICD-10-CM

## 2013-02-28 NOTE — Progress Notes (Signed)
Reviewed home exercise guidelines with patient including endpoints, temperature precautions, target heart rate and rate of perceived exertions. Pt is walking as her mode of home exercise. Pt voices understanding of instructions given.  Cristy Hilts, MS, ACSM CES

## 2013-02-28 NOTE — Progress Notes (Signed)
Pt graduated from cardiac rehab program today.  Medication list reconciled.  PHQ9 score- zero .  Pt has made significant lifestyle changes and should be commended for her success. Pt plans to continue exercise by walking at [facility] .  Pt is excited about going back to work however admits that it is emotionally stressful. Pt encouraged to use stress management techniques learned. Pt verbalized understanding and states she feels confident to be able to control stressful situations.

## 2013-03-05 NOTE — Telephone Encounter (Signed)
Tiffany from [facility] called-She have an order for Dr Rennis Golden to sign-Faxed it over on 5-28,still have not received it back-Please call and let her know today!

## 2013-03-05 NOTE — Telephone Encounter (Signed)
Returned call and staff member unable to find who called.  No extension number was left or last name for Tiffany.  Will await return call.

## 2013-03-06 ENCOUNTER — Telehealth: Payer: Self-pay | Admitting: *Deleted

## 2013-03-06 NOTE — Telephone Encounter (Signed)
Faxed Orders back to [facility]

## 2013-03-21 ENCOUNTER — Ambulatory Visit (INDEPENDENT_AMBULATORY_CARE_PROVIDER_SITE_OTHER): Payer: PRIVATE HEALTH INSURANCE | Admitting: Pharmacist Clinician (PhC)/ Clinical Pharmacy Specialist

## 2013-03-21 VITALS — BP 108/56 | HR 81

## 2013-03-21 DIAGNOSIS — Z7901 Long term (current) use of anticoagulants: Secondary | ICD-10-CM

## 2013-03-21 DIAGNOSIS — Z952 Presence of prosthetic heart valve: Secondary | ICD-10-CM

## 2013-03-21 DIAGNOSIS — Z954 Presence of other heart-valve replacement: Secondary | ICD-10-CM

## 2013-03-21 NOTE — Progress Notes (Signed)
Can't close due to no return date for visit

## 2013-04-15 ENCOUNTER — Other Ambulatory Visit: Payer: Self-pay | Admitting: *Deleted

## 2013-04-15 MED ORDER — OMEPRAZOLE 20 MG PO CPDR: 20.0000 mg | DELAYED_RELEASE_CAPSULE | Freq: Every day | ORAL | Status: DC

## 2013-04-18 ENCOUNTER — Ambulatory Visit (INDEPENDENT_AMBULATORY_CARE_PROVIDER_SITE_OTHER): Payer: PRIVATE HEALTH INSURANCE | Admitting: Pharmacist Clinician (PhC)/ Clinical Pharmacy Specialist

## 2013-04-18 VITALS — BP 106/54 | HR 76

## 2013-04-18 DIAGNOSIS — Z954 Presence of other heart-valve replacement: Secondary | ICD-10-CM

## 2013-04-18 DIAGNOSIS — Z7901 Long term (current) use of anticoagulants: Secondary | ICD-10-CM

## 2013-04-18 DIAGNOSIS — Z952 Presence of prosthetic heart valve: Secondary | ICD-10-CM

## 2013-04-18 LAB — POCT INR: INR: 2.5

## 2013-05-16 ENCOUNTER — Ambulatory Visit (INDEPENDENT_AMBULATORY_CARE_PROVIDER_SITE_OTHER): Payer: PRIVATE HEALTH INSURANCE | Admitting: Pharmacist Clinician (PhC)/ Clinical Pharmacy Specialist

## 2013-05-16 VITALS — BP 140/64 | HR 76

## 2013-05-16 DIAGNOSIS — Z952 Presence of prosthetic heart valve: Secondary | ICD-10-CM

## 2013-05-16 DIAGNOSIS — Z954 Presence of other heart-valve replacement: Secondary | ICD-10-CM

## 2013-05-16 DIAGNOSIS — Z7901 Long term (current) use of anticoagulants: Secondary | ICD-10-CM

## 2013-05-16 LAB — POCT INR: INR: 1.7

## 2013-06-06 ENCOUNTER — Ambulatory Visit: Payer: PRIVATE HEALTH INSURANCE | Admitting: Pharmacist Clinician (PhC)/ Clinical Pharmacy Specialist

## 2013-06-18 ENCOUNTER — Other Ambulatory Visit: Payer: Self-pay | Admitting: Internal Medicine

## 2013-06-19 ENCOUNTER — Other Ambulatory Visit: Payer: Self-pay | Admitting: Internal Medicine

## 2013-07-04 ENCOUNTER — Ambulatory Visit (INDEPENDENT_AMBULATORY_CARE_PROVIDER_SITE_OTHER): Payer: PRIVATE HEALTH INSURANCE | Admitting: Pharmacist Clinician (PhC)/ Clinical Pharmacy Specialist

## 2013-07-04 VITALS — BP 152/72 | HR 72

## 2013-07-04 DIAGNOSIS — Z952 Presence of prosthetic heart valve: Secondary | ICD-10-CM

## 2013-07-04 DIAGNOSIS — Z954 Presence of other heart-valve replacement: Secondary | ICD-10-CM

## 2013-07-04 DIAGNOSIS — Z7901 Long term (current) use of anticoagulants: Secondary | ICD-10-CM

## 2013-07-04 LAB — POCT INR: INR: 2

## 2013-08-15 ENCOUNTER — Ambulatory Visit (INDEPENDENT_AMBULATORY_CARE_PROVIDER_SITE_OTHER): Payer: PRIVATE HEALTH INSURANCE | Admitting: Pharmacist Clinician (PhC)/ Clinical Pharmacy Specialist

## 2013-08-15 VITALS — BP 160/68 | HR 72

## 2013-08-15 DIAGNOSIS — Z954 Presence of other heart-valve replacement: Secondary | ICD-10-CM

## 2013-08-15 DIAGNOSIS — Z7901 Long term (current) use of anticoagulants: Secondary | ICD-10-CM

## 2013-08-15 DIAGNOSIS — I2510 Atherosclerotic heart disease of native coronary artery without angina pectoris: Secondary | ICD-10-CM

## 2013-08-15 DIAGNOSIS — Z952 Presence of prosthetic heart valve: Secondary | ICD-10-CM

## 2013-08-15 LAB — POCT INR: INR: 1.7

## 2013-09-05 ENCOUNTER — Ambulatory Visit (INDEPENDENT_AMBULATORY_CARE_PROVIDER_SITE_OTHER): Payer: PRIVATE HEALTH INSURANCE | Admitting: Pharmacist Clinician (PhC)/ Clinical Pharmacy Specialist

## 2013-09-05 VITALS — BP 130/64 | HR 72

## 2013-09-05 DIAGNOSIS — Z7901 Long term (current) use of anticoagulants: Secondary | ICD-10-CM

## 2013-09-05 DIAGNOSIS — Z954 Presence of other heart-valve replacement: Secondary | ICD-10-CM

## 2013-09-05 DIAGNOSIS — Z952 Presence of prosthetic heart valve: Secondary | ICD-10-CM

## 2013-09-05 LAB — POCT INR: INR: 3.3

## 2013-10-13 ENCOUNTER — Encounter: Payer: Self-pay | Admitting: Internal Medicine

## 2013-10-13 ENCOUNTER — Encounter: Payer: Self-pay | Admitting: *Deleted

## 2013-10-14 ENCOUNTER — Ambulatory Visit (INDEPENDENT_AMBULATORY_CARE_PROVIDER_SITE_OTHER): Payer: PRIVATE HEALTH INSURANCE | Admitting: Internal Medicine

## 2013-10-14 ENCOUNTER — Ambulatory Visit (INDEPENDENT_AMBULATORY_CARE_PROVIDER_SITE_OTHER): Payer: PRIVATE HEALTH INSURANCE | Admitting: Pharmacist Clinician (PhC)/ Clinical Pharmacy Specialist

## 2013-10-14 ENCOUNTER — Encounter: Payer: Self-pay | Admitting: Internal Medicine

## 2013-10-14 ENCOUNTER — Ambulatory Visit: Payer: PRIVATE HEALTH INSURANCE | Admitting: Pharmacist Clinician (PhC)/ Clinical Pharmacy Specialist

## 2013-10-14 VITALS — BP 140/80 | HR 62 | Ht 66.5 in | Wt 156.2 lb

## 2013-10-14 DIAGNOSIS — Z7901 Long term (current) use of anticoagulants: Secondary | ICD-10-CM

## 2013-10-14 DIAGNOSIS — I2510 Atherosclerotic heart disease of native coronary artery without angina pectoris: Secondary | ICD-10-CM

## 2013-10-14 DIAGNOSIS — I77810 Thoracic aortic ectasia: Secondary | ICD-10-CM

## 2013-10-14 DIAGNOSIS — Z954 Presence of other heart-valve replacement: Secondary | ICD-10-CM

## 2013-10-14 DIAGNOSIS — Z9889 Other specified postprocedural states: Secondary | ICD-10-CM

## 2013-10-14 DIAGNOSIS — I10 Essential (primary) hypertension: Secondary | ICD-10-CM

## 2013-10-14 DIAGNOSIS — I309 Acute pericarditis, unspecified: Secondary | ICD-10-CM

## 2013-10-14 DIAGNOSIS — Z8679 Personal history of other diseases of the circulatory system: Secondary | ICD-10-CM

## 2013-10-14 DIAGNOSIS — Z952 Presence of prosthetic heart valve: Secondary | ICD-10-CM

## 2013-10-14 DIAGNOSIS — E785 Hyperlipidemia, unspecified: Secondary | ICD-10-CM

## 2013-10-14 DIAGNOSIS — Z79899 Other long term (current) drug therapy: Secondary | ICD-10-CM

## 2013-10-14 LAB — CBC
HCT: 39.2 % (ref 36.0–46.0)
Hemoglobin: 12.9 g/dL (ref 12.0–15.0)
MCH: 24.5 pg — AB (ref 26.0–34.0)
MCHC: 32.9 g/dL (ref 30.0–36.0)
MCV: 74.4 fL — AB (ref 78.0–100.0)
PLATELETS: 255 10*3/uL (ref 150–400)
RBC: 5.27 MIL/uL — ABNORMAL HIGH (ref 3.87–5.11)
RDW: 17.8 % — AB (ref 11.5–15.5)
WBC: 4.4 10*3/uL (ref 4.0–10.5)

## 2013-10-14 LAB — COMPREHENSIVE METABOLIC PANEL
ALT: 29 U/L (ref 0–35)
AST: 25 U/L (ref 0–37)
Albumin: 4.1 g/dL (ref 3.5–5.2)
Alkaline Phosphatase: 123 U/L — ABNORMAL HIGH (ref 39–117)
BILIRUBIN TOTAL: 0.8 mg/dL (ref 0.3–1.2)
BUN: 14 mg/dL (ref 6–23)
CO2: 30 meq/L (ref 19–32)
Calcium: 9.9 mg/dL (ref 8.4–10.5)
Chloride: 106 mEq/L (ref 96–112)
Creat: 0.81 mg/dL (ref 0.50–1.10)
Glucose, Bld: 73 mg/dL (ref 70–99)
Potassium: 4.3 mEq/L (ref 3.5–5.3)
Sodium: 140 mEq/L (ref 135–145)
Total Protein: 6.9 g/dL (ref 6.0–8.3)

## 2013-10-14 LAB — POCT INR: INR: 4

## 2013-10-14 NOTE — Patient Instructions (Addendum)
Please have fasting blood work. We will call you with the results CMP NMR CBC  Your physician wants you to follow-up in: 6 months. You will receive a reminder letter in the mail two months in advance. If you don't receive a letter, please call our office to schedule the follow-up appointment.  Your physician has requested that you have an echocardiogram. Echocardiography is a painless test that uses sound waves to create images of your heart. It provides your doctor with information about the size and shape of your heart and how well your heart's chambers and valves are working. This procedure takes approximately one hour. There are no restrictions for this procedure. Please schedule this in a few weeks.   Dr. Rennis Golden has ordered a CT angiogram - please schedule this for about 1 month from now  Please schedule a visit with Dr. Donata Clay

## 2013-10-14 NOTE — Progress Notes (Signed)
OFFICE NOTE  Chief Complaint:  Follow-up  Primary Care Physician: Kari Baars, MD  HPI:  [name] is a very pleasant 63 year old African American female with a history of St. Jude AVR placed in 2000 for aortic insufficiency. 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 She also has a history of sarcoidosis, dyslipidemia and contrast allergy, which apparently gave her hives. In April 2013, she saw Dr. Clarene Duke with complaints of chest pain, for which he then did a Myoview on her, which was normal. She was last seen by Corine Shelter on October 21, 2012. At that time she was complaining of having chest pressure and shortness of breath. She was set up for a diagnostic heart catheterization. This was completed by Dr. Rennis Golden, which revealed severe ostial left main stenosis greater than 95%. There was also suggestion of possible ostial RCA disease. The ascending aorta was dilated up to 4.8 cm. At that time, Dr. Donata Clay was asked to consult. Patient was then taken for bypass surgery x2 with a saphenous vein graft to the left anterior descending coronary artery and a saphenous vein graft to the circumflex marginal. 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 Also during that hospitalization, she had development of a pericardial hematoma. Dr. Donata Clay then did a subxiphoid pericardial exposure and evacuation of the pericardial hematoma. She was seen in follow-up in March 2014 and was doing well.  She had lost about 17 lbs during her hospitalization.  She reports returning to work this past summer and is still able to do most activities. Her INR has been followed in our Coumadin clinic and is actually supratherapeutic today at 4. Her dose will be adjusted. She's not had any further laboratory work or followup since this past Spring.  She denies any chest pain with exertion, but does report some discomfort associated with her anterior  scars as well as her sternal wires. She does occasionally get fatigued with work but this improves with rest.  PMHx:  Past Medical History  Diagnosis Date  . Coronary artery disease     mech AVR, CABG  . Anginal pain   . Shortness of breath   . S/P AVR (aortic valve replacement) 08/1999    St. Jude AVR for aortic insuff  . Subclavian artery stenosis 06/2000    left subclavian bypass  . 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 Donata Clay) - complicate by pericardial hematoma --> subxiphoid pericardial exposure and evacuation  . Hypertension   . Hyperlipidemia   . RA (rheumatoid arthritis)   . Sarcoid     Past Surgical History  Procedure Laterality Date  . Coronary artery bypass graft  11/05/2012    Procedure: CORONARY ARTERY BYPASS GRAFTING (CABG);  Surgeon: Kerin Perna, MD;  Location: [facility] OR;  Service: Open Heart Surgery;  Laterality: N/A;  cannulate right subclavian  . Replacement ascending aorta  11/05/2012    Procedure: REPLACEMENT ASCENDING AORTA;  Surgeon: Kerin Perna, MD;  Location: [facility] OR;  Service: Open Heart Surgery;  Laterality: N/A;  . Intraoperative transesophageal echocardiogram  11/05/2012    Procedure: INTRAOPERATIVE TRANSESOPHAGEAL ECHOCARDIOGRAM;  Surgeon: Kerin Perna, MD;  Location: [facility] OR;  Service: Open Heart Surgery;  Laterality: N/A;  . Endovein harvest of greater saphenous vein  11/05/2012    Procedure: ENDOVEIN HARVEST OF  GREATER SAPHENOUS VEIN;  Surgeon: Kerin Perna, MD;  Location: [facility] OR;  Service: Open Heart Surgery;  Laterality: Right;  . Pericardial window N/A 11/21/2012    Procedure: PERICARDIAL WINDOW;  Surgeon: Kerin Perna, MD;  Location: [facility] OR;  Service: Thoracic;  Laterality: N/A;  . Intraoperative transesophageal echocardiogram N/A 11/21/2012    Procedure: INTRAOPERATIVE TRANSESOPHAGEAL ECHOCARDIOGRAM;  Surgeon: Kerin Perna, MD;  Location: [facility] OR;  Service: Open  Heart Surgery;  Laterality: N/A;  . Subclavian bypass graft  05/16/1999    left subclavian bypass with 77mm Darcon graft (Dr. Amada Kingfisher)  . Cardiac catheterization  08/1999    normal L main, LAD & first diagonal are normal, normal Cfx, RCA with mild ostial narrowing, normal LV systolic function, severe AI, mild MR, normal PAP (Dr. Mervyn Skeeters. Little)   . Aortic valve replacement  08/23/1999    9mm St. Jude, Bentall procedure, root replacemtn   . Subclavian angiogram  05/10/2000    left subclavian arteriogram; patent graft, mild stenosis (Dr. Amada Kingfisher)   . Mediastinoscopy  08/2005    bronchoscopy & mediastinoscopy for mediastinal adenopathy (Dr. Donata Clay)   . Nm myocar perf wall motion  01/2012    lexiscan; small, fixed apical septal breast attenuation artifact & inferolateral bowel attenuation, no reversible ischemia, post-stress EF 64%, low risk scan   . Transthoracic echocardiogram  11/20/2012    EF 65-70%, mild conc LVH, grade 2 diastolic dysfunction, mech AV, mild MR    FAMHx:  Family History  Problem Relation Age of Onset  . Hypertension Father     SOCHx:   reports that she has never smoked. She has never used smokeless tobacco. She reports that she does not drink alcohol. Her drug history is not on file.  ALLERGIES:  Allergies  Allergen Reactions  . Contrast Media [Iodinated Diagnostic Agents] Hives, Itching and Swelling    ROS: A comprehensive review of systems was negative except for: Constitutional: positive for fatigue Cardiovascular: positive for chest pain  HOME MEDS: Current Outpatient Prescriptions  Medication Sig Dispense Refill  . Acetaminophen (TYLENOL PO) Take by mouth as needed.      . metoprolol tartrate (LOPRESSOR) 25 MG tablet TAKE 1/2 TABLET 2 TIMES A DAY  30 tablet  11  . omeprazole (PRILOSEC) 20 MG capsule Take 1 capsule (20 mg total) by mouth daily.  30 capsule  6  . OVER THE COUNTER MEDICATION daily. Menopause support - vit D, vit E, thiamin, riboflavin, niacin,  vit B6, calcium, phosphorus, selenium, chromium      . pravastatin (PRAVACHOL) 80 MG tablet Take 80 mg by mouth daily.      Marland Kitchen warfarin (COUMADIN) 5 MG tablet TAKE 1 TABLET BY MOUTH EVERY DAY OR AS DIRECTED  30 tablet  2   No current facility-administered medications for this visit.    LABS/IMAGING: No results found for this or any previous visit (from the past 48 hour(s)). No results found.  VITALS: BP 140/80  Pulse 62  Ht 5' 6.5" (1.689 m)  Wt 156 lb 3.2 oz (70.852 kg)  BMI 24.84 kg/m2  EXAM: General appearance: alert and no distress Neck: no carotid bruit and no JVD Lungs: clear to auscultation bilaterally Heart: regular rate and rhythm and sharp mechanical valve sounds Abdomen: soft, non-tender; bowel sounds normal; no masses,  no organomegaly Extremities: extremities normal, atraumatic, no cyanosis or edema Pulses: 2+ and symmetric Skin: Skin color, texture, turgor normal. No rashes or lesions Neurologic: Grossly normal Psych: Pleasant,  normal  EKG: Normal sinus rhythm at 62  ASSESSMENT: 1. Coronary artery disease status post two-vessel CABG in 11/2012 (LIMA to LAD, SVG to circumflex). 2. 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 Status post pericardial window with hematoma evacuation 4. History of left subclavian stenosis status post bypass in 2001 5. Rheumatoid arthritis 6. Sarcoidosis 7. Dyslipidemia 8. Hypertension  PLAN: 1.   [name] is doing extremely well after a long and complicated hospital stay earlier last year. She has not followed up postoperatively and is well overdue for repeat imaging. It has been 1 years since her surgery next month, therefore would recommend a repeat echocardiogram to evaluate her valvular gradient and ejection fraction. In addition she is due for repeat CT aortogram to evaluate for stability of her aortic root. This is requested prior to followup with Dr. Donata Clay and we have encouraged  her to arrange an appointment with him next month. We'll also go ahead and obtain a lipid profile, CBC and CMP. Plan to continue her current medications for now.  Her blood pressure is running high normal therefore she may benefit from the addition of low-dose ACE inhibitor or ARB, in addition for her history of coronary disease and as it may provide some benefit to stabilize her aortic root. I'll await her renal function studies before starting this. Plan to see her back in 6 months or sooner as necessary.  Chrystie Nose, MD, [facility] Attending Cardiologist [facility]  HILTY,Kenneth C 10/14/2013, 9:24 AM

## 2013-10-15 LAB — NMR LIPOPROFILE WITH LIPIDS
Cholesterol, Total: 173 mg/dL (ref <200)
HDL Particle Number: 45.7 umol/L (ref >=30.5)
HDL SIZE: 9.8 nm (ref >=9.2)
HDL-C: 79 mg/dL (ref >=40)
LARGE HDL: 16 umol/L (ref >=4.8)
LDL (calc): 79 mg/dL (ref <100)
LDL Particle Number: 901 nmol/L (ref <1000)
LDL SIZE: 20.7 nm (ref >20.5)
LP-IR Score: <25 (ref <=45)
Small LDL Particle Number: 268 nmol/L (ref <=527)
Triglycerides: 75 mg/dL (ref <150)
VLDL Size: 41.4 nm (ref <=46.6)

## 2013-10-17 ENCOUNTER — Ambulatory Visit: Payer: PRIVATE HEALTH INSURANCE | Admitting: Pharmacist Clinician (PhC)/ Clinical Pharmacy Specialist

## 2013-10-20 ENCOUNTER — Telehealth: Payer: Self-pay | Admitting: *Deleted

## 2013-10-20 MED ORDER — PREDNISONE 50 MG PO TABS: ORAL_TABLET | ORAL | Status: DC

## 2013-10-20 MED ORDER — LISINOPRIL 10 MG PO TABS: 10.0000 mg | ORAL_TABLET | Freq: Every day | ORAL | Status: DC

## 2013-10-20 MED ORDER — DIPHENHYDRAMINE HCL 50 MG PO TABS: ORAL_TABLET | ORAL | Status: DC

## 2013-10-20 MED ORDER — POLYSACCHARIDE IRON COMPLEX 150 MG PO CAPS: 150.0000 mg | ORAL_CAPSULE | Freq: Every day | ORAL | Status: DC

## 2013-10-20 NOTE — Telephone Encounter (Signed)
Message copied by Lindell Spar on Mon Oct 20, 2013  4:49 PM ------      Message from: Chrystie Nose      Created: Mon Oct 20, 2013  3:13 PM       Please notify patient that the lab results show good cholesterol, kidney and liver function. The only issue is that she is more anemic.  I would recommend starting her on Nu-Iron 150 mg BID. She should take colace OTC if she has constipation with this.            -Dr. Rennis Golden       ------

## 2013-10-20 NOTE — Telephone Encounter (Signed)
Called patient with lab results. Instructed to start iron supplement 150mg  BID per Dr. , also lisinoprol 10mg  daily.   Instructed patient on pre-procedure medications for CT angio.. prednisone 50mg  13hr, 7 hr, 1 hr prior to test and benadryl 50mg  1 hr prior to test.

## 2013-10-22 ENCOUNTER — Ambulatory Visit (HOSPITAL_COMMUNITY)
Admission: RE | Admit: 2013-10-22 | Discharge: 2013-10-22 | Disposition: A | Discharge status: Home / Self Care | Payer: PRIVATE HEALTH INSURANCE | Source: Ambulatory Visit | Attending: Cardiovascular Disease | Admitting: Cardiovascular Disease

## 2013-10-22 DIAGNOSIS — Z8679 Personal history of other diseases of the circulatory system: Secondary | ICD-10-CM

## 2013-10-22 DIAGNOSIS — I10 Essential (primary) hypertension: Secondary | ICD-10-CM | POA: Insufficient documentation

## 2013-10-22 DIAGNOSIS — I059 Rheumatic mitral valve disease, unspecified: Secondary | ICD-10-CM | POA: Insufficient documentation

## 2013-10-22 DIAGNOSIS — Z9889 Other specified postprocedural states: Secondary | ICD-10-CM

## 2013-10-22 DIAGNOSIS — E785 Hyperlipidemia, unspecified: Secondary | ICD-10-CM | POA: Insufficient documentation

## 2013-10-22 DIAGNOSIS — I253 Aneurysm of heart: Secondary | ICD-10-CM | POA: Insufficient documentation

## 2013-10-22 DIAGNOSIS — Z951 Presence of aortocoronary bypass graft: Secondary | ICD-10-CM | POA: Insufficient documentation

## 2013-10-23 ENCOUNTER — Telehealth: Payer: Self-pay | Admitting: Internal Medicine

## 2013-10-23 NOTE — Telephone Encounter (Addendum)
lmsg for pt to call.  She has ct angio scheduled for 11/10/13 @ 9:00 at [facility].  Needs to arrive @ 8:45 [facility] - first floor radiology. Clear liquids only 4 hours prior to the procedure. She can take her meds. [name] has spoken with her about her prep for contrast allergy.

## 2013-10-27 NOTE — Progress Notes (Signed)
2D Echo Performed 10/27/2013    Herbert Marken, RCS  

## 2013-11-03 ENCOUNTER — Other Ambulatory Visit: Payer: Self-pay

## 2013-11-03 DIAGNOSIS — I359 Nonrheumatic aortic valve disorder, unspecified: Secondary | ICD-10-CM

## 2013-11-10 ENCOUNTER — Encounter (HOSPITAL_COMMUNITY): Payer: Self-pay

## 2013-11-10 ENCOUNTER — Ambulatory Visit (HOSPITAL_COMMUNITY)
Admission: RE | Admit: 2013-11-10 | Discharge: 2013-11-10 | Disposition: A | Discharge status: Home / Self Care | Payer: PRIVATE HEALTH INSURANCE | Source: Ambulatory Visit | Attending: Internal Medicine | Admitting: Internal Medicine

## 2013-11-10 DIAGNOSIS — I714 Abdominal aortic aneurysm, without rupture, unspecified: Secondary | ICD-10-CM | POA: Insufficient documentation

## 2013-11-10 DIAGNOSIS — Z951 Presence of aortocoronary bypass graft: Secondary | ICD-10-CM | POA: Insufficient documentation

## 2013-11-10 DIAGNOSIS — Z9889 Other specified postprocedural states: Secondary | ICD-10-CM

## 2013-11-10 DIAGNOSIS — R911 Solitary pulmonary nodule: Secondary | ICD-10-CM | POA: Insufficient documentation

## 2013-11-10 DIAGNOSIS — I712 Thoracic aortic aneurysm, without rupture, unspecified: Secondary | ICD-10-CM | POA: Insufficient documentation

## 2013-11-10 DIAGNOSIS — Z8679 Personal history of other diseases of the circulatory system: Secondary | ICD-10-CM

## 2013-11-10 MED ORDER — IOHEXOL 350 MG/ML SOLN
100.0000 mL | Freq: Once | INTRAVENOUS | Status: AC | PRN
  Administered 2013-11-10: 100 mL via INTRAVENOUS

## 2013-11-17 ENCOUNTER — Other Ambulatory Visit: Payer: Self-pay | Admitting: *Deleted

## 2013-11-17 ENCOUNTER — Other Ambulatory Visit: Payer: Self-pay | Admitting: Internal Medicine

## 2013-11-17 MED ORDER — PRAVASTATIN SODIUM 80 MG PO TABS: 80.0000 mg | ORAL_TABLET | Freq: Every day | ORAL | Status: DC

## 2013-11-19 NOTE — Progress Notes (Signed)
LMTCB - will need annual CTA f/up

## 2013-11-20 ENCOUNTER — Telehealth: Payer: Self-pay | Admitting: Internal Medicine

## 2013-11-20 DIAGNOSIS — Z9889 Other specified postprocedural states: Secondary | ICD-10-CM

## 2013-11-20 DIAGNOSIS — Z952 Presence of prosthetic heart valve: Secondary | ICD-10-CM

## 2013-11-20 NOTE — Telephone Encounter (Signed)
Returning your call,please call after 1 today please.

## 2013-11-20 NOTE — Telephone Encounter (Signed)
Patient notified of test results and informed that this test will need to be performed again in 1 year.

## 2013-12-03 ENCOUNTER — Ambulatory Visit: Payer: PRIVATE HEALTH INSURANCE | Admitting: Cardiothoracic Surgery

## 2013-12-10 ENCOUNTER — Encounter: Payer: Self-pay | Admitting: Cardiothoracic Surgery

## 2013-12-10 ENCOUNTER — Ambulatory Visit (INDEPENDENT_AMBULATORY_CARE_PROVIDER_SITE_OTHER): Payer: PRIVATE HEALTH INSURANCE | Admitting: Cardiothoracic Surgery

## 2013-12-10 VITALS — BP 108/57 | HR 56 | Resp 16 | Ht 66.0 in | Wt 159.0 lb

## 2013-12-10 DIAGNOSIS — Z8679 Personal history of other diseases of the circulatory system: Secondary | ICD-10-CM

## 2013-12-10 DIAGNOSIS — I712 Thoracic aortic aneurysm, without rupture, unspecified: Secondary | ICD-10-CM

## 2013-12-10 DIAGNOSIS — Z951 Presence of aortocoronary bypass graft: Secondary | ICD-10-CM

## 2013-12-10 DIAGNOSIS — I2510 Atherosclerotic heart disease of native coronary artery without angina pectoris: Secondary | ICD-10-CM

## 2013-12-10 DIAGNOSIS — Z9889 Other specified postprocedural states: Secondary | ICD-10-CM

## 2013-12-10 NOTE — Progress Notes (Signed)
PCP is [name], MD Referring Provider is [name], MD  Chief Complaint  Patient presents with  . 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 The patient had a mechanical Bentall procedure in 2000. Prior to that she had a left subclavian to left carotid bypass for left subclavian stenosis.  CTA today shows an intact root from the prior Bentall and a reconstructed ascending aorta to the proximal arch without abnormality. The distal arch and descending aorta are less than 4 cm. The upper abdominal aorta measures approximately 3.5 cm and will need to be followed. The patient's blood pressure is been under good control and she is a nonsmoker. She takes Coumadin chronically for the mechanical aVR.  The patient feels well and is back to work. Right-hand weakness is completely resolved. 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   Past Medical History  Diagnosis Date  . Coronary artery disease     mech AVR, CABG  . Anginal pain   . Shortness of breath   . S/P AVR (aortic valve replacement) 08/1999    St. Jude AVR for aortic insuff  . Subclavian artery stenosis 06/2000    left subclavian bypass  . 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 Donata Clay) - complicate by pericardial hematoma --> subxiphoid pericardial exposure and evacuation  . Hypertension   . Hyperlipidemia   . RA (rheumatoid arthritis)   . Sarcoid     Past Surgical History  Procedure Laterality Date  . Coronary artery bypass graft  11/05/2012    Procedure: CORONARY ARTERY BYPASS GRAFTING (CABG);  Surgeon: Kerin Perna, MD;  Location: [facility] OR;  Service: Open Heart Surgery;  Laterality: N/A;  cannulate right subclavian  . Replacement ascending  aorta  11/05/2012    Procedure: REPLACEMENT ASCENDING AORTA;  Surgeon: Kerin Perna, MD;  Location: [facility] OR;  Service: Open Heart Surgery;  Laterality: N/A;  . Intraoperative transesophageal echocardiogram  11/05/2012    Procedure: INTRAOPERATIVE TRANSESOPHAGEAL ECHOCARDIOGRAM;  Surgeon: Kerin Perna, MD;  Location: [facility] OR;  Service: Open Heart Surgery;  Laterality: N/A;  . Endovein harvest of greater saphenous vein  11/05/2012    Procedure: ENDOVEIN HARVEST OF GREATER SAPHENOUS VEIN;  Surgeon: Kerin Perna, MD;  Location: [facility] OR;  Service: Open Heart Surgery;  Laterality: Right;  . Pericardial window N/A 11/21/2012    Procedure: PERICARDIAL WINDOW;  Surgeon: Kerin Perna, MD;  Location: [facility] OR;  Service: Thoracic;  Laterality: N/A;  . Intraoperative transesophageal echocardiogram N/A 11/21/2012    Procedure: INTRAOPERATIVE TRANSESOPHAGEAL ECHOCARDIOGRAM;  Surgeon: Kerin Perna, MD;  Location: [facility] OR;  Service: Open Heart Surgery;  Laterality: N/A;  . Subclavian bypass graft  05/16/1999    left subclavian bypass with 45mm Darcon graft (Dr. Amada Kingfisher)  . Cardiac catheterization  08/1999    normal L main, LAD & first diagonal are normal, normal Cfx, RCA with mild ostial narrowing, normal LV systolic function, severe AI, mild MR, normal PAP (Dr. Mervyn Skeeters. Little)   . Aortic valve replacement  08/23/1999    90mm St. Jude, Bentall procedure, root replacemtn   . Subclavian angiogram  05/10/2000    left subclavian arteriogram; patent graft, mild stenosis (  Dr. Amada Kingfisher)   . Mediastinoscopy  08/2005    bronchoscopy & mediastinoscopy for mediastinal adenopathy (Dr. Donata Clay)   . Nm myocar perf wall motion  01/2012    lexiscan; small, fixed apical septal breast attenuation artifact & inferolateral bowel attenuation, no reversible ischemia, post-stress EF 64%, low risk scan   . Transthoracic echocardiogram  11/20/2012    EF 65-70%, mild conc LVH, grade 2 diastolic dysfunction, mech AV, mild MR    Family History   Problem Relation Age of Onset  . Hypertension Father     Social History History  Substance Use Topics  . Smoking status: Never Smoker   . Smokeless tobacco: Never Used  . Alcohol Use: No    Current Outpatient Prescriptions  Medication Sig Dispense Refill  . Acetaminophen (TYLENOL PO) Take by mouth as needed.      . iron polysaccharides (NIFEREX) 150 MG capsule Take 1 capsule (150 mg total) by mouth daily.  60 capsule  11  . lisinopril (PRINIVIL,ZESTRIL) 10 MG tablet Take 1 tablet (10 mg total) by mouth daily.  30 tablet  11  . metoprolol tartrate (LOPRESSOR) 25 MG tablet TAKE 1/2 TABLET 2 TIMES A DAY  30 tablet  11  . omeprazole (PRILOSEC) 20 MG capsule TAKE 1 CAPSULE (20 MG TOTAL) BY MOUTH DAILY.  30 capsule  6  . OVER THE COUNTER MEDICATION daily. Menopause support - vit D, vit E, thiamin, riboflavin, niacin, vit B6, calcium, phosphorus, selenium, chromium      . pravastatin (PRAVACHOL) 80 MG tablet Take 1 tablet (80 mg total) by mouth daily.  30 tablet  9  . warfarin (COUMADIN) 5 MG tablet TAKE 1 TABLET BY MOUTH EVERY DAY OR AS DIRECTED  30 tablet  2   No current facility-administered medications for this visit.    Allergies  Allergen Reactions  . Contrast Media [Iodinated Diagnostic Agents] Hives, Itching and Swelling    Review of Systems no complaints of angina or CHF no bleeding complications from Coumadin Patient will begin taking a 81 mg aspirin in addition to her Coumadin for vascular endothelial health  BP 108/57  Pulse 56  Resp 16  Ht 5\' 6"  (1.676 m)  Wt 159 lb (72.122 kg)  BMI 25.68 kg/m2  SpO2 99% Physical Exam Alert and comfortable Lungs clear No JVD Good pulses in extremities Heart rate regular, sharp aortic valve closure sound  Diagnostic Tests: CT scan from last month reviewed showing intact ascending aortic and hemi-arch repair. The upper abdominal aorta measures 3.5 cm and should be followed. We'll refer to [facility]  Impression: Doing well one year  after review replacement ascending aneurysm  Plan: Return as needed

## 2014-01-01 ENCOUNTER — Encounter: Payer: Self-pay | Admitting: Surgery

## 2014-01-02 ENCOUNTER — Ambulatory Visit (INDEPENDENT_AMBULATORY_CARE_PROVIDER_SITE_OTHER): Payer: PRIVATE HEALTH INSURANCE | Admitting: Pharmacist Clinician (PhC)/ Clinical Pharmacy Specialist

## 2014-01-02 VITALS — BP 110/64 | HR 64

## 2014-01-02 DIAGNOSIS — Z954 Presence of other heart-valve replacement: Secondary | ICD-10-CM

## 2014-01-02 DIAGNOSIS — Z7901 Long term (current) use of anticoagulants: Secondary | ICD-10-CM

## 2014-01-02 DIAGNOSIS — Z952 Presence of prosthetic heart valve: Secondary | ICD-10-CM

## 2014-01-02 LAB — POCT INR: INR: 2.9

## 2014-01-05 ENCOUNTER — Encounter: Payer: PRIVATE HEALTH INSURANCE | Admitting: Surgery

## 2014-01-29 ENCOUNTER — Other Ambulatory Visit: Payer: Self-pay | Admitting: Internal Medicine

## 2014-01-29 ENCOUNTER — Other Ambulatory Visit: Payer: Self-pay | Admitting: Cardiothoracic Surgery

## 2014-01-29 NOTE — Telephone Encounter (Signed)
Rx was sent to pharmacy electronically. 

## 2014-01-30 ENCOUNTER — Ambulatory Visit (INDEPENDENT_AMBULATORY_CARE_PROVIDER_SITE_OTHER): Payer: PRIVATE HEALTH INSURANCE | Admitting: Pharmacist Clinician (PhC)/ Clinical Pharmacy Specialist

## 2014-01-30 VITALS — BP 108/52 | HR 64

## 2014-01-30 DIAGNOSIS — Z954 Presence of other heart-valve replacement: Secondary | ICD-10-CM

## 2014-01-30 DIAGNOSIS — Z7901 Long term (current) use of anticoagulants: Secondary | ICD-10-CM

## 2014-01-30 DIAGNOSIS — Z952 Presence of prosthetic heart valve: Secondary | ICD-10-CM

## 2014-01-30 LAB — POCT INR: INR: 2.4

## 2014-02-24 ENCOUNTER — Other Ambulatory Visit: Payer: Self-pay | Admitting: Cardiothoracic Surgery

## 2014-02-27 ENCOUNTER — Other Ambulatory Visit: Payer: Self-pay | Admitting: Cardiothoracic Surgery

## 2014-03-06 ENCOUNTER — Ambulatory Visit (INDEPENDENT_AMBULATORY_CARE_PROVIDER_SITE_OTHER): Payer: PRIVATE HEALTH INSURANCE | Admitting: Pharmacist Clinician (PhC)/ Clinical Pharmacy Specialist

## 2014-03-06 DIAGNOSIS — Z7901 Long term (current) use of anticoagulants: Secondary | ICD-10-CM

## 2014-03-06 DIAGNOSIS — Z952 Presence of prosthetic heart valve: Secondary | ICD-10-CM

## 2014-03-06 DIAGNOSIS — Z954 Presence of other heart-valve replacement: Secondary | ICD-10-CM

## 2014-03-06 LAB — POCT INR: INR: 3.1

## 2014-03-28 ENCOUNTER — Other Ambulatory Visit: Payer: Self-pay | Admitting: Pharmacist Clinician (PhC)/ Clinical Pharmacy Specialist

## 2014-04-01 ENCOUNTER — Other Ambulatory Visit: Payer: Self-pay | Admitting: Pharmacist Clinician (PhC)/ Clinical Pharmacy Specialist

## 2014-04-24 ENCOUNTER — Ambulatory Visit (INDEPENDENT_AMBULATORY_CARE_PROVIDER_SITE_OTHER): Payer: PRIVATE HEALTH INSURANCE | Admitting: Pharmacist Clinician (PhC)/ Clinical Pharmacy Specialist

## 2014-04-24 DIAGNOSIS — Z952 Presence of prosthetic heart valve: Secondary | ICD-10-CM

## 2014-04-24 DIAGNOSIS — Z7901 Long term (current) use of anticoagulants: Secondary | ICD-10-CM

## 2014-04-24 DIAGNOSIS — Z954 Presence of other heart-valve replacement: Secondary | ICD-10-CM

## 2014-04-24 LAB — POCT INR: INR: 2.9

## 2014-05-15 ENCOUNTER — Telehealth: Payer: Self-pay | Admitting: Pharmacist Clinician (PhC)/ Clinical Pharmacy Specialist

## 2014-05-15 NOTE — Telephone Encounter (Signed)
Pt called, bruised arm at work, was seen by staff medical, advised to apply heat.  Pt wanted [place] to know.

## 2014-05-15 NOTE — Telephone Encounter (Signed)
Returned call, pt reports lifted heavy box at work last Friday night, after work noticed pain in arm.  Had bruise from box.  When returned to work Monday, used same arm (R arm), started hurting again.  Saw company nurse, was told had bruise with small blood clot.  Was advised to apply heat at least once daily.  Advised patient this is good therapy, if continues to be painful or bruise increases, see RN.

## 2014-05-29 ENCOUNTER — Telehealth: Payer: Self-pay | Admitting: Pharmacist Clinician (PhC)/ Clinical Pharmacy Specialist

## 2014-05-29 NOTE — Telephone Encounter (Signed)
Pt called, LMOM that tongue was bleeding before work today.  Finally stopped, but started again when she drank a cup of hot coffee.    Returned call,  Pt states cannot see any cut on tongue, but has bled when drinking coffee today.  Advised her to drink only cold liquids, and if it continues to bleed, she should have it looked at by Urgent Care.  Pt voiced understanding.

## 2014-06-05 ENCOUNTER — Ambulatory Visit (INDEPENDENT_AMBULATORY_CARE_PROVIDER_SITE_OTHER): Payer: PRIVATE HEALTH INSURANCE | Admitting: Pharmacist Clinician (PhC)/ Clinical Pharmacy Specialist

## 2014-06-05 DIAGNOSIS — Z954 Presence of other heart-valve replacement: Secondary | ICD-10-CM

## 2014-06-05 DIAGNOSIS — Z7901 Long term (current) use of anticoagulants: Secondary | ICD-10-CM

## 2014-06-05 DIAGNOSIS — Z952 Presence of prosthetic heart valve: Secondary | ICD-10-CM

## 2014-06-05 LAB — POCT INR: INR: 2.3

## 2014-07-10 ENCOUNTER — Other Ambulatory Visit: Payer: Self-pay

## 2014-07-10 DIAGNOSIS — Z1231 Encounter for screening mammogram for malignant neoplasm of breast: Secondary | ICD-10-CM

## 2014-07-17 ENCOUNTER — Ambulatory Visit (INDEPENDENT_AMBULATORY_CARE_PROVIDER_SITE_OTHER): Payer: Commercial Managed Care - PPO | Admitting: Pharmacist Clinician (PhC)/ Clinical Pharmacy Specialist

## 2014-07-17 DIAGNOSIS — Z7901 Long term (current) use of anticoagulants: Secondary | ICD-10-CM

## 2014-07-17 DIAGNOSIS — Z952 Presence of prosthetic heart valve: Secondary | ICD-10-CM

## 2014-07-17 DIAGNOSIS — Z954 Presence of other heart-valve replacement: Secondary | ICD-10-CM | POA: Diagnosis not present

## 2014-07-17 LAB — POCT INR: INR: 3.8

## 2014-07-24 ENCOUNTER — Ambulatory Visit
Admission: RE | Admit: 2014-07-24 | Discharge: 2014-07-24 | Disposition: A | Discharge status: Home / Self Care | Payer: Commercial Managed Care - PPO | Source: Ambulatory Visit

## 2014-07-24 DIAGNOSIS — Z1231 Encounter for screening mammogram for malignant neoplasm of breast: Secondary | ICD-10-CM

## 2014-08-21 ENCOUNTER — Ambulatory Visit: Payer: Commercial Managed Care - PPO | Admitting: Pharmacist Clinician (PhC)/ Clinical Pharmacy Specialist

## 2014-09-06 ENCOUNTER — Other Ambulatory Visit: Payer: Self-pay | Admitting: Internal Medicine

## 2014-09-07 NOTE — Telephone Encounter (Signed)
Rx was sent to pharmacy electronically. 

## 2014-09-10 ENCOUNTER — Encounter (HOSPITAL_COMMUNITY): Payer: Self-pay | Admitting: Internal Medicine

## 2014-09-10 ENCOUNTER — Other Ambulatory Visit: Payer: Self-pay | Admitting: Internal Medicine

## 2014-09-10 NOTE — Telephone Encounter (Signed)
Rx refill sent to patient pharmacy   

## 2014-11-06 ENCOUNTER — Other Ambulatory Visit: Payer: Self-pay | Admitting: Internal Medicine

## 2014-11-06 NOTE — Telephone Encounter (Signed)
Rx(s) sent to pharmacy electronically.  

## 2014-11-13 ENCOUNTER — Ambulatory Visit (INDEPENDENT_AMBULATORY_CARE_PROVIDER_SITE_OTHER): Payer: Commercial Managed Care - PPO | Admitting: Pharmacist Clinician (PhC)/ Clinical Pharmacy Specialist

## 2014-11-13 DIAGNOSIS — Z952 Presence of prosthetic heart valve: Secondary | ICD-10-CM

## 2014-11-13 DIAGNOSIS — Z7901 Long term (current) use of anticoagulants: Secondary | ICD-10-CM

## 2014-11-13 DIAGNOSIS — Z954 Presence of other heart-valve replacement: Secondary | ICD-10-CM | POA: Diagnosis not present

## 2014-11-13 LAB — POCT INR: INR: 2.6

## 2014-11-23 ENCOUNTER — Telehealth: Payer: Self-pay | Admitting: *Deleted

## 2014-11-23 MED ORDER — PREDNISONE 50 MG PO TABS: ORAL_TABLET | ORAL | Status: DC

## 2014-11-23 MED ORDER — DIPHENHYDRAMINE HCL 50 MG PO TABS: ORAL_TABLET | ORAL | Status: DC

## 2014-11-23 NOTE — Telephone Encounter (Signed)
Received call from [place] CT that pre-meds have not been ordered for CT. Stacy with CT had attempted to contact patient with no answer. RN attempted to call patient's home/mobile - no answer - LM. Work # goes straight to [employer] at place of employment  Pre-medications ordered per previous documentation prior to CT in 2015.  >> prednisone 50mg  13hr, 7 hr, 1 hr prior to test and benadryl 50mg  1 hr prior to test.  Rx(s) sent to pharmacy electronically.

## 2014-11-24 ENCOUNTER — Inpatient Hospital Stay: Admission: RE | Admit: 2014-11-24 | Payer: Commercial Managed Care - PPO | Source: Ambulatory Visit

## 2014-11-26 NOTE — Telephone Encounter (Signed)
LMTCB - CT has been rescheduled for 12/04/14 Need to inform patient of instructions for pre-medications for test - already ordered.

## 2014-11-30 NOTE — Telephone Encounter (Signed)
Spoke with patient. She voiced understanding of need to take pre-meds and has picked them up already.   She currently has a viral infection and will call if she needs to reschedule based on how she feels from illness.

## 2014-12-04 ENCOUNTER — Ambulatory Visit (INDEPENDENT_AMBULATORY_CARE_PROVIDER_SITE_OTHER)
Admission: RE | Admit: 2014-12-04 | Discharge: 2014-12-04 | Disposition: A | Discharge status: Home / Self Care | Payer: Commercial Managed Care - PPO | Source: Ambulatory Visit | Attending: Internal Medicine | Admitting: Internal Medicine

## 2014-12-04 DIAGNOSIS — Z954 Presence of other heart-valve replacement: Secondary | ICD-10-CM | POA: Diagnosis not present

## 2014-12-04 DIAGNOSIS — Z952 Presence of prosthetic heart valve: Secondary | ICD-10-CM

## 2014-12-04 DIAGNOSIS — Z9889 Other specified postprocedural states: Secondary | ICD-10-CM

## 2014-12-04 MED ORDER — IOHEXOL 350 MG/ML SOLN
100.0000 mL | Freq: Once | INTRAVENOUS | Status: AC | PRN
  Administered 2014-12-04: 100 mL via INTRAVENOUS

## 2014-12-08 ENCOUNTER — Telehealth: Payer: Self-pay | Admitting: Internal Medicine

## 2014-12-08 NOTE — Telephone Encounter (Signed)
LM with results per patient request

## 2014-12-08 NOTE — Telephone Encounter (Signed)
Returning your call from yesterday. Please leave results on her answering machine.

## 2014-12-14 ENCOUNTER — Other Ambulatory Visit: Payer: Self-pay | Admitting: Internal Medicine

## 2014-12-15 NOTE — Telephone Encounter (Signed)
Rx(s) sent to pharmacy electronically. Staff message sent to [facility], Dr. Blanchie Dessert scheduler, to contact patient for appointment

## 2014-12-25 ENCOUNTER — Ambulatory Visit (INDEPENDENT_AMBULATORY_CARE_PROVIDER_SITE_OTHER): Payer: Commercial Managed Care - PPO | Admitting: Pharmacist Clinician (PhC)/ Clinical Pharmacy Specialist

## 2014-12-25 DIAGNOSIS — Z952 Presence of prosthetic heart valve: Secondary | ICD-10-CM

## 2014-12-25 DIAGNOSIS — Z954 Presence of other heart-valve replacement: Secondary | ICD-10-CM

## 2014-12-25 DIAGNOSIS — Z7901 Long term (current) use of anticoagulants: Secondary | ICD-10-CM

## 2014-12-25 LAB — POCT INR: INR: 2.8

## 2015-01-01 ENCOUNTER — Other Ambulatory Visit: Payer: Self-pay | Admitting: Internal Medicine

## 2015-01-01 NOTE — Telephone Encounter (Signed)
Rx(s) sent to pharmacy electronically.  

## 2015-01-12 ENCOUNTER — Other Ambulatory Visit: Payer: Self-pay | Admitting: Internal Medicine

## 2015-01-12 NOTE — Telephone Encounter (Signed)
Rx(s) sent to pharmacy electronically.  

## 2015-01-18 ENCOUNTER — Other Ambulatory Visit: Payer: Self-pay | Admitting: Internal Medicine

## 2015-01-18 NOTE — Telephone Encounter (Signed)
Rx(s) sent to pharmacy electronically.  

## 2015-02-05 ENCOUNTER — Ambulatory Visit: Payer: Commercial Managed Care - PPO | Admitting: Pharmacist Clinician (PhC)/ Clinical Pharmacy Specialist

## 2015-02-12 ENCOUNTER — Other Ambulatory Visit: Payer: Self-pay | Admitting: Internal Medicine

## 2015-02-12 NOTE — Telephone Encounter (Signed)
Rx(s) sent to pharmacy electronically.  

## 2015-02-17 ENCOUNTER — Other Ambulatory Visit: Payer: Self-pay | Admitting: Internal Medicine

## 2015-02-17 NOTE — Telephone Encounter (Signed)
Rx(s) sent to pharmacy electronically.  

## 2015-02-19 ENCOUNTER — Ambulatory Visit (INDEPENDENT_AMBULATORY_CARE_PROVIDER_SITE_OTHER): Payer: Commercial Managed Care - PPO | Admitting: Pharmacist Clinician (PhC)/ Clinical Pharmacy Specialist

## 2015-02-19 DIAGNOSIS — Z952 Presence of prosthetic heart valve: Secondary | ICD-10-CM

## 2015-02-19 DIAGNOSIS — Z7901 Long term (current) use of anticoagulants: Secondary | ICD-10-CM | POA: Diagnosis not present

## 2015-02-19 DIAGNOSIS — Z954 Presence of other heart-valve replacement: Secondary | ICD-10-CM | POA: Diagnosis not present

## 2015-02-19 LAB — POCT INR: INR: 4.3

## 2015-02-22 ENCOUNTER — Other Ambulatory Visit: Payer: Self-pay | Admitting: Internal Medicine

## 2015-02-23 NOTE — Telephone Encounter (Signed)
Rx(s) sent to pharmacy electronically.  

## 2015-03-12 ENCOUNTER — Ambulatory Visit (INDEPENDENT_AMBULATORY_CARE_PROVIDER_SITE_OTHER): Payer: Commercial Managed Care - PPO | Admitting: Pharmacist Clinician (PhC)/ Clinical Pharmacy Specialist

## 2015-03-12 DIAGNOSIS — Z954 Presence of other heart-valve replacement: Secondary | ICD-10-CM | POA: Diagnosis not present

## 2015-03-12 DIAGNOSIS — Z7901 Long term (current) use of anticoagulants: Secondary | ICD-10-CM

## 2015-03-12 DIAGNOSIS — Z952 Presence of prosthetic heart valve: Secondary | ICD-10-CM

## 2015-03-12 LAB — POCT INR: INR: 3.1

## 2015-03-19 ENCOUNTER — Ambulatory Visit: Payer: Commercial Managed Care - PPO

## 2015-03-22 ENCOUNTER — Other Ambulatory Visit: Payer: Self-pay | Admitting: Internal Medicine

## 2015-03-23 NOTE — Telephone Encounter (Signed)
Rx(s) sent to pharmacy electronically.  

## 2015-04-05 ENCOUNTER — Other Ambulatory Visit: Payer: Self-pay | Admitting: Pharmacist Clinician (PhC)/ Clinical Pharmacy Specialist

## 2015-05-03 ENCOUNTER — Other Ambulatory Visit: Payer: Self-pay | Admitting: Pharmacist Clinician (PhC)/ Clinical Pharmacy Specialist

## 2015-05-07 ENCOUNTER — Other Ambulatory Visit: Payer: Self-pay | Admitting: Pharmacist Clinician (PhC)/ Clinical Pharmacy Specialist

## 2015-05-07 ENCOUNTER — Ambulatory Visit (INDEPENDENT_AMBULATORY_CARE_PROVIDER_SITE_OTHER): Payer: Commercial Managed Care - PPO | Admitting: Pharmacist Clinician (PhC)/ Clinical Pharmacy Specialist

## 2015-05-07 DIAGNOSIS — Z952 Presence of prosthetic heart valve: Secondary | ICD-10-CM

## 2015-05-07 DIAGNOSIS — Z7901 Long term (current) use of anticoagulants: Secondary | ICD-10-CM

## 2015-05-07 DIAGNOSIS — Z954 Presence of other heart-valve replacement: Secondary | ICD-10-CM

## 2015-05-07 LAB — POCT INR: INR: 4

## 2015-05-28 ENCOUNTER — Ambulatory Visit (INDEPENDENT_AMBULATORY_CARE_PROVIDER_SITE_OTHER): Payer: Commercial Managed Care - PPO | Admitting: Pharmacist Clinician (PhC)/ Clinical Pharmacy Specialist

## 2015-05-28 ENCOUNTER — Ambulatory Visit (INDEPENDENT_AMBULATORY_CARE_PROVIDER_SITE_OTHER): Payer: Commercial Managed Care - PPO | Admitting: Internal Medicine

## 2015-05-28 ENCOUNTER — Encounter: Payer: Self-pay | Admitting: Internal Medicine

## 2015-05-28 VITALS — BP 122/50 | HR 74 | Ht 66.5 in | Wt 167.9 lb

## 2015-05-28 DIAGNOSIS — I708 Atherosclerosis of other arteries: Secondary | ICD-10-CM

## 2015-05-28 DIAGNOSIS — Z954 Presence of other heart-valve replacement: Secondary | ICD-10-CM

## 2015-05-28 DIAGNOSIS — I714 Abdominal aortic aneurysm, without rupture, unspecified: Secondary | ICD-10-CM

## 2015-05-28 DIAGNOSIS — E785 Hyperlipidemia, unspecified: Secondary | ICD-10-CM | POA: Diagnosis not present

## 2015-05-28 DIAGNOSIS — I2583 Coronary atherosclerosis due to lipid rich plaque: Secondary | ICD-10-CM

## 2015-05-28 DIAGNOSIS — Z7901 Long term (current) use of anticoagulants: Secondary | ICD-10-CM | POA: Diagnosis not present

## 2015-05-28 DIAGNOSIS — Z952 Presence of prosthetic heart valve: Secondary | ICD-10-CM

## 2015-05-28 DIAGNOSIS — Z79899 Other long term (current) drug therapy: Secondary | ICD-10-CM | POA: Diagnosis not present

## 2015-05-28 DIAGNOSIS — I10 Essential (primary) hypertension: Secondary | ICD-10-CM

## 2015-05-28 DIAGNOSIS — I200 Unstable angina: Secondary | ICD-10-CM

## 2015-05-28 DIAGNOSIS — I739 Peripheral vascular disease, unspecified: Secondary | ICD-10-CM

## 2015-05-28 DIAGNOSIS — I2510 Atherosclerotic heart disease of native coronary artery without angina pectoris: Secondary | ICD-10-CM

## 2015-05-28 DIAGNOSIS — I771 Stricture of artery: Secondary | ICD-10-CM

## 2015-05-28 LAB — POCT INR: INR: 2.9

## 2015-05-28 MED ORDER — OMEPRAZOLE 20 MG PO CPDR: 20.0000 mg | DELAYED_RELEASE_CAPSULE | Freq: Every day | ORAL | Status: DC

## 2015-05-28 NOTE — Progress Notes (Signed)
OFFICE NOTE  Chief Complaint:  Follow-up  Primary Care Physician: [name], MD  HPI:  [name] is a very pleasant 64 year old African American female with a history of St. Jude AVR placed in 2000 for aortic insufficiency. 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 She also has a history of sarcoidosis, dyslipidemia and contrast allergy, which apparently gave her hives. In April 2013, she saw Dr. Clarene Duke with complaints of chest pain, for which he then did a Myoview on her, which was normal. She was last seen by Corine Shelter on October 21, 2012. At that time she was complaining of having chest pressure and shortness of breath. She was set up for a diagnostic heart catheterization. This was completed by Dr. Rennis Golden, which revealed severe ostial left main stenosis greater than 95%. There was also suggestion of possible ostial RCA disease. The ascending aorta was dilated up to 4.8 cm. At that time, Dr. Donata Clay was asked to consult. Patient was then taken for bypass surgery x2 with a saphenous vein graft to the left anterior descending coronary artery and a saphenous vein graft to the circumflex marginal. 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 Also during that hospitalization, she had development of a pericardial hematoma. Dr. Donata Clay then did a subxiphoid pericardial exposure and evacuation of the pericardial hematoma. She was seen in follow-up in March 2014 and was doing well.  She had lost about 17 lbs during her hospitalization.  She reports returning to work this past summer and is still able to do most activities. Her INR has been followed in our Coumadin clinic and is actually supratherapeutic today at 4. Her dose will be adjusted. She's not had any further laboratory work or followup since this past Spring.  She denies any chest pain with exertion, but does report some discomfort associated with her anterior  scars as well as her sternal wires. She does occasionally get fatigued with work but this improves with rest.  I saw [name] back today in follow-up. She is doing fairly well. She denies any shortness of breath or chest pain. She is annoyed somewhat at her mechanical valve which she says is very loud. She underwent a CT scan this year in March which shows a stable thoracic aneurysm repair at 4.0 cm. 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 In the past she's had left subclavian bypass and will need ultrasound follow-up of that. She's also not had any lab work in the past several months.  PMHx:  Past Medical History  Diagnosis Date  . Coronary artery disease     mech AVR, CABG  . Anginal pain   . Shortness of breath   . S/P AVR (aortic valve replacement) 08/1999    St. Jude AVR for aortic insuff  . Subclavian artery stenosis 06/2000    left subclavian bypass  . 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 Donata Clay) - complicate by pericardial hematoma --> subxiphoid pericardial exposure and evacuation  . Hypertension   . Hyperlipidemia   . RA (rheumatoid arthritis)   . Sarcoid     Past Surgical History  Procedure Laterality Date  . Coronary artery bypass graft  11/05/2012    Procedure: CORONARY ARTERY BYPASS GRAFTING (CABG);  Surgeon: Kathlee Nations Trigt,  MD;  Location: [facility] OR;  Service: Open Heart Surgery;  Laterality: N/A;  cannulate right subclavian  . Replacement ascending aorta  11/05/2012    Procedure: REPLACEMENT ASCENDING AORTA;  Surgeon: Kerin Perna, MD;  Location: [facility] OR;  Service: Open Heart Surgery;  Laterality: N/A;  . Intraoperative transesophageal echocardiogram  11/05/2012    Procedure: INTRAOPERATIVE TRANSESOPHAGEAL ECHOCARDIOGRAM;  Surgeon: Kerin Perna, MD;  Location: [facility] OR;  Service: Open Heart Surgery;  Laterality: N/A;  . Endovein harvest of  greater saphenous vein  11/05/2012    Procedure: ENDOVEIN HARVEST OF GREATER SAPHENOUS VEIN;  Surgeon: Kerin Perna, MD;  Location: [facility] OR;  Service: Open Heart Surgery;  Laterality: Right;  . Pericardial window N/A 11/21/2012    Procedure: PERICARDIAL WINDOW;  Surgeon: Kerin Perna, MD;  Location: [facility] OR;  Service: Thoracic;  Laterality: N/A;  . Intraoperative transesophageal echocardiogram N/A 11/21/2012    Procedure: INTRAOPERATIVE TRANSESOPHAGEAL ECHOCARDIOGRAM;  Surgeon: Kerin Perna, MD;  Location: [facility] OR;  Service: Open Heart Surgery;  Laterality: N/A;  . Subclavian bypass graft  05/16/1999    left subclavian bypass with 51mm Darcon graft (Dr. Amada Kingfisher)  . Cardiac catheterization  08/1999    normal L main, LAD & first diagonal are normal, normal Cfx, RCA with mild ostial narrowing, normal LV systolic function, severe AI, mild MR, normal PAP (Dr. Mervyn Skeeters. [place])   . Aortic valve replacement  08/23/1999    52mm St. Jude, Bentall procedure, root replacemtn   . Subclavian angiogram  05/10/2000    left subclavian arteriogram; patent graft, mild stenosis (Dr. Amada Kingfisher)   . Mediastinoscopy  08/2005    bronchoscopy & mediastinoscopy for mediastinal adenopathy (Dr. Donata Clay)   . Nm myocar perf wall motion  01/2012    lexiscan; small, fixed apical septal breast attenuation artifact & inferolateral bowel attenuation, no reversible ischemia, post-stress EF 64%, low risk scan   . Transthoracic echocardiogram  11/20/2012    EF 65-70%, mild conc LVH, grade 2 diastolic dysfunction, mech AV, mild MR  . Left heart catheterization with coronary angiogram N/A 11/04/2012    Procedure: LEFT HEART CATHETERIZATION WITH CORONARY ANGIOGRAM;  Surgeon: Chrystie Nose, MD;  Location: [facility] CATH LAB;  Service: Cardiovascular;  Laterality: N/A;    FAMHx:  Family History  Problem Relation Age of Onset  . Hypertension Father     SOCHx:   reports that she has never smoked. She has never used smokeless tobacco. She reports  that she does not drink alcohol. Her drug history is not on file.  ALLERGIES:  Allergies  Allergen Reactions  . Contrast Media [Iodinated Diagnostic Agents] Hives, Itching and Swelling    ROS: A comprehensive review of systems was negative except for: Constitutional: positive for fatigue  HOME MEDS: Current Outpatient Prescriptions  Medication Sig Dispense Refill  . Acetaminophen (TYLENOL PO) Take by mouth as needed.    . diphenhydrAMINE (BENADRYL) 50 MG tablet Take 1 tablet (50mg ) by mouth 1 hour prior to test. 1 tablet 0  . iron polysaccharides (NIFEREX) 150 MG capsule Take 1 capsule (150 mg total) by mouth daily. 60 capsule 11  . leflunomide (ARAVA) 20 MG tablet Take 20 mg by mouth daily.  2  . omeprazole (PRILOSEC) 20 MG capsule Take 1 capsule (20 mg total) by mouth daily. 30 capsule 11  . OVER THE COUNTER MEDICATION daily. Menopause support - vit D, vit E, thiamin, riboflavin, niacin, vit B6, calcium, phosphorus, selenium, chromium    .  pravastatin (PRAVACHOL) 80 MG tablet Take 1 tablet (80 mg total) by mouth daily. 30 tablet 9  . warfarin (COUMADIN) 5 MG tablet TAKE 1 TABLET BY MOUTH EVERY DAY OR AS DIRECTED 90 tablet 1   No current facility-administered medications for this visit.    LABS/IMAGING: Results for orders placed or performed in visit on 05/28/15 (from the past 48 hour(s))  POCT INR     Status: None   Collection Time: 05/28/15  9:22 AM  Result Value Ref Range   INR 2.9    No results found.  VITALS: BP 122/50 mmHg  Pulse 74  Ht 5' 6.5" (1.689 m)  Wt 167 lb 14.4 oz (76.159 kg)  BMI 26.70 kg/m2  EXAM: General appearance: alert and no distress Neck: no carotid bruit and no JVD Lungs: clear to auscultation bilaterally Heart: regular rate and rhythm and sharp mechanical valve sounds Abdomen: soft, non-tender; bowel sounds normal; no masses,  no organomegaly Extremities: extremities normal, atraumatic, no cyanosis or edema Pulses: 2+ and symmetric Skin:  Skin color, texture, turgor normal. No rashes or lesions Neurologic: Grossly normal Psych: Pleasant, normal  EKG: Normal sinus rhythm at 74, minimal voltage criteria for LVH, nonspecific T-wave changes  ASSESSMENT: 1. Coronary artery disease status post two-vessel CABG in 11/2012 (LIMA to LAD, SVG to circumflex). 2. 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 Status post pericardial window with hematoma evacuation 4. History of left subclavian stenosis status post bypass in 2001 5. Rheumatoid arthritis 6. Sarcoidosis 7. Dyslipidemia 8. Hypertension  PLAN: 1.   Mrs. Perren is doing extremely well after a long and complicated hospital stay earlier last year. CT scan this year shows stable changes of her ascending aortic aneurysm repair. Her INR is been therapeutic and she is having no issues with her heart valve. She is due for repeat check of lipid profile and metabolic profile which I'll order today. She also needs repeat current screening aneurysm studies including ultrasound of the abdominal aorta which measures 3.5 cm and her left subclavian bypass is not been assessed in some time. Plan to see her back in 6 months and will contact her if anything additional needs to be done regarding her ultrasound.  Chrystie Nose, MD, [facility] Attending Cardiologist [facility]  Lisette Abu [facility] 05/28/2015, 10:23 AM

## 2015-05-28 NOTE — Patient Instructions (Addendum)
Your physician has requested that you have a carotid duplex. This test is an ultrasound of the carotid arteries in your neck. It looks at blood flow through these arteries that supply the brain with blood. Allow one hour for this exam. There are no restrictions or special instructions.  Your physician has requested that you have an abdominal aorta duplex. During this test, an ultrasound is used to evaluate the aorta. Allow 30 minutes for this exam. Do not eat after midnight the day before and avoid carbonated beverages  Your physician recommends that you return for lab work FASTING  Your physician wants you to follow-up in: 6 months with Dr. Rennis Golden. You will receive a reminder letter in the mail two months in advance. If you don't receive a letter, please call our office to schedule the follow-up appointment.  Your omeprazole has been refilled.  Dr. Rennis Golden said to STAY OFF LISINOPRIL - your BP was fine in the office today.

## 2015-06-11 ENCOUNTER — Other Ambulatory Visit (HOSPITAL_COMMUNITY): Payer: Commercial Managed Care - PPO

## 2015-06-11 ENCOUNTER — Encounter (HOSPITAL_COMMUNITY): Payer: Commercial Managed Care - PPO

## 2015-06-25 ENCOUNTER — Ambulatory Visit (INDEPENDENT_AMBULATORY_CARE_PROVIDER_SITE_OTHER): Payer: Commercial Managed Care - PPO | Admitting: Pharmacist Clinician (PhC)/ Clinical Pharmacy Specialist

## 2015-06-25 DIAGNOSIS — Z954 Presence of other heart-valve replacement: Secondary | ICD-10-CM | POA: Diagnosis not present

## 2015-06-25 DIAGNOSIS — Z7901 Long term (current) use of anticoagulants: Secondary | ICD-10-CM | POA: Diagnosis not present

## 2015-06-25 DIAGNOSIS — Z952 Presence of prosthetic heart valve: Secondary | ICD-10-CM

## 2015-06-25 LAB — POCT INR: INR: 2.4

## 2015-07-15 ENCOUNTER — Ambulatory Visit (HOSPITAL_COMMUNITY)
Admission: RE | Admit: 2015-07-15 | Discharge: 2015-07-15 | Disposition: A | Discharge status: Home / Self Care | Payer: Commercial Managed Care - PPO | Source: Ambulatory Visit | Attending: Cardiovascular Disease | Admitting: Cardiovascular Disease

## 2015-07-15 ENCOUNTER — Ambulatory Visit (HOSPITAL_BASED_OUTPATIENT_CLINIC_OR_DEPARTMENT_OTHER)
Admission: RE | Admit: 2015-07-15 | Discharge: 2015-07-15 | Disposition: A | Discharge status: Home / Self Care | Payer: Commercial Managed Care - PPO | Source: Ambulatory Visit | Attending: Internal Medicine | Admitting: Internal Medicine

## 2015-07-15 ENCOUNTER — Ambulatory Visit (INDEPENDENT_AMBULATORY_CARE_PROVIDER_SITE_OTHER): Payer: Commercial Managed Care - PPO | Admitting: Pharmacist

## 2015-07-15 DIAGNOSIS — I714 Abdominal aortic aneurysm, without rupture, unspecified: Secondary | ICD-10-CM

## 2015-07-15 DIAGNOSIS — I708 Atherosclerosis of other arteries: Secondary | ICD-10-CM | POA: Insufficient documentation

## 2015-07-15 DIAGNOSIS — Z954 Presence of other heart-valve replacement: Secondary | ICD-10-CM

## 2015-07-15 DIAGNOSIS — I6521 Occlusion and stenosis of right carotid artery: Secondary | ICD-10-CM | POA: Insufficient documentation

## 2015-07-15 DIAGNOSIS — I771 Stricture of artery: Secondary | ICD-10-CM

## 2015-07-15 DIAGNOSIS — I700 Atherosclerosis of aorta: Secondary | ICD-10-CM | POA: Diagnosis not present

## 2015-07-15 DIAGNOSIS — Z952 Presence of prosthetic heart valve: Secondary | ICD-10-CM

## 2015-07-15 DIAGNOSIS — Z7901 Long term (current) use of anticoagulants: Secondary | ICD-10-CM | POA: Diagnosis not present

## 2015-07-15 LAB — COMPREHENSIVE METABOLIC PANEL
ALT: 36 U/L — ABNORMAL HIGH (ref 6–29)
AST: 48 U/L — ABNORMAL HIGH (ref 10–35)
Albumin: 3.9 g/dL (ref 3.6–5.1)
Alkaline Phosphatase: 108 U/L (ref 33–130)
BUN: 13 mg/dL (ref 7–25)
CALCIUM: 9 mg/dL (ref 8.6–10.4)
CO2: 27 mmol/L (ref 20–31)
Chloride: 108 mmol/L (ref 98–110)
Creat: 0.77 mg/dL (ref 0.50–0.99)
GLUCOSE: 83 mg/dL (ref 65–99)
POTASSIUM: 4.2 mmol/L (ref 3.5–5.3)
Sodium: 143 mmol/L (ref 135–146)
Total Bilirubin: 0.5 mg/dL (ref 0.2–1.2)
Total Protein: 6.3 g/dL (ref 6.1–8.1)

## 2015-07-15 LAB — LIPID PANEL
CHOL/HDL RATIO: 2.1 ratio (ref <=5.0)
Cholesterol: 165 mg/dL (ref 125–200)
HDL: 80 mg/dL (ref >=46)
LDL CALC: 66 mg/dL (ref <130)
Triglycerides: 94 mg/dL (ref <150)
VLDL: 19 mg/dL (ref <30)

## 2015-07-15 LAB — POCT INR: INR: 2.4

## 2015-07-23 ENCOUNTER — Other Ambulatory Visit: Payer: Self-pay

## 2015-07-23 ENCOUNTER — Telehealth: Payer: Self-pay | Admitting: Internal Medicine

## 2015-07-23 ENCOUNTER — Other Ambulatory Visit: Payer: Self-pay | Admitting: *Deleted

## 2015-07-23 ENCOUNTER — Ambulatory Visit: Payer: Commercial Managed Care - PPO | Admitting: Pharmacist Clinician (PhC)/ Clinical Pharmacy Specialist

## 2015-07-23 DIAGNOSIS — I714 Abdominal aortic aneurysm, without rupture, unspecified: Secondary | ICD-10-CM

## 2015-07-23 DIAGNOSIS — I771 Stricture of artery: Secondary | ICD-10-CM

## 2015-07-23 DIAGNOSIS — Z1231 Encounter for screening mammogram for malignant neoplasm of breast: Secondary | ICD-10-CM

## 2015-07-23 NOTE — Telephone Encounter (Signed)
Returning your call. °

## 2015-07-23 NOTE — Telephone Encounter (Signed)
Nya called and spoke with patient and provided results.

## 2015-07-26 ENCOUNTER — Telehealth: Payer: Self-pay | Admitting: Internal Medicine

## 2015-07-26 NOTE — Telephone Encounter (Signed)
Patient called a dentist at [facility] and she was told she needed a note from the cardiologist. She is going to get another opinion on Thursday from a dentist in [place]. She will call back with this dentist's plan of care or have them send the info to our office.

## 2015-07-26 NOTE — Telephone Encounter (Signed)
Pt is returning Jenna's call   Thanks

## 2015-07-26 NOTE — Telephone Encounter (Signed)
Agree ... Thanks [employer].  Dr. Rexene Edison

## 2015-07-26 NOTE — Telephone Encounter (Signed)
Pt needs to have a tooth extracted,she needs a note stating this is all right per Dr Rennis Golden.

## 2015-07-26 NOTE — Telephone Encounter (Signed)
Would not recommend holding warfarin for single dental extraction.  We could check INR day prior and hold a dose if INR > 3.

## 2015-07-26 NOTE — Telephone Encounter (Signed)
LM for patient to call back to find out what dentist this needs to go to and get a phone and/or fax  Message routed to MD & PharmD - on warfarin

## 2015-07-29 ENCOUNTER — Telehealth: Payer: Self-pay | Admitting: Internal Medicine

## 2015-07-29 NOTE — Telephone Encounter (Signed)
[name] is calling to let you know that she found a dentist and they will fax over something so that she can her tooth pulled . Please call if you have any questions.   Thanks

## 2015-07-29 NOTE — Telephone Encounter (Signed)
Noted - Will await fax.

## 2015-07-29 NOTE — Telephone Encounter (Signed)
1. Type of surgery: Dental Treatment - cleaning (simple or deep), X-Rays, Fillings/Crowns/Bridges, Extractions (simple or surgical), local anesthetic with epinephrine 2. Date of surgery: not specified 3. Dr. Octaviano Batty, DDS 4. Fax and/or Phone: (p) [phone]  (f) [phone] 5. Info Needed: patient has had AVR & aortic root replacement   - please evaluate the patient's medical history & advise [place] of any special considerations that should be made   - Antibiotic for premedication? Which med?    - Interruption of anticoagulants? If yes, how long?    - Anesthetic restrictions? Is Epinephrine OK?    - Any specified pain medication allowed/recommended?

## 2015-07-30 NOTE — Telephone Encounter (Signed)
No contraindication to anethesthetics. Antibiotics are recommended given the prosthetic valve for endocarditis prophylaxis - Usually 2G amoxicillin prior to the procedure. Probably don't have to stop warfarin for most routine dental procedures.   Dr. Rexene Edison

## 2015-07-30 NOTE — Telephone Encounter (Signed)
Faxed note to Octaviano Batty, DDS

## 2015-08-02 ENCOUNTER — Telehealth: Payer: Self-pay | Admitting: Internal Medicine

## 2015-08-02 NOTE — Telephone Encounter (Signed)
Pt called in wanting to speak with Eileen Stanford , Dr. Blanchie Dessert nurse because she has found a dentist , Dr. Blinda Leatherwood. She says that his off needs to be contacted so they are in instructed on what the pt needs to do with her Coumadin. She says that she needs a tooth extracted as soon as possible.Please f/u with her and the doctor's office. The number to the office is [phone]  Thanks

## 2015-08-02 NOTE — Telephone Encounter (Signed)
LM for patient that fax was sent to dentist office last week.  Contacted dentist office and confirmed receipt of fax.

## 2015-08-06 ENCOUNTER — Ambulatory Visit (INDEPENDENT_AMBULATORY_CARE_PROVIDER_SITE_OTHER): Payer: Commercial Managed Care - PPO | Admitting: Pharmacist Clinician (PhC)/ Clinical Pharmacy Specialist

## 2015-08-06 ENCOUNTER — Ambulatory Visit
Admission: RE | Admit: 2015-08-06 | Discharge: 2015-08-06 | Disposition: A | Discharge status: Home / Self Care | Payer: Commercial Managed Care - PPO | Source: Ambulatory Visit

## 2015-08-06 DIAGNOSIS — Z7901 Long term (current) use of anticoagulants: Secondary | ICD-10-CM | POA: Diagnosis not present

## 2015-08-06 DIAGNOSIS — Z954 Presence of other heart-valve replacement: Secondary | ICD-10-CM | POA: Diagnosis not present

## 2015-08-06 DIAGNOSIS — Z1231 Encounter for screening mammogram for malignant neoplasm of breast: Secondary | ICD-10-CM

## 2015-08-06 DIAGNOSIS — Z952 Presence of prosthetic heart valve: Secondary | ICD-10-CM

## 2015-08-06 LAB — POCT INR: INR: 2.7

## 2015-09-03 ENCOUNTER — Ambulatory Visit: Payer: Commercial Managed Care - PPO | Admitting: Pharmacist Clinician (PhC)/ Clinical Pharmacy Specialist

## 2015-09-10 ENCOUNTER — Ambulatory Visit: Payer: Commercial Managed Care - PPO | Admitting: Pharmacist Clinician (PhC)/ Clinical Pharmacy Specialist

## 2015-09-17 ENCOUNTER — Ambulatory Visit (INDEPENDENT_AMBULATORY_CARE_PROVIDER_SITE_OTHER): Payer: Commercial Managed Care - PPO | Admitting: Pharmacist Clinician (PhC)/ Clinical Pharmacy Specialist

## 2015-09-17 DIAGNOSIS — Z7901 Long term (current) use of anticoagulants: Secondary | ICD-10-CM | POA: Diagnosis not present

## 2015-09-17 DIAGNOSIS — Z952 Presence of prosthetic heart valve: Secondary | ICD-10-CM

## 2015-09-17 DIAGNOSIS — Z954 Presence of other heart-valve replacement: Secondary | ICD-10-CM | POA: Diagnosis not present

## 2015-09-17 LAB — POCT INR: INR: 2.9

## 2015-10-15 ENCOUNTER — Ambulatory Visit (INDEPENDENT_AMBULATORY_CARE_PROVIDER_SITE_OTHER): Payer: Commercial Managed Care - PPO | Admitting: Pharmacist Clinician (PhC)/ Clinical Pharmacy Specialist

## 2015-10-15 DIAGNOSIS — Z7901 Long term (current) use of anticoagulants: Secondary | ICD-10-CM

## 2015-10-15 DIAGNOSIS — Z954 Presence of other heart-valve replacement: Secondary | ICD-10-CM

## 2015-10-15 DIAGNOSIS — Z952 Presence of prosthetic heart valve: Secondary | ICD-10-CM

## 2015-10-15 LAB — POCT INR: INR: 3.4

## 2015-10-26 ENCOUNTER — Encounter: Payer: Self-pay | Admitting: Pharmacist Clinician (PhC)/ Clinical Pharmacy Specialist

## 2015-11-24 ENCOUNTER — Other Ambulatory Visit: Payer: Self-pay | Admitting: Internal Medicine

## 2015-11-26 ENCOUNTER — Encounter: Payer: Commercial Managed Care - PPO | Admitting: Pharmacist Clinician (PhC)/ Clinical Pharmacy Specialist

## 2015-11-29 ENCOUNTER — Ambulatory Visit (INDEPENDENT_AMBULATORY_CARE_PROVIDER_SITE_OTHER): Payer: Commercial Managed Care - PPO | Admitting: Pharmacist Clinician (PhC)/ Clinical Pharmacy Specialist

## 2015-11-29 DIAGNOSIS — Z954 Presence of other heart-valve replacement: Secondary | ICD-10-CM

## 2015-11-29 DIAGNOSIS — Z952 Presence of prosthetic heart valve: Secondary | ICD-10-CM

## 2015-11-29 DIAGNOSIS — Z7901 Long term (current) use of anticoagulants: Secondary | ICD-10-CM

## 2015-11-29 LAB — POCT INR: INR: 3.5

## 2016-01-07 ENCOUNTER — Ambulatory Visit: Payer: Commercial Managed Care - PPO | Admitting: Internal Medicine

## 2016-01-07 ENCOUNTER — Encounter: Payer: Commercial Managed Care - PPO | Admitting: Pharmacist Clinician (PhC)/ Clinical Pharmacy Specialist

## 2016-01-21 ENCOUNTER — Ambulatory Visit (INDEPENDENT_AMBULATORY_CARE_PROVIDER_SITE_OTHER): Payer: Commercial Managed Care - PPO | Admitting: Pharmacist Clinician (PhC)/ Clinical Pharmacy Specialist

## 2016-01-21 ENCOUNTER — Ambulatory Visit (INDEPENDENT_AMBULATORY_CARE_PROVIDER_SITE_OTHER): Payer: Commercial Managed Care - PPO | Admitting: Internal Medicine

## 2016-01-21 ENCOUNTER — Encounter: Payer: Self-pay | Admitting: Internal Medicine

## 2016-01-21 VITALS — BP 148/74 | HR 74 | Ht 66.0 in | Wt 172.0 lb

## 2016-01-21 DIAGNOSIS — Z954 Presence of other heart-valve replacement: Secondary | ICD-10-CM | POA: Diagnosis not present

## 2016-01-21 DIAGNOSIS — I739 Peripheral vascular disease, unspecified: Secondary | ICD-10-CM

## 2016-01-21 DIAGNOSIS — Z7901 Long term (current) use of anticoagulants: Secondary | ICD-10-CM | POA: Diagnosis not present

## 2016-01-21 DIAGNOSIS — I2510 Atherosclerotic heart disease of native coronary artery without angina pectoris: Secondary | ICD-10-CM | POA: Diagnosis not present

## 2016-01-21 DIAGNOSIS — Z952 Presence of prosthetic heart valve: Secondary | ICD-10-CM

## 2016-01-21 DIAGNOSIS — I77810 Thoracic aortic ectasia: Secondary | ICD-10-CM | POA: Diagnosis not present

## 2016-01-21 DIAGNOSIS — I2583 Coronary atherosclerosis due to lipid rich plaque: Secondary | ICD-10-CM

## 2016-01-21 LAB — POCT INR: INR: 2.6

## 2016-01-21 MED ORDER — METOPROLOL TARTRATE 25 MG PO TABS: 12.5000 mg | ORAL_TABLET | Freq: Two times a day (BID) | ORAL | Status: DC

## 2016-01-21 MED ORDER — PRAVASTATIN SODIUM 80 MG PO TABS: 80.0000 mg | ORAL_TABLET | Freq: Every day | ORAL | Status: DC

## 2016-01-21 NOTE — Patient Instructions (Signed)
Your physician has recommended you make the following change in your medication...  1. Dr. Rennis Golden has refilled your pravastatin  2. Dr. Rennis Golden would like you to START METOPROLOL TARTRATE 12.5mg  twice daily  You are due for the following test(s) in 6 months: -- ECHO -- Abdominal Aorta Doppler -- Carotid Doppler  Your physician wants you to follow-up in: 6 months with Dr. Rennis Golden after your testing.  You will receive a reminder letter in the mail two months in advance. If you don't receive a letter, please call our office to schedule the follow-up appointment.

## 2016-01-21 NOTE — Progress Notes (Signed)
OFFICE NOTE  Chief Complaint:  Follow-up  Primary Care Physician: [name], MD  HPI:  [name] is a very pleasant 65 year old African American female with a history of St. Jude AVR placed in 2000 for aortic insufficiency. 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 She also has a history of sarcoidosis, dyslipidemia and contrast allergy, which apparently gave her hives. In April 2013, she saw Dr. Clarene Duke with complaints of chest pain, for which he then did a Myoview on her, which was normal. She was last seen by Corine Shelter on October 21, 2012. At that time she was complaining of having chest pressure and shortness of breath. She was set up for a diagnostic heart catheterization. This was completed by Dr. Rennis Golden, which revealed severe ostial left main stenosis greater than 95%. There was also suggestion of possible ostial RCA disease. The ascending aorta was dilated up to 4.8 cm. At that time, Dr. Donata Clay was asked to consult. Patient was then taken for bypass surgery x2 with a saphenous vein graft to the left anterior descending coronary artery and a saphenous vein graft to the circumflex marginal. 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 Also during that hospitalization, she had development of a pericardial hematoma. Dr. Donata Clay then did a subxiphoid pericardial exposure and evacuation of the pericardial hematoma. She was seen in follow-up in March 2014 and was doing well.  She had lost about 17 lbs during her hospitalization.  She reports returning to work this past summer and is still able to do most activities. Her INR has been followed in our Coumadin clinic and is actually supratherapeutic today at 4. Her dose will be adjusted. She's not had any further laboratory work or followup since this past Spring.  She denies any chest pain with exertion, but does report some discomfort associated with her anterior  scars as well as her sternal wires. She does occasionally get fatigued with work but this improves with rest.  I saw [name] back today in follow-up. She is doing fairly well. She denies any shortness of breath or chest pain. She is annoyed somewhat at her mechanical valve which she says is very loud. She underwent a CT scan this year in March which shows a stable thoracic aneurysm repair at 4.0 cm. 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 In the past she's had left subclavian bypass and will need ultrasound follow-up of that. She's also not had any lab work in the past several months.  [name] returns today for follow-up. Overall she seems to be doing pretty well. She says she gets hot and noted that her blood pressure is somewhat elevated from time to time now. It seems as if she's not currently on any blood pressure medicines. Last time I saw her she was not taking lisinopril and her blood pressure was fairly normal therefore I kept her off the medicine. Previous to that she was taken off of her beta blocker. She really has indication for either one of these are both based on her history of coronary disease, coronary artery bypass grafting, valve repair, and aneurysm. Blood pressure is elevated today. She is due for an INR check. Dopplers including AAA screen as well as ultrasound of her subclavian stenosis were performed in October last year and are stable.  PMHx:  Past Medical History  Diagnosis Date  .  Coronary artery disease     mech AVR, CABG  . Anginal pain (HCC)   . Shortness of breath   . S/P AVR (aortic valve replacement) 08/1999    St. Jude AVR for aortic insuff  . Subclavian artery stenosis (HCC) 06/2000    left subclavian bypass  . 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 Donata Clay) - complicate by pericardial hematoma --> subxiphoid pericardial  exposure and evacuation  . Hypertension   . Hyperlipidemia   . RA (rheumatoid arthritis) (HCC)   . Sarcoid [facility])     Past Surgical History  Procedure Laterality Date  . Coronary artery bypass graft  11/05/2012    Procedure: CORONARY ARTERY BYPASS GRAFTING (CABG);  Surgeon: Kerin Perna, MD;  Location: [facility] OR;  Service: Open Heart Surgery;  Laterality: N/A;  cannulate right subclavian  . Replacement ascending aorta  11/05/2012    Procedure: REPLACEMENT ASCENDING AORTA;  Surgeon: Kerin Perna, MD;  Location: [facility] OR;  Service: Open Heart Surgery;  Laterality: N/A;  . Intraoperative transesophageal echocardiogram  11/05/2012    Procedure: INTRAOPERATIVE TRANSESOPHAGEAL ECHOCARDIOGRAM;  Surgeon: Kerin Perna, MD;  Location: [facility] OR;  Service: Open Heart Surgery;  Laterality: N/A;  . Endovein harvest of greater saphenous vein  11/05/2012    Procedure: ENDOVEIN HARVEST OF GREATER SAPHENOUS VEIN;  Surgeon: Kerin Perna, MD;  Location: [facility] OR;  Service: Open Heart Surgery;  Laterality: Right;  . Pericardial window N/A 11/21/2012    Procedure: PERICARDIAL WINDOW;  Surgeon: Kerin Perna, MD;  Location: [facility] OR;  Service: Thoracic;  Laterality: N/A;  . Intraoperative transesophageal echocardiogram N/A 11/21/2012    Procedure: INTRAOPERATIVE TRANSESOPHAGEAL ECHOCARDIOGRAM;  Surgeon: Kerin Perna, MD;  Location: [facility] OR;  Service: Open Heart Surgery;  Laterality: N/A;  . Subclavian bypass graft  05/16/1999    left subclavian bypass with 17mm Darcon graft (Dr. Amada Kingfisher)  . Cardiac catheterization  08/1999    normal L main, LAD & first diagonal are normal, normal Cfx, RCA with mild ostial narrowing, normal LV systolic function, severe AI, mild MR, normal PAP (Dr. Mervyn Skeeters. [place])   . Aortic valve replacement  08/23/1999    63mm St. Jude, Bentall procedure, root replacemtn   . Subclavian angiogram  05/10/2000    left subclavian arteriogram; patent graft, mild stenosis (Dr. Amada Kingfisher)   . Mediastinoscopy  08/2005     bronchoscopy & mediastinoscopy for mediastinal adenopathy (Dr. Donata Clay)   . Nm myocar perf wall motion  01/2012    lexiscan; small, fixed apical septal breast attenuation artifact & inferolateral bowel attenuation, no reversible ischemia, post-stress EF 64%, low risk scan   . Transthoracic echocardiogram  11/20/2012    EF 65-70%, mild conc LVH, grade 2 diastolic dysfunction, mech AV, mild MR  . Left heart catheterization with coronary angiogram N/A 11/04/2012    Procedure: LEFT HEART CATHETERIZATION WITH CORONARY ANGIOGRAM;  Surgeon: Chrystie Nose, MD;  Location: [facility] CATH LAB;  Service: Cardiovascular;  Laterality: N/A;    FAMHx:  Family History  Problem Relation Age of Onset  . Hypertension Father     SOCHx:   reports that she has never smoked. She has never used smokeless tobacco. She reports that she does not drink alcohol. Her drug history is not on file.  ALLERGIES:  Allergies  Allergen Reactions  . Contrast Media [Iodinated Diagnostic Agents] Hives, Itching and  Swelling    ROS: Pertinent items noted in HPI and remainder of comprehensive ROS otherwise negative.  HOME MEDS: Current Outpatient Prescriptions  Medication Sig Dispense Refill  . Acetaminophen (TYLENOL PO) Take by mouth as needed.    . leflunomide (ARAVA) 20 MG tablet Take 20 mg by mouth daily.  2  . omeprazole (PRILOSEC) 20 MG capsule Take 1 capsule (20 mg total) by mouth daily. 30 capsule 11  . pravastatin (PRAVACHOL) 80 MG tablet Take 1 tablet (80 mg total) by mouth daily. 30 tablet 9  . warfarin (COUMADIN) 5 MG tablet TAKE 1 TABLET BY MOUTH EVERY DAY OR AS DIRECTED 90 tablet 1  . metoprolol tartrate (LOPRESSOR) 25 MG tablet Take 0.5 tablets (12.5 mg total) by mouth 2 (two) times daily. 90 tablet 3   No current facility-administered medications for this visit.    LABS/IMAGING: No results found for this or any previous visit (from the past 48 hour(s)). No results found.  VITALS: BP 148/74 mmHg  Pulse 74   Ht 5\' 6"  (1.676 m)  Wt 172 lb (78.019 kg)  BMI 27.77 kg/m2  EXAM: General appearance: alert and no distress Neck: no carotid bruit and no JVD Lungs: clear to auscultation bilaterally Heart: regular rate and rhythm and sharp mechanical valve sounds Abdomen: soft, non-tender; bowel sounds normal; no masses,  no organomegaly Extremities: extremities normal, atraumatic, no cyanosis or edema Pulses: 2+ and symmetric Skin: Skin color, texture, turgor normal. No rashes or lesions Neurologic: Grossly normal Psych: Pleasant, normal  EKG: Sinus rhythm with sinus arrhythmia and PVCs at 74  ASSESSMENT: 1. Coronary artery disease status post two-vessel CABG in 11/2012 (LIMA to LAD, SVG to circumflex). 2. 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 Status post pericardial window with hematoma evacuation 4. History of left subclavian stenosis status post bypass in 2001 5. Rheumatoid arthritis 6. Sarcoidosis 7. Dyslipidemia 8. Hypertension  PLAN: 1.   [name] seems to be fairly stable at this point. Unfortunately she's off of both lisinopril and metoprolol. Blood pressure is noted to be elevated today. She's also having some PVCs. I like to restart her metoprolol 12.5 mg twice a day. We'll hold off on lisinopril this point to see if her blood pressure can tolerate it. She does have a follow-up with Belenda Cruise for an INR check probably in one month if it stable. At that time she can be reevaluated see if the lisinopril could be added to her regimen. We'll plan to recheck an echocardiogram in 6 months for reevaluation of her prosthetic valve which was last looked at 2015. She also has repeat Dopplers in October as well. Follow-up with me at that time.  Chrystie Nose, MD, [facility] Attending Cardiologist [facility]  Chrystie Nose 01/21/2016, 3:13 PM

## 2016-02-16 ENCOUNTER — Other Ambulatory Visit: Payer: Self-pay | Admitting: Internal Medicine

## 2016-02-16 MED ORDER — WARFARIN SODIUM 5 MG PO TABS: ORAL_TABLET | ORAL | Status: DC

## 2016-03-03 ENCOUNTER — Other Ambulatory Visit: Payer: Self-pay | Admitting: Pharmacist Clinician (PhC)/ Clinical Pharmacy Specialist

## 2016-03-03 ENCOUNTER — Ambulatory Visit (INDEPENDENT_AMBULATORY_CARE_PROVIDER_SITE_OTHER): Payer: Commercial Managed Care - PPO | Admitting: Pharmacist Clinician (PhC)/ Clinical Pharmacy Specialist

## 2016-03-03 DIAGNOSIS — Z954 Presence of other heart-valve replacement: Secondary | ICD-10-CM | POA: Diagnosis not present

## 2016-03-03 DIAGNOSIS — Z952 Presence of prosthetic heart valve: Secondary | ICD-10-CM

## 2016-03-03 DIAGNOSIS — Z7901 Long term (current) use of anticoagulants: Secondary | ICD-10-CM | POA: Diagnosis not present

## 2016-03-03 LAB — POCT INR: INR: 3.8

## 2016-03-03 MED ORDER — LISINOPRIL 5 MG PO TABS: 5.0000 mg | ORAL_TABLET | Freq: Every day | ORAL | Status: DC

## 2016-04-14 ENCOUNTER — Ambulatory Visit (INDEPENDENT_AMBULATORY_CARE_PROVIDER_SITE_OTHER): Payer: Commercial Managed Care - PPO | Admitting: Pharmacist Clinician (PhC)/ Clinical Pharmacy Specialist

## 2016-04-14 DIAGNOSIS — Z952 Presence of prosthetic heart valve: Secondary | ICD-10-CM

## 2016-04-14 DIAGNOSIS — Z954 Presence of other heart-valve replacement: Secondary | ICD-10-CM | POA: Diagnosis not present

## 2016-04-14 DIAGNOSIS — Z7901 Long term (current) use of anticoagulants: Secondary | ICD-10-CM | POA: Diagnosis not present

## 2016-04-14 LAB — POCT INR: INR: 4

## 2016-05-05 ENCOUNTER — Ambulatory Visit (INDEPENDENT_AMBULATORY_CARE_PROVIDER_SITE_OTHER): Payer: Commercial Managed Care - PPO | Admitting: Pharmacist

## 2016-05-05 DIAGNOSIS — Z7901 Long term (current) use of anticoagulants: Secondary | ICD-10-CM | POA: Diagnosis not present

## 2016-05-05 DIAGNOSIS — Z952 Presence of prosthetic heart valve: Secondary | ICD-10-CM

## 2016-05-05 DIAGNOSIS — Z954 Presence of other heart-valve replacement: Secondary | ICD-10-CM | POA: Diagnosis not present

## 2016-05-05 LAB — POCT INR: INR: 3.4

## 2016-05-20 ENCOUNTER — Other Ambulatory Visit: Payer: Self-pay | Admitting: Internal Medicine

## 2016-06-02 ENCOUNTER — Ambulatory Visit (INDEPENDENT_AMBULATORY_CARE_PROVIDER_SITE_OTHER): Payer: Commercial Managed Care - PPO | Admitting: Pharmacist

## 2016-06-02 DIAGNOSIS — Z7901 Long term (current) use of anticoagulants: Secondary | ICD-10-CM | POA: Diagnosis not present

## 2016-06-02 DIAGNOSIS — Z954 Presence of other heart-valve replacement: Secondary | ICD-10-CM | POA: Diagnosis not present

## 2016-06-02 DIAGNOSIS — Z952 Presence of prosthetic heart valve: Secondary | ICD-10-CM

## 2016-06-02 LAB — POCT INR: INR: 3

## 2016-06-13 ENCOUNTER — Ambulatory Visit (HOSPITAL_COMMUNITY)
Admission: RE | Admit: 2016-06-13 | Discharge: 2016-06-13 | Disposition: A | Discharge status: Home / Self Care | Payer: Commercial Managed Care - PPO | Source: Ambulatory Visit | Attending: Cardiovascular Disease | Admitting: Cardiovascular Disease

## 2016-06-13 DIAGNOSIS — I708 Atherosclerosis of other arteries: Secondary | ICD-10-CM | POA: Diagnosis not present

## 2016-06-13 DIAGNOSIS — I2510 Atherosclerotic heart disease of native coronary artery without angina pectoris: Secondary | ICD-10-CM | POA: Insufficient documentation

## 2016-06-13 DIAGNOSIS — I10 Essential (primary) hypertension: Secondary | ICD-10-CM | POA: Insufficient documentation

## 2016-06-13 DIAGNOSIS — I739 Peripheral vascular disease, unspecified: Secondary | ICD-10-CM | POA: Insufficient documentation

## 2016-06-13 DIAGNOSIS — I771 Stricture of artery: Secondary | ICD-10-CM

## 2016-06-13 DIAGNOSIS — I7410 Embolism and thrombosis of unspecified parts of aorta: Secondary | ICD-10-CM | POA: Diagnosis not present

## 2016-06-13 DIAGNOSIS — I714 Abdominal aortic aneurysm, without rupture, unspecified: Secondary | ICD-10-CM

## 2016-06-13 DIAGNOSIS — I700 Atherosclerosis of aorta: Secondary | ICD-10-CM | POA: Insufficient documentation

## 2016-06-13 DIAGNOSIS — I6523 Occlusion and stenosis of bilateral carotid arteries: Secondary | ICD-10-CM | POA: Diagnosis not present

## 2016-06-18 ENCOUNTER — Other Ambulatory Visit: Payer: Self-pay | Admitting: Internal Medicine

## 2016-06-19 ENCOUNTER — Emergency Department (HOSPITAL_COMMUNITY): Payer: Commercial Managed Care - PPO

## 2016-06-19 ENCOUNTER — Encounter (HOSPITAL_COMMUNITY): Payer: Self-pay | Admitting: *Deleted

## 2016-06-19 DIAGNOSIS — Z91041 Radiographic dye allergy status: Secondary | ICD-10-CM

## 2016-06-19 DIAGNOSIS — Z7901 Long term (current) use of anticoagulants: Secondary | ICD-10-CM

## 2016-06-19 DIAGNOSIS — I25118 Atherosclerotic heart disease of native coronary artery with other forms of angina pectoris: Secondary | ICD-10-CM | POA: Diagnosis present

## 2016-06-19 DIAGNOSIS — I10 Essential (primary) hypertension: Secondary | ICD-10-CM | POA: Diagnosis present

## 2016-06-19 DIAGNOSIS — Z951 Presence of aortocoronary bypass graft: Secondary | ICD-10-CM

## 2016-06-19 DIAGNOSIS — I214 Non-ST elevation (NSTEMI) myocardial infarction: Secondary | ICD-10-CM | POA: Diagnosis not present

## 2016-06-19 DIAGNOSIS — Z952 Presence of prosthetic heart valve: Secondary | ICD-10-CM

## 2016-06-19 DIAGNOSIS — E785 Hyperlipidemia, unspecified: Secondary | ICD-10-CM | POA: Diagnosis present

## 2016-06-19 DIAGNOSIS — I2584 Coronary atherosclerosis due to calcified coronary lesion: Secondary | ICD-10-CM | POA: Diagnosis present

## 2016-06-19 DIAGNOSIS — M069 Rheumatoid arthritis, unspecified: Secondary | ICD-10-CM | POA: Diagnosis present

## 2016-06-19 DIAGNOSIS — I25718 Atherosclerosis of autologous vein coronary artery bypass graft(s) with other forms of angina pectoris: Secondary | ICD-10-CM | POA: Diagnosis not present

## 2016-06-19 DIAGNOSIS — Z8249 Family history of ischemic heart disease and other diseases of the circulatory system: Secondary | ICD-10-CM

## 2016-06-19 DIAGNOSIS — I2582 Chronic total occlusion of coronary artery: Secondary | ICD-10-CM | POA: Diagnosis present

## 2016-06-19 DIAGNOSIS — I714 Abdominal aortic aneurysm, without rupture: Secondary | ICD-10-CM | POA: Diagnosis present

## 2016-06-19 NOTE — ED Triage Notes (Signed)
Pt reports that she has been having on and off chest pain for about 2 weeks, associated with vomiting, feels like her heart is racing at times and feeling dizzy.

## 2016-06-20 ENCOUNTER — Encounter (HOSPITAL_COMMUNITY): Payer: Self-pay

## 2016-06-20 ENCOUNTER — Inpatient Hospital Stay (HOSPITAL_COMMUNITY)
Admission: EM | Admit: 2016-06-20 | Discharge: 2016-06-25 | DRG: 246 | Disposition: A | Discharge status: Home / Self Care | Payer: Commercial Managed Care - PPO | Attending: Cardiology | Admitting: Cardiology

## 2016-06-20 DIAGNOSIS — Z91041 Radiographic dye allergy status: Secondary | ICD-10-CM | POA: Diagnosis present

## 2016-06-20 DIAGNOSIS — I2582 Chronic total occlusion of coronary artery: Secondary | ICD-10-CM | POA: Diagnosis present

## 2016-06-20 DIAGNOSIS — I25810 Atherosclerosis of coronary artery bypass graft(s) without angina pectoris: Secondary | ICD-10-CM | POA: Diagnosis not present

## 2016-06-20 DIAGNOSIS — M79609 Pain in unspecified limb: Secondary | ICD-10-CM | POA: Diagnosis not present

## 2016-06-20 DIAGNOSIS — Z9889 Other specified postprocedural states: Secondary | ICD-10-CM | POA: Diagnosis not present

## 2016-06-20 DIAGNOSIS — I25718 Atherosclerosis of autologous vein coronary artery bypass graft(s) with other forms of angina pectoris: Secondary | ICD-10-CM | POA: Diagnosis present

## 2016-06-20 DIAGNOSIS — Z951 Presence of aortocoronary bypass graft: Secondary | ICD-10-CM

## 2016-06-20 DIAGNOSIS — M069 Rheumatoid arthritis, unspecified: Secondary | ICD-10-CM | POA: Diagnosis present

## 2016-06-20 DIAGNOSIS — Z952 Presence of prosthetic heart valve: Secondary | ICD-10-CM | POA: Diagnosis not present

## 2016-06-20 DIAGNOSIS — Z954 Presence of other heart-valve replacement: Secondary | ICD-10-CM

## 2016-06-20 DIAGNOSIS — I214 Non-ST elevation (NSTEMI) myocardial infarction: Secondary | ICD-10-CM

## 2016-06-20 DIAGNOSIS — I2584 Coronary atherosclerosis due to calcified coronary lesion: Secondary | ICD-10-CM | POA: Diagnosis present

## 2016-06-20 DIAGNOSIS — I2510 Atherosclerotic heart disease of native coronary artery without angina pectoris: Secondary | ICD-10-CM | POA: Diagnosis not present

## 2016-06-20 DIAGNOSIS — Z8249 Family history of ischemic heart disease and other diseases of the circulatory system: Secondary | ICD-10-CM | POA: Diagnosis not present

## 2016-06-20 DIAGNOSIS — I10 Essential (primary) hypertension: Secondary | ICD-10-CM | POA: Diagnosis present

## 2016-06-20 DIAGNOSIS — Z7901 Long term (current) use of anticoagulants: Secondary | ICD-10-CM

## 2016-06-20 DIAGNOSIS — E785 Hyperlipidemia, unspecified: Secondary | ICD-10-CM | POA: Diagnosis present

## 2016-06-20 DIAGNOSIS — I714 Abdominal aortic aneurysm, without rupture: Secondary | ICD-10-CM | POA: Diagnosis present

## 2016-06-20 DIAGNOSIS — I25118 Atherosclerotic heart disease of native coronary artery with other forms of angina pectoris: Secondary | ICD-10-CM | POA: Diagnosis present

## 2016-06-20 LAB — CBC
HEMATOCRIT: 39.8 % (ref 36.0–46.0)
HEMOGLOBIN: 12.6 g/dL (ref 12.0–15.0)
MCH: 24.8 pg — ABNORMAL LOW (ref 26.0–34.0)
MCHC: 31.7 g/dL (ref 30.0–36.0)
MCV: 78.2 fL (ref 78.0–100.0)
Platelets: 212 10*3/uL (ref 150–400)
RBC: 5.09 MIL/uL (ref 3.87–5.11)
RDW: 16.8 % — ABNORMAL HIGH (ref 11.5–15.5)
WBC: 7.7 10*3/uL (ref 4.0–10.5)

## 2016-06-20 LAB — BASIC METABOLIC PANEL
ANION GAP: 8 (ref 5–15)
Anion gap: 9 (ref 5–15)
BUN: 12 mg/dL (ref 6–20)
BUN: 12 mg/dL (ref 6–20)
CO2: 25 mmol/L (ref 22–32)
CO2: 28 mmol/L (ref 22–32)
Calcium: 9.9 mg/dL (ref 8.9–10.3)
Calcium: 9.8 mg/dL (ref 8.9–10.3)
Chloride: 109 mmol/L (ref 101–111)
Chloride: 109 mmol/L (ref 101–111)
Creatinine, Ser: 1.06 mg/dL — ABNORMAL HIGH (ref 0.44–1.00)
Creatinine, Ser: 0.98 mg/dL (ref 0.44–1.00)
GFR calc Af Amer: >60 mL/min (ref >60)
GFR, EST NON AFRICAN AMERICAN: 55 mL/min — AB (ref >60)
GLUCOSE: 95 mg/dL (ref 65–99)
Glucose, Bld: 114 mg/dL — ABNORMAL HIGH (ref 65–99)
POTASSIUM: 3.8 mmol/L (ref 3.5–5.1)
POTASSIUM: 3.8 mmol/L (ref 3.5–5.1)
SODIUM: 143 mmol/L (ref 135–145)
SODIUM: 145 mmol/L (ref 135–145)

## 2016-06-20 LAB — LIPID PANEL
CHOL/HDL RATIO: 2.2 ratio
CHOLESTEROL: 142 mg/dL (ref 0–200)
HDL: 65 mg/dL (ref >40)
LDL Cholesterol: 65 mg/dL (ref 0–99)
TRIGLYCERIDES: 62 mg/dL (ref <150)
VLDL: 12 mg/dL (ref 0–40)

## 2016-06-20 LAB — TROPONIN I
TROPONIN I: 0.21 ng/mL — AB (ref <0.03)
Troponin I: 0.33 ng/mL (ref <0.03)
Troponin I: 0.39 ng/mL (ref <0.03)
Troponin I: 0.33 ng/mL (ref <0.03)

## 2016-06-20 LAB — PROTIME-INR
INR: 2.32
INR: 2.4
INR: 2.53
PROTHROMBIN TIME: 26.6 s — AB (ref 11.4–15.2)
Prothrombin Time: 25.9 seconds — ABNORMAL HIGH (ref 11.4–15.2)
Prothrombin Time: 27.7 seconds — ABNORMAL HIGH (ref 11.4–15.2)

## 2016-06-20 LAB — TSH: TSH: 2.676 u[IU]/mL (ref 0.350–4.500)

## 2016-06-20 LAB — MRSA PCR SCREENING: MRSA by PCR: NEGATIVE

## 2016-06-20 LAB — I-STAT TROPONIN, ED: Troponin i, poc: 0.26 ng/mL (ref 0.00–0.08)

## 2016-06-20 MED ORDER — SODIUM CHLORIDE 0.9% FLUSH
3.0000 mL | Freq: Two times a day (BID) | INTRAVENOUS | Status: DC
  Administered 2016-06-20 – 2016-06-22 (×3): 3 mL via INTRAVENOUS

## 2016-06-20 MED ORDER — NITROGLYCERIN 0.4 MG SL SUBL: 0.4000 mg | SUBLINGUAL_TABLET | SUBLINGUAL | Status: DC | PRN

## 2016-06-20 MED ORDER — SODIUM CHLORIDE 0.9% FLUSH: 3.0000 mL | INTRAVENOUS | Status: DC | PRN

## 2016-06-20 MED ORDER — METOPROLOL TARTRATE 5 MG/5ML IV SOLN
5.0000 mg | Freq: Once | INTRAVENOUS | Status: AC
  Administered 2016-06-20: 5 mg via INTRAVENOUS
  Filled 2016-06-20: qty 5

## 2016-06-20 MED ORDER — NITROGLYCERIN IN D5W 200-5 MCG/ML-% IV SOLN
0.0000 ug/min | Freq: Once | INTRAVENOUS | Status: AC
  Administered 2016-06-20: 5 ug/min via INTRAVENOUS
  Filled 2016-06-20: qty 250

## 2016-06-20 MED ORDER — SODIUM CHLORIDE 0.9 % WEIGHT BASED INFUSION: 1.0000 mL/kg/h | INTRAVENOUS | Status: DC

## 2016-06-20 MED ORDER — FAMOTIDINE IN NACL 20-0.9 MG/50ML-% IV SOLN
20.0000 mg | INTRAVENOUS | Status: AC
  Administered 2016-06-20: 20 mg via INTRAVENOUS
  Filled 2016-06-20: qty 50

## 2016-06-20 MED ORDER — ATORVASTATIN CALCIUM 80 MG PO TABS
80.0000 mg | ORAL_TABLET | Freq: Every day | ORAL | Status: DC
  Administered 2016-06-20 – 2016-06-24 (×5): 80 mg via ORAL
  Filled 2016-06-20 (×5): qty 1

## 2016-06-20 MED ORDER — METHYLPREDNISOLONE SODIUM SUCC 125 MG IJ SOLR: 125.0000 mg | INTRAMUSCULAR | Status: DC

## 2016-06-20 MED ORDER — ASPIRIN 81 MG PO CHEW
81.0000 mg | CHEWABLE_TABLET | ORAL | Status: AC
  Administered 2016-06-21: 81 mg via ORAL
  Filled 2016-06-20: qty 1

## 2016-06-20 MED ORDER — PANTOPRAZOLE SODIUM 40 MG PO TBEC
40.0000 mg | DELAYED_RELEASE_TABLET | Freq: Every day | ORAL | Status: DC
  Administered 2016-06-20 – 2016-06-22 (×3): 40 mg via ORAL
  Filled 2016-06-20 (×3): qty 1

## 2016-06-20 MED ORDER — ASPIRIN 81 MG PO CHEW: 81.0000 mg | CHEWABLE_TABLET | ORAL | Status: DC

## 2016-06-20 MED ORDER — PREDNISONE 20 MG PO TABS
60.0000 mg | ORAL_TABLET | ORAL | Status: AC
  Administered 2016-06-20: 60 mg via ORAL
  Filled 2016-06-20: qty 3

## 2016-06-20 MED ORDER — ASPIRIN 81 MG PO CHEW
324.0000 mg | CHEWABLE_TABLET | Freq: Once | ORAL | Status: AC
  Administered 2016-06-20: 324 mg via ORAL
  Filled 2016-06-20: qty 4

## 2016-06-20 MED ORDER — ONDANSETRON HCL 4 MG/2ML IJ SOLN: 4.0000 mg | Freq: Four times a day (QID) | INTRAMUSCULAR | Status: DC | PRN

## 2016-06-20 MED ORDER — ASPIRIN 300 MG RE SUPP: 300.0000 mg | RECTAL | Status: AC

## 2016-06-20 MED ORDER — SODIUM CHLORIDE 0.9% FLUSH
3.0000 mL | Freq: Two times a day (BID) | INTRAVENOUS | Status: DC
  Administered 2016-06-20: 3 mL via INTRAVENOUS

## 2016-06-20 MED ORDER — ADULT MULTIVITAMIN W/MINERALS CH
1.0000 | ORAL_TABLET | Freq: Every day | ORAL | Status: DC
  Administered 2016-06-20 – 2016-06-22 (×3): 1 via ORAL
  Filled 2016-06-20 (×3): qty 1

## 2016-06-20 MED ORDER — METOPROLOL TARTRATE 25 MG PO TABS
25.0000 mg | ORAL_TABLET | Freq: Four times a day (QID) | ORAL | Status: DC
  Administered 2016-06-20 (×2): 25 mg via ORAL
  Filled 2016-06-20 (×3): qty 1

## 2016-06-20 MED ORDER — SODIUM CHLORIDE 0.9% FLUSH
3.0000 mL | Freq: Two times a day (BID) | INTRAVENOUS | Status: DC
  Administered 2016-06-20 – 2016-06-21 (×3): 3 mL via INTRAVENOUS

## 2016-06-20 MED ORDER — SODIUM CHLORIDE 0.9 % WEIGHT BASED INFUSION: 3.0000 mL/kg/h | INTRAVENOUS | Status: DC

## 2016-06-20 MED ORDER — PREDNISONE 50 MG PO TABS: 60.0000 mg | ORAL_TABLET | ORAL | Status: DC

## 2016-06-20 MED ORDER — DIPHENHYDRAMINE HCL 50 MG/ML IJ SOLN
25.0000 mg | INTRAMUSCULAR | Status: AC
  Administered 2016-06-20: 25 mg via INTRAVENOUS
  Filled 2016-06-20: qty 1

## 2016-06-20 MED ORDER — DIPHENHYDRAMINE HCL 50 MG/ML IJ SOLN: 25.0000 mg | INTRAMUSCULAR | Status: DC

## 2016-06-20 MED ORDER — SODIUM CHLORIDE 0.9 % IV SOLN: 250.0000 mL | INTRAVENOUS | Status: DC | PRN

## 2016-06-20 MED ORDER — SODIUM CHLORIDE 0.9 % WEIGHT BASED INFUSION
3.0000 mL/kg/h | INTRAVENOUS | Status: DC
  Administered 2016-06-21: 3 mL/kg/h via INTRAVENOUS

## 2016-06-20 MED ORDER — ASPIRIN EC 81 MG PO TBEC
81.0000 mg | DELAYED_RELEASE_TABLET | Freq: Every day | ORAL | Status: DC
  Administered 2016-06-22 – 2016-06-25 (×4): 81 mg via ORAL
  Filled 2016-06-20 (×4): qty 1

## 2016-06-20 MED ORDER — ACETAMINOPHEN 325 MG PO TABS
650.0000 mg | ORAL_TABLET | ORAL | Status: DC | PRN
  Administered 2016-06-21 – 2016-06-24 (×2): 650 mg via ORAL
  Filled 2016-06-20 (×2): qty 2

## 2016-06-20 MED ORDER — ASPIRIN 81 MG PO CHEW
324.0000 mg | CHEWABLE_TABLET | ORAL | Status: AC
  Filled 2016-06-20: qty 4

## 2016-06-20 MED ORDER — SODIUM CHLORIDE 0.9 % IV SOLN
250.0000 mL | INTRAVENOUS | Status: DC | PRN
  Administered 2016-06-20: 250 mL via INTRAVENOUS

## 2016-06-20 MED ORDER — FAMOTIDINE IN NACL 20-0.9 MG/50ML-% IV SOLN: 20.0000 mg | INTRAVENOUS | Status: DC

## 2016-06-20 NOTE — ED Notes (Signed)
Attempted report x1. 

## 2016-06-20 NOTE — ED Provider Notes (Addendum)
By signing my name below, I, [name]. Elon Spanner, attest that this documentation has been prepared under the direction and in the presence of Mariah Soledad N Claudean Leavelle, DO.  Electronically Signed: Suzan [name]. Elon Spanner, ED Scribe. 06/20/16. 12:31 AM.   TIME SEEN: 12:47 AM   CHIEF COMPLAINT:  Chief Complaint  Patient presents with  . Chest Pain     HPI: HPI Comments: [name] is a 65 y.o. female with a PMHx of HTN, subclavian artery stenosis, CABG x 2V in 2014, AVR and HLD who presents to the Emergency Department complaining of constant, unchanged substernal chest pain x 2 weeks. Pain is described as heaviness and  Burning. Chest pain is exacerbated with exertion and improved at rest. She also reports associated nausea and mild shortness of breath. Has had associated vomiting, dizziness, diaphoresis. No prior family history of heart disease. PSHx includes aortic valve replacement.  Has a history of 4 cm aneurysmal dilatation of the distal ascending aorta and arch, last scan on March 2016. Reports anaphylactic reaction to IV contrast. States that this feels similar to her prior anginal pain when she had to have a CABG but feels worse today.  PCP: Mariah Clan, MD  CARDIOLOGIST: Mariah Racer, MD  ROS: See HPI Constitutional: no fever  Eyes: no drainage  ENT: no runny nose   Cardiovascular:  Positive chest pain  Resp: no SOB  GI: no vomiting GU: no dysuria Integumentary: no rash  Allergy: no hives  Musculoskeletal: no leg swelling  Neurological: no slurred speech ROS otherwise negative  PAST MEDICAL HISTORY/PAST SURGICAL HISTORY:  Past Medical History:  Diagnosis Date  . Anginal pain (HCC)   . Coronary artery disease    mech AVR, CABG  . Hyperlipidemia   . Hypertension   . RA (rheumatoid arthritis) (HCC)   . S/P AVR (aortic valve replacement) 08/1999   St. Jude AVR for aortic insuff  . S/P CABG x 2 11/2012   SVG to LAD, SVG to Cfx; re-do replacement of fusiform aneursym of ascending  thoracic aorta (Dr. Donata Clay) - complicate by pericardial hematoma --> subxiphoid pericardial exposure and evacuation  . Sarcoid (HCC)   . Shortness of breath   . Subclavian artery stenosis (HCC) 06/2000   left subclavian bypass    MEDICATIONS:  Prior to Admission medications   Medication Sig Start Date End Date Taking? Authorizing Provider  Acetaminophen (TYLENOL PO) Take by mouth as needed.    Historical Provider, MD  leflunomide (ARAVA) 20 MG tablet Take 20 mg by mouth daily. 05/18/15   Historical Provider, MD  lisinopril (PRINIVIL,ZESTRIL) 5 MG tablet Take 1 tablet (5 mg total) by mouth daily. 03/03/16   Chrystie Nose, MD  metoprolol tartrate (LOPRESSOR) 25 MG tablet Take 0.5 tablets (12.5 mg total) by mouth 2 (two) times daily. 01/21/16   Chrystie Nose, MD  omeprazole (PRILOSEC) 20 MG capsule TAKE 1 CAPSULE (20 MG TOTAL) BY MOUTH DAILY. 05/22/16   Chrystie Nose, MD  pravastatin (PRAVACHOL) 80 MG tablet Take 1 tablet (80 mg total) by mouth daily. 01/21/16   Chrystie Nose, MD  warfarin (COUMADIN) 5 MG tablet TAKE 1 TO 1 AND 1/2 TABLET BY MOUTH EVERY DAY AS DIRECTED BY COUMADIN CLINIC 06/19/16   Chrystie Nose, MD    ALLERGIES:  Allergies  Allergen Reactions  . Contrast Media [Iodinated Diagnostic Agents] Hives, Itching and Swelling    SOCIAL HISTORY:  Social History  Substance Use Topics  . Smoking status: Never Smoker  .  Smokeless tobacco: Never Used  . Alcohol use No    FAMILY HISTORY: Family History  Problem Relation Age of Onset  . Hypertension Father     EXAM: BP 161/58   Pulse 70   Temp 98.9 F (37.2 C) (Oral)   Resp 18   Ht 5\' 6"  (1.676 m)   Wt 169 lb (76.7 kg)   SpO2 100%   BMI 27.28 kg/m  CONSTITUTIONAL: Alert and oriented and responds appropriately to questions. Well-appearing; well-nourished HEAD: Normocephalic EYES: Conjunctivae clear, PERRL ENT: normal nose; no rhinorrhea; moist mucous membranes NECK: Supple, no meningismus, no LAD  CARD: RRR;  S1 and S2 appreciated; no murmurs, no clicks, no rubs, no gallops RESP: Normal chest excursion without splinting or tachypnea; breath sounds clear and equal bilaterally; no wheezes, no rhonchi, no rales, no hypoxia or respiratory distress, speaking full sentences ABD/GI: Normal bowel sounds; non-distended; soft, non-tender, no rebound, no guarding, no peritoneal signs BACK:  The back appears normal and is non-tender to palpation, there is no CVA tenderness EXT: Normal ROM in all joints; non-tender to palpation; no edema; normal capillary refill; no cyanosis, no calf tenderness or swelling, equal pulses in all 4 extremities    SKIN: Normal color for age and race; warm; no rash NEURO: Moves all extremities equally, sensation to light touch intact diffusely, cranial nerves II through XII intact PSYCH: The patient's mood and manner are appropriate. Grooming and personal hygiene are appropriate.  MEDICAL DECISION MAKING: Patient with complaints of chest pain.  Has significant cardiac history. Patient has a positive troponin. We'll give aspirin, nitroglycerin, heparin. She states that this is similar to her anginal pain. Doubt dissection, pulmonary embolus. No back pain, abdominal pain. Equal pulses all extremity. We'll discuss with cardiology on call.  ED PROGRESS: 1:10 AM  D/w Dr. Shirlee Latch who is on-call for cardiology. He agrees with plan for heparin, nitroglycerin drip. He will admit patient. Patient and family updated with plan.   I reviewed all nursing notes, vitals, pertinent old records, EKGs, labs, imaging (as available).     EKG Interpretation  Date/Time:  Monday June 19 2016 23:02:12 EDT Ventricular Rate:  70 PR Interval:  132 QRS Duration: 110 QT Interval:  402 QTC Calculation: 434 R Axis:   65 Text Interpretation:  Sinus rhythm with Premature atrial complexes Left ventricular hypertrophy with repolarization abnormality Abnormal ECG Confirmed by Moe Brier,  DO, Adaisha Campise [phone]) on  06/20/2016 12:28:31 AM        EKG Interpretation  Date/Time:  Tuesday June 20 2016 00:47:33 EDT Ventricular Rate:  79 PR Interval:  132 QRS Duration: 115 QT Interval:  422 QTC Calculation: 484 R Axis:   77 Text Interpretation:  Sinus rhythm LVH with IVCD and secondary repol abnrm No significant change since last tracing Confirmed by Orra Nolde,  DO, Treanna Dumler ([id number]) on 06/20/2016 1:19:14 AM         CRITICAL CARE Performed by: Raelyn Number   Total critical care time: 35 minutes  Critical care time was exclusive of separately billable procedures and treating other patients.  Critical care was necessary to treat or prevent imminent or life-threatening deterioration.  Critical care was time spent personally by me on the following activities: development of treatment plan with patient and/or surrogate as well as nursing, discussions with consultants, evaluation of patient's response to treatment, examination of patient, obtaining history from patient or surrogate, ordering and performing treatments and interventions, ordering and review of laboratory studies, ordering and review of radiographic studies, pulse  oximetry and re-evaluation of patient's condition.   I personally performed the services described in this documentation, which was scribed in my presence. The recorded information has been reviewed and is accurate.    Layla Maw Laurence Folz, DO 06/20/16 0118    Layla Maw Pricilla Moehle, DO 06/20/16 6720

## 2016-06-20 NOTE — Progress Notes (Signed)
ANTICOAGULATION CONSULT NOTE - Follow-Up Consult  Pharmacy Consult for Heparin Indication: chest pain/ACS  Allergies  Allergen Reactions  . Contrast Media [Iodinated Diagnostic Agents] Hives, Itching and Swelling    Patient Measurements: Height: 5\' 7"  (170.2 cm) Weight: 171 lb 1.2 oz (77.6 kg) IBW/kg (Calculated) : 61.6  Vital Signs: Temp: 98.1 F (36.7 C) (09/19 1119) Temp Source: Oral (09/19 1119) BP: 133/57 (09/19 1119) Pulse Rate: 63 (09/19 1119)  Labs:  Recent Labs  06/19/16 2309 06/20/16 0030 06/20/16 0052 06/20/16 0419 06/20/16 0732 06/20/16 1403  HGB 12.6  --   --   --   --   --   HCT 39.8  --   --   --   --   --   PLT 212  --   --   --   --   --   LABPROT  --   --  25.9* 27.7*  --  26.6*  INR  --   --  2.32 2.53  --  2.40  CREATININE 1.06*  --   --  0.98  --   --   TROPONINI  --  0.33*  --  0.39* 0.33*  --     Estimated Creatinine Clearance: 63.1 mL/min (by C-G formula based on SCr of 0.98 mg/dL).   Medical History: Past Medical History:  Diagnosis Date  . Anginal pain (HCC)   . Coronary artery disease    mech AVR, CABG  . Hyperlipidemia   . Hypertension   . RA (rheumatoid arthritis) (HCC)   . S/P AVR (aortic valve replacement) 08/1999   St. Jude AVR for aortic insuff  . 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 11/2012) - complicate by pericardial hematoma --> subxiphoid pericardial exposure and evacuation  . Sarcoid (HCC)   . Shortness of breath   . 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    Current Outpatient Prescriptions on File Prior to Encounter  Medication Sig Dispense Refill  . metoprolol tartrate (LOPRESSOR) 25 MG tablet Take 0.5 tablets (12.5 mg total) by mouth 2 (two) times daily. 90 tablet 3  . omeprazole (PRILOSEC) 20 MG capsule TAKE 1  CAPSULE (20 MG TOTAL) BY MOUTH DAILY. 30 capsule 3  . pravastatin (PRAVACHOL) 80 MG tablet Take 1 tablet (80 mg total) by mouth daily. 30 tablet 9  . warfarin (COUMADIN) 5 MG tablet TAKE 1 TO 1 AND 1/2 TABLET BY MOUTH EVERY DAY AS DIRECTED BY COUMADIN CLINIC (Patient taking differently: Take 0.5(2.5mg ) tablet on Monday and Thursday, all other days 1(5mg )  tablet) 105 tablet 1    Assessment: 65 y.o. female presents with worsening angina found to have elevated troponins. Pt has PMH of mechanical AVR on PTA Coumadin. Coumadin currently on hold with plan for heparin once INR < 2 and possible cardiac cath once INR < 1.8. On recheck INR still elevated at 2.40 - continue to hold heparin for now.  Goal of Therapy:  Heparin level 0.3-0.7 units/ml Monitor platelets by anticoagulation protocol: Yes   Plan:  -F/U daily INR -Start heparin once INR < 2.0  65, PharmD PGY-1 Pharmacy Resident Pager: [phone]. 06/20/2016

## 2016-06-20 NOTE — Interval H&P Note (Signed)
History and Physical Interval Note:  06/20/2016 9:15 PM ACS in patient with SVG LAD and SVG to Cfx 2014. Had AVR 2000 with mechanical valve. Had Bentall followed by Ascending aortic replacement at time of CABG. Also had LCC to left subclavian bypass. INR 2.4 and pending in AM prior to study. Will proceed from right radial.    Cath Lab Visit (complete for each Cath Lab visit)  Clinical Evaluation Leading to the Procedure:   ACS: Yes.    Non-ACS:    Anginal Classification: CCS [name]  Anti-ischemic medical therapy: Minimal Therapy (1 class of medications)  Non-Invasive Test Results: No non-invasive testing performed  Prior CABG: Previous CABG       [name]  has presented today for surgery, with the diagnosis of n stemi  The various methods of treatment have been discussed with the patient and family. After consideration of risks, benefits and other options for treatment, the patient has consented to  Procedure(s): Left Heart Cath and Cors/Grafts Angiography (N/A) as a surgical intervention .  The patient's history has been reviewed, patient examined, no change in status, stable for surgery.  I have reviewed the patient's chart and labs.  Questions were answered to the patient's satisfaction.     [name]

## 2016-06-20 NOTE — H&P (Addendum)
Physician History and Physical    [name] MRN: [id number] DOB/AGE: 64-Nov-1952 64 y.o. Admit date: 06/20/2016  Referring: Dr Elesa Massed (ER) Primary Care Physician: Dr Clelia Croft Primary Cardiologist: Dr Rennis Golden  HPI: 65 yo with history of CAD s/p CABG, mechanical AVR, ascending aorta replacement, left subclavian bypass presents with chest pain and elevated troponin concerning for NSTEMI.  She had been doing pretty well until about 2 weeks ago.  Since that time, she has had substernal chest aching with walking up stairs, carrying a load, or vacuuming at her job.  Pain has resolved with rest prior to today.  Pain feels like ischemic pain prior to CABG.  Today, she was walking out from her job and developed substernal chest aching. She tried to go to [employer] but pain continued.  She had to call her son to get her.  Son brought her to ER.  Pain down to 5/10 on low dose NTG gtt.  Initial ECG showed < 1 mm inferior and anterolateral ST depression that appeared new.  Troponin was mildly elevated.  INR 2.32, on warfarin for mechanical valve.  Of note, she has a contrast allergy.   PMH: 1. Rheumatoid arthritis 2. Sarcoidosis 3. CAD: s/p CABG in 2014 with SVG-LAD, SVG-LCx.  4. Aortic root and ascending aortic aneurysm: Had [place] with AVR in 2000, then had re-do ascending aorta replacement with CABG in 2014. 5. Mechanical AVR for aortic insufficiency in 2000. - Echo (1/15) with EF 55-60%, mechanical aortic valve appeared normal. 6. Left CCA => left subclavian bypass in 2001 7. Contrast allergy 8. HTN 9. Hyperlipidemia 10. AAA: 3.6 cm in 9/17.   Review of systems complete and found to be negative unless listed above   Family History  Problem Relation Age of Onset  . Hypertension Father     Social History   Social History  . Marital status: Single    Spouse name: N/A  . Number of children: 2  . Years of education: 12   Occupational History  .  [employer] [place]   Social History Main Topics   . Smoking status: Never Smoker  . Smokeless tobacco: Never Used  . Alcohol use No  . Drug use: Unknown  . Sexual activity: Yes    Birth control/ protection: Post-menopausal   Other Topics Concern  . Not on file   Social History Narrative  . No narrative on file    Current Facility-Administered Medications  Medication Dose Route Frequency Provider Last Rate Last Dose  . nitroGLYCERIN (NITROSTAT) SL tablet 0.4 mg  0.4 mg Sublingual Q5 min PRN Layla Maw Ward, DO       Current Outpatient Prescriptions  Medication Sig Dispense Refill  . metoprolol tartrate (LOPRESSOR) 25 MG tablet Take 0.5 tablets (12.5 mg total) by mouth 2 (two) times daily. 90 tablet 3  . Multiple Vitamin (MULTIVITAMIN WITH MINERALS) TABS tablet Take 1 tablet by mouth daily.    Marland Kitchen omeprazole (PRILOSEC) 20 MG capsule TAKE 1 CAPSULE (20 MG TOTAL) BY MOUTH DAILY. 30 capsule 3  . polyvinyl alcohol (ARTIFICIAL TEARS) 1.4 % ophthalmic solution Place 1-2 drops into both eyes daily as needed for dry eyes.    . pravastatin (PRAVACHOL) 80 MG tablet Take 1 tablet (80 mg total) by mouth daily. 30 tablet 9  . warfarin (COUMADIN) 5 MG tablet TAKE 1 TO 1 AND 1/2 TABLET BY MOUTH EVERY DAY AS DIRECTED BY COUMADIN CLINIC (Patient taking differently: Take 0.5(2.5mg ) tablet on Monday and Thursday, all other days  1(5mg )  tablet) 105 tablet 1    Physical Exam: Blood pressure 180/72, pulse 69, temperature 98.9 F (37.2 C), temperature source Oral, resp. rate 17, height 5\' 6"  (1.676 m), weight 169 lb (76.7 kg), SpO2 100 %.  General: NAD Neck: No JVD, no thyromegaly or thyroid nodule.  Lungs: Clear to auscultation bilaterally with normal respiratory effort. CV: Nondisplaced PMI.  Heart regular S1/S2 with mechanical S2, no S3/S4, 2/6 early SEM RUSB.  No peripheral edema.  No carotid bruit.  Normal pedal pulses.  Abdomen: Soft, nontender, no hepatosplenomegaly, no distention.  Skin: Intact without lesions or rashes.  Neurologic: Alert and  oriented x 3.  Psych: Normal affect. Extremities: No clubbing or cyanosis.  HEENT: Normal.   Labs:   Lab Results  Component Value Date   WBC 7.7 06/19/2016   HGB 12.6 06/19/2016   HCT 39.8 06/19/2016   MCV 78.2 06/19/2016   PLT 212 06/19/2016    Recent Labs Lab 06/19/16 2309  NA 143  K 3.8  CL 109  CO2 25  BUN 12  CREATININE 1.06*  CALCIUM 9.9  GLUCOSE 114*  TnI 0.26  Radiology:  - CXR: Elevated left hemidiaphragm  EKG: NSR, LVH, < 1 mm ST depression inferior and anterolateral leads (looks new compared to prior).   ASSESSMENT AND PLAN: 65 yo with history of CAD s/p CABG, mechanical AVR, ascending aorta replacement, left subclavian bypass presents with chest pain and elevated troponin concerning for NSTEMI.  1. CAD: s/p CABG 2014.  Now with progressive anginal x 2 weeks culminating in suspected NSTEMI this evening. She has ECG changes and troponin 0.26 initially.  Still with 5/10 chest pain but improved.  She is only on low dose NTG gtt. BP is high. - Titrate up NTG gtt as needed for pain control. - Metoprolol 5 mg IV x 1 then 25 mg every 6 hrs po.  - Atorvastatin 80 - Will add ACEI if BP/creatinine remain stable.  - Start heparin gtt when INR </= 2, holding warfarin.  - She will need cardiac cath.  If pain can be controlled, would wait until INR < 1.8.  Will recheck INR later this morning.  If pain difficult to control, may have to do cath sooner (will need reassessment by cardiology team later this morning).  She did not take today's warfarin.  We discussed risks/benefits of cath and she agrees to proceed.  She will need premedication for contrast allergy.  2. St Jude mechanical aortic valve: Crisp valve click.  Holding warfarin for cath, will cover with heparin gtt when INR </= 2.  Will get echo to reassess valve and assess EF.  3. 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 4. Ascending aortic aneurysm replacement 5. AAA:  stable on 9/17 10/17.  6. HTN: Control with NTG gtt, metoprolol for now.  Add ACEI prior to discharge.   Signed: [place] 06/20/2016, 1:27 AM

## 2016-06-20 NOTE — Progress Notes (Signed)
Subjective: No further pain, and her racing heart has not returned.  Objective: Vital signs in last 24 hours: Temp:  [97.7 F (36.5 C)-98.9 F (37.2 C)] 98.1 F (36.7 C) (09/19 1119) Pulse Rate:  [54-70] 63 (09/19 1119) Resp:  [14-23] 15 (09/19 1119) BP: (113-180)/(53-96) 133/57 (09/19 1119) SpO2:  [96 %-100 %] 98 % (09/19 1119) Weight:  [169 lb (76.7 kg)-171 lb 1.2 oz (77.6 kg)] 171 lb 1.2 oz (77.6 kg) (09/19 0831) Weight change:    Intake/Output from previous day: 09/18 0701 - 09/19 0700 In: 7.3 [I.V.:7.3] Out: -  Intake/Output this shift: Total I/O In: 83.3 [I.V.:33.3; IV Piggyback:50] Out: -   PE: General:Pleasant affect, NAD Skin:Warm and dry, brisk capillary refill HEENT:normocephalic, sclera clear, mucus membranes moist Neck:supple, no JVD Heart:S1S2 RRR with soft systolic murmur, no gallup, rub or click Lungs:clear without rales, rhonchi, or wheezes ERD:EYCX, non tender, + BS, do not palpate liver spleen or masses Ext:no lower ext edema, 2+ pedal pulses, 2+ radial pulses Neuro:alert and oriented X 3, MAE, follows commands, + facial symmetry Tele:  SR with PACs.     Lab Results:  Recent Labs  06/19/16 2309  WBC 7.7  HGB 12.6  HCT 39.8  PLT 212   BMET  Recent Labs  06/19/16 2309 06/20/16 0419  NA 143 145  K 3.8 3.8  CL 109 109  CO2 25 28  GLUCOSE 114* 95  BUN 12 12  CREATININE 1.06* 0.98  CALCIUM 9.9 9.8    Recent Labs  06/20/16 0732 06/20/16 1355  TROPONINI 0.33* 0.21*    Lab Results  Component Value Date   CHOL 142 06/20/2016   HDL 65 06/20/2016   LDLCALC 65 06/20/2016   TRIG 62 06/20/2016   CHOLHDL 2.2 06/20/2016   Lab Results  Component Value Date   HGBA1C 6.1 (H) 11/04/2012     Lab Results  Component Value Date   TSH 2.676 06/20/2016    Hepatic Function Panel No results for input(s): PROT, ALBUMIN, AST, ALT, ALKPHOS, BILITOT, BILIDIR, IBILI in the last 72 hours.  Recent Labs  06/20/16 0419  CHOL 142     No results for input(s): PROTIME in the last 72 hours.     Studies/Results: Dg Chest 2 View  Result Date: 06/19/2016 CLINICAL DATA:  Initial evaluation for acute mid chest pain for 2 weeks. EXAM: CHEST  2 VIEW COMPARISON:  Prior radiograph from 01/15/2013. FINDINGS: Median sternotomy wires underlying CABG markers and surgical clips noted, stable. Cardiomegaly unchanged. Mediastinal silhouette stable. Atheromatous plaque present within the aortic arch. Elevation of the left hemidiaphragm with associated left basilar atelectasis. No other focal airspace disease. No pulmonary edema or pleural effusion. No pneumothorax. No acute osseous abnormality. Surgical clips overlie the left lung apex and right axillary region. IMPRESSION: 1. Elevation left hemidiaphragm with associated mild left basilar atelectasis. 2. No other active cardiopulmonary disease. 3. Aortic atherosclerosis. Electronically Signed   By: Rise Mu M.D.   On: 06/19/2016 23:26    Medications: I have reviewed the patient's current medications. Scheduled Meds: . aspirin  324 mg Oral NOW   Or  . aspirin  300 mg Rectal NOW  . [START ON 06/21/2016] aspirin  81 mg Oral Pre-Cath  . [START ON 06/21/2016] aspirin EC  81 mg Oral Daily  . atorvastatin  80 mg Oral q1800  . [START ON 06/21/2016] diphenhydrAMINE  25 mg Intravenous Pre-Cath  . [START ON 06/21/2016] famotidine (PEPCID) IV  20 mg Intravenous Pre-Cath  . [START ON 06/21/2016] methylPREDNISolone (SOLU-MEDROL) injection  125 mg Intravenous Pre-Cath  . metoprolol tartrate  25 mg Oral Q6H  . multivitamin with minerals  1 tablet Oral Daily  . pantoprazole  40 mg Oral Daily  . sodium chloride flush  3 mL Intravenous Q12H  . sodium chloride flush  3 mL Intravenous Q12H  . sodium chloride flush  3 mL Intravenous Q12H   Continuous Infusions: . [START ON 06/21/2016] sodium chloride     Followed by  . [START ON 06/21/2016] sodium chloride     PRN Meds:.sodium chloride, sodium  chloride, sodium chloride, acetaminophen, nitroGLYCERIN, nitroGLYCERIN, ondansetron (ZOFRAN) IV, sodium chloride flush, sodium chloride flush, sodium chloride flush  Assessment/Plan: Principal Problem:   NSTEMI (non-ST elevated myocardial infarction) (HCC) Active Problems:   CAD-severe LM disease- S/P CABG X 2 (SVG-OM, SVG-LAD) 11/05/12 (hx of Lt SCA stenosis)   S/P AVR, St Jude 2000   Chronic anticoagulation for mechanical AVR   Contrast media allergy   HTN (hypertension)  troponins 0.26 to pk of 0.33  Plan for cath in AM if INR decreased- to be checked at 0500.  Last dose of coumadin 06/18/16.  She had also had rapid HR with activity.  None here.   The patient understands that risks included but are not limited to stroke (1 in 1000), death (1 in 1000), kidney failure [usually temporary] (1 in 500), bleeding (1 in 200), allergic reaction [possibly serious] (1 in 200).  Pt agreeable to proceed.  All questions answered.    BP is controlled.    LOS: 0 days   Time spent with pt. :15 minutes. Nada Boozer  Nurse Practitioner Certified Pager [phone] or after 5pm and on weekends call [phone] 06/20/2016, 3:55 PM  Agree with assessment and plan. INR elevated 2.40; not yet ready for cath.  No further pain. Will re-check INR in am    Lennette Bihari, MD, [facility] 06/21/2016 11:52 AM

## 2016-06-20 NOTE — Progress Notes (Signed)
ANTICOAGULATION CONSULT NOTE - Initial Consult  Pharmacy Consult for Heparin Indication: chest pain/ACS  Allergies  Allergen Reactions  . Contrast Media [Iodinated Diagnostic Agents] Hives, Itching and Swelling    Patient Measurements: Height: 5\' 6"  (167.6 cm) Weight: 169 lb (76.7 kg) IBW/kg (Calculated) : 59.3  Vital Signs: Temp: 98.9 F (37.2 C) (09/18 2306) Temp Source: Oral (09/18 2306) BP: 141/64 (09/19 0430) Pulse Rate: 55 (09/19 0430)  Labs:  Recent Labs  06/19/16 2309 06/20/16 0030 06/20/16 0052  HGB 12.6  --   --   HCT 39.8  --   --   PLT 212  --   --   LABPROT  --   --  25.9*  INR  --   --  2.32  CREATININE 1.06*  --   --   TROPONINI  --  0.33*  --     Estimated Creatinine Clearance: 56.9 mL/min (by C-G formula based on SCr of 1.06 mg/dL (H)).   Medical History: Past Medical History:  Diagnosis Date  . Anginal pain (HCC)   . Coronary artery disease    mech AVR, CABG  . Hyperlipidemia   . Hypertension   . RA (rheumatoid arthritis) (HCC)   . S/P AVR (aortic valve replacement) 08/1999   St. Jude AVR for aortic insuff  . 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 11/2012) - complicate by pericardial hematoma --> subxiphoid pericardial exposure and evacuation  . Sarcoid (HCC)   . Shortness of breath   . 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    Current Outpatient Prescriptions on File Prior to Encounter  Medication Sig Dispense Refill  . metoprolol tartrate (LOPRESSOR) 25 MG tablet Take 0.5 tablets (12.5 mg total) by mouth 2 (two) times daily. 90 tablet 3  . omeprazole (PRILOSEC) 20 MG capsule TAKE 1 CAPSULE (20 MG TOTAL) BY MOUTH DAILY. 30 capsule 3  . pravastatin (PRAVACHOL) 80 MG tablet Take 1 tablet (80 mg total) by mouth daily. 30 tablet 9  .  warfarin (COUMADIN) 5 MG tablet TAKE 1 TO 1 AND 1/2 TABLET BY MOUTH EVERY DAY AS DIRECTED BY COUMADIN CLINIC (Patient taking differently: Take 0.5(2.5mg ) tablet on Monday and Thursday, all other days 1(5mg )  tablet) 105 tablet 1    Assessment: 65 y.o. female with chest pain, h/o mechanical AVR and Coumadin on hold, for heparin once INR < 2  Goal of Therapy:  Heparin level 0.3-0.7 units/ml Monitor platelets by anticoagulation protocol: Yes   Plan:  F/U daily INR for now.  65 06/20/2016,4:38 AM

## 2016-06-21 ENCOUNTER — Inpatient Hospital Stay (HOSPITAL_COMMUNITY): Payer: Commercial Managed Care - PPO

## 2016-06-21 DIAGNOSIS — I2510 Atherosclerotic heart disease of native coronary artery without angina pectoris: Secondary | ICD-10-CM

## 2016-06-21 LAB — CBC
HCT: 38.5 % (ref 36.0–46.0)
Hemoglobin: 12.3 g/dL (ref 12.0–15.0)
MCH: 25 pg — AB (ref 26.0–34.0)
MCHC: 31.9 g/dL (ref 30.0–36.0)
MCV: 78.3 fL (ref 78.0–100.0)
PLATELETS: 211 10*3/uL (ref 150–400)
RBC: 4.92 MIL/uL (ref 3.87–5.11)
RDW: 16.6 % — ABNORMAL HIGH (ref 11.5–15.5)
WBC: 11.8 10*3/uL — ABNORMAL HIGH (ref 4.0–10.5)

## 2016-06-21 LAB — BASIC METABOLIC PANEL
Anion gap: 4 — ABNORMAL LOW (ref 5–15)
BUN: 14 mg/dL (ref 6–20)
CALCIUM: 9.7 mg/dL (ref 8.9–10.3)
CO2: 28 mmol/L (ref 22–32)
CREATININE: 0.97 mg/dL (ref 0.44–1.00)
Chloride: 110 mmol/L (ref 101–111)
GFR calc Af Amer: >60 mL/min (ref >60)
GFR calc non Af Amer: >60 mL/min (ref >60)
GLUCOSE: 113 mg/dL — AB (ref 65–99)
Potassium: 4.3 mmol/L (ref 3.5–5.1)
Sodium: 142 mmol/L (ref 135–145)

## 2016-06-21 LAB — ECHOCARDIOGRAM COMPLETE
Height: 67 in
WEIGHTICAEL: 2737.23 [oz_av]

## 2016-06-21 LAB — PROTIME-INR
INR: 2.56
PROTHROMBIN TIME: 28 s — AB (ref 11.4–15.2)

## 2016-06-21 MED ORDER — METOPROLOL TARTRATE 25 MG PO TABS
25.0000 mg | ORAL_TABLET | Freq: Two times a day (BID) | ORAL | Status: DC
  Administered 2016-06-21 – 2016-06-25 (×5): 25 mg via ORAL
  Filled 2016-06-21 (×5): qty 1
  Filled 2016-06-21: qty 2
  Filled 2016-06-21: qty 1

## 2016-06-21 MED ORDER — SODIUM CHLORIDE 0.9 % WEIGHT BASED INFUSION
3.0000 mL/kg/h | INTRAVENOUS | Status: DC
  Administered 2016-06-22: 3 mL/kg/h via INTRAVENOUS

## 2016-06-21 MED ORDER — METOPROLOL TARTRATE 50 MG PO TABS
50.0000 mg | ORAL_TABLET | Freq: Two times a day (BID) | ORAL | Status: DC
  Filled 2016-06-21: qty 1

## 2016-06-21 MED ORDER — SODIUM CHLORIDE 0.9 % WEIGHT BASED INFUSION: 1.0000 mL/kg/h | INTRAVENOUS | Status: DC

## 2016-06-21 NOTE — Progress Notes (Signed)
ANTICOAGULATION CONSULT NOTE - Follow-Up Consult  Pharmacy Consult for Heparin Indication: chest pain/ACS  Allergies  Allergen Reactions  . Contrast Media [Iodinated Diagnostic Agents] Hives, Itching and Swelling    Patient Measurements: Height: 5\' 7"  (170.2 cm) Weight: 171 lb 1.2 oz (77.6 kg) IBW/kg (Calculated) : 61.6  Vital Signs: Temp: 98 F (36.7 C) (09/20 1210) Temp Source: Oral (09/20 0740) BP: 131/85 (09/20 1210) Pulse Rate: 57 (09/20 1210)  Labs:  Recent Labs  06/19/16 2309  06/20/16 0419 06/20/16 0732 06/20/16 1355 06/20/16 1403 06/21/16 0253  HGB 12.6  --   --   --   --   --  12.3  HCT 39.8  --   --   --   --   --  38.5  PLT 212  --   --   --   --   --  211  LABPROT  --   < > 27.7*  --   --  26.6* 28.0*  INR  --   < > 2.53  --   --  2.40 2.56  CREATININE 1.06*  --  0.98  --   --   --  0.97  TROPONINI  --   < > 0.39* 0.33* 0.21*  --   --   < > = values in this interval not displayed.  Estimated Creatinine Clearance: 63.7 mL/min (by C-G formula based on SCr of 0.97 mg/dL).   Medical History: Past Medical History:  Diagnosis Date  . Anginal pain (HCC)   . Coronary artery disease    mech AVR, CABG  . Hyperlipidemia   . Hypertension   . RA (rheumatoid arthritis) (HCC)   . S/P AVR (aortic valve replacement) 08/1999   St. Jude AVR for aortic insuff  . 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 11/2012) - complicate by pericardial hematoma --> subxiphoid pericardial exposure and evacuation  . Sarcoid (HCC)   . Shortness of breath   . 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    Current Outpatient Prescriptions on File Prior to Encounter  Medication Sig Dispense Refill  . metoprolol tartrate (LOPRESSOR) 25 MG tablet Take 0.5 tablets (12.5 mg total) by mouth 2  (two) times daily. 90 tablet 3  . omeprazole (PRILOSEC) 20 MG capsule TAKE 1 CAPSULE (20 MG TOTAL) BY MOUTH DAILY. 30 capsule 3  . pravastatin (PRAVACHOL) 80 MG tablet Take 1 tablet (80 mg total) by mouth daily. 30 tablet 9  . warfarin (COUMADIN) 5 MG tablet TAKE 1 TO 1 AND 1/2 TABLET BY MOUTH EVERY DAY AS DIRECTED BY COUMADIN CLINIC (Patient taking differently: Take 0.5(2.5mg ) tablet on Monday and Thursday, all other days 1(5mg )  tablet) 105 tablet 1    Assessment: 65 y.o. female presents with worsening angina found to have elevated troponins. Pt has PMH of mechanical AVR on PTA Coumadin. Coumadin currently on hold with plan for heparin once INR < 2 and possible cardiac cath once INR < 1.8. On recheck INR still elevated at 2.56 - continue to hold heparin for now.  Goal of Therapy:  Heparin level 0.3-0.7 units/ml Monitor platelets by anticoagulation protocol: Yes   Plan:  -F/U daily INR -Start heparin once INR < 2.0  65, PharmD PGY-1 Pharmacy Resident Pager: [phone]. 06/21/2016

## 2016-06-21 NOTE — Progress Notes (Signed)
Subjective:  No recurrent chest pain  Objective:   Vital Signs : Vitals:   06/20/16 2333 06/21/16 0007 06/21/16 0505 06/21/16 0740  BP: (!) 130/54  (!) 129/58 (!) 139/56  Pulse:  (!) 49  62  Resp: 15  13 15   Temp: 98 F (36.7 C)  98.5 F (36.9 C) 97.8 F (36.6 C)  TempSrc: Oral  Oral Oral  SpO2: 100%  97% 99%  Weight:      Height:        Intake/Output from previous day:  Intake/Output Summary (Last 24 hours) at 06/21/16 1154 Last data filed at 06/21/16 0630  Gross per 24 hour  Intake           545.69 ml  Output                0 ml  Net           545.69 ml    I/O since admission: +615    Wt Readings from Last 3 Encounters:  06/20/16 171 lb 1.2 oz (77.6 kg)  01/21/16 172 lb (78 kg)  05/28/15 167 lb 14.4 oz (76.2 kg)    Medications: . aspirin EC  81 mg Oral Daily  . atorvastatin  80 mg Oral q1800  . diphenhydrAMINE  25 mg Intravenous Pre-Cath  . famotidine (PEPCID) IV  20 mg Intravenous Pre-Cath  . methylPREDNISolone (SOLU-MEDROL) injection  125 mg Intravenous Pre-Cath  . metoprolol tartrate  25 mg Oral Q6H  . multivitamin with minerals  1 tablet Oral Daily  . pantoprazole  40 mg Oral Daily  . sodium chloride flush  3 mL Intravenous Q12H  . sodium chloride flush  3 mL Intravenous Q12H  . sodium chloride flush  3 mL Intravenous Q12H    . sodium chloride      Physical Exam:   General appearance: alert, cooperative and no distress Neck: no adenopathy, no JVD, supple, symmetrical, trachea midline and thyroid not enlarged, symmetric, no tenderness/mass/nodules Lungs: clear to auscultation bilaterally Heart: RRR with rare ectopy; crisp valve click with 2/6 systolic murmur Abdomen: soft, non-tender; bowel sounds normal; no masses,  no organomegaly Extremities: extremities normal, atraumatic, no cyanosis or edema Pulses: 2+ and symmetric Skin: Skin color, texture, turgor normal. No rashes or lesions Neurologic: Grossly normal   Rate: 68  Rhythm: normal  sinus rhythm  ECG (independently read by me): SB at 59; borderline LVH. Normal intervals  Lab Results:   Recent Labs  06/19/16 2309 06/20/16 0419 06/21/16 0253  NA 143 145 142  K 3.8 3.8 4.3  CL 109 109 110  CO2 25 28 28   GLUCOSE 114* 95 113*  BUN 12 12 14   CREATININE 1.06* 0.98 0.97  CALCIUM 9.9 9.8 9.7    Hepatic Function Latest Ref Rng & Units 07/14/2015 10/14/2013 11/26/2012  Total Protein 6.1 - 8.1 g/dL 6.3 6.9 6.4  Albumin 3.6 - 5.1 g/dL 3.9 4.1 3.1(L)  AST 10 - 35 U/L 48(H) 25 17  ALT 6 - 29 U/L 36(H) 29 12  Alk Phosphatase 33 - 130 U/L 108 123(H) 77  Total Bilirubin 0.2 - 1.2 mg/dL 0.5 0.8 1.2     Recent Labs  06/19/16 2309 06/21/16 0253  WBC 7.7 11.8*  HGB 12.6 12.3  HCT 39.8 38.5  MCV 78.2 78.3  PLT 212 211     Recent Labs  06/20/16 0419 06/20/16 0732 06/20/16 1355  TROPONINI 0.39* 0.33* 0.21*    Lab Results  Component Value Date  TSH 2.676 06/20/2016   No results for input(s): HGBA1C in the last 72 hours.  No results for input(s): PROT, ALBUMIN, AST, ALT, ALKPHOS, BILITOT, BILIDIR, IBILI in the last 72 hours.  Recent Labs  06/21/16 0253  INR 2.56   BNP (last 3 results) No results for input(s): BNP in the last 8760 hours.  ProBNP (last 3 results) No results for input(s): PROBNP in the last 8760 hours.   Lipid Panel     Component Value Date/Time   CHOL 142 06/20/2016 0419   CHOL 173 10/14/2013 0943   TRIG 62 06/20/2016 0419   TRIG 75 10/14/2013 0943   HDL 65 06/20/2016 0419   HDL 79 10/14/2013 0943   CHOLHDL 2.2 06/20/2016 0419   VLDL 12 06/20/2016 0419   LDLCALC 65 06/20/2016 0419   LDLCALC 79 10/14/2013 0943      Imaging:  Dg Chest 2 View  Result Date: 06/19/2016 CLINICAL DATA:  Initial evaluation for acute mid chest pain for 2 weeks. EXAM: CHEST  2 VIEW COMPARISON:  Prior radiograph from 01/15/2013. FINDINGS: Median sternotomy wires underlying CABG markers and surgical clips noted, stable. Cardiomegaly unchanged.  Mediastinal silhouette stable. Atheromatous plaque present within the aortic arch. Elevation of the left hemidiaphragm with associated left basilar atelectasis. No other focal airspace disease. No pulmonary edema or pleural effusion. No pneumothorax. No acute osseous abnormality. Surgical clips overlie the left lung apex and right axillary region. IMPRESSION: 1. Elevation left hemidiaphragm with associated mild left basilar atelectasis. 2. No other active cardiopulmonary disease. 3. Aortic atherosclerosis. Electronically Signed   By: Jeannine Boga M.D.   On: 06/19/2016 23:26      Assessment/Plan:   Principal Problem:   NSTEMI (non-ST elevated myocardial infarction) ([place]) Active Problems:   CAD-severe LM disease- S/P CABG X 2 (SVG-OM, SVG-LAD) 11/05/12 (hx of Lt SCA stenosis)   S/P AVR, St Jude 2000   Chronic anticoagulation for mechanical AVR   Contrast media allergy   HTN (hypertension)  1. NSTEMI: troponin 0.39 now trending downward. No recurrent chest pain 2. S/P CABG 2014 3. St Jude AVR: crisp valve click 4. Anticoagulation: last dose of warfarin was 9/17 at 4 pm when she took a whole 5 mg dose.(takes 1/2 dose on M and Thurs).  Coumadin on hold for cath. Too high today. Start heparin when INR < 2.0. Not a candidate for Vit K reversal with mechanical valve. 5. 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 6. Ascending aortic aneurysm replacement 7. AAA stable on last ultrsound 8. Essential HTN  Will cancel cath today and plan when INR appropiate  Troy Sine, MD, [facility] 06/21/2016, 11:54 AM

## 2016-06-21 NOTE — Progress Notes (Signed)
  Echocardiogram 2D Echocardiogram has been performed.  [name] 06/21/2016, 2:31 PM

## 2016-06-22 ENCOUNTER — Encounter (HOSPITAL_COMMUNITY): Payer: Self-pay | Admitting: *Deleted

## 2016-06-22 ENCOUNTER — Encounter (HOSPITAL_COMMUNITY)
Admission: EM | Disposition: A | Discharge status: Home / Self Care | Payer: Self-pay | Source: Home / Self Care | Attending: Cardiology

## 2016-06-22 DIAGNOSIS — Z7901 Long term (current) use of anticoagulants: Secondary | ICD-10-CM

## 2016-06-22 DIAGNOSIS — Z951 Presence of aortocoronary bypass graft: Secondary | ICD-10-CM

## 2016-06-22 HISTORY — PX: CARDIAC CATHETERIZATION: SHX172

## 2016-06-22 LAB — BASIC METABOLIC PANEL
ANION GAP: 6 (ref 5–15)
BUN: 17 mg/dL (ref 6–20)
CALCIUM: 9.4 mg/dL (ref 8.9–10.3)
CO2: 29 mmol/L (ref 22–32)
CREATININE: 0.9 mg/dL (ref 0.44–1.00)
Chloride: 106 mmol/L (ref 101–111)
GLUCOSE: 95 mg/dL (ref 65–99)
Potassium: 4.2 mmol/L (ref 3.5–5.1)
Sodium: 141 mmol/L (ref 135–145)

## 2016-06-22 LAB — PROTIME-INR
INR: 2.01
INR: 1.62
PROTHROMBIN TIME: 23.1 s — AB (ref 11.4–15.2)
PROTHROMBIN TIME: 19.4 s — AB (ref 11.4–15.2)

## 2016-06-22 LAB — CBC
HCT: 38.2 % (ref 36.0–46.0)
HEMOGLOBIN: 12 g/dL (ref 12.0–15.0)
MCH: 24.7 pg — AB (ref 26.0–34.0)
MCHC: 31.4 g/dL (ref 30.0–36.0)
MCV: 78.6 fL (ref 78.0–100.0)
PLATELETS: 170 10*3/uL (ref 150–400)
RBC: 4.86 MIL/uL (ref 3.87–5.11)
RDW: 16.7 % — ABNORMAL HIGH (ref 11.5–15.5)
WBC: 7.8 10*3/uL (ref 4.0–10.5)

## 2016-06-22 SURGERY — LEFT HEART CATH AND CORS/GRAFTS ANGIOGRAPHY

## 2016-06-22 MED ORDER — SODIUM CHLORIDE 0.9 % IV SOLN: INTRAVENOUS | Status: AC

## 2016-06-22 MED ORDER — FENTANYL CITRATE (PF) 100 MCG/2ML IJ SOLN
INTRAMUSCULAR | Status: AC
  Filled 2016-06-22: qty 2

## 2016-06-22 MED ORDER — METOPROLOL TARTRATE 5 MG/5ML IV SOLN
INTRAVENOUS | Status: AC
  Filled 2016-06-22: qty 5

## 2016-06-22 MED ORDER — LIDOCAINE HCL (PF) 1 % IJ SOLN
INTRAMUSCULAR | Status: AC
  Filled 2016-06-22: qty 30

## 2016-06-22 MED ORDER — HEPARIN (PORCINE) IN NACL 2-0.9 UNIT/ML-% IJ SOLN
INTRAMUSCULAR | Status: DC | PRN
  Administered 2016-06-22: 1000 mL

## 2016-06-22 MED ORDER — VERAPAMIL HCL 2.5 MG/ML IV SOLN
INTRAVENOUS | Status: DC | PRN
  Administered 2016-06-22: 10 mL via INTRA_ARTERIAL

## 2016-06-22 MED ORDER — VERAPAMIL HCL 2.5 MG/ML IV SOLN
INTRAVENOUS | Status: AC
  Filled 2016-06-22: qty 2

## 2016-06-22 MED ORDER — METOPROLOL TARTRATE 5 MG/5ML IV SOLN
INTRAVENOUS | Status: DC | PRN
  Administered 2016-06-22: 5 mg via INTRAVENOUS

## 2016-06-22 MED ORDER — FENTANYL CITRATE (PF) 100 MCG/2ML IJ SOLN
INTRAMUSCULAR | Status: DC | PRN
  Administered 2016-06-22 (×2): 25 ug via INTRAVENOUS

## 2016-06-22 MED ORDER — SODIUM CHLORIDE 0.9% FLUSH
3.0000 mL | Freq: Two times a day (BID) | INTRAVENOUS | Status: DC
  Administered 2016-06-22: 3 mL via INTRAVENOUS

## 2016-06-22 MED ORDER — IOPAMIDOL (ISOVUE-370) INJECTION 76%
INTRAVENOUS | Status: AC
  Filled 2016-06-22: qty 50

## 2016-06-22 MED ORDER — HEPARIN (PORCINE) IN NACL 100-0.45 UNIT/ML-% IJ SOLN
950.0000 [IU]/h | INTRAMUSCULAR | Status: DC
  Administered 2016-06-22: 950 [IU]/h via INTRAVENOUS
  Filled 2016-06-22: qty 250

## 2016-06-22 MED ORDER — SODIUM CHLORIDE 0.9 % IV SOLN: 250.0000 mL | INTRAVENOUS | Status: DC | PRN

## 2016-06-22 MED ORDER — IOPAMIDOL (ISOVUE-370) INJECTION 76%
INTRAVENOUS | Status: AC
Start: 2016-06-22 — End: 2016-06-22
  Filled 2016-06-22: qty 50

## 2016-06-22 MED ORDER — SODIUM CHLORIDE 0.9% FLUSH: 3.0000 mL | INTRAVENOUS | Status: DC | PRN

## 2016-06-22 MED ORDER — ONDANSETRON HCL 4 MG/2ML IJ SOLN: 4.0000 mg | Freq: Four times a day (QID) | INTRAMUSCULAR | Status: DC | PRN

## 2016-06-22 MED ORDER — LIDOCAINE HCL (PF) 1 % IJ SOLN
INTRAMUSCULAR | Status: DC | PRN
  Administered 2016-06-22: 2 mL

## 2016-06-22 MED ORDER — IOPAMIDOL (ISOVUE-370) INJECTION 76%
INTRAVENOUS | Status: AC
  Filled 2016-06-22: qty 100

## 2016-06-22 MED ORDER — MIDAZOLAM HCL 2 MG/2ML IJ SOLN
INTRAMUSCULAR | Status: DC | PRN
  Administered 2016-06-22: 2 mg via INTRAVENOUS

## 2016-06-22 MED ORDER — HEPARIN SODIUM (PORCINE) 1000 UNIT/ML IJ SOLN
INTRAMUSCULAR | Status: DC | PRN
  Administered 2016-06-22: 4000 [IU] via INTRAVENOUS

## 2016-06-22 MED ORDER — MIDAZOLAM HCL 2 MG/2ML IJ SOLN
INTRAMUSCULAR | Status: AC
  Filled 2016-06-22: qty 2

## 2016-06-22 MED ORDER — HEPARIN SODIUM (PORCINE) 1000 UNIT/ML IJ SOLN
INTRAMUSCULAR | Status: AC
  Filled 2016-06-22: qty 1

## 2016-06-22 MED ORDER — HEPARIN (PORCINE) IN NACL 2-0.9 UNIT/ML-% IJ SOLN
INTRAMUSCULAR | Status: AC
  Filled 2016-06-22: qty 1000

## 2016-06-22 MED ORDER — IOPAMIDOL (ISOVUE-370) INJECTION 76%
INTRAVENOUS | Status: DC | PRN
  Administered 2016-06-22: 160 mL

## 2016-06-22 SURGICAL SUPPLY — 13 items
CATH IMPULSE 5F ANG/FL3.5 (CATHETERS) ×1 IMPLANT
CATH INFINITI 5 FR AR2 MOD (CATHETERS) ×1 IMPLANT
CATH INFINITI 5 FR LCB (CATHETERS) ×1 IMPLANT
CATH SITESEER 5F MULTI A 2 (CATHETERS) ×1 IMPLANT
DEVICE RAD COMP TR BAND LRG (VASCULAR PRODUCTS) ×1 IMPLANT
GLIDESHEATH SLEND A-KIT 6F 22G (SHEATH) ×1 IMPLANT
GLIDESHEATH SLEND SS 6F .021 (SHEATH) ×1 IMPLANT
KIT HEART LEFT (KITS) ×2 IMPLANT
PACK CARDIAC CATHETERIZATION (CUSTOM PROCEDURE TRAY) ×2 IMPLANT
SYR MEDRAD MARK V 150ML (SYRINGE) ×2 IMPLANT
TRANSDUCER W/STOPCOCK (MISCELLANEOUS) ×2 IMPLANT
TUBING CIL FLEX 10 FLL-RA (TUBING) ×2 IMPLANT
WIRE SAFE-T 1.5MM-J .035X260CM (WIRE) ×1 IMPLANT

## 2016-06-22 NOTE — Progress Notes (Addendum)
ADDENDUM: To resume anticoagulation post-cath this evening. INR this afternoon was 1.62, pt taken to cath lab at 1400. Note that patient has a hx of mAVR (goal INR 2.5-3.5).  Plan: -Follow-up post-cath as patient will most likely need heparin bridge and coumadin restart  ANTICOAGULATION CONSULT NOTE - Follow-Up Consult  Pharmacy Consult for Heparin Indication: chest pain/ACS  Allergies  Allergen Reactions  . Contrast Media [Iodinated Diagnostic Agents] Hives, Itching and Swelling    Patient Measurements: Height: 5\' 7"  (170.2 cm) Weight: 171 lb 1.2 oz (77.6 kg) IBW/kg (Calculated) : 61.6  Heparin dosing weight: 77 kg  Vital Signs: Temp: 98.5 F (36.9 C) (09/21 0511) Temp Source: Oral (09/21 0511) BP: 139/55 (09/21 0511) Pulse Rate: 60 (09/21 0511)  Labs:  Recent Labs  06/19/16 2309  06/20/16 0419 06/20/16 0732 06/20/16 1355 06/20/16 1403 06/21/16 0253 06/22/16 0242  HGB 12.6  --   --   --   --   --  12.3 12.0  HCT 39.8  --   --   --   --   --  38.5 38.2  PLT 212  --   --   --   --   --  211 170  LABPROT  --   < > 27.7*  --   --  26.6* 28.0* 23.1*  INR  --   < > 2.53  --   --  2.40 2.56 2.01  CREATININE 1.06*  --  0.98  --   --   --  0.97 0.90  TROPONINI  --   < > 0.39* 0.33* 0.21*  --   --   --   < > = values in this interval not displayed.  Estimated Creatinine Clearance: 68.7 mL/min (by C-G formula based on SCr of 0.9 mg/dL).   Medical History: Past Medical History:  Diagnosis Date  . Anginal pain (HCC)   . Coronary artery disease    mech AVR, CABG  . Hyperlipidemia   . Hypertension   . RA (rheumatoid arthritis) (HCC)   . S/P AVR (aortic valve replacement) 08/1999   St. Jude AVR for aortic insuff  . 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 11/2012) - complicate by pericardial hematoma --> subxiphoid pericardial exposure and evacuation  . Sarcoid (HCC)   . Shortness of  breath   . 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    Current Outpatient Prescriptions on File Prior to Encounter  Medication Sig Dispense Refill  . metoprolol tartrate (LOPRESSOR) 25 MG tablet Take 0.5 tablets (12.5 mg total) by mouth 2 (two) times daily. 90 tablet 3  . omeprazole (PRILOSEC) 20 MG capsule TAKE 1 CAPSULE (20 MG TOTAL) BY MOUTH DAILY. 30 capsule 3  . pravastatin (PRAVACHOL) 80 MG tablet Take 1 tablet (80 mg total) by mouth daily. 30 tablet 9  . warfarin (COUMADIN) 5 MG tablet TAKE 1 TO 1 AND 1/2 TABLET BY MOUTH EVERY DAY AS DIRECTED BY COUMADIN CLINIC (Patient taking differently: Take 0.5(2.5mg ) tablet on Monday and Thursday, all other days 1(5mg )  tablet) 105 tablet 1    Assessment: 65 y.o. female presents with worsening angina found to have elevated troponins. Pt has PMH of mechanical AVR on PTA Coumadin. Coumadin currently on hold with plan for heparin once INR < 2 and possible cardiac  cath once INR < 1.8. INR this am is 2.01 - discussed with MD and he will recheck INR at 1200 today and proceed to cath if <2.0. No heparin this morning as patient will get heparin once she arrives to cath.  Goal of Therapy:  Heparin level 0.3-0.7 units/ml Monitor platelets by anticoagulation protocol: Yes   Plan:  -Follow-up INR at 1200 -Hold heparin for now, follow-up after cath lab  Fredonia Highland, PharmD PGY-1 Pharmacy Resident Pager: [phone]. 06/22/2016

## 2016-06-22 NOTE — Progress Notes (Signed)
Subjective:  No recurrent chest pain  Objective:   Vital Signs : Vitals:   06/21/16 2311 06/22/16 0511 06/22/16 0838 06/22/16 1000  BP: (!) 140/51 (!) 139/55 (!) 121/52 (!) 101/54  Pulse: 67 60 66 60  Resp: 17 15 14  (!) 28  Temp: 98.1 F (36.7 C) 98.5 F (36.9 C) 98.4 F (36.9 C)   TempSrc: Oral Oral Oral   SpO2: 100% 99% 100% 98%  Weight:      Height:        Intake/Output from previous day:  Intake/Output Summary (Last 24 hours) at 06/22/16 1049 Last data filed at 06/22/16 0900  Gross per 24 hour  Intake             2124 ml  Output                0 ml  Net             2124 ml    I/O since admission: +3219  Wt Readings from Last 3 Encounters:  06/20/16 171 lb 1.2 oz (77.6 kg)  01/21/16 172 lb (78 kg)  05/28/15 167 lb 14.4 oz (76.2 kg)    Medications: . aspirin EC  81 mg Oral Daily  . atorvastatin  80 mg Oral q1800  . metoprolol tartrate  25 mg Oral BID  . multivitamin with minerals  1 tablet Oral Daily  . pantoprazole  40 mg Oral Daily  . sodium chloride flush  3 mL Intravenous Q12H  . sodium chloride flush  3 mL Intravenous Q12H  . sodium chloride flush  3 mL Intravenous Q12H    . sodium chloride 1 mL/kg/hr (06/22/16 0700)    Physical Exam:   General appearance: alert, cooperative and no distress Neck: no adenopathy, no JVD, supple, symmetrical, trachea midline and thyroid not enlarged, symmetric, no tenderness/mass/nodules Lungs: clear to auscultation bilaterally Heart: RRR with rare ectopy; crisp valve click with 2/6 systolic murmur Abdomen: soft, non-tender; bowel sounds normal; no masses,  no organomegaly Extremities: extremities normal, atraumatic, no cyanosis or edema Pulses: 2+ and symmetric Skin: Skin color, texture, turgor normal. No rashes or lesions Neurologic: Grossly normal   Rate: 56  Rhythm: SB  ECG (independently read by me): SB at 59; borderline LVH. Normal intervals  Lab Results:   Recent Labs  06/20/16 0419  06/21/16 0253 06/22/16 0242  NA 145 142 141  K 3.8 4.3 4.2  CL 109 110 106  CO2 28 28 29   GLUCOSE 95 113* 95  BUN 12 14 17   CREATININE 0.98 0.97 0.90  CALCIUM 9.8 9.7 9.4    Hepatic Function Latest Ref Rng & Units 07/14/2015 10/14/2013 11/26/2012  Total Protein 6.1 - 8.1 g/dL 6.3 6.9 6.4  Albumin 3.6 - 5.1 g/dL 3.9 4.1 3.1(L)  AST 10 - 35 U/L 48(H) 25 17  ALT 6 - 29 U/L 36(H) 29 12  Alk Phosphatase 33 - 130 U/L 108 123(H) 77  Total Bilirubin 0.2 - 1.2 mg/dL 0.5 0.8 1.2     Recent Labs  06/19/16 2309 06/21/16 0253 06/22/16 0242  WBC 7.7 11.8* 7.8  HGB 12.6 12.3 12.0  HCT 39.8 38.5 38.2  MCV 78.2 78.3 78.6  PLT 212 211 170     Recent Labs  06/20/16 0419 06/20/16 0732 06/20/16 1355  TROPONINI 0.39* 0.33* 0.21*    Lab Results  Component Value Date   TSH 2.676 06/20/2016   No results for input(s): HGBA1C in the last 72 hours.  No  results for input(s): PROT, ALBUMIN, AST, ALT, ALKPHOS, BILITOT, BILIDIR, IBILI in the last 72 hours.  Recent Labs  06/22/16 0242  INR 2.01   BNP (last 3 results) No results for input(s): BNP in the last 8760 hours.  ProBNP (last 3 results) No results for input(s): PROBNP in the last 8760 hours.   Lipid Panel     Component Value Date/Time   CHOL 142 06/20/2016 0419   CHOL 173 10/14/2013 0943   TRIG 62 06/20/2016 0419   TRIG 75 10/14/2013 0943   HDL 65 06/20/2016 0419   HDL 79 10/14/2013 0943   CHOLHDL 2.2 06/20/2016 0419   VLDL 12 06/20/2016 0419   LDLCALC 65 06/20/2016 0419   LDLCALC 79 10/14/2013 0943      Imaging:  No results found.    Assessment/Plan:   Principal Problem:   NSTEMI (non-ST elevated myocardial infarction) ([place]) Active Problems:   CAD-severe LM disease- S/P CABG X 2 (SVG-OM, SVG-LAD) 11/05/12 (hx of Lt SCA stenosis)   S/P AVR, St Jude 2000   Chronic anticoagulation for mechanical AVR   Contrast media allergy   HTN (hypertension)  1. NSTEMI: troponin 0.21  trending downward. No recurrent  chest pain; no dyspnea 2. S/P CABG 2014 3. St Jude AVR: crisp valve click 4. Anticoagulation: last dose of warfarin was 9/17 at 4 pm when she took a whole 5 mg dose.(takes 1/2 dose on M and Thurs).  Coumadin on hold for cath. Not a candidate for Vit K reversal with mechanical valve. INR today 2.01 this am; will recheck at noon.  If drpos further can be done later today via radial approach. No LIMA graft. 5. 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 6. Ascending aortic aneurysm replacement 7. AAA stable on last ultrsound 8. Essential HTN  Will re-check INR; cath hopefully later today.  Troy Sine, MD, [facility] 06/22/2016, 10:49 AM

## 2016-06-22 NOTE — Interval H&P Note (Signed)
History and Physical Interval Note:  06/22/2016 1:56 PM  [name]  has presented today for cardiac catheterization, with the diagnosis of n stemi  The various methods of treatment have been discussed with the patient and family. After consideration of risks, benefits and other options for treatment, the patient has consented to  Procedure(s): Left Heart Cath and Cors/Grafts Angiography (N/A) with possible Percutaneous Coronary Intervention as a surgical intervention .    The patient's history has been reviewed, patient examined, no change in status, stable for surgery.  I have reviewed the patient's chart and labs.  Questions were answered to the patient's satisfaction.     Cath Lab Visit (complete for each Cath Lab visit)  Clinical Evaluation Leading to the Procedure:   ACS: Yes.    Non-ACS:    Anginal Classification: CCS IV  Anti-ischemic medical therapy: Minimal Therapy (1 class of medications)  Non-Invasive Test Results: No non-invasive testing performed  Prior CABG: Previous CABG   Bryan Lemma

## 2016-06-22 NOTE — Progress Notes (Signed)
Spoke with Victorino Dike from [employer] and updated her on patient status and most recent INR of 2.01. She stated that they would not perform heart cath with INR greater than 2 and that patient could have breakfast tray and they would recheck INR later today to assess further. Victorino Dike also stated IVF could be initiated as ordered and to go ahead and give morning baby aspirin. Patient updated on plan.

## 2016-06-22 NOTE — Progress Notes (Signed)
   ANTICOAGULATION CONSULT NOTE - Follow-Up Consult  Pharmacy Consult for Heparin Indication: chest pain/ACS  Allergies  Allergen Reactions  . Contrast Media [Iodinated Diagnostic Agents] Hives, Itching and Swelling    Patient Measurements: Height: 5\' 7"  (170.2 cm) Weight: 171 lb 1.2 oz (77.6 kg) IBW/kg (Calculated) : 61.6  Heparin dosing weight: 77 kg  Vital Signs: Temp: 97.8 F (36.6 C) (09/21 1158) Temp Source: Oral (09/21 1158) BP: 107/40 (09/21 1158) Pulse Rate: 60 (09/21 1158)  Labs:  Recent Labs  06/19/16 2309  06/20/16 0419 06/20/16 0732 06/20/16 1355  06/21/16 0253 06/22/16 0242 06/22/16 1209  HGB 12.6  --   --   --   --   --  12.3 12.0  --   HCT 39.8  --   --   --   --   --  38.5 38.2  --   PLT 212  --   --   --   --   --  211 170  --   LABPROT  --   < > 27.7*  --   --   < > 28.0* 23.1* 19.4*  INR  --   < > 2.53  --   --   < > 2.56 2.01 1.62  CREATININE 1.06*  --  0.98  --   --   --  0.97 0.90  --   TROPONINI  --   < > 0.39* 0.33* 0.21*  --   --   --   --   < > = values in this interval not displayed.  Estimated Creatinine Clearance: 68.7 mL/min (by C-G formula based on SCr of 0.9 mg/dL).    Assessment: 65 y.o. female presents with worsening angina found to have elevated troponins. Pt has PMH of mechanical AVR on PTA Coumadin. Pt is s/p cath this afternoon. Plan for restarting heparin 4 hrs after sheath removal (1506), likely cath again tomorrow.   Goal of Therapy:  Heparin level 0.3-0.7 units/ml Monitor platelets by anticoagulation protocol: Yes   Plan:  Start IV heparin 950 units/hr with no bolus at 1900 F/u heparin level at 0100 Daily heparin level and CBC F/u plan for restarting coumadin   65, PharmD, BCPS  Clinical Pharmacist  Pager: [phone]   06/22/2016

## 2016-06-22 NOTE — H&P (View-Only) (Signed)
Subjective:  No recurrent chest pain  Objective:   Vital Signs : Vitals:   06/21/16 2311 06/22/16 0511 06/22/16 0838 06/22/16 1000  BP: (!) 140/51 (!) 139/55 (!) 121/52 (!) 101/54  Pulse: 67 60 66 60  Resp: 17 15 14  (!) 28  Temp: 98.1 F (36.7 C) 98.5 F (36.9 C) 98.4 F (36.9 C)   TempSrc: Oral Oral Oral   SpO2: 100% 99% 100% 98%  Weight:      Height:        Intake/Output from previous day:  Intake/Output Summary (Last 24 hours) at 06/22/16 1049 Last data filed at 06/22/16 0900  Gross per 24 hour  Intake             2124 ml  Output                0 ml  Net             2124 ml    I/O since admission: +3219  Wt Readings from Last 3 Encounters:  06/20/16 171 lb 1.2 oz (77.6 kg)  01/21/16 172 lb (78 kg)  05/28/15 167 lb 14.4 oz (76.2 kg)    Medications: . aspirin EC  81 mg Oral Daily  . atorvastatin  80 mg Oral q1800  . metoprolol tartrate  25 mg Oral BID  . multivitamin with minerals  1 tablet Oral Daily  . pantoprazole  40 mg Oral Daily  . sodium chloride flush  3 mL Intravenous Q12H  . sodium chloride flush  3 mL Intravenous Q12H  . sodium chloride flush  3 mL Intravenous Q12H    . sodium chloride 1 mL/kg/hr (06/22/16 0700)    Physical Exam:   General appearance: alert, cooperative and no distress Neck: no adenopathy, no JVD, supple, symmetrical, trachea midline and thyroid not enlarged, symmetric, no tenderness/mass/nodules Lungs: clear to auscultation bilaterally Heart: RRR with rare ectopy; crisp valve click with 2/6 systolic murmur Abdomen: soft, non-tender; bowel sounds normal; no masses,  no organomegaly Extremities: extremities normal, atraumatic, no cyanosis or edema Pulses: 2+ and symmetric Skin: Skin color, texture, turgor normal. No rashes or lesions Neurologic: Grossly normal   Rate: 56  Rhythm: SB  ECG (independently read by me): SB at 59; borderline LVH. Normal intervals  Lab Results:   Recent Labs  06/20/16 0419  06/21/16 0253 06/22/16 0242  NA 145 142 141  K 3.8 4.3 4.2  CL 109 110 106  CO2 28 28 29   GLUCOSE 95 113* 95  BUN 12 14 17   CREATININE 0.98 0.97 0.90  CALCIUM 9.8 9.7 9.4    Hepatic Function Latest Ref Rng & Units 07/14/2015 10/14/2013 11/26/2012  Total Protein 6.1 - 8.1 g/dL 6.3 6.9 6.4  Albumin 3.6 - 5.1 g/dL 3.9 4.1 3.1(L)  AST 10 - 35 U/L 48(H) 25 17  ALT 6 - 29 U/L 36(H) 29 12  Alk Phosphatase 33 - 130 U/L 108 123(H) 77  Total Bilirubin 0.2 - 1.2 mg/dL 0.5 0.8 1.2     Recent Labs  06/19/16 2309 06/21/16 0253 06/22/16 0242  WBC 7.7 11.8* 7.8  HGB 12.6 12.3 12.0  HCT 39.8 38.5 38.2  MCV 78.2 78.3 78.6  PLT 212 211 170     Recent Labs  06/20/16 0419 06/20/16 0732 06/20/16 1355  TROPONINI 0.39* 0.33* 0.21*    Lab Results  Component Value Date   TSH 2.676 06/20/2016   No results for input(s): HGBA1C in the last 72 hours.  No  results for input(s): PROT, ALBUMIN, AST, ALT, ALKPHOS, BILITOT, BILIDIR, IBILI in the last 72 hours.  Recent Labs  06/22/16 0242  INR 2.01   BNP (last 3 results) No results for input(s): BNP in the last 8760 hours.  ProBNP (last 3 results) No results for input(s): PROBNP in the last 8760 hours.   Lipid Panel     Component Value Date/Time   CHOL 142 06/20/2016 0419   CHOL 173 10/14/2013 0943   TRIG 62 06/20/2016 0419   TRIG 75 10/14/2013 0943   HDL 65 06/20/2016 0419   HDL 79 10/14/2013 0943   CHOLHDL 2.2 06/20/2016 0419   VLDL 12 06/20/2016 0419   LDLCALC 65 06/20/2016 0419   LDLCALC 79 10/14/2013 0943      Imaging:  No results found.    Assessment/Plan:   Principal Problem:   NSTEMI (non-ST elevated myocardial infarction) ([place]) Active Problems:   CAD-severe LM disease- S/P CABG X 2 (SVG-OM, SVG-LAD) 11/05/12 (hx of Lt SCA stenosis)   S/P AVR, St Jude 2000   Chronic anticoagulation for mechanical AVR   Contrast media allergy   HTN (hypertension)  1. NSTEMI: troponin 0.21  trending downward. No recurrent  chest pain; no dyspnea 2. S/P CABG 2014 3. St Jude AVR: crisp valve click 4. Anticoagulation: last dose of warfarin was 9/17 at 4 pm when she took a whole 5 mg dose.(takes 1/2 dose on M and Thurs).  Coumadin on hold for cath. Not a candidate for Vit K reversal with mechanical valve. INR today 2.01 this am; will recheck at noon.  If drpos further can be done later today via radial approach. No LIMA graft. 5. 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 6. Ascending aortic aneurysm replacement 7. AAA stable on last ultrsound 8. Essential HTN  Will re-check INR; cath hopefully later today.  Troy Sine, MD, [facility] 06/22/2016, 10:49 AM

## 2016-06-23 ENCOUNTER — Encounter (HOSPITAL_COMMUNITY): Payer: Self-pay | Admitting: Cardiology

## 2016-06-23 ENCOUNTER — Encounter (HOSPITAL_COMMUNITY)
Admission: EM | Disposition: A | Discharge status: Home / Self Care | Payer: Self-pay | Source: Home / Self Care | Attending: Cardiology

## 2016-06-23 DIAGNOSIS — I10 Essential (primary) hypertension: Secondary | ICD-10-CM

## 2016-06-23 DIAGNOSIS — I25810 Atherosclerosis of coronary artery bypass graft(s) without angina pectoris: Secondary | ICD-10-CM

## 2016-06-23 DIAGNOSIS — I25118 Atherosclerotic heart disease of native coronary artery with other forms of angina pectoris: Secondary | ICD-10-CM

## 2016-06-23 DIAGNOSIS — Z951 Presence of aortocoronary bypass graft: Secondary | ICD-10-CM

## 2016-06-23 HISTORY — PX: CARDIAC CATHETERIZATION: SHX172

## 2016-06-23 LAB — COMPREHENSIVE METABOLIC PANEL
ALBUMIN: 3.3 g/dL — AB (ref 3.5–5.0)
ALT: 20 U/L (ref 14–54)
ANION GAP: 5 (ref 5–15)
AST: 26 U/L (ref 15–41)
Alkaline Phosphatase: 80 U/L (ref 38–126)
BUN: 10 mg/dL (ref 6–20)
CHLORIDE: 109 mmol/L (ref 101–111)
CO2: 27 mmol/L (ref 22–32)
CREATININE: 0.86 mg/dL (ref 0.44–1.00)
Calcium: 9.3 mg/dL (ref 8.9–10.3)
GFR calc non Af Amer: >60 mL/min (ref >60)
GLUCOSE: 84 mg/dL (ref 65–99)
Potassium: 4.1 mmol/L (ref 3.5–5.1)
SODIUM: 141 mmol/L (ref 135–145)
Total Bilirubin: 1.1 mg/dL (ref 0.3–1.2)
Total Protein: 6 g/dL — ABNORMAL LOW (ref 6.5–8.1)

## 2016-06-23 LAB — POCT ACTIVATED CLOTTING TIME
ACTIVATED CLOTTING TIME: 472 s
ACTIVATED CLOTTING TIME: 213 s
Activated Clotting Time: 175 seconds

## 2016-06-23 LAB — PROTIME-INR
INR: 1.5
PROTHROMBIN TIME: 18.2 s — AB (ref 11.4–15.2)

## 2016-06-23 LAB — CBC
HCT: 36.9 % (ref 36.0–46.0)
Hemoglobin: 11.9 g/dL — ABNORMAL LOW (ref 12.0–15.0)
MCH: 25 pg — AB (ref 26.0–34.0)
MCHC: 32.2 g/dL (ref 30.0–36.0)
MCV: 77.5 fL — ABNORMAL LOW (ref 78.0–100.0)
PLATELETS: 180 10*3/uL (ref 150–400)
RBC: 4.76 MIL/uL (ref 3.87–5.11)
RDW: 17 % — ABNORMAL HIGH (ref 11.5–15.5)
WBC: 6.6 10*3/uL (ref 4.0–10.5)

## 2016-06-23 LAB — HEPARIN LEVEL (UNFRACTIONATED)
HEPARIN UNFRACTIONATED: 0.58 [IU]/mL (ref 0.30–0.70)
HEPARIN UNFRACTIONATED: 0.41 [IU]/mL (ref 0.30–0.70)

## 2016-06-23 SURGERY — LEFT HEART CATH AND CORONARY ANGIOGRAPHY

## 2016-06-23 MED ORDER — SODIUM CHLORIDE 0.9% FLUSH
3.0000 mL | Freq: Two times a day (BID) | INTRAVENOUS | Status: DC
  Administered 2016-06-23 – 2016-06-24 (×3): 3 mL via INTRAVENOUS

## 2016-06-23 MED ORDER — HEPARIN (PORCINE) IN NACL 2-0.9 UNIT/ML-% IJ SOLN
INTRAMUSCULAR | Status: AC
  Filled 2016-06-23: qty 1000

## 2016-06-23 MED ORDER — HEPARIN (PORCINE) IN NACL 2-0.9 UNIT/ML-% IJ SOLN
INTRAMUSCULAR | Status: DC | PRN
  Administered 2016-06-23: 2000 mL

## 2016-06-23 MED ORDER — VERAPAMIL HCL 2.5 MG/ML IV SOLN
INTRAVENOUS | Status: AC
  Filled 2016-06-23: qty 2

## 2016-06-23 MED ORDER — NITROGLYCERIN IN D5W 200-5 MCG/ML-% IV SOLN
INTRAVENOUS | Status: AC
  Filled 2016-06-23: qty 250

## 2016-06-23 MED ORDER — CLOPIDOGREL BISULFATE 300 MG PO TABS
ORAL_TABLET | ORAL | Status: AC
  Filled 2016-06-23: qty 1

## 2016-06-23 MED ORDER — MIDAZOLAM HCL 2 MG/2ML IJ SOLN
INTRAMUSCULAR | Status: DC | PRN
  Administered 2016-06-23: 2 mg via INTRAVENOUS
  Administered 2016-06-23: 1 mg via INTRAVENOUS

## 2016-06-23 MED ORDER — WARFARIN - PHARMACIST DOSING INPATIENT
Freq: Every day | Status: DC
  Administered 2016-06-23 – 2016-06-24 (×2)

## 2016-06-23 MED ORDER — MORPHINE SULFATE (PF) 2 MG/ML IV SOLN
INTRAVENOUS | Status: AC
  Filled 2016-06-23: qty 1

## 2016-06-23 MED ORDER — IOPAMIDOL (ISOVUE-370) INJECTION 76%
INTRAVENOUS | Status: AC
  Filled 2016-06-23: qty 100

## 2016-06-23 MED ORDER — HEPARIN (PORCINE) IN NACL 100-0.45 UNIT/ML-% IJ SOLN: 950.0000 [IU]/h | INTRAMUSCULAR | Status: DC

## 2016-06-23 MED ORDER — CLOPIDOGREL BISULFATE 75 MG PO TABS
75.0000 mg | ORAL_TABLET | Freq: Every day | ORAL | Status: DC
  Administered 2016-06-24 – 2016-06-25 (×2): 75 mg via ORAL
  Filled 2016-06-23 (×2): qty 1

## 2016-06-23 MED ORDER — MIDAZOLAM HCL 2 MG/2ML IJ SOLN
INTRAMUSCULAR | Status: AC
  Filled 2016-06-23: qty 2

## 2016-06-23 MED ORDER — FENTANYL CITRATE (PF) 100 MCG/2ML IJ SOLN
INTRAMUSCULAR | Status: DC | PRN
Start: 2016-06-23 — End: 2016-06-23
  Administered 2016-06-23: 50 ug via INTRAVENOUS
  Administered 2016-06-23: 25 ug via INTRAVENOUS

## 2016-06-23 MED ORDER — BIVALIRUDIN 250 MG IV SOLR
INTRAVENOUS | Status: AC
  Filled 2016-06-23: qty 250

## 2016-06-23 MED ORDER — FENTANYL CITRATE (PF) 100 MCG/2ML IJ SOLN
INTRAMUSCULAR | Status: AC
  Filled 2016-06-23: qty 2

## 2016-06-23 MED ORDER — SODIUM CHLORIDE 0.9 % IV SOLN
INTRAVENOUS | Status: DC | PRN
  Administered 2016-06-23 (×2): 1.75 mg/kg/h via INTRAVENOUS

## 2016-06-23 MED ORDER — ATROPINE SULFATE 1 MG/10ML IJ SOSY
PREFILLED_SYRINGE | INTRAMUSCULAR | Status: AC
  Filled 2016-06-23: qty 10

## 2016-06-23 MED ORDER — HYDRALAZINE HCL 20 MG/ML IJ SOLN
10.0000 mg | Freq: Once | INTRAMUSCULAR | Status: AC
  Administered 2016-06-23: 10 mg via INTRAVENOUS
  Filled 2016-06-23: qty 1

## 2016-06-23 MED ORDER — SODIUM CHLORIDE 0.9 % IV SOLN: 250.0000 mL | INTRAVENOUS | Status: DC | PRN

## 2016-06-23 MED ORDER — FAMOTIDINE IN NACL 20-0.9 MG/50ML-% IV SOLN
20.0000 mg | INTRAVENOUS | Status: AC
  Administered 2016-06-23: 20 mg via INTRAVENOUS
  Filled 2016-06-23: qty 50

## 2016-06-23 MED ORDER — DIPHENHYDRAMINE HCL 50 MG/ML IJ SOLN
25.0000 mg | INTRAMUSCULAR | Status: AC
  Administered 2016-06-23: 25 mg via INTRAVENOUS
  Filled 2016-06-23: qty 1

## 2016-06-23 MED ORDER — VERAPAMIL HCL 2.5 MG/ML IV SOLN
INTRAVENOUS | Status: DC | PRN
  Administered 2016-06-23: 300 ug via INTRACORONARY
  Administered 2016-06-23: 100 ug via INTRACORONARY

## 2016-06-23 MED ORDER — METOPROLOL TARTRATE 5 MG/5ML IV SOLN
INTRAVENOUS | Status: DC | PRN
  Administered 2016-06-23: 5 mg via INTRAVENOUS

## 2016-06-23 MED ORDER — SODIUM CHLORIDE 0.9 % IV SOLN: INTRAVENOUS | Status: AC

## 2016-06-23 MED ORDER — SODIUM CHLORIDE 0.9 % IV SOLN: INTRAVENOUS | Status: DC

## 2016-06-23 MED ORDER — SODIUM CHLORIDE 0.9% FLUSH: 3.0000 mL | INTRAVENOUS | Status: DC | PRN

## 2016-06-23 MED ORDER — NITROGLYCERIN IN D5W 200-5 MCG/ML-% IV SOLN
INTRAVENOUS | Status: DC | PRN
  Administered 2016-06-23: 10 ug/min via INTRAVENOUS

## 2016-06-23 MED ORDER — ASPIRIN 81 MG PO CHEW: 81.0000 mg | CHEWABLE_TABLET | ORAL | Status: AC

## 2016-06-23 MED ORDER — MORPHINE SULFATE (PF) 2 MG/ML IV SOLN
2.0000 mg | Freq: Once | INTRAVENOUS | Status: AC
  Administered 2016-06-23: 2 mg via INTRAVENOUS

## 2016-06-23 MED ORDER — IOPAMIDOL (ISOVUE-370) INJECTION 76%
INTRAVENOUS | Status: DC | PRN
  Administered 2016-06-23: 140 mL via INTRAVENOUS

## 2016-06-23 MED ORDER — CLOPIDOGREL BISULFATE 300 MG PO TABS
ORAL_TABLET | ORAL | Status: DC | PRN
  Administered 2016-06-23: 600 mg via ORAL

## 2016-06-23 MED ORDER — NITROGLYCERIN 1 MG/10 ML FOR IR/CATH LAB
INTRA_ARTERIAL | Status: AC
  Filled 2016-06-23: qty 10

## 2016-06-23 MED ORDER — METHYLPREDNISOLONE SODIUM SUCC 125 MG IJ SOLR
125.0000 mg | INTRAMUSCULAR | Status: AC
  Administered 2016-06-23: 125 mg via INTRAVENOUS
  Filled 2016-06-23: qty 2

## 2016-06-23 MED ORDER — WARFARIN SODIUM 7.5 MG PO TABS
7.5000 mg | ORAL_TABLET | Freq: Once | ORAL | Status: AC
  Administered 2016-06-23: 7.5 mg via ORAL
  Filled 2016-06-23: qty 1

## 2016-06-23 MED ORDER — LIDOCAINE HCL (PF) 1 % IJ SOLN
INTRAMUSCULAR | Status: DC | PRN
  Administered 2016-06-23: 20 mL

## 2016-06-23 MED ORDER — LIDOCAINE HCL (PF) 1 % IJ SOLN
INTRAMUSCULAR | Status: AC
  Filled 2016-06-23: qty 30

## 2016-06-23 MED ORDER — BIVALIRUDIN BOLUS VIA INFUSION - CUPID
INTRAVENOUS | Status: DC | PRN
  Administered 2016-06-23: 58.2 mg via INTRAVENOUS

## 2016-06-23 SURGICAL SUPPLY — 20 items
BALLN TREK RX 2.5X20 (BALLOONS) ×2
BALLOON TREK RX 2.5X20 (BALLOONS) IMPLANT
CATH INFINITI 5 FR LCB (CATHETERS) ×1 IMPLANT
CATH INFINITI 5FR AL1 (CATHETERS) ×1 IMPLANT
CATH INFINITI 5FR ANG PIGTAIL (CATHETERS) ×1 IMPLANT
CATH INFINITI 5FR MULTPACK ANG (CATHETERS) ×1 IMPLANT
CATH INFINITI AR1 MOD (CATHETERS) ×1 IMPLANT
DEVICE CONTINUOUS FLUSH (MISCELLANEOUS) ×1 IMPLANT
DEVICE SPIDERFX EMB PROT 5MM (WIRE) ×1 IMPLANT
GUIDE CATH RUNWAY 6FR LCB (CATHETERS) ×1 IMPLANT
KIT ENCORE 26 ADVANTAGE (KITS) ×1 IMPLANT
KIT HEART LEFT (KITS) ×2 IMPLANT
PACK CARDIAC CATHETERIZATION (CUSTOM PROCEDURE TRAY) ×2 IMPLANT
SHEATH PINNACLE 5F 10CM (SHEATH) ×1 IMPLANT
SHEATH PINNACLE 6F 10CM (SHEATH) ×1 IMPLANT
STENT SYNERGY DES 4X16 (Permanent Stent) ×1 IMPLANT
TRANSDUCER W/STOPCOCK (MISCELLANEOUS) ×2 IMPLANT
TUBING CIL FLEX 10 FLL-RA (TUBING) ×2 IMPLANT
WIRE COUGAR XT STRL 190CM (WIRE) ×1 IMPLANT
WIRE EMERALD 3MM-J .035X150CM (WIRE) ×1 IMPLANT

## 2016-06-23 NOTE — Interval H&P Note (Signed)
Cath Lab Visit (complete for each Cath Lab visit)  Clinical Evaluation Leading to the Procedure:   ACS: Yes.    Non-ACS:    Anginal Classification: CCS IV  Anti-ischemic medical therapy: Minimal Therapy (1 class of medications)  Non-Invasive Test Results: No non-invasive testing performed  Prior CABG: Previous CABG      History and Physical Interval Note:  06/23/2016 3:16 PM  [name]  has presented today for surgery, with the diagnosis of relook  The various methods of treatment have been discussed with the patient and family. After consideration of risks, benefits and other options for treatment, the patient has consented to  Procedure(s): Left Heart Cath and Coronary Angiography (N/A) as a surgical intervention .  The patient's history has been reviewed, patient examined, no change in status, stable for surgery.  I have reviewed the patient's chart and labs.  Questions were answered to the patient's satisfaction.     [name]

## 2016-06-23 NOTE — Progress Notes (Signed)
ANTICOAGULATION CONSULT NOTE - Follow Up Consult  Pharmacy Consult for [place]ap[place] Indication: chest pain/ACS  Allergies  Allergen Reactions  . Contrast Media [Iodinated Diagnostic Agents] Hives, Itching and Swelling    Patient Measurements: Height: 5\' 7"  (170.2 cm) Weight: 171 lb 1.2 oz (77.6 kg) IBW/kg (Calculated) : 61.6 Heparin Dosing Weight: 77.6  Vital Signs: Temp: 98.3 F (36.8 C) (09/22 1128) Temp Source: Oral (09/22 1128) BP: 129/54 (09/22 1128) Pulse Rate: 53 (09/22 1128)  Labs:  Recent Labs  06/21/16 0253 06/22/16 0242 06/22/16 1209 06/23/16 0045 06/23/16 0059 06/23/16 1208  HGB 12.3 12.0  --  11.9*  --   --   HCT 38.5 38.2  --  36.9  --   --   PLT 211 170  --  180  --   --   LABPROT 28.0* 23.1* 19.4* 18.2*  --   --   INR 2.56 2.01 1.62 1.50  --   --   HEPARINUNFRC  --   --   --   --  0.41 0.58  CREATININE 0.97 0.90  --   --   --  0.86    Estimated Creatinine Clearance: 71.9 mL/min (by C-G formula based on SCr of 0.86 mg/dL).   Medications:  . sodium chloride    . heparin 950 Units/hr (06/22/16 1916)    Assessment: 65 y.o. female presents with worsening angina found to have elevated troponins. Pt has PMH of mechanical AVR on PTA Coumadin. Pt is s/p cath 9/21 - heparin restarted post cath, with another cath planned for today at 1430. Heparin level this afternoon is therapeutic at 0.58 on 950 units/hr. CBC is stable. No bleeding noted. Pt likely to be transitioned to Coumadin over the weekend.    Goal of Therapy:  Heparin level 0.3-0.7 units/ml Monitor platelets by anticoagulation protocol: Yes   Plan:  1. Continue IV heparin 950 units/hr  2. Monitor heparin level and CBC daily 3. Monitor for S/Sx of bleeding   10/21 06/23/2016,2:08 PM

## 2016-06-23 NOTE — Progress Notes (Signed)
   ANTICOAGULATION CONSULT NOTE - Follow-Up Consult  Pharmacy Consult for Heparin Indication: chest pain/ACS  Allergies  Allergen Reactions  . Contrast Media [Iodinated Diagnostic Agents] Hives, Itching and Swelling    Patient Measurements: Height: 5\' 7"  (170.2 cm) Weight: 171 lb 1.2 oz (77.6 kg) IBW/kg (Calculated) : 61.6  Heparin dosing weight: 77 kg  Vital Signs: Temp: 97.9 F (36.6 C) (09/22 0000) Temp Source: Oral (09/22 0000) BP: 115/56 (09/22 0000) Pulse Rate: 65 (09/22 0000)  Labs:  Recent Labs  06/20/16 0419 06/20/16 0732 06/20/16 1355  06/21/16 0253 06/22/16 0242 06/22/16 1209 06/23/16 0059  HGB  --   --   --   --  12.3 12.0  --   --   HCT  --   --   --   --  38.5 38.2  --   --   PLT  --   --   --   --  211 170  --   --   LABPROT 27.7*  --   --   < > 28.0* 23.1* 19.4*  --   INR 2.53  --   --   < > 2.56 2.01 1.62  --   HEPARINUNFRC  --   --   --   --   --   --   --  0.41  CREATININE 0.98  --   --   --  0.97 0.90  --   --   TROPONINI 0.39* 0.33* 0.21*  --   --   --   --   --   < > = values in this interval not displayed.  Estimated Creatinine Clearance: 68.7 mL/min (by C-G formula based on SCr of 0.9 mg/dL).    Assessment: 65 y.o. female presents with worsening angina found to have elevated troponins. Pt has PMH of mechanical AVR on PTA Coumadin. Pt is s/p cath 9/21 - heparin restarted post cath, llikely cath again tomorrow. Heparin level therapeutic on 950 units/hr. No bleeding noted.  Goal of Therapy:  Heparin level 0.3-0.7 units/ml Monitor platelets by anticoagulation protocol: Yes   Plan:  Continue IV heparin 950 units/hr  Daily heparin level and CBC F/u further cath plans  10/21, PharmD, BCPS Clinical pharmacist, pager [phone]  06/23/2016 2:17 AM

## 2016-06-23 NOTE — H&P (View-Only) (Signed)
Subjective:  No recurrent chest pain  Objective:   Vital Signs : Vitals:   06/23/16 0348 06/23/16 0400 06/23/16 0756 06/23/16 0800  BP: (!) 147/54  118/66   Pulse: (!) 56 (!) 53 77 66  Resp: _0 Temp: 98.3 F (36.8 C)  98.1 F (36.7 C)   TempSrc: Oral  Oral   SpO2: 100% 100% 100% 100%  Weight:      Height:        Intake/Output from previous day:  Intake/Output Summary (Last 24 hours) at 06/23/16 0855 Last data filed at 06/23/16 0730  Gross per 24 hour  Intake          1275.72 ml  Output              700 ml  Net           575.72 ml    I/O since admission: +3717  Wt Readings from Last 3 Encounters:  06/20/16 171 lb 1.2 oz (77.6 kg)  01/21/16 172 lb (78 kg)  05/28/15 167 lb 14.4 oz (76.2 kg)    Medications: . aspirin EC  81 mg Oral Daily  . atorvastatin  80 mg Oral q1800  . metoprolol tartrate  25 mg Oral BID    . heparin 950 Units/hr (06/22/16 1916)    Physical Exam:   General appearance: alert, cooperative and no distress Neck: no adenopathy, no JVD, supple, symmetrical, trachea midline and thyroid not enlarged, symmetric, no tenderness/mass/nodules Lungs: clear to auscultation bilaterally Heart: RRR with rare ectopy; crisp valve click with 2/6 systolic murmur Abdomen: soft, non-tender; bowel sounds normal; no masses,  no organomegaly Extremities: extremities normal, atraumatic, no cyanosis or edema Pulses: 2+ and symmetric Skin: Skin color, texture, turgor normal. No rashes or lesions Neurologic: Grossly normal   Rate: 62  Rhythm: NSR  ECG (independently read by me): SB at 59; borderline LVH. Normal intervals  Lab Results:   Recent Labs  06/21/16 0253 06/22/16 0242  NA 142 141  K 4.3 4.2  CL 110 106  CO2 28 29  GLUCOSE 113* 95  BUN 14 17  CREATININE 0.97 0.90  CALCIUM 9.7 9.4    Hepatic Function Latest Ref Rng & Units 07/14/2015 10/14/2013 11/26/2012  Total Protein 6.1 - 8.1 g/dL 6.3 6.9 6.4  Albumin 3.6 - 5.1 g/dL 3.9 4.1  3.1(L)  AST 10 - 35 U/L 48(H) 25 17  ALT 6 - 29 U/L 36(H) 29 12  Alk Phosphatase 33 - 130 U/L 108 123(H) 77  Total Bilirubin 0.2 - 1.2 mg/dL 0.5 0.8 1.2     Recent Labs  06/21/16 0253 06/22/16 0242 06/23/16 0045  WBC 11.8* 7.8 6.6  HGB 12.3 12.0 11.9*  HCT 38.5 38.2 36.9  MCV 78.3 78.6 77.5*  PLT 211 170 180     Recent Labs  06/20/16 1355  TROPONINI 0.21*    Lab Results  Component Value Date   TSH 2.676 06/20/2016   No results for input(s): HGBA1C in the last 72 hours.  No results for input(s): PROT, ALBUMIN, AST, ALT, ALKPHOS, BILITOT, BILIDIR, IBILI in the last 72 hours.  Recent Labs  06/23/16 0045  INR 1.50   BNP (last 3 results) No results for input(s): BNP in the last 8760 hours.  ProBNP (last 3 results) No results for input(s): PROBNP in the last 8760 hours.   Lipid Panel     Component Value Date/Time   CHOL 142 06/20/2016 0419   CHOL 173  10/14/2013 0943   TRIG 62 06/20/2016 0419   TRIG 75 10/14/2013 0943   HDL 65 06/20/2016 0419   HDL 79 10/14/2013 0943   CHOLHDL 2.2 06/20/2016 0419   VLDL 12 06/20/2016 0419   LDLCALC 65 06/20/2016 0419   LDLCALC 79 10/14/2013 0943      Imaging:  No results found.   Left Heart Cath and Cors/Grafts Angiography       Y Graft: SVG-LAD & SVG-OM1 large and anatomically normal - however, in some imaging as there appears to be a possible ostial lesion as significant as 45 or 70%.  Ost LM to LM lesion, 95 %stenosed.  Ost 1st Mrg lesion, 90 %stenosed. There is minimal retrograde flow from the vein graft to the small branch with a relatively large somewhat unprotected circumflex and at least 2 more OM branches.  Ost RCA to Prox RCA lesion, 100 %stenosed.  Ost LAD to Prox LAD lesion, 90 %stenosed.  Normally functioning mechanical aortic valve.   Unclear what the true etiology of this patient's symptoms is.  This could simply be related to accelerated hypertension with existing disease. The existing  left main disease is essentially stable from prior to CABG, however the large codominant circumflex with at least 2 branches is somewhat unprotected by the vein graft to the OM.  Very difficult to catheter manipulation from radial access given the durations in the patient's anatomy following multiple surgeries.  Recommendations:  Return to TCU, start heparin 4 hours after sheath removal. - For ACS and mechanical valve.  Will review images additionally interventional colleagues (I did review with Dr. Angelena Form) - would consider returning to the catheterization lab tomorrow for femoral access. Would reassess the vein graft ostium to ensure  that there is no true ostial lesion.   Would also then consider protected left main stenting to provide in-line flow to the large essentially unprotected codominant circumflex and OM branches.          Assessment/Plan:   Principal Problem:   NSTEMI (non-ST elevated myocardial infarction) ([place]) Active Problems:   CAD-severe LM disease- S/P CABG X 2 (SVG-OM, SVG-LAD) 11/05/12 (hx of Lt SCA stenosis)   S/P AVR, St Jude 2000   Chronic anticoagulation for mechanical AVR   Contrast media allergy   HTN (hypertension)   Hx of CABG  1. NSTEMI: troponin 0.21  trending downward. No recurrent chest pain; no dyspnea 2. S/P CABG 2014 3. St Jude AVR: crisp valve click 4. Anticoagulation: last dose of warfarin was 9/17 at 4 pm.  INR yesterday afternoon was 1.62, today 1.5; now on heparin 5. 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 6. Ascending aortic aneurysm replacement 7. AAA stable on last ultrsound 8. Essential HTN  Cath data by Dr. Ellyn Hack reviewed. Will review cines.  Plan for femoral approach cath later today to better clarify ostial Y graft narrowing, and potential consideration for protected LM stenting to optimize distal LCX flow since graft fills OM with proximal OM stenosis prior to graft insertion.    Troy Sine, MD, [facility] 06/23/2016, 8:55 AM

## 2016-06-23 NOTE — Progress Notes (Signed)
   ANTICOAGULATION CONSULT NOTE - Follow-Up Consult  Pharmacy Consult for Heparin/coumadin Indication: chest pain/ACS/St. Jude AVR  Allergies  Allergen Reactions  . Contrast Media [Iodinated Diagnostic Agents] Hives, Itching and Swelling    Patient Measurements: Height: 5\' 7"  (170.2 cm) Weight: 171 lb 1.2 oz (77.6 kg) IBW/kg (Calculated) : 61.6  Heparin dosing weight: 77 kg  Vital Signs: Temp: 98.3 F (36.8 C) (09/22 1128) Temp Source: Oral (09/22 1128) BP: 129/71 (09/22 1701) Pulse Rate: 58 (09/22 1706)  Labs:  Recent Labs  06/21/16 0253 06/22/16 0242 06/22/16 1209 06/23/16 0045 06/23/16 0059 06/23/16 1208  HGB 12.3 12.0  --  11.9*  --   --   HCT 38.5 38.2  --  36.9  --   --   PLT 211 170  --  180  --   --   LABPROT 28.0* 23.1* 19.4* 18.2*  --   --   INR 2.56 2.01 1.62 1.50  --   --   HEPARINUNFRC  --   --   --   --  0.41 0.58  CREATININE 0.97 0.90  --   --   --  0.86    Estimated Creatinine Clearance: 71.9 mL/min (by C-G formula based on SCr of 0.86 mg/dL).    Assessment: 65 y.o. female on coumadin PTA for St. Jude AVR s/p cath, will resume heparin and coumadin tonight. INR 1.5 this morning. Sheath out at ~ 1700. Previously therapeutic on 950 units/hr  PTA Coumadin dose 5 mg daily except 2.5 mg on Mon/Thurs. Last dose 9/19    Goal of Therapy:  INR 2.5-3.5 Heparin level 0.3-0.7 units/ml Monitor platelets by anticoagulation protocol: Yes   Plan:  Start IV heparin 950 units/hr with no bolus at 0100 F/u heparin level at 0700 Coumadin 7.5 mg po x 1 Daily PT/INR, heparin level and CBC  10/19, PharmD, BCPS  Clinical Pharmacist  Pager: [phone]  06/23/2016

## 2016-06-23 NOTE — Progress Notes (Signed)
Subjective:  No recurrent chest pain  Objective:   Vital Signs : Vitals:   06/23/16 0348 06/23/16 0400 06/23/16 0756 06/23/16 0800  BP: (!) 147/54  118/66   Pulse: (!) 56 (!) 53 77 66  Resp: _0 Temp: 98.3 F (36.8 C)  98.1 F (36.7 C)   TempSrc: Oral  Oral   SpO2: 100% 100% 100% 100%  Weight:      Height:        Intake/Output from previous day:  Intake/Output Summary (Last 24 hours) at 06/23/16 0855 Last data filed at 06/23/16 0730  Gross per 24 hour  Intake          1275.72 ml  Output              700 ml  Net           575.72 ml    I/O since admission: +3717  Wt Readings from Last 3 Encounters:  06/20/16 171 lb 1.2 oz (77.6 kg)  01/21/16 172 lb (78 kg)  05/28/15 167 lb 14.4 oz (76.2 kg)    Medications: . aspirin EC  81 mg Oral Daily  . atorvastatin  80 mg Oral q1800  . metoprolol tartrate  25 mg Oral BID    . heparin 950 Units/hr (06/22/16 1916)    Physical Exam:   General appearance: alert, cooperative and no distress Neck: no adenopathy, no JVD, supple, symmetrical, trachea midline and thyroid not enlarged, symmetric, no tenderness/mass/nodules Lungs: clear to auscultation bilaterally Heart: RRR with rare ectopy; crisp valve click with 2/6 systolic murmur Abdomen: soft, non-tender; bowel sounds normal; no masses,  no organomegaly Extremities: extremities normal, atraumatic, no cyanosis or edema Pulses: 2+ and symmetric Skin: Skin color, texture, turgor normal. No rashes or lesions Neurologic: Grossly normal   Rate: 62  Rhythm: NSR  ECG (independently read by me): SB at 59; borderline LVH. Normal intervals  Lab Results:   Recent Labs  06/21/16 0253 06/22/16 0242  NA 142 141  K 4.3 4.2  CL 110 106  CO2 28 29  GLUCOSE 113* 95  BUN 14 17  CREATININE 0.97 0.90  CALCIUM 9.7 9.4    Hepatic Function Latest Ref Rng & Units 07/14/2015 10/14/2013 11/26/2012  Total Protein 6.1 - 8.1 g/dL 6.3 6.9 6.4  Albumin 3.6 - 5.1 g/dL 3.9 4.1  3.1(L)  AST 10 - 35 U/L 48(H) 25 17  ALT 6 - 29 U/L 36(H) 29 12  Alk Phosphatase 33 - 130 U/L 108 123(H) 77  Total Bilirubin 0.2 - 1.2 mg/dL 0.5 0.8 1.2     Recent Labs  06/21/16 0253 06/22/16 0242 06/23/16 0045  WBC 11.8* 7.8 6.6  HGB 12.3 12.0 11.9*  HCT 38.5 38.2 36.9  MCV 78.3 78.6 77.5*  PLT 211 170 180     Recent Labs  06/20/16 1355  TROPONINI 0.21*    Lab Results  Component Value Date   TSH 2.676 06/20/2016   No results for input(s): HGBA1C in the last 72 hours.  No results for input(s): PROT, ALBUMIN, AST, ALT, ALKPHOS, BILITOT, BILIDIR, IBILI in the last 72 hours.  Recent Labs  06/23/16 0045  INR 1.50   BNP (last 3 results) No results for input(s): BNP in the last 8760 hours.  ProBNP (last 3 results) No results for input(s): PROBNP in the last 8760 hours.   Lipid Panel     Component Value Date/Time   CHOL 142 06/20/2016 0419   CHOL 173  10/14/2013 0943   TRIG 62 06/20/2016 0419   TRIG 75 10/14/2013 0943   HDL 65 06/20/2016 0419   HDL 79 10/14/2013 0943   CHOLHDL 2.2 06/20/2016 0419   VLDL 12 06/20/2016 0419   LDLCALC 65 06/20/2016 0419   LDLCALC 79 10/14/2013 0943      Imaging:  No results found.   Left Heart Cath and Cors/Grafts Angiography       Y Graft: SVG-LAD & SVG-OM1 large and anatomically normal - however, in some imaging as there appears to be a possible ostial lesion as significant as 45 or 70%.  Ost LM to LM lesion, 95 %stenosed.  Ost 1st Mrg lesion, 90 %stenosed. There is minimal retrograde flow from the vein graft to the small branch with a relatively large somewhat unprotected circumflex and at least 2 more OM branches.  Ost RCA to Prox RCA lesion, 100 %stenosed.  Ost LAD to Prox LAD lesion, 90 %stenosed.  Normally functioning mechanical aortic valve.   Unclear what the true etiology of this patient's symptoms is.  This could simply be related to accelerated hypertension with existing disease. The existing  left main disease is essentially stable from prior to CABG, however the large codominant circumflex with at least 2 branches is somewhat unprotected by the vein graft to the OM.  Very difficult to catheter manipulation from radial access given the durations in the patient's anatomy following multiple surgeries.  Recommendations:  Return to TCU, start heparin 4 hours after sheath removal. - For ACS and mechanical valve.  Will review images additionally interventional colleagues (I did review with Dr. Angelena Form) - would consider returning to the catheterization lab tomorrow for femoral access. Would reassess the vein graft ostium to ensure  that there is no true ostial lesion.   Would also then consider protected left main stenting to provide in-line flow to the large essentially unprotected codominant circumflex and OM branches.          Assessment/Plan:   Principal Problem:   NSTEMI (non-ST elevated myocardial infarction) ([place]) Active Problems:   CAD-severe LM disease- S/P CABG X 2 (SVG-OM, SVG-LAD) 11/05/12 (hx of Lt SCA stenosis)   S/P AVR, St Jude 2000   Chronic anticoagulation for mechanical AVR   Contrast media allergy   HTN (hypertension)   Hx of CABG  1. NSTEMI: troponin 0.21  trending downward. No recurrent chest pain; no dyspnea 2. S/P CABG 2014 3. St Jude AVR: crisp valve click 4. Anticoagulation: last dose of warfarin was 9/17 at 4 pm.  INR yesterday afternoon was 1.62, today 1.5; now on heparin 5. 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 6. Ascending aortic aneurysm replacement 7. AAA stable on last ultrsound 8. Essential HTN  Cath data by Dr. Ellyn Hack reviewed. Will review cines.  Plan for femoral approach cath later today to better clarify ostial Y graft narrowing, and potential consideration for protected LM stenting to optimize distal LCX flow since graft fills OM with proximal OM stenosis prior to graft insertion.    Troy Sine, MD, [facility] 06/23/2016, 8:55 AM

## 2016-06-24 LAB — CBC
HEMATOCRIT: 36.2 % (ref 36.0–46.0)
HEMOGLOBIN: 11.6 g/dL — AB (ref 12.0–15.0)
MCH: 24.8 pg — AB (ref 26.0–34.0)
MCHC: 32 g/dL (ref 30.0–36.0)
MCV: 77.5 fL — AB (ref 78.0–100.0)
Platelets: 210 10*3/uL (ref 150–400)
RBC: 4.67 MIL/uL (ref 3.87–5.11)
RDW: 16.4 % — ABNORMAL HIGH (ref 11.5–15.5)
WBC: 9.9 10*3/uL (ref 4.0–10.5)

## 2016-06-24 LAB — BASIC METABOLIC PANEL
Anion gap: 5 (ref 5–15)
BUN: 18 mg/dL (ref 6–20)
CALCIUM: 9 mg/dL (ref 8.9–10.3)
CO2: 23 mmol/L (ref 22–32)
Chloride: 111 mmol/L (ref 101–111)
Creatinine, Ser: 0.98 mg/dL (ref 0.44–1.00)
GFR calc Af Amer: >60 mL/min (ref >60)
GLUCOSE: 211 mg/dL — AB (ref 65–99)
POTASSIUM: 4 mmol/L (ref 3.5–5.1)
SODIUM: 139 mmol/L (ref 135–145)

## 2016-06-24 LAB — PROTIME-INR
INR: 1.27
Prothrombin Time: 16 seconds — ABNORMAL HIGH (ref 11.4–15.2)

## 2016-06-24 LAB — HEPARIN LEVEL (UNFRACTIONATED)
Heparin Unfractionated: 0.46 IU/mL (ref 0.30–0.70)
Heparin Unfractionated: 0.51 IU/mL (ref 0.30–0.70)

## 2016-06-24 MED ORDER — HEPARIN (PORCINE) IN NACL 100-0.45 UNIT/ML-% IJ SOLN
950.0000 [IU]/h | INTRAMUSCULAR | Status: DC
  Administered 2016-06-24 – 2016-06-25 (×2): 950 [IU]/h via INTRAVENOUS
  Filled 2016-06-24 (×2): qty 250

## 2016-06-24 MED ORDER — WARFARIN SODIUM 7.5 MG PO TABS
7.5000 mg | ORAL_TABLET | Freq: Once | ORAL | Status: AC
Start: 2016-06-24 — End: 2016-06-24
  Administered 2016-06-24: 7.5 mg via ORAL
  Filled 2016-06-24: qty 1

## 2016-06-24 NOTE — Progress Notes (Signed)
   ANTICOAGULATION CONSULT NOTE - Follow-Up Consult  Pharmacy Consult for Heparin/coumadin Indication: chest pain/ACS/St. Jude AVR  Allergies  Allergen Reactions  . Contrast Media [Iodinated Diagnostic Agents] Hives, Itching and Swelling    Patient Measurements: Height: 5\' 7"  (170.2 cm) Weight: 171 lb 1.2 oz (77.6 kg) IBW/kg (Calculated) : 61.6  Heparin dosing weight: 77 kg  Vital Signs: Temp: 97.6 F (36.4 C) (09/23 1208) Temp Source: Oral (09/23 1208) BP: 137/56 (09/23 1208) Pulse Rate: 56 (09/23 1208)  Labs:  Recent Labs  06/22/16 0242 06/22/16 1209 06/23/16 0045 06/23/16 0059 06/23/16 1208 06/24/16 0440 06/24/16 1348  HGB 12.0  --  11.9*  --   --  11.6*  --   HCT 38.2  --  36.9  --   --  36.2  --   PLT 170  --  180  --   --  210  --   LABPROT 23.1* 19.4* 18.2*  --   --  16.0*  --   INR 2.01 1.62 1.50  --   --  1.27  --   HEPARINUNFRC  --   --   --  0.41 0.58  --  0.46  CREATININE 0.90  --   --   --  0.86 0.98  --     Estimated Creatinine Clearance: 63.1 mL/min (by C-G formula based on SCr of 0.98 mg/dL).    Assessment: 65 y.o. female on coumadin PTA for St. Jude AVR s/p cath, will resume heparin and coumadin on 9/22. INR on admit slightly below goal at 2.4.  INR this am remains SUBtherapeutic at 1.27, HL therapeutic at 0.46. CBC low but stable, no overt s/s bleeding reported.   PTA Coumadin dose 5 mg daily except 2.5 mg on Mon/Thurs. Last dose 9/19    Goal of Therapy:  INR 2.5-3.5 Heparin level 0.3-0.7 units/ml Monitor platelets by anticoagulation protocol: Yes   Plan:  Continue heparin gtt at 950 units/hr F/u 6 hour HL to confirm  Coumadin 7.5 mg po x 1 Daily PT/INR, heparin level and CBC  Teila Skalsky K. 10/19, PharmD, BCPS, CPP Clinical Pharmacist Pager: [phone] Phone: [phone] 06/24/2016 3:17 PM

## 2016-06-24 NOTE — Progress Notes (Addendum)
Subjective:   Underwent stenting of ostial lesion of SVG-LAD yesterday. Feels good. No CP/   Objective:   Vital Signs : Vitals:   06/24/16 0400 06/24/16 0700 06/24/16 0800 06/24/16 0833  BP:  (!) 121/50 (!) 124/57   Pulse:  60 (!) 48 62  Resp:  14 14   Temp: 98.1 F (36.7 C)  98.7 F (37.1 C)   TempSrc: Oral  Oral   SpO2:  99% 100%   Weight:      Height:        Intake/Output from previous day:  Intake/Output Summary (Last 24 hours) at 06/24/16 1138 Last data filed at 06/24/16 1000  Gross per 24 hour  Intake          1084.13 ml  Output              951 ml  Net           133.13 ml    I/O since admission: +3717  Wt Readings from Last 3 Encounters:  06/20/16 77.6 kg (171 lb 1.2 oz)  01/21/16 78 kg (172 lb)  05/28/15 76.2 kg (167 lb 14.4 oz)    Medications: . aspirin EC  81 mg Oral Daily  . atorvastatin  80 mg Oral q1800  . clopidogrel  75 mg Oral Q breakfast  . metoprolol tartrate  25 mg Oral BID  . sodium chloride flush  3 mL Intravenous Q12H  . Warfarin - Pharmacist Dosing Inpatient   Does not apply q1800    . heparin 950 Units/hr (06/24/16 1000)    Physical Exam:   General appearance: alert, cooperative and no distress Neck: no adenopathy, no JVD, supple, symmetrical, trachea midline and thyroid not enlarged, symmetric, no tenderness/mass/nodules Lungs: clear to auscultation bilaterally Heart: RRR with rare ectopy; crisp valve click with 2/6 systolic murmur Abdomen: soft, non-tender; bowel sounds normal; no masses,  no organomegaly Extremities: extremities normal, atraumatic, no cyanosis or edema Pulses: 2+ and symmetric Skin: Skin color, texture, turgor normal. No rashes or lesions Neurologic: Grossly normal   Rate: 60s  Rhythm: NSR    Lab Results:   Recent Labs  06/22/16 0242 06/23/16 1208 06/24/16 0440  NA 141 141 139  K 4.2 4.1 4.0  CL 106 109 111  CO2 _0 GLUCOSE 95 84 211*  BUN _1 CREATININE 0.90 0.86 0.98    CALCIUM 9.4 9.3 9.0    Hepatic Function Latest Ref Rng & Units 06/23/2016 07/14/2015 10/14/2013  Total Protein 6.5 - 8.1 g/dL 6.0(L) 6.3 6.9  Albumin 3.5 - 5.0 g/dL 3.3(L) 3.9 4.1  AST 15 - 41 U/L 26 48(H) 25  ALT 14 - 54 U/L 20 36(H) 29  Alk Phosphatase 38 - 126 U/L 80 108 123(H)  Total Bilirubin 0.3 - 1.2 mg/dL 1.1 0.5 0.8     Recent Labs  06/22/16 0242 06/23/16 0045 06/24/16 0440  WBC 7.8 6.6 9.9  HGB 12.0 11.9* 11.6*  HCT 38.2 36.9 36.2  MCV 78.6 77.5* 77.5*  PLT 170 180 210    No results for input(s): TROPONINI in the last 72 hours.  Invalid input(s): CK, MB  Lab Results  Component Value Date   TSH 2.676 06/20/2016   No results for input(s): HGBA1C in the last 72 hours.   Recent Labs  06/23/16 1208  PROT 6.0*  ALBUMIN 3.3*  AST 26  ALT 20  ALKPHOS 80  BILITOT 1.1    Recent Labs  06/24/16 0440  INR 1.27   BNP (last 3 results) No results for input(s): BNP in the last 8760 hours.  ProBNP (last 3 results) No results for input(s): PROBNP in the last 8760 hours.   Lipid Panel     Component Value Date/Time   CHOL 142 06/20/2016 0419   CHOL 173 10/14/2013 0943   TRIG 62 06/20/2016 0419   TRIG 75 10/14/2013 0943   HDL 65 06/20/2016 0419   HDL 79 10/14/2013 0943   CHOLHDL 2.2 06/20/2016 0419   VLDL 12 06/20/2016 0419   LDLCALC 65 06/20/2016 0419   LDLCALC 79 10/14/2013 0943      Imaging:  No results found.   Left Heart Cath and Cors/Grafts Angiography       Y Graft: SVG-LAD & SVG-OM1 large and anatomically normal - however, in some imaging as there appears to be a possible ostial lesion as significant as 45 or 70%.  Ost LM to LM lesion, 95 %stenosed.  Ost 1st Mrg lesion, 90 %stenosed. There is minimal retrograde flow from the vein graft to the small branch with a relatively large somewhat unprotected circumflex and at least 2 more OM branches.  Ost RCA to Prox RCA lesion, 100 %stenosed.  Ost LAD to Prox LAD lesion, 90  %stenosed.  Normally functioning mechanical aortic valve.   Unclear what the true etiology of this patient's symptoms is.  This could simply be related to accelerated hypertension with existing disease. The existing left main disease is essentially stable from prior to CABG, however the large codominant circumflex with at least 2 branches is somewhat unprotected by the vein graft to the OM.  Very difficult to catheter manipulation from radial access given the durations in the patient's anatomy following multiple surgeries.  Recommendations:  Return to TCU, start heparin 4 hours after sheath removal. - For ACS and mechanical valve.  Will review images additionally interventional colleagues (I did review with Dr. Angelena Form) - would consider returning to the catheterization lab tomorrow for femoral access. Would reassess the vein graft ostium to ensure  that there is no true ostial lesion.   Would also then consider protected left main stenting to provide in-line flow to the large essentially unprotected codominant circumflex and OM branches.          Assessment/Plan:   Principal Problem:   NSTEMI (non-ST elevated myocardial infarction) ([place]) Active Problems:   Coronary artery disease involving native coronary artery   S/P AVR, St Jude 2000   Chronic anticoagulation for mechanical AVR   Contrast media allergy   HTN (hypertension)   Hx of CABG  1. NSTEMI: troponin 0.21  trending downward. No recurrent chest pain; no dyspnea   --s/p PCI and stent to SVG to LAD with Y graft to OM 2. S/P CABG 2014 3. St Jude AVR: crisp valve click 4. Anticoagulation: last dose of warfarin was 9/17 at 4 pm.  INR yesterday afternoon was 1.62, today 1.5; now on heparin 5. 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 6. Ascending aortic aneurysm replacement 7. AAA stable on last ultrsound 8. Essential HTN  Underwent PCI of SVG-LAD-OM yesterday. Will  reload warfarin. Continue Plavix. Home when INR >= 1.6 with AVR then can drift up from there. Can go to tele.  Home eventually on ASA 81, Plavix 75 and warfarin. Continue b-blocker, statin. CR to see.   D/W Dr. Burt Knack.   Hetvi Shawhan,MD 11:47 AM

## 2016-06-24 NOTE — Progress Notes (Signed)
CARDIAC REHAB PHASE I   PRE:  Rate/Rhythm: 64 SR  BP:  Supine: 151/85 Sitting:   Standing:    SaO2: 100% RA  MODE:  Ambulation: 350 ft   POST:  Rate/Rhythm: 78 SR w/ PACs  BP:  Supine:   Sitting: 139/66  Standing:    SaO2: 100% RA  1191-4782 Patient tolerated ambulation well with assist x1. Pt c/o lightheadedness and headache midway through walk, standing rest break taken. To bed after walk, VSS. Symptoms resolved with rest. MI/PCI education complete including CP/NTG use, and calling 911, Plavix use, restrictions, risk factor modification, heart healthy eating and activity progression. Pt verbalizes understanding of instructions given. Discussed Phase 2 cardiac rehab, and pt is interested in participation in the program at [facility], will send referral.  Cristy Hilts

## 2016-06-24 NOTE — Progress Notes (Signed)
Arterial sheath removed at 2204 by two RNs. Pressure held for forty minutes and pressure dressing applied at 2245. Site a level 1 with bruising and small hematoma. Patient educated regarding holding pressure for coughing, sneezing, etc. Also, advised to contact nurse with any oozing or signs of bleeding. Will continue to monitor closely.

## 2016-06-24 NOTE — Progress Notes (Signed)
   ANTICOAGULATION CONSULT NOTE - Follow-Up Consult  Pharmacy Consult for Heparin/coumadin Indication: chest pain/ACS/St. Jude AVR  Allergies  Allergen Reactions  . Contrast Media [Iodinated Diagnostic Agents] Hives, Itching and Swelling    Patient Measurements: Height: 5\' 7"  (170.2 cm) Weight: 171 lb 1.2 oz (77.6 kg) IBW/kg (Calculated) : 61.6  Heparin dosing weight: 77 kg  Vital Signs: Temp: 97.5 F (36.4 C) (09/23 2005) Temp Source: Oral (09/23 2005) BP: 115/54 (09/23 2136) Pulse Rate: 62 (09/23 2136)  Labs:  Recent Labs  06/22/16 0242 06/22/16 1209 06/23/16 0045  06/23/16 1208 06/24/16 0440 06/24/16 1348 06/24/16 2123  HGB 12.0  --  11.9*  --   --  11.6*  --   --   HCT 38.2  --  36.9  --   --  36.2  --   --   PLT 170  --  180  --   --  210  --   --   LABPROT 23.1* 19.4* 18.2*  --   --  16.0*  --   --   INR 2.01 1.62 1.50  --   --  1.27  --   --   HEPARINUNFRC  --   --   --   < > 0.58  --  0.46 0.51  CREATININE 0.90  --   --   --  0.86 0.98  --   --   < > = values in this interval not displayed.  Estimated Creatinine Clearance: 63.1 mL/min (by C-G formula based on SCr of 0.98 mg/dL).    Assessment: 65 y.o. female on coumadin PTA for St. Jude AVR s/p cath, will resume heparin and coumadin on 9/22. INR on admit slightly below goal at 2.4.  INR this am remains SUBtherapeutic at 1.27, heparin level remains therapeutic at 0.51. CBC low but stable, no overt s/s bleeding reported.   PTA Coumadin dose 5 mg daily except 2.5 mg on Mon/Thurs. Last dose 9/19   Goal of Therapy:  INR 2.5-3.5 Heparin level 0.3-0.7 units/ml Monitor platelets by anticoagulation protocol: Yes   Plan:  Continue heparin gtt at 950 units/hr  Coumadin 7.5 mg po x 1 Daily PT/INR, heparin level and CBC  10/19, PharmD, BCPS Clinical Pharmacist Pager [phone] 06/24/2016 10:10 PM

## 2016-06-25 ENCOUNTER — Inpatient Hospital Stay (HOSPITAL_COMMUNITY): Payer: Commercial Managed Care - PPO

## 2016-06-25 ENCOUNTER — Encounter (HOSPITAL_COMMUNITY): Payer: Self-pay | Admitting: Student

## 2016-06-25 DIAGNOSIS — M79609 Pain in unspecified limb: Secondary | ICD-10-CM

## 2016-06-25 DIAGNOSIS — Z9889 Other specified postprocedural states: Secondary | ICD-10-CM

## 2016-06-25 LAB — CBC
HEMATOCRIT: 34 % — AB (ref 36.0–46.0)
HEMOGLOBIN: 11 g/dL — AB (ref 12.0–15.0)
MCH: 24.8 pg — AB (ref 26.0–34.0)
MCHC: 32.4 g/dL (ref 30.0–36.0)
MCV: 76.6 fL — AB (ref 78.0–100.0)
Platelets: 174 10*3/uL (ref 150–400)
RBC: 4.44 MIL/uL (ref 3.87–5.11)
RDW: 16.5 % — ABNORMAL HIGH (ref 11.5–15.5)
WBC: 9.3 10*3/uL (ref 4.0–10.5)

## 2016-06-25 LAB — HEPARIN LEVEL (UNFRACTIONATED): Heparin Unfractionated: 0.6 IU/mL (ref 0.30–0.70)

## 2016-06-25 LAB — PROTIME-INR
INR: 1.86
PROTHROMBIN TIME: 21.7 s — AB (ref 11.4–15.2)

## 2016-06-25 MED ORDER — ATORVASTATIN CALCIUM 80 MG PO TABS: 80.0000 mg | ORAL_TABLET | Freq: Every day | ORAL | 6 refills | Status: DC

## 2016-06-25 MED ORDER — WARFARIN SODIUM 5 MG PO TABS: 5.0000 mg | ORAL_TABLET | Freq: Once | ORAL | Status: DC

## 2016-06-25 MED ORDER — CLOPIDOGREL BISULFATE 75 MG PO TABS: 75.0000 mg | ORAL_TABLET | Freq: Every day | ORAL | 6 refills | Status: DC

## 2016-06-25 MED ORDER — NITROGLYCERIN 0.4 MG SL SUBL: 0.4000 mg | SUBLINGUAL_TABLET | SUBLINGUAL | 3 refills | Status: DC | PRN

## 2016-06-25 MED ORDER — ASPIRIN 81 MG PO TBEC: 81.0000 mg | DELAYED_RELEASE_TABLET | Freq: Every day | ORAL | Status: DC

## 2016-06-25 MED ORDER — METOPROLOL TARTRATE 25 MG PO TABS: 25.0000 mg | ORAL_TABLET | Freq: Two times a day (BID) | ORAL | 5 refills | Status: DC

## 2016-06-25 NOTE — Progress Notes (Signed)
Patient to D/C home with son. Education done. IV removed. Tele monitor removed. CCMD notified. Personal belongings given to patient. Patient to D/C home with son.  Valinda Hoar RN

## 2016-06-25 NOTE — Progress Notes (Signed)
*  PRELIMINARY RESULTS* Vascular Ultrasound Lower Extremity Arterial Duplex has been completed.  Preliminary findings: No evidence of pseudoaneurysm or hematoma in the right groin.   Farrel Demark, RDMS, RVT   06/25/2016, 2:20 PM

## 2016-06-25 NOTE — Progress Notes (Signed)
ANTICOAGULATION CONSULT NOTE - Follow-Up Consult  Pharmacy Consult for Heparin/coumadin Indication: chest pain/ACS/St. Jude AVR  Allergies  Allergen Reactions  . Contrast Media [Iodinated Diagnostic Agents] Hives, Itching and Swelling    Patient Measurements: Height: 5\' 7"  (170.2 cm) Weight: 176 lb 14.4 oz (80.2 kg) IBW/kg (Calculated) : 61.6  Heparin dosing weight: 77 kg  Vital Signs: Temp: 97.7 F (36.5 C) (09/24 0441) Temp Source: Oral (09/24 0441) BP: 127/40 (09/24 0935) Pulse Rate: 60 (09/24 0935)  Labs:  Recent Labs  06/23/16 0045  06/23/16 1208 06/24/16 0440 06/24/16 1348 06/24/16 2123 06/25/16 0329  HGB 11.9*  --   --  11.6*  --   --  11.0*  HCT 36.9  --   --  36.2  --   --  34.0*  PLT 180  --   --  210  --   --  174  LABPROT 18.2*  --   --  16.0*  --   --  21.7*  INR 1.50  --   --  1.27  --   --  1.86  HEPARINUNFRC  --   < > 0.58  --  0.46 0.51 0.60  CREATININE  --   --  0.86 0.98  --   --   --   < > = values in this interval not displayed.  Estimated Creatinine Clearance: 64 mL/min (by C-G formula based on SCr of 0.98 mg/dL).    Assessment: 65 y.o. female on coumadin PTA for St. Jude AVR s/p cath, will resume heparin and coumadin on 9/22. INR on admit slightly below goal at 2.4.  INR this am remains SUBtherapeutic but trending up 1.27>1.86 after two 7.5mg  doses, heparin level remains therapeutic at 0.6. CBC low but stable, no overt s/s bleeding reported. Per HF note, plan is to go home when INR>1.6 and allow to drift up from there.  PTA Coumadin dose 5 mg daily except 2.5 mg on Mon/Thurs. Last dose 9/19   Goal of Therapy:  INR 2.5-3.5 Heparin level 0.3-0.7 units/ml Monitor platelets by anticoagulation protocol: Yes   Plan:  Continue heparin gtt at 950 units/hr  Coumadin 5 mg po x 1 Daily INR, heparin level and CBC Monitor for s/sx bleeding  10/19, PharmD, [facility] Clinical Pharmacist Pager [phone] 06/25/2016 10:56 AM

## 2016-06-25 NOTE — Discharge Summary (Signed)
Discharge Summary    Patient ID: [name],  MRN: [id number], DOB/AGE: [birth year] 65 y.o.  Admit date: 06/20/2016 Discharge date: 06/25/2016  Primary Care Provider: [name] Primary Cardiologist: Previously Dr. Rennis Golden --> Wishes to follow-up with Dr. Excell Seltzer  Discharge Diagnoses    Principal Problem:   NSTEMI (non-ST elevated myocardial infarction) [facility]) Active Problems:   Coronary artery disease involving native coronary artery   S/P AVR, St Jude 2000   Chronic anticoagulation for mechanical AVR   Contrast media allergy   HTN (hypertension)   Hx of CABG   History of Present Illness     [name] is a 65 y.o. female with past medical history of CAD (s/p CABG w/ LIMA-LAD and SVG-Cfx in 2014), aortic insufficiency (s/p AVR in 2000 with ascending aorta replacement at that time), HTN, and HLD who presented to [facility] ED on 06/20/2016 for chest pain.   She reported having an aching substernal chest pain while at work. Reported associated dyspnea with exertion along with diaphoresis. Initial troponin was elevated to 0.26 and she was admitted for further observation.   Hospital Course     Consultants: None  Her troponin values peaked at 0.33. With her presenting symptoms and known CAD, a cardiac catheterization was recommended. INR would need to be less than 1.8 and Coumadin had been held since admission. Echo was performed and showed a reduced EF of 40-45% with moderate hypokinesis of the basalinferior myocardium along with Grade 2 DD.   INR was down to 1.62 on 9/21 and she underwent a cardiac catheterization which showed patent SVG-LAD and SVG-OM1. There was 95% stenosis of Ost LM, 90% Ost 1st Mrg, 100% Ostial RCA and 90% Ost LAD. Intervention was not performed due to difficult radial access and the need to review with interventional colleagues as there was concern the large codominant circumflex with at least 2 branches being somewhat unprotected by the vein graft  to the OM.  She was brought back to the cath lab on 9/22 and underwent successful stenting of the SVG-LAD ostium. The full report is included below. She will be on ASA, Plavix, and warfarin for 3 months, then long-term warfarin and plavix. She tolerated the procedure well and no complications were noted.   She denied any chest discomfort the following morning. Was restarted on Coumadin with the anticipation of being discharged once INR > 1.6. She ambulated over 350 ft with cardiac rehab without any symptoms.   On 9/24, her INR was improved to 1.86. Heparin was discontinued as she will be on triple therapy with ASA, Plavix, and Coumadin. She did report right groin pain, therefore a vascular US was obtained with no evidence of a pseudoaneurysm or hematoma along the right groin. She was last examined by Dr. Antoine Poche and deemed stable for discharge. She wishes to follow-up with Dr. Excell Seltzer. A staff message has been sent to the office to arrange for a 7-day TCM appointment. Has scheduled INR check on 06/30/2016. A work note was provided, as the patient lifts heavy objects with her job duties and wishes to be seen in the office prior to returning to work. She was discharged home in good condition.   _____________  Discharge Vitals Blood pressure (!) 104/35, pulse (!) 56, temperature 97.7 F (36.5 C), temperature source Oral, resp. rate 18, height 5\' 7"  (1.702 m), weight 176 lb 14.4 oz (80.2 kg), SpO2 100 %.  Filed Weights   06/19/16 2307 06/20/16 0831 06/25/16 0011  Weight:  169 lb (76.7 kg) 171 lb 1.2 oz (77.6 kg) 176 lb 14.4 oz (80.2 kg)    Labs & Radiologic Studies     CBC  Recent Labs  06/24/16 0440 06/25/16 0329  WBC 9.9 9.3  HGB 11.6* 11.0*  HCT 36.2 34.0*  MCV 77.5* 76.6*  PLT 210 174   Basic Metabolic Panel  Recent Labs  06/23/16 1208 06/24/16 0440  NA 141 139  K 4.1 4.0  CL 109 111  CO2 27 23  GLUCOSE 84 211*  BUN 10 18  CREATININE 0.86 0.98  CALCIUM 9.3 9.0   Liver  Function Tests  Recent Labs  06/23/16 1208  AST 26  ALT 20  ALKPHOS 80  BILITOT 1.1  PROT 6.0*  ALBUMIN 3.3*   No results for input(s): LIPASE, AMYLASE in the last 72 hours. Cardiac Enzymes No results for input(s): CKTOTAL, CKMB, CKMBINDEX, TROPONINI in the last 72 hours. BNP Invalid input(s): POCBNP D-Dimer No results for input(s): DDIMER in the last 72 hours. Hemoglobin A1C No results for input(s): HGBA1C in the last 72 hours. Fasting Lipid Panel No results for input(s): CHOL, HDL, LDLCALC, TRIG, CHOLHDL, LDLDIRECT in the last 72 hours. Thyroid Function Tests No results for input(s): TSH, T4TOTAL, T3FREE, THYROIDAB in the last 72 hours.  Invalid input(s): FREET3  Dg Chest 2 View  Result Date: 06/19/2016 CLINICAL DATA:  Initial evaluation for acute mid chest pain for 2 weeks. EXAM: CHEST  2 VIEW COMPARISON:  Prior radiograph from 01/15/2013. FINDINGS: Median sternotomy wires underlying CABG markers and surgical clips noted, stable. Cardiomegaly unchanged. Mediastinal silhouette stable. Atheromatous plaque present within the aortic arch. Elevation of the left hemidiaphragm with associated left basilar atelectasis. No other focal airspace disease. No pulmonary edema or pleural effusion. No pneumothorax. No acute osseous abnormality. Surgical clips overlie the left lung apex and right axillary region. IMPRESSION: 1. Elevation left hemidiaphragm with associated mild left basilar atelectasis. 2. No other active cardiopulmonary disease. 3. Aortic atherosclerosis. Electronically Signed   By: Rise Mu M.D.   On: 06/19/2016 23:26     Diagnostic Studies/Procedures     Cardiac Catheterization: 06/22/2016  Y Graft: SVG-LAD & SVG-OM1 large and anatomically normal - however, in some imaging as there appears to be a possible ostial lesion as significant as 45 or 70%.  Ost LM to LM lesion, 95 %stenosed.  Ost 1st Mrg lesion, 90 %stenosed. There is minimal retrograde flow from  the vein graft to the small branch with a relatively large somewhat unprotected circumflex and at least 2 more OM branches.  Ost RCA to Prox RCA lesion, 100 %stenosed.  Ost LAD to Prox LAD lesion, 90 %stenosed.  Normally functioning mechanical aortic valve.   Unclear what the true etiology of this patient's symptoms is.  This could simply be related to accelerated hypertension with existing disease. The existing left main disease is essentially stable from prior to CABG, however the large codominant circumflex with at least 2 branches is somewhat unprotected by the vein graft to the OM.  Very difficult to catheter manipulation from radial access given the durations in the patient's anatomy following multiple surgeries.  Recommendations:  Return to TCU, start heparin 4 hours after sheath removal. - For ACS and mechanical valve.  Will review images additionally interventional colleagues (I did review with Dr. Clifton Demani Weyrauch) - would consider returning to the catheterization lab tomorrow for femoral access. Would reassess the vein graft ostium to ensure  that there is no true ostial lesion.  Would also then consider protected left main stenting to provide in-line flow to the large essentially unprotected codominant circumflex and OM branches.    Coronary Stent Intervention: 06/23/2016 Successful stenting of the SVG-LAD ostium as outlined above. Complex case with true aorto-ostial disease.  Recommend ASA, Plavix, and warfarin x 3 months, then long-term warfarin and plavix. Will resume heparin and warfarin tonight, but with a mechanical aortic valve her INR could drift up.   Could never document post-PCI result but on aortic root angiography there is normal flow down the graft and the patient is pain-free.    Echocardiogram: 06/21/2016 Study Conclusions  - Left ventricle: The cavity size was normal. Wall thickness was   normal. Systolic function was mildly to moderately reduced. The    estimated ejection fraction was in the range of 40% to 45%.   Moderate hypokinesis of the basalinferior myocardium. Features   are consistent with a pseudonormal left ventricular filling   pattern, with concomitant abnormal relaxation and increased   filling pressure (grade 2 diastolic dysfunction). - Aortic valve: A mechanical prosthesis was present. Valve area   (VTI): 1.45 cm^2. Valve area (Vmax): 1.34 cm^2. Valve area   (Vmean): 1.4 cm^2. - Left atrium: The atrium was mildly dilated. - Tricuspid valve: There was mild-moderate regurgitation. _____________    Disposition   Pt is being discharged home today in good condition.  Follow-up Plans & Appointments    Follow-up Information    Tonny Bollman, MD .   Specialty:  Cardiology Why:  The office will call you to arrange hospital follow-up. If you do not hear from them in 3 business days, please call the number provided. Contact information: [address] [place] [address] [phone]          Discharge Instructions    Amb Referral to Cardiac Rehabilitation    Complete by:  As directed    Diagnosis:   Coronary Stents NSTEMI     Diet - low sodium heart healthy    Complete by:  As directed    Discharge instructions    Complete by:  As directed    PLEASE REMEMBER TO BRING ALL OF YOUR MEDICATIONS TO EACH OF YOUR FOLLOW-UP OFFICE VISITS.  PLEASE ATTEND ALL SCHEDULED FOLLOW-UP APPOINTMENTS.   Activity: Increase activity slowly as tolerated. You may shower, but no soaking baths (or swimming) for 1 week. No driving for 24 hours. No lifting over 5 lbs for 1 week. No sexual activity for 1 week.   You May Return to Work: in 1 week (if applicable)  Wound Care: You may wash cath site gently with soap and water. Keep cath site clean and dry. If you notice pain, swelling, bleeding or pus at your cath site, please call [phone].   Increase activity slowly    Complete by:  As directed        Discharge Medications     Medication List    STOP taking these medications   omeprazole 20 MG capsule Commonly known as:  PRILOSEC   pravastatin 80 MG tablet Commonly known as:  PRAVACHOL     TAKE these medications   ARTIFICIAL TEARS 1.4 % ophthalmic solution Generic drug:  polyvinyl alcohol Place 1-2 drops into both eyes daily as needed for dry eyes.   aspirin 81 MG EC tablet Take 1 tablet (81 mg total) by mouth daily. Start taking on:  06/26/2016   atorvastatin 80 MG tablet Commonly known as:  LIPITOR Take 1 tablet (80  mg total) by mouth daily at 6 PM.   clopidogrel 75 MG tablet Commonly known as:  PLAVIX Take 1 tablet (75 mg total) by mouth daily with breakfast. Start taking on:  06/26/2016   metoprolol tartrate 25 MG tablet Commonly known as:  LOPRESSOR Take 1 tablet (25 mg total) by mouth 2 (two) times daily. What changed:  how much to take   multivitamin with minerals Tabs tablet Take 1 tablet by mouth daily.   nitroGLYCERIN 0.4 MG SL tablet Commonly known as:  NITROSTAT Place 1 tablet (0.4 mg total) under the tongue every 5 (five) minutes as needed for chest pain.   warfarin 5 MG tablet Commonly known as:  COUMADIN TAKE 1 TO 1 AND 1/2 TABLET BY MOUTH EVERY DAY AS DIRECTED BY COUMADIN CLINIC What changed:  See the new instructions.       Aspirin prescribed at discharge?  Yes High Intensity Statin Prescribed? (Lipitor 40-80mg  or Crestor 20-40mg ): Yes Beta Blocker Prescribed? Yes For EF 40% or less, Was ACEI/ARB Prescribed? No: EF > 40% ADP Receptor Inhibitor Prescribed? (i.e. Plavix etc.-Includes Medically Managed Patients): Yes For EF <40%, Aldosterone Inhibitor Prescribed? No: EF > 40% Was EF assessed during THIS hospitalization? Yes Was Cardiac Rehab II ordered? (Included Medically managed Patients): Yes   Allergies Allergies  Allergen Reactions  . Contrast Media [Iodinated Diagnostic Agents] Hives, Itching and Swelling     Outstanding  Labs/Studies   BMET at Follow-up Appointment; INR Check on 9/29.  Duration of Discharge Encounter   Greater than 30 minutes including physician time.  Signed, Ellsworth LennoxBrittany M Strader, PA-C 06/25/2016, 5:09 PM

## 2016-06-25 NOTE — Progress Notes (Signed)
Patient Name: [name] Date of Encounter: 06/25/2016  Hospital Problem List     Principal Problem:   NSTEMI (non-ST elevated myocardial infarction) [facility]) Active Problems:   Coronary artery disease involving native coronary artery   S/P AVR, St Jude 2000   Chronic anticoagulation for mechanical AVR   Contrast media allergy   HTN (hypertension)   Hx of CABG    Patient Profile     ACS in patient with SVG LAD and SVG to Cfx 2014. Had AVR 2000 with mechanical valve. Had [place] followed by Ascending aortic replacement at time of CABG. Also had LCC to left subclavian bypass.   Presented with NSTEMI   Subjective   No chest pain.  However, she has had right groin pain since ambulating yesterday.    Inpatient Medications    . aspirin EC  81 mg Oral Daily  . atorvastatin  80 mg Oral q1800  . clopidogrel  75 mg Oral Q breakfast  . metoprolol tartrate  25 mg Oral BID  . sodium chloride flush  3 mL Intravenous Q12H  . warfarin  5 mg Oral ONCE-1800  . Warfarin - Pharmacist Dosing Inpatient   Does not apply q1800    Vital Signs    Vitals:   06/25/16 0011 06/25/16 0441 06/25/16 0935 06/25/16 1252  BP: (!) 137/51 (!) 136/46 (!) 127/40 (!) 104/35  Pulse: (!) 52 (!) 51 60 (!) 56  Resp: 18 17  18   Temp: 97.6 F (36.4 C) 97.7 F (36.5 C)  97.7 F (36.5 C)  TempSrc: Oral Oral  Oral  SpO2: 100% 99%  100%  Weight: 176 lb 14.4 oz (80.2 kg)     Height: 5\' 7"  (1.702 m)       Intake/Output Summary (Last 24 hours) at 06/25/16 1256 Last data filed at 06/24/16 2300  Gross per 24 hour  Intake            834.5 ml  Output              875 ml  Net            -40.5 ml   Filed Weights   06/19/16 2307 06/20/16 0831 06/25/16 0011  Weight: 169 lb (76.7 kg) 171 lb 1.2 oz (77.6 kg) 176 lb 14.4 oz (80.2 kg)    Physical Exam    GEN: Well nourished, well developed, in  no acute distress.  Neck: Supple, no JVD, carotid bruits, or masses. Cardiac: RRR,  rubs, or gallops. No clubbing,  cyanosis,  edema.  Radials/DP/PT 2+ and equal bilaterally.   Hematoma 6 x 4 cm with tenderness right groin.  Respiratory:  Respirations  regular and unlabored, clear to auscultation bilaterally. GI: Soft, nontender, nondistended, BS + x 4. Neuro:  Strength and sensation are intact.   Labs    CBC  Recent Labs  06/24/16 0440 06/25/16 0329  WBC 9.9 9.3  HGB 11.6* 11.0*  HCT 36.2 34.0*  MCV 77.5* 76.6*  PLT 210 174   Basic Metabolic Panel  Recent Labs  06/23/16 1208 06/24/16 0440  NA 141 139  K 4.1 4.0  CL 109 111  CO2 27 23  GLUCOSE 84 211*  BUN 10 18  CREATININE 0.86 0.98  CALCIUM 9.3 9.0   Liver Function Tests  Recent Labs  06/23/16 1208  AST 26  ALT 20  ALKPHOS 80  BILITOT 1.1  PROT 6.0*  ALBUMIN 3.3*   No results for input(s): LIPASE, AMYLASE in the last  72 hours. Cardiac Enzymes No results for input(s): CKTOTAL, CKMB, CKMBINDEX, TROPONINI in the last 72 hours. BNP Invalid input(s): POCBNP D-Dimer No results for input(s): DDIMER in the last 72 hours. Hemoglobin A1C No results for input(s): HGBA1C in the last 72 hours. Fasting Lipid Panel No results for input(s): CHOL, HDL, LDLCALC, TRIG, CHOLHDL, LDLDIRECT in the last 72 hours. Thyroid Function Tests No results for input(s): TSH, T4TOTAL, T3FREE, THYROIDAB in the last 72 hours.  Invalid input(s): FREET3    ECG    NA  Radiology    Dg Chest 2 View  Result Date: 06/19/2016 CLINICAL DATA:  Initial evaluation for acute mid chest pain for 2 weeks. EXAM: CHEST  2 VIEW COMPARISON:  Prior radiograph from 01/15/2013. FINDINGS: Median sternotomy wires underlying CABG markers and surgical clips noted, stable. Cardiomegaly unchanged. Mediastinal silhouette stable. Atheromatous plaque present within the aortic arch. Elevation of the left hemidiaphragm with associated left basilar atelectasis. No other focal airspace disease. No pulmonary edema or pleural effusion. No pneumothorax. No acute osseous  abnormality. Surgical clips overlie the left lung apex and right axillary region. IMPRESSION: 1. Elevation left hemidiaphragm with associated mild left basilar atelectasis. 2. No other active cardiopulmonary disease. 3. Aortic atherosclerosis. Electronically Signed   By: Rise Mu M.D.   On: 06/19/2016 23:26    Assessment & Plan    NSTEMI:  PCI of SVG.    Groin pain.  I will check an ultrasound.  OK to go home if groin ultrasound is OK.    AVR:  INR is 1.86.   As above.  Possibly home today.   I will stop the heparin as her INR is drifting up and she is already on ASA and Plavix.      Signed, Rollene Rotunda, MD  06/25/2016, 12:56 PM

## 2016-06-26 ENCOUNTER — Encounter (HOSPITAL_COMMUNITY): Payer: Self-pay | Admitting: Cardiovascular Disease

## 2016-06-28 ENCOUNTER — Telehealth: Payer: Self-pay | Admitting: Internal Medicine

## 2016-06-28 NOTE — Telephone Encounter (Signed)
Called patient. She wants to follow up with Dr. Rennis Golden following her stent, but could not make the appt for Oct 18th. Requested sooner follow up if possible. Advised patient Dr. Rennis Golden had opening on schedule 8am Oct 2nd - she voiced she could make this appt. Confirmed and made appt for patient and she voiced understanding and thanks.

## 2016-06-28 NOTE — Telephone Encounter (Signed)
She is a pt of Dr Hilty,Dr Excell Seltzer put her stent in. Who does she need a f/u appointment with?

## 2016-06-30 ENCOUNTER — Ambulatory Visit (INDEPENDENT_AMBULATORY_CARE_PROVIDER_SITE_OTHER): Payer: Commercial Managed Care - PPO | Admitting: Pharmacist Clinician (PhC)/ Clinical Pharmacy Specialist

## 2016-06-30 DIAGNOSIS — Z7901 Long term (current) use of anticoagulants: Secondary | ICD-10-CM

## 2016-06-30 DIAGNOSIS — Z954 Presence of other heart-valve replacement: Secondary | ICD-10-CM | POA: Diagnosis not present

## 2016-06-30 DIAGNOSIS — Z952 Presence of prosthetic heart valve: Secondary | ICD-10-CM

## 2016-06-30 LAB — POCT INR: INR: 3

## 2016-07-03 ENCOUNTER — Telehealth: Payer: Self-pay

## 2016-07-03 ENCOUNTER — Encounter: Payer: Self-pay | Admitting: Internal Medicine

## 2016-07-03 ENCOUNTER — Ambulatory Visit (INDEPENDENT_AMBULATORY_CARE_PROVIDER_SITE_OTHER): Payer: Commercial Managed Care - PPO | Admitting: Internal Medicine

## 2016-07-03 VITALS — BP 122/84 | HR 63 | Ht 67.0 in | Wt 174.0 lb

## 2016-07-03 DIAGNOSIS — I255 Ischemic cardiomyopathy: Secondary | ICD-10-CM | POA: Diagnosis not present

## 2016-07-03 DIAGNOSIS — I10 Essential (primary) hypertension: Secondary | ICD-10-CM

## 2016-07-03 DIAGNOSIS — Z7901 Long term (current) use of anticoagulants: Secondary | ICD-10-CM

## 2016-07-03 DIAGNOSIS — Z952 Presence of prosthetic heart valve: Secondary | ICD-10-CM

## 2016-07-03 DIAGNOSIS — I359 Nonrheumatic aortic valve disorder, unspecified: Secondary | ICD-10-CM | POA: Diagnosis not present

## 2016-07-03 NOTE — Telephone Encounter (Signed)
Pateint was seen in the office today by Dr Rennis Golden. She left a form that needed to be completed but needs the patients signature. Left v/m requesting patient come to the office and sign the form so I can fax it to insurance company. Patient will recieve original back and copy will be placed in chart. I have the form.

## 2016-07-03 NOTE — Progress Notes (Signed)
OFFICE NOTE  Chief Complaint:  Follow-up hospitalization  Primary Care Physician: Mariah Clan, MD  HPI:  [name] is a very pleasant 65 year old African American female with a history of St. Jude AVR placed in 2000 for aortic insufficiency. 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 She also has a history of sarcoidosis, dyslipidemia and contrast allergy, which apparently gave her hives. In April 2013, she saw Dr. Clarene [name] with complaints of chest pain, for which he then did a Myoview on her, which was normal. She was last seen by [name] on October 21, 2012. At that time she was complaining of having chest pressure and shortness of breath. She was set up for a diagnostic heart catheterization. This was completed by Dr. Rennis [name], which revealed severe ostial left main stenosis greater than 95%. There was also suggestion of possible ostial RCA disease. The ascending aorta was dilated up to 4.8 cm. At that time, Dr. Donata [name] was asked to consult. Patient was then taken for bypass surgery x2 with a saphenous vein graft to the left anterior descending coronary artery and a saphenous vein graft to the circumflex marginal. 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 Also during that hospitalization, she had development of a pericardial hematoma. Dr. Donata [name] then did a subxiphoid pericardial exposure and evacuation of the pericardial hematoma. She was seen in follow-up in March 2014 and was doing well.  She had lost about 17 lbs during her hospitalization.  She reports returning to work this past summer and is still able to do most activities. Her INR has been followed in our Coumadin clinic and is actually supratherapeutic today at 4. Her dose will be adjusted. She's not had any further laboratory work or followup since this past Spring.  She denies any chest pain with exertion, but does report some discomfort associated with  her anterior scars as well as her sternal wires. She does occasionally get fatigued with work but this improves with rest.  I saw [name] back today in follow-up. She is doing fairly well. She denies any shortness of breath or chest pain. She is annoyed somewhat at her mechanical valve which she says is very loud. She underwent a CT scan this year in March which shows a stable thoracic aneurysm repair at 4.0 cm. 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 In the past she's had left subclavian bypass and will need ultrasound follow-up of that. She's also not had any lab work in the past several months.  [name] returns today for follow-up. Overall she seems to be doing pretty well. She says she gets hot and noted that her blood pressure is somewhat elevated from time to time now. It seems as if she's not currently on any blood pressure medicines. Last time I saw her she was not taking lisinopril and her blood pressure was fairly normal therefore I kept her off the medicine. Previous to that she was taken off of her beta blocker. She really has indication for either one of these are both based on her history of coronary disease, coronary artery bypass grafting, valve repair, and aneurysm. Blood pressure is elevated today. She is due for an INR check. Dopplers including AAA screen as well as ultrasound of her subclavian stenosis were performed in October last year and are stable.  07/03/2016  [name] returns today for follow-up.  Unfortunately she recently had some unstable angina and presented to the hospital with chest pain. She underwent cardiac catheterization after being found to have a NSTEMI with elevated troponin of 0.26. Warfarin was held and she was found to have an EF of 40-45% with moderate hypokinesis of the basal inferior myocardium and grade 2 diastolic dysfunction. Her mechanical heart valve was functioning normally. She then underwent  cardiac catheterization which demonstrated a patent SVG to LAD and SVG to OM1. There was 95% stenosis of an ostial left main, 90% ostial first marginal, her percent ostial RCA and 90% ostial LAD disease. Given the difficulty of the case, intervention was deferred until the next day when Dr. Excell Seltzer performed PCI to the ostium of the SVG to LAD. Selective angiography was not possible after the case due to the stent strut hanging out into the aorta however aortic root angiography showed normal flow down the graft and the patient was noted to be chest pain-free. He recommended aspirin, Plavix and warfarin for 3 months, then discontinuing aspirin and continuing warfarin and Plavix. Today she reports no further chest pain. She initially talked to me about disability however after more discussion she understood that if she were to become formally disabled she could never work again and she did want to continue to work part time in addition to having disability. Based on that she wishes to go back to work and she should be able to go back to work as early as October 9.  PMHx:  Past Medical History:  Diagnosis Date  . Anginal pain (HCC)   . Coronary artery disease    mech AVR, CABG  . Hyperlipidemia   . Hypertension   . RA (rheumatoid arthritis) (HCC)   . S/P AVR (aortic valve replacement) 08/1999   St. Jude AVR for aortic insuff  . 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 Donata [name]) - complicated by pericardial hematoma --> subxiphoid pericardial exposure and evacuation b. Cath 06/2016: DES to SVG-LAD ostium  . Sarcoid (HCC)   . Shortness of breath   . 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 AORTIC VALVE REPLACEMENT  08/23/1999   61mm St. Jude, Bentall procedure, root replacemtn   . CARDIAC CATHETERIZATION  08/1999   normal L main, LAD &  first diagonal are normal, normal Cfx, RCA with mild ostial narrowing, normal LV systolic function, severe AI, mild MR, normal PAP (Dr. Mervyn Skeeters. Little)   . CARDIAC CATHETERIZATION N/A 06/22/2016   Procedure: Left Heart Cath and Cors/Grafts Angiography;  Surgeon: Marykay Lex, MD;  Location: [facility] INVASIVE CV LAB;  Service: Cardiovascular;  Laterality: N/A;  . CARDIAC CATHETERIZATION N/A 06/23/2016   Procedure: Left Heart Cath and Coronary Angiography;  Surgeon: Tonny Bollman, MD;  Location: [facility] INVASIVE CV LAB;  Service: Cardiovascular;  Laterality: N/A;  . CARDIAC CATHETERIZATION N/A 06/23/2016   Procedure: Coronary Stent Intervention;  Surgeon: Tonny Bollman, MD;  Location: [facility] INVASIVE CV LAB;  Service: Cardiovascular;  Laterality: N/A;  . CORONARY ARTERY BYPASS GRAFT  11/05/2012   Procedure: CORONARY ARTERY BYPASS GRAFTING (CABG);  Surgeon: Kerin Perna, MD;  Location: [facility] OR;  Service: Open Heart Surgery;  Laterality: N/A;  cannulate right subclavian  . ENDOVEIN HARVEST OF GREATER SAPHENOUS VEIN  11/05/2012   Procedure: ENDOVEIN HARVEST OF GREATER SAPHENOUS VEIN;  Surgeon: Kerin Perna, MD;  Location: [facility] OR;  Service: Open Heart Surgery;  Laterality: Right;  . INTRAOPERATIVE TRANSESOPHAGEAL ECHOCARDIOGRAM  11/05/2012   Procedure: INTRAOPERATIVE TRANSESOPHAGEAL ECHOCARDIOGRAM;  Surgeon: Kerin Perna, MD;  Location: [facility] OR;  Service: Open Heart Surgery;  Laterality: N/A;  . INTRAOPERATIVE TRANSESOPHAGEAL ECHOCARDIOGRAM N/A 11/21/2012   Procedure: INTRAOPERATIVE TRANSESOPHAGEAL ECHOCARDIOGRAM;  Surgeon: Kerin Perna, MD;  Location: [facility] OR;  Service: Open Heart Surgery;  Laterality: N/A;  . LEFT HEART CATHETERIZATION WITH CORONARY ANGIOGRAM N/A 11/04/2012   Procedure: LEFT HEART CATHETERIZATION WITH CORONARY ANGIOGRAM;  Surgeon: Chrystie Nose, MD;  Location: [facility] CATH LAB;  Service: Cardiovascular;  Laterality: N/A;  . MEDIASTINOSCOPY  08/2005   bronchoscopy & mediastinoscopy for mediastinal adenopathy  (Dr. Donata [name])   . NM MYOCAR PERF WALL MOTION  01/2012   lexiscan; small, fixed apical septal breast attenuation artifact & inferolateral bowel attenuation, no reversible ischemia, post-stress EF 64%, low risk scan   . PERICARDIAL WINDOW N/A 11/21/2012   Procedure: PERICARDIAL WINDOW;  Surgeon: Kerin Perna, MD;  Location: [facility] OR;  Service: Thoracic;  Laterality: N/A;  . REPLACEMENT ASCENDING AORTA  11/05/2012   Procedure: REPLACEMENT ASCENDING AORTA;  Surgeon: Kerin Perna, MD;  Location: [facility] OR;  Service: Open Heart Surgery;  Laterality: N/A;  . SUBCLAVIAN ANGIOGRAM  05/10/2000   left subclavian arteriogram; patent graft, mild stenosis (Dr. Amada Kingfisher)   . SUBCLAVIAN BYPASS GRAFT  05/16/1999   left subclavian bypass with 68mm Darcon graft (Dr. Amada Kingfisher)  . TRANSTHORACIC ECHOCARDIOGRAM  11/20/2012   EF 65-70%, mild conc LVH, grade 2 diastolic dysfunction, mech AV, mild MR    FAMHx:  Family History  Problem Relation Age of Onset  . Hypertension Father     SOCHx:   reports that she has never smoked. She has never used smokeless tobacco. She reports that she does not drink alcohol or use drugs.  ALLERGIES:  Allergies  Allergen Reactions  . Contrast Media [Iodinated Diagnostic Agents] Hives, Itching and Swelling    ROS: Pertinent items noted in HPI and remainder of comprehensive ROS otherwise negative.  HOME MEDS: Current Outpatient Prescriptions  Medication Sig Dispense Refill  . aspirin EC 81 MG EC tablet Take 1 tablet (81 mg total) by mouth daily.    Marland Kitchen atorvastatin (LIPITOR) 80 MG tablet Take 1 tablet (80 mg total) by mouth daily at 6 PM. 30 tablet 6  . clopidogrel (PLAVIX) 75 MG tablet Take 1 tablet (75 mg total) by mouth daily with breakfast. 30 tablet 6  . metoprolol tartrate (LOPRESSOR) 25 MG tablet Take 12.5 mg by mouth 2 (two) times daily.    . Multiple Vitamin (MULTIVITAMIN WITH MINERALS) TABS tablet Take 1 tablet by mouth daily.    . nitroGLYCERIN (NITROSTAT) 0.4 MG SL  tablet Place 1 tablet (0.4 mg total) under the tongue every 5 (five) minutes as needed for chest pain. 25 tablet 3  . polyvinyl alcohol (ARTIFICIAL TEARS) 1.4 % ophthalmic solution Place 1-2 drops into both eyes daily as needed for dry eyes.    Marland Kitchen warfarin (COUMADIN) 5 MG tablet TAKE 1 TO 1 AND 1/2 TABLET BY MOUTH EVERY DAY AS DIRECTED BY COUMADIN CLINIC (Patient taking differently: Take 0.5(2.5mg ) tablet on Monday and Thursday, all other days 1(5mg )  tablet) 105 tablet 1   No current facility-administered medications for this visit.     LABS/IMAGING: No results found for this or any previous visit (from the past 48 hour(s)). No results found.  VITALS: BP 122/84   Pulse 63   Ht 5\' 7"  (1.702 m)   Wt 174 lb (78.9 kg)   BMI 27.25 kg/m   EXAM: General appearance: alert and no distress Neck: no carotid bruit and no JVD Lungs: clear to auscultation bilaterally Heart: regular rate and rhythm and sharp mechanical valve sounds Abdomen: soft, non-tender; bowel sounds normal; no masses,  no organomegaly Extremities: extremities normal, atraumatic, no cyanosis or edema Pulses: 2+ and symmetric Skin: Skin color, texture, turgor normal. No rashes or lesions Neurologic: Grossly normal Psych: Pleasant, normal  EKG: Sinus rhythm at 63 with a PAC  ASSESSMENT: 1. Coronary artery disease status post two-vessel CABG in 11/2012 (LIMA to LAD, SVG to circumflex). 2. Recent NSTEMI - s/p PCI with DES to Ostial SVG-LAD stenosis (06/2016) 3. Ischemic cardiopathy EF 40-45% with inferior hypokinesis 4. 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 Status post pericardial window with hematoma evacuation 6. History of left subclavian stenosis status post bypass in 2001 7. Rheumatoid arthritis 8. Sarcoidosis 9. Dyslipidemia 10. Hypertension  PLAN: 1.   Mrs. Loewe had recent intervention to the ostial SVG to LAD stenosis with a drug-eluting stent. She will require  triple therapy with aspirin, Plavix and warfarin (due to her mechanical aVR) for 3 months, then it is recommended she be on Plavix and warfarin going forward by Dr. 2002. LVEF was 40-45% by echo and we will reassess this after successful PCI in 6 months prior to her returning. Cholesterol was well controlled in the hospital with LDL in the 60s. Blood pressure today is at goal 122/84. She may return to work without restrictions on July 10, 2016. Due to work restriction she plans to do home exercise and rehabilitation.  Follow-up with me 6 months.  July 12, 2016, MD, [facility] Attending Cardiologist [facility]  [facility] 07/03/2016, 8:55 AM

## 2016-07-03 NOTE — Patient Instructions (Addendum)
Medication Instructions:  STOP Aspirin 81mg  at the first of the year(October 02, 2016)  Labwork: None   Testing/Procedures: Your physician has requested that you have an echocardiogram. Echocardiography is a painless test that uses sound waves to create images of your heart. It provides your doctor with information about the size and shape of your heart and how well your heart's chambers and valves are working. This procedure takes approximately one hour. There are no restrictions for this procedure.  COMPLETE IN 6 MONTHS  Follow-Up: Your physician wants you to follow-up in: 6 MONTHS with DR HILTY. You will receive a reminder letter in the mail two months in advance. If you don't receive a letter, please call our office to schedule the follow-up appointment.   Any Other Special Instructions Will Be Listed Below (If Applicable).  OUR FAX NUMBER IS [phone]   If you need a refill on your cardiac medications before your next appointment, please call your pharmacy.

## 2016-07-13 ENCOUNTER — Telehealth: Payer: Self-pay | Admitting: Internal Medicine

## 2016-07-13 NOTE — Telephone Encounter (Signed)
LMTCB

## 2016-07-13 NOTE — Telephone Encounter (Signed)
Returning your call. °

## 2016-07-13 NOTE — Telephone Encounter (Signed)
Pt called regarding bruises popping up on arms and legs, on blood thinner, pt wants to go back on reflux med, stomach bothering her  pls advice [phone]

## 2016-07-14 ENCOUNTER — Encounter: Payer: Self-pay | Admitting: Physician Assistant

## 2016-07-14 ENCOUNTER — Ambulatory Visit (INDEPENDENT_AMBULATORY_CARE_PROVIDER_SITE_OTHER): Payer: Commercial Managed Care - PPO | Admitting: Physician Assistant

## 2016-07-14 VITALS — BP 118/56 | HR 78 | Ht 67.0 in | Wt 173.0 lb

## 2016-07-14 DIAGNOSIS — E785 Hyperlipidemia, unspecified: Secondary | ICD-10-CM | POA: Diagnosis not present

## 2016-07-14 DIAGNOSIS — Z952 Presence of prosthetic heart valve: Secondary | ICD-10-CM | POA: Diagnosis not present

## 2016-07-14 DIAGNOSIS — I25708 Atherosclerosis of coronary artery bypass graft(s), unspecified, with other forms of angina pectoris: Secondary | ICD-10-CM

## 2016-07-14 DIAGNOSIS — R079 Chest pain, unspecified: Secondary | ICD-10-CM | POA: Diagnosis not present

## 2016-07-14 MED ORDER — ISOSORBIDE MONONITRATE ER 30 MG PO TB24: 15.0000 mg | ORAL_TABLET | Freq: Every day | ORAL | 9 refills | Status: DC

## 2016-07-14 MED ORDER — PANTOPRAZOLE SODIUM 40 MG PO TBEC: 40.0000 mg | DELAYED_RELEASE_TABLET | Freq: Every day | ORAL | 11 refills | Status: DC

## 2016-07-14 NOTE — Progress Notes (Signed)
Cardiology Office Note    Date:  07/14/2016   ID:  [name], DOB [birth year], MRN [id number]  PCP:  [name], MD  Cardiologist:  Dr. Rennis Golden  Chief Complaint  Patient presents with  . Follow-up    seen for Dr. Rennis Golden  . Chest Pain    pt c/o chest pain for 1 week, with heaviness in chest and triednessv    History of Present Illness:  [name] is a 65 y.o. 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 She has a contrast allergy which results in hives. In April 2013, she complained about chest pain, she underwent Myoview which was normal. She was seen again in January 2014, complaining of chest discomfort or shortness breath. A diagnostic cardiac catheterization was performed which revealed severe ostial left main stenosis of greater than 95%. There is also suggestion of the ascending aorta was dilated up to 4.8 cm. She was seen by Dr. Donata Clay and underwent CABG 2 with SVG to LAD and SVG to circumflex marginal. 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 During that hospitalization, she developed her cardial hematoma, she then underwent a subxiphoid pericardial exposure and evacuation of pericardial hematoma.  She was last seen in the office on 07/03/2016 by Dr. Rennis Golden, at which time she was doing well. She had CT scan this year in March 2017 showed stable thoracic aneurysm repair at 4 cm. 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 She was admitted in September 2017 for non-ST elevated MI with troponin of 0.26. Coumadin was held, echo showed EF 40-45% with moderate hypokinesis of basal inferior myocardium with grade 2 diastolic dysfunction, her mechanical heart valve was functioning normally. She underwent cardiac catheterization on 06/22/2016 which demonstrated patent SVG to LAD and the OM1, 95% stenosis of ostial  left main, 90% ostial first marginal, 100% ostial to proximal RCA occlusion, 90% ostial to proximal LAD lesion. No culprit lesion was identified, her elevated troponin was felt to be related to accelerated hypertension. The existing left main disease is stable from prior CABG, however the large codominant left circumflex with at least 2 branches is somewhat unprotected from the vein graft to the obtuse marginal. She returned to the cath lab on 06/21/2016 and underwent successful stenting of SVG to LAD ostium. She was later placed on aspirin, Plavix and Coumadin with plan to drop ASA after 3 months and continue Coumadin and Plavix afterward.  She was last seen by Dr. Rennis Golden on 07/03/2016, at which time she was doing well, for the past week, however she has been noticing worsening energy. She also noticed exertional type of burning sensation that is relieved by rest. Interestingly enough, this is actually a different type of chest pain and the one that brought her to the hospital. She says when she was in the hospital, she had a sharp stabbing pain prior to recent stent. However, she still have the unprotected left circumflex territory that can potentially cause exertional symptom. After discussing various options with her, we plan to obtain an outpatient stress test to look for ischemia in the left circumflex territory, if there is significant ischemia, I will discuss with Dr. Rennis Golden to see if the patient needed another cardiac catheterization. During the meantime, I will start her on low-dose Imdur 15 mg daily and  Protonix 40 mg daily. She is still on triple therapy at this point.   Past Medical History:  Diagnosis Date  . Anginal pain (HCC)   . Coronary artery disease    mech AVR, CABG  . Hyperlipidemia   . Hypertension   . RA (rheumatoid arthritis) (HCC)   . S/P AVR (aortic valve replacement) 08/1999   St. Jude AVR for aortic insuff  . 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 SVG to LAD, SVG to Cfx; re-do replacement  of fusiform aneursym of ascending thoracic aorta (Dr. Donata Clay) - complicated by pericardial hematoma --> subxiphoid pericardial exposure and evacuation b. Cath 06/2016: DES to SVG-LAD ostium  . Sarcoid (HCC)   . Shortness of breath   . 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 AORTIC VALVE REPLACEMENT  08/23/1999   35mm St. Jude, Bentall procedure, root replacemtn   . CARDIAC CATHETERIZATION  08/1999   normal L main, LAD & first diagonal are normal, normal Cfx, RCA with mild ostial narrowing, normal LV systolic function, severe AI, mild MR, normal PAP (Dr. Mervyn Skeeters. Little)   . CARDIAC CATHETERIZATION N/A 06/22/2016   Procedure: Left Heart Cath and Cors/Grafts Angiography;  Surgeon: Marykay Lex, MD;  Location: [facility] INVASIVE CV LAB;  Service: Cardiovascular;  Laterality: N/A;  . CARDIAC CATHETERIZATION N/A 06/23/2016   Procedure: Left Heart Cath and Coronary Angiography;  Surgeon: Tonny Bollman, MD;  Location: [facility] INVASIVE CV LAB;  Service: Cardiovascular;  Laterality: N/A;  . CARDIAC CATHETERIZATION N/A 06/23/2016   Procedure: Coronary Stent Intervention;  Surgeon: Tonny Bollman, MD;  Location: [facility] INVASIVE CV LAB;  Service: Cardiovascular;  Laterality: N/A;  . CORONARY ARTERY BYPASS GRAFT  11/05/2012   Procedure: CORONARY ARTERY BYPASS GRAFTING (CABG);  Surgeon: Kerin Perna, MD;  Location: [facility] OR;  Service: Open Heart Surgery;  Laterality: N/A;  cannulate right subclavian  . ENDOVEIN HARVEST OF GREATER SAPHENOUS VEIN  11/05/2012   Procedure: ENDOVEIN HARVEST OF GREATER SAPHENOUS VEIN;  Surgeon: Kerin Perna, MD;  Location[facility] OR;  Service: Open Heart Surgery;  Laterality: Right;  . INTRAOPERATIVE TRANSESOPHAGEAL ECHOCARDIOGRAM  11/05/2012   Procedure: INTRAOPERATIVE TRANSESOPHAGEAL ECHOCARDIOGRAM;  Surgeon: Kerin Perna, MD;  Location: [facility] OR;  Service: Open Heart Surgery;  Laterality: N/A;  . INTRAOPERATIVE  TRANSESOPHAGEAL ECHOCARDIOGRAM N/A 11/21/2012   Procedure: INTRAOPERATIVE TRANSESOPHAGEAL ECHOCARDIOGRAM;  Surgeon: Kerin Perna, MD;  Location: [facility] OR;  Service: Open Heart Surgery;  Laterality: N/A;  . LEFT HEART CATHETERIZATION WITH CORONARY ANGIOGRAM N/A 11/04/2012   Procedure: LEFT HEART CATHETERIZATION WITH CORONARY ANGIOGRAM;  Surgeon: Chrystie Nose, MD;  Location: [facility] CATH LAB;  Service: Cardiovascular;  Laterality: N/A;  . MEDIASTINOSCOPY  08/2005   bronchoscopy & mediastinoscopy for mediastinal adenopathy (Dr. Donata Clay)   . NM MYOCAR PERF WALL MOTION  01/2012   lexiscan; small, fixed apical septal breast attenuation artifact & inferolateral bowel attenuation, no reversible ischemia, post-stress EF 64%, low risk scan   . PERICARDIAL WINDOW N/A 11/21/2012   Procedure: PERICARDIAL WINDOW;  Surgeon: Kerin Perna, MD;  Location: [facility] OR;  Service: Thoracic;  Laterality: N/A;  . REPLACEMENT ASCENDING AORTA  11/05/2012   Procedure: REPLACEMENT ASCENDING AORTA;  Surgeon: Kerin Perna, MD;  Location: [facility] OR;  Service: Open Heart Surgery;  Laterality: N/A;  . SUBCLAVIAN ANGIOGRAM  05/10/2000   left subclavian arteriogram; patent graft, mild stenosis (Dr. Amada Kingfisher)   .  SUBCLAVIAN BYPASS GRAFT  05/16/1999   left subclavian bypass with 80mm Darcon graft (Dr. Amada Kingfisher)  . TRANSTHORACIC ECHOCARDIOGRAM  11/20/2012   EF 65-70%, mild conc LVH, grade 2 diastolic dysfunction, mech AV, mild MR    Current Medications: Outpatient Medications Prior to Visit  Medication Sig Dispense Refill  . aspirin EC 81 MG EC tablet Take 1 tablet (81 mg total) by mouth daily.    Marland Kitchen atorvastatin (LIPITOR) 80 MG tablet Take 1 tablet (80 mg total) by mouth daily at 6 PM. 30 tablet 6  . clopidogrel (PLAVIX) 75 MG tablet Take 1 tablet (75 mg total) by mouth daily with breakfast. 30 tablet 6  . metoprolol tartrate (LOPRESSOR) 25 MG tablet Take 12.5 mg by mouth 2 (two) times daily.    . Multiple Vitamin (MULTIVITAMIN WITH  MINERALS) TABS tablet Take 1 tablet by mouth daily.    . nitroGLYCERIN (NITROSTAT) 0.4 MG SL tablet Place 1 tablet (0.4 mg total) under the tongue every 5 (five) minutes as needed for chest pain. 25 tablet 3  . polyvinyl alcohol (ARTIFICIAL TEARS) 1.4 % ophthalmic solution Place 1-2 drops into both eyes daily as needed for dry eyes.    Marland Kitchen warfarin (COUMADIN) 5 MG tablet TAKE 1 TO 1 AND 1/2 TABLET BY MOUTH EVERY DAY AS DIRECTED BY COUMADIN CLINIC (Patient taking differently: Take 0.5(2.5mg ) tablet on Monday and Thursday, all other days 1(5mg )  tablet) 105 tablet 1   No facility-administered medications prior to visit.      Allergies:   Contrast media [iodinated diagnostic agents]   Social History   Social History  . Marital status: Single    Spouse name: N/A  . Number of children: 2  . Years of education: 12   Occupational History  .  [employer]   Social History Main Topics  . Smoking status: Never Smoker  . Smokeless tobacco: Never Used  . Alcohol use No  . Drug use: No  . Sexual activity: Yes    Birth control/ protection: Post-menopausal   Other Topics Concern  . None   Social History Narrative  . None     Family History:  The patient's family history includes Hypertension in her father.   ROS:   Please see the history of present illness.    ROS All other systems reviewed and are negative.   PHYSICAL EXAM:   VS:  BP (!) 118/56 (BP Location: Right Arm, Patient Position: Sitting, Cuff Size: Normal)   Pulse 78   Ht 5\' 7"  (1.702 m)   Wt 173 lb (78.5 kg)   SpO2 99%   BMI 27.10 kg/m    GEN: Well nourished, well developed, in no acute distress  HEENT: normal  Neck: no JVD, carotid bruits, or masses Cardiac: RRR; no murmurs, rubs, or gallops,no edema  Respiratory:  clear to auscultation bilaterally, normal work of breathing GI: soft, nontender, nondistended, + BS MS: no deformity or atrophy  Skin: warm and dry, no rash Neuro:  Alert and Oriented x 3, Strength  and sensation are intact Psych: euthymic mood, full affect  Wt Readings from Last 3 Encounters:  07/14/16 173 lb (78.5 kg)  07/03/16 174 lb (78.9 kg)  06/25/16 176 lb 14.4 oz (80.2 kg)      Studies/Labs Reviewed:   EKG:  EKG is ordered today.  The ekg ordered today demonstrates Normal sinus rhythm without significant ST-T wave changes.  Recent Labs: 06/20/2016: TSH 2.676 06/23/2016: ALT 20 06/24/2016: BUN 18; Creatinine, Ser 0.98;  Potassium 4.0; Sodium 139 06/25/2016: Hemoglobin 11.0; Platelets 174   Lipid Panel    Component Value Date/Time   CHOL 142 06/20/2016 0419   CHOL 173 10/14/2013 0943   TRIG 62 06/20/2016 0419   TRIG 75 10/14/2013 0943   HDL 65 06/20/2016 0419   HDL 79 10/14/2013 0943   CHOLHDL 2.2 06/20/2016 0419   VLDL 12 06/20/2016 0419   LDLCALC 65 06/20/2016 0419   LDLCALC 79 10/14/2013 0943    Additional studies/ records that were reviewed today include:   Cath 06/22/2016  Y Graft: SVG-LAD & SVG-OM1 large and anatomically normal - however, in some imaging as there appears to be a possible ostial lesion as significant as 45 or 70%.  Ost LM to LM lesion, 95 %stenosed.  Ost 1st Mrg lesion, 90 %stenosed. There is minimal retrograde flow from the vein graft to the small branch with a relatively large somewhat unprotected circumflex and at least 2 more OM branches.  Ost RCA to Prox RCA lesion, 100 %stenosed.  Ost LAD to Prox LAD lesion, 90 %stenosed.  Normally functioning mechanical aortic valve.   Unclear what the true etiology of this patient's symptoms is.  This could simply be related to accelerated hypertension with existing disease. The existing left main disease is essentially stable from prior to CABG, however the large codominant circumflex with at least 2 branches is somewhat unprotected by the vein graft to the OM.  Very difficult to catheter manipulation from radial access given the durations in the patient's anatomy following multiple  surgeries.  Recommendations:  Return to TCU, start heparin 4 hours after sheath removal. - For ACS and mechanical valve.  Will review images additionally interventional colleagues (I did review with Dr. Clifton James) - would consider returning to the catheterization lab tomorrow for femoral access. Would reassess the vein graft ostium to ensure  that there is no true ostial lesion.   Would also then consider protected left main stenting to provide in-line flow to the large essentially unprotected codominant circumflex and OM branches.    Cath 06/23/2016 Conclusion   Successful stenting of the SVG-LAD ostium as outlined above. Complex case with true aorto-ostial disease.  Recommend ASA, Plavix, and warfarin x 3 months, then long-term warfarin and plavix. Will resume heparin and warfarin tonight, but with a mechanical aortic valve her INR could drift up.   Could never document post-PCI result but on aortic root angiography there is normal flow down the graft and the patient is pain-free.       ASSESSMENT:    1. Exertional chest pain   2. Coronary artery disease of bypass graft of native heart with stable angina pectoris (HCC)   3. Hyperlipidemia, unspecified hyperlipidemia type   4. Hx of aortic valve replacement, mechanical      PLAN:  In order of problems listed above:  1. Exertional angina: Appears to have stable angina only with exertion and relieved by rest. Her blood pressure is borderline today, I will add anti-anginal therapy including Imdur 15 mg daily. Given the fact that she is on chemotherapy, I will also add Protonix 40 mg daily for GI protection. Cath film review, despite recent stenting of SVG to LAD, however it appears she also have largely unprotected left circumflex territory from the SVG to obtuse marginal which is separated from the main left circumflex branch with significant stenosis. Interestingly enough, the characteristic of chest discomfort, although  exertional, it is different from what she experienced prior to recent stent. We  will obtain a Lexiscan stress test to look for possible ischemic region in the left circumflex territory. Otherwise she has been compliant with her aspirin, Plavix and Coumadin therapy. There is no significant ST-T wave changes on today's EKG.  2. CAD s/p SVG to LAD and SVG to OM: see above, recent PCI to ostial SVG to LAD  3. HLD: Last lipid panel obtained on 06/20/2016 showed cholesterol 142, triglycerides 62, HDL 65, LDL 65. Continue on 80 mg Lipitor daily.  4. H/o AI s/p St Jude Mechanical aortic valve 2000: Last INR 06/30/2016 was subtherapeutic.   Medication Adjustments/Labs and Tests Ordered: Current medicines are reviewed at length with the patient today.  Concerns regarding medicines are outlined above.  Medication changes, Labs and Tests ordered today are listed in the Patient Instructions below. Patient Instructions  Medication Instructions:  START- Isosorbide(Imdur) 15 mg daily START- Pantoprazole 40 mg daily  Labwork: None Ordered  Testing/Procedures: Your physician has requested that you have a lexiscan myoview. For further information please visit [email or web address] Please follow instruction sheet, as given.  Follow-Up: Your physician recommends that you schedule a follow-up appointment in: Next Friday in the evening   Any Other Special Instructions Will Be Listed Below (If Applicable).   If you need a refill on your cardiac medications before your next appointment, please call your pharmacy.      Ramond Dial, [place]  07/14/2016 10:48 PM    [facility] [address] [place], [place], [place]  [address] Phone: [phone]; Fax: [phone]

## 2016-07-14 NOTE — Telephone Encounter (Signed)
Spoke with pt states that she is having chest pain and pressure upon exertion made appt with Lisabeth Devoid today #3pm

## 2016-07-14 NOTE — Telephone Encounter (Signed)
Follow Up:    Please call,returning your call from yesterday.

## 2016-07-14 NOTE — Patient Instructions (Signed)
Medication Instructions:  START- Isosorbide(Imdur) 15 mg daily START- Pantoprazole 40 mg daily  Labwork: None Ordered  Testing/Procedures: Your physician has requested that you have a lexiscan myoview. For further information please visit [email or web address] Please follow instruction sheet, as given.  Follow-Up: Your physician recommends that you schedule a follow-up appointment in: Next Friday in the evening   Any Other Special Instructions Will Be Listed Below (If Applicable).   If you need a refill on your cardiac medications before your next appointment, please call your pharmacy.

## 2016-07-14 NOTE — Telephone Encounter (Signed)
LMTCB

## 2016-07-17 NOTE — Addendum Note (Signed)
Addended by: Barrie Dunker on: 07/17/2016 09:43 AM   Modules accepted: Orders

## 2016-07-19 ENCOUNTER — Ambulatory Visit: Payer: Commercial Managed Care - PPO | Admitting: Internal Medicine

## 2016-07-20 ENCOUNTER — Telehealth (HOSPITAL_COMMUNITY): Payer: Self-pay

## 2016-07-20 ENCOUNTER — Other Ambulatory Visit: Payer: Self-pay

## 2016-07-20 DIAGNOSIS — R072 Precordial pain: Secondary | ICD-10-CM

## 2016-07-20 NOTE — Telephone Encounter (Signed)
Encounter complete. 

## 2016-07-21 ENCOUNTER — Telehealth: Payer: Self-pay | Admitting: Physician Assistant

## 2016-07-21 ENCOUNTER — Other Ambulatory Visit (HOSPITAL_COMMUNITY): Payer: Self-pay | Admitting: *Deleted

## 2016-07-21 ENCOUNTER — Encounter (HOSPITAL_COMMUNITY): Admission: RE | Admit: 2016-07-21 | Payer: Commercial Managed Care - PPO | Source: Ambulatory Visit

## 2016-07-21 ENCOUNTER — Encounter (HOSPITAL_COMMUNITY): Payer: Commercial Managed Care - PPO

## 2016-07-21 ENCOUNTER — Ambulatory Visit (HOSPITAL_COMMUNITY)
Admission: RE | Admit: 2016-07-21 | Discharge: 2016-07-21 | Disposition: A | Discharge status: Home / Self Care | Payer: Commercial Managed Care - PPO | Source: Ambulatory Visit | Attending: Cardiology | Admitting: Cardiology

## 2016-07-21 DIAGNOSIS — I2510 Atherosclerotic heart disease of native coronary artery without angina pectoris: Secondary | ICD-10-CM | POA: Diagnosis not present

## 2016-07-21 LAB — MYOCARDIAL PERFUSION IMAGING
CHL CUP NUCLEAR SRS: 4
CHL CUP NUCLEAR SSS: 5
CSEPPHR: 82 {beats}/min
LVDIAVOL: 119 mL (ref 46–106)
LVSYSVOL: 59 mL
Rest HR: 61 {beats}/min
SDS: 1
TID: 1.08

## 2016-07-21 MED ORDER — REGADENOSON 0.4 MG/5ML IV SOLN
0.4000 mg | Freq: Once | INTRAVENOUS | Status: AC
  Administered 2016-07-21: 0.4 mg via INTRAVENOUS

## 2016-07-21 MED ORDER — TECHNETIUM TC 99M TETROFOSMIN IV KIT
32.1000 | PACK | Freq: Once | INTRAVENOUS | Status: AC | PRN
  Administered 2016-07-21: 32.1 via INTRAVENOUS
  Filled 2016-07-21: qty 33

## 2016-07-21 MED ORDER — TECHNETIUM TC 99M TETROFOSMIN IV KIT
10.9000 | PACK | Freq: Once | INTRAVENOUS | Status: AC | PRN
  Administered 2016-07-21: 10.9 via INTRAVENOUS
  Filled 2016-07-21: qty 11

## 2016-07-21 MED ORDER — AMINOPHYLLINE 25 MG/ML IV SOLN
75.0000 mg | Freq: Once | INTRAVENOUS | Status: AC
Start: 2016-07-21 — End: 2016-07-21
  Administered 2016-07-21: 75 mg via INTRAVENOUS

## 2016-07-21 NOTE — Telephone Encounter (Signed)
New message ° ° ° ° ° °Returning a call to the nurse to get stress test results °

## 2016-07-21 NOTE — Telephone Encounter (Signed)
Spoke with pt, aware of stress test results. 

## 2016-07-21 NOTE — Telephone Encounter (Signed)
LMTCB

## 2016-07-21 NOTE — Telephone Encounter (Signed)
-----   Message from [place], [place] sent at 07/21/2016  4:19 PM EDT ----- Low risk study, scar related to known coronary artery disease noted, no reversible blockage

## 2016-07-28 ENCOUNTER — Ambulatory Visit (INDEPENDENT_AMBULATORY_CARE_PROVIDER_SITE_OTHER): Payer: Commercial Managed Care - PPO | Admitting: Pharmacist Clinician (PhC)/ Clinical Pharmacy Specialist

## 2016-07-28 ENCOUNTER — Ambulatory Visit (INDEPENDENT_AMBULATORY_CARE_PROVIDER_SITE_OTHER): Payer: Commercial Managed Care - PPO | Admitting: Physician Assistant

## 2016-07-28 ENCOUNTER — Encounter: Payer: Self-pay | Admitting: Physician Assistant

## 2016-07-28 VITALS — BP 108/62 | HR 68 | Ht 67.0 in | Wt 175.0 lb

## 2016-07-28 DIAGNOSIS — I25708 Atherosclerosis of coronary artery bypass graft(s), unspecified, with other forms of angina pectoris: Secondary | ICD-10-CM | POA: Diagnosis not present

## 2016-07-28 DIAGNOSIS — I359 Nonrheumatic aortic valve disorder, unspecified: Secondary | ICD-10-CM | POA: Diagnosis not present

## 2016-07-28 DIAGNOSIS — R0600 Dyspnea, unspecified: Secondary | ICD-10-CM

## 2016-07-28 DIAGNOSIS — Z952 Presence of prosthetic heart valve: Secondary | ICD-10-CM | POA: Diagnosis not present

## 2016-07-28 DIAGNOSIS — Z7901 Long term (current) use of anticoagulants: Secondary | ICD-10-CM | POA: Diagnosis not present

## 2016-07-28 DIAGNOSIS — R0609 Other forms of dyspnea: Secondary | ICD-10-CM

## 2016-07-28 DIAGNOSIS — I10 Essential (primary) hypertension: Secondary | ICD-10-CM

## 2016-07-28 LAB — CBC
HEMATOCRIT: 21.6 % — AB (ref 35.0–45.0)
HEMOGLOBIN: 6.9 g/dL — AB (ref 11.7–15.5)
MCH: 23.8 pg — ABNORMAL LOW (ref 27.0–33.0)
MCHC: 31.9 g/dL — AB (ref 32.0–36.0)
MCV: 74.5 fL — ABNORMAL LOW (ref 80.0–100.0)
MPV: 9.7 fL (ref 7.5–12.5)
Platelets: 352 10*3/uL (ref 140–400)
RBC: 2.9 MIL/uL — ABNORMAL LOW (ref 3.80–5.10)
RDW: 18.6 % — AB (ref 11.0–15.0)
WBC: 4.7 10*3/uL (ref 3.8–10.8)

## 2016-07-28 LAB — POCT INR: INR: 3

## 2016-07-28 MED ORDER — ISOSORBIDE MONONITRATE ER 30 MG PO TB24: ORAL_TABLET | ORAL | 6 refills | Status: DC

## 2016-07-28 NOTE — Progress Notes (Signed)
Cardiology Office Note   Date:  07/28/2016   ID:  [name], DOB 1951/09/05, MRN [id number]  PCP:  [name], MD  Cardiologist:  Dr Rennis Golden 10/02//2017 Azalee Course 07/14/2016 Theodore Demark, PA-C   Chief Complaint  Patient presents with  . Follow-up  . Shortness of Breath    WALKING UP STAIRS  . Fatigue  . Chest Pain    History of Present Illness: [name] is a 65 y.o. 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 NSTEMI 06/2016 w/ severe 3 v dz w/ RCA 100%, HTN felt culprit but had DES SVG-LAD ostium, aspirin, Plavix and Coumadin with plan to drop ASA after 3 months and continue Coumadin and Plavix afterward.  [name] saw 10/13 for burning CP, MV ordered, Imdur added.   [name] presents for Further evaluation and assessment of her cardiac issues.  She is not having much chest pain right now but she is having significant dyspnea on exertion. She doesn't feel the dyspnea is any [name] now than it was before she had her heart attack. She can only walk very slowly. She cannot make it up a full flight of steps without feeling that her her heart is racing and pounding and she is extremely short of breath. She cannot walk from where she works across to the restroom without having to stop on the way.   She is working every day. She is trying to get [name] and increase her endurance. However, she doesn't feel she has made any progress at all. She denies morning lower extremity edema, but she will get a little bit of swelling in her legs during the day. She denies orthopnea, or PND. She is not having any problems with cough or fevers.  There was some confusion about the Imdur, but she does not believe she is taking it. She is compliant with her other medications. She is not aware of any bleeding  issues. She has not had any orthostatic symptoms.  While here, she was also seen by the Pharmacy staff and had a Coumadin check.   Past Medical History:  Diagnosis Date  . Anginal pain (HCC)   . Coronary artery disease    mech AVR, CABG  . Hyperlipidemia   . Hypertension   . RA (rheumatoid arthritis) (HCC)   . S/P AVR (aortic valve replacement) 08/1999   St. Jude AVR for aortic insuff, Goal INR 2.5-3.5  . 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 Donata Clay) - complicated by pericardial hematoma --> subxiphoid pericardial exposure and evacuation b. Cath 06/2016: DES to SVG-LAD ostium  . Sarcoid (HCC)   . Shortness of breath   . 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 AORTIC VALVE REPLACEMENT  08/23/1999   37mm St. Jude, Bentall procedure, root replacemtn   . CARDIAC CATHETERIZATION  08/1999   normal L main, LAD & first diagonal are normal, normal Cfx, RCA with mild ostial narrowing, normal LV systolic function, severe AI, mild MR, normal PAP (Dr. Mervyn Skeeters. Little)   . CARDIAC CATHETERIZATION N/A 06/22/2016   Procedure: Left Heart Cath and Cors/Grafts Angiography;  Surgeon: Marykay Lex,  MD;  Location: [facility] INVASIVE CV LAB;  Service: Cardiovascular;  Laterality: N/A;  . CARDIAC CATHETERIZATION N/A 06/23/2016   Procedure: Left Heart Cath and Coronary Angiography;  Surgeon: Tonny Bollman, MD;  Location: [facility] INVASIVE CV LAB;  Service: Cardiovascular;  Laterality: N/A;  . CARDIAC CATHETERIZATION N/A 06/23/2016   Procedure: Coronary Stent Intervention;  Surgeon: Tonny Bollman, MD;  Location: [facility] INVASIVE CV LAB;  Service: Cardiovascular;  Laterality: N/A;  . CORONARY ARTERY BYPASS GRAFT  11/05/2012   Procedure: CORONARY ARTERY BYPASS GRAFTING (CABG);  Surgeon: Kerin Perna, MD;  Location: [facility] OR;  Service: Open Heart Surgery;  Laterality:  N/A;  cannulate right subclavian  . ENDOVEIN HARVEST OF GREATER SAPHENOUS VEIN  11/05/2012   Procedure: ENDOVEIN HARVEST OF GREATER SAPHENOUS VEIN;  Surgeon: Kerin Perna, MD;  Location: [facility] OR;  Service: Open Heart Surgery;  Laterality: Right;  . INTRAOPERATIVE TRANSESOPHAGEAL ECHOCARDIOGRAM  11/05/2012   Procedure: INTRAOPERATIVE TRANSESOPHAGEAL ECHOCARDIOGRAM;  Surgeon: Kerin Perna, MD;  Location: [facility] OR;  Service: Open Heart Surgery;  Laterality: N/A;  . INTRAOPERATIVE TRANSESOPHAGEAL ECHOCARDIOGRAM N/A 11/21/2012   Procedure: INTRAOPERATIVE TRANSESOPHAGEAL ECHOCARDIOGRAM;  Surgeon: Kerin Perna, MD;  Location: [facility] OR;  Service: Open Heart Surgery;  Laterality: N/A;  . LEFT HEART CATHETERIZATION WITH CORONARY ANGIOGRAM N/A 11/04/2012   Procedure: LEFT HEART CATHETERIZATION WITH CORONARY ANGIOGRAM;  Surgeon: Chrystie Nose, MD;  Location: [facility] CATH LAB;  Service: Cardiovascular;  Laterality: N/A;  . MEDIASTINOSCOPY  08/2005   bronchoscopy & mediastinoscopy for mediastinal adenopathy (Dr. Donata Clay)   . NM MYOCAR PERF WALL MOTION  01/2012   lexiscan; small, fixed apical septal breast attenuation artifact & inferolateral bowel attenuation, no reversible ischemia, post-stress EF 64%, low risk scan   . PERICARDIAL WINDOW N/A 11/21/2012   Procedure: PERICARDIAL WINDOW;  Surgeon: Kerin Perna, MD;  Location: [facility] OR;  Service: Thoracic;  Laterality: N/A;  . REPLACEMENT ASCENDING AORTA  11/05/2012   Procedure: REPLACEMENT ASCENDING AORTA;  Surgeon: Kerin Perna, MD;  Location: [facility] OR;  Service: Open Heart Surgery;  Laterality: N/A;  . SUBCLAVIAN ANGIOGRAM  05/10/2000   left subclavian arteriogram; patent graft, mild stenosis (Dr. Amada Kingfisher)   . SUBCLAVIAN BYPASS GRAFT  05/16/1999   left subclavian bypass with 37mm Darcon graft (Dr. Amada Kingfisher)  . TRANSTHORACIC ECHOCARDIOGRAM  11/20/2012   EF 65-70%, mild conc LVH, grade 2 diastolic dysfunction, mech AV, mild MR    Current Outpatient Prescriptions    Medication Sig Dispense Refill  . aspirin EC 81 MG EC tablet Take 1 tablet (81 mg total) by mouth daily.    Marland Kitchen atorvastatin (LIPITOR) 80 MG tablet Take 1 tablet (80 mg total) by mouth daily at 6 PM. 30 tablet 6  . clopidogrel (PLAVIX) 75 MG tablet Take 1 tablet (75 mg total) by mouth daily with breakfast. 30 tablet 6  . isosorbide mononitrate (IMDUR) 30 MG 24 hr tablet Take 0.5 tablets (15 mg total) by mouth daily. 15 tablet 9  . metoprolol tartrate (LOPRESSOR) 25 MG tablet Take 12.5 mg by mouth 2 (two) times daily.    . Multiple Vitamin (MULTIVITAMIN WITH MINERALS) TABS tablet Take 1 tablet by mouth daily.    . nitroGLYCERIN (NITROSTAT) 0.4 MG SL tablet Place 1 tablet (0.4 mg total) under the tongue every 5 (five) minutes as needed for chest pain. 25 tablet 3  . pantoprazole (PROTONIX) 40 MG tablet Take 1 tablet (40 mg total) by mouth daily. 30 tablet 11  . polyvinyl alcohol (  ARTIFICIAL TEARS) 1.4 % ophthalmic solution Place 1-2 drops into both eyes daily as needed for dry eyes.    Marland Kitchen warfarin (COUMADIN) 5 MG tablet TAKE 1 TO 1 AND 1/2 TABLET BY MOUTH EVERY DAY AS DIRECTED BY COUMADIN CLINIC (Patient taking differently: Take 0.5(2.5mg ) tablet on Monday and Thursday, all other days 1(5mg )  tablet) 105 tablet 1   No current facility-administered medications for this visit.     Allergies:   Contrast media [iodinated diagnostic agents]    Social History:  The patient  reports that she has never smoked. She has never used smokeless tobacco. She reports that she does not drink alcohol or use drugs.   Family History:  The patient's family history includes Hypertension in her father.   ROS:  Please see the history of present illness. All other systems are reviewed and negative.   PHYSICAL EXAM: VS:  BP 108/62   Pulse 68   Ht 5\' 7"  (1.702 m)   Wt 175 lb (79.4 kg)   BMI 27.41 kg/m  , BMI Body mass index is 27.41 kg/m. GEN: Well nourished, well developed, female in no acute distress  HEENT:  normal for age  Neck: Minimal JVD, no carotid bruit> radiation of murmur noted to her left carotid, no masses Cardiac: RRR; 3/6 murmur, crisp valve click, no rubs, or gallops Respiratory:  Slightly decreased breath sounds bases with a few rales bilaterally, normal work of breathing GI: soft, nontender, nondistended, + BS MS: no deformity or atrophy; no edema; distal pulses are 2+ in all 4 extremities   Skin: warm and dry, no rash Neuro:  Strength and sensation are intact Psych: euthymic mood, full affect   EKG:  EKG is ordered today. The ekg ordered today demonstrates sinus rhythm, heart rate 68, no acute ischemic changes, QRS duration 90 ms. Q waves in lead 3 only  ECHO: 06/21/2016 - Left ventricle: The cavity size was normal. Wall thickness was   normal. Systolic function was mildly to moderately reduced. The   estimated ejection fraction was in the range of 40% to 45%.   Moderate hypokinesis of the basalinferior myocardium. Features   are consistent with a pseudonormal left ventricular filling   pattern, with concomitant abnormal relaxation and increased   filling pressure (grade 2 diastolic dysfunction). - Aortic valve: A mechanical prosthesis was present. Valve area   (VTI): 1.45 cm^2. Valve area (Vmax): 1.34 cm^2. Valve area   (Vmean): 1.4 cm^2. - Left atrium: The atrium was mildly dilated. - Tricuspid valve: There was mild-moderate regurgitation.  Cath Diagnostic Diagram 06/22/2016 45-70% SVG-LAD rx w/ DES     MYOVIEW 07/21/2016  The left ventricular ejection fraction is mildly decreased (45-54%).  Nuclear stress EF: 50%.  No T wave inversion was noted during stress.  There was no ST segment deviation noted during stress.  Defect 1: There is a small defect of mild severity.  This is an intermediate risk study.  Moderate size, mild intensity fixed septal perfusion artifact secondary to incoordinate septal motion/dyskinesis. No reversible ischemia. LVEF 50% with  incoordinate septal motion. This is a low risk study.   Recent Labs: 06/20/2016: TSH 2.676 06/23/2016: ALT 20 06/24/2016: BUN 18; Creatinine, Ser 0.98; Potassium 4.0; Sodium 139 06/25/2016: Hemoglobin 11.0; Platelets 174    Lipid Panel    Component Value Date/Time   CHOL 142 06/20/2016 0419   CHOL 173 10/14/2013 0943   TRIG 62 06/20/2016 0419   TRIG 75 10/14/2013 0943   HDL 65 06/20/2016 0419  HDL 79 10/14/2013 0943   CHOLHDL 2.2 06/20/2016 0419   VLDL 12 06/20/2016 0419   LDLCALC 65 06/20/2016 0419   LDLCALC 79 10/14/2013 0943     Wt Readings from Last 3 Encounters:  07/28/16 175 lb (79.4 kg)  07/21/16 173 lb (78.5 kg)  07/14/16 173 lb (78.5 kg)     Other studies Reviewed: Additional studies/ records that were reviewed today include: Office notes, hospital records and testing.  ASSESSMENT AND PLAN:  1.  Dyspnea on exertion: I'm concerned that she has not been able to increase her activity level since her non-STEMI. Because of her work schedule, she has not consider attending cardiac rehabilitation, but she will look into it. Currently, she has no paid time off available to her and cannot afford to lose work.  She does not have any signs or symptoms of volume overload. She is not aware of any bleeding issues, but we will check a CBC.  She had not started taking the Imdur due to confusion. She will start taking it at one half tablet daily and if she tolerates it, will increase it to 1 tablet daily. She is to let us know if she doesn't tolerate it, or if it does not help her symptoms.  2. CAD: It is possible that her dyspnea is an anginal equivalent. I will review her coronary artery disease with Dr. Rennis Golden to see if there are any other options for percutaneous intervention. Continue current therapy with aspirin, high-dose statin, beta blocker and nitrates.  3. St. Jude aortic valve: The Pharmacy staff was in to check her Coumadin was therapeutic. She is not having any known  bleeding issues.  Current medicines are reviewed at length with the patient today.  The patient does not have concerns regarding medicines.  The following changes have been made:  Start Imdur and increase as tolerated  Labs/ tests ordered today include:   Orders Placed This Encounter  Procedures  . CBC  . EKG 12-Lead     Disposition:   FU with Dr. Rennis Golden  Signed, Theodore Demark, PA-C  07/28/2016 4:40 PM    [facility] Phone: [phone]; Fax: [phone]  This note was written with the assistance of speech recognition software. Please excuse any transcriptional errors.

## 2016-07-28 NOTE — Patient Instructions (Signed)
Medication Instructions: Take Isosorbide--1/2 tablet daily for 1 week, then switch to 1 tablet daily.   Labwork:  Your physician recommends that you return for lab work today: CBC   Follow-Up: Your physician recommends that you schedule a follow-up appointment in: 2 months with Dr. Rennis Golden.   Any Other Special Instructions will be listed below:  --Let us know if you need a letter saying you are not able to climb stairs.--   If you need a refill on your cardiac medications before your next appointment, please call your pharmacy.

## 2016-07-30 ENCOUNTER — Encounter (HOSPITAL_COMMUNITY): Payer: Self-pay

## 2016-07-30 ENCOUNTER — Emergency Department (HOSPITAL_COMMUNITY)
Admission: EM | Admit: 2016-07-30 | Discharge: 2016-07-30 | Discharge status: Against Medical Advice | Payer: Commercial Managed Care - PPO | Attending: Emergency Medicine | Admitting: Emergency Medicine

## 2016-07-30 ENCOUNTER — Telehealth: Payer: Self-pay | Admitting: Physician Assistant

## 2016-07-30 DIAGNOSIS — D591 Other autoimmune hemolytic anemias: Secondary | ICD-10-CM | POA: Diagnosis not present

## 2016-07-30 DIAGNOSIS — Z951 Presence of aortocoronary bypass graft: Secondary | ICD-10-CM | POA: Insufficient documentation

## 2016-07-30 DIAGNOSIS — I2510 Atherosclerotic heart disease of native coronary artery without angina pectoris: Secondary | ICD-10-CM | POA: Diagnosis not present

## 2016-07-30 DIAGNOSIS — Z79899 Other long term (current) drug therapy: Secondary | ICD-10-CM | POA: Diagnosis not present

## 2016-07-30 DIAGNOSIS — Z7901 Long term (current) use of anticoagulants: Secondary | ICD-10-CM | POA: Diagnosis not present

## 2016-07-30 DIAGNOSIS — Z7982 Long term (current) use of aspirin: Secondary | ICD-10-CM | POA: Insufficient documentation

## 2016-07-30 DIAGNOSIS — I252 Old myocardial infarction: Secondary | ICD-10-CM | POA: Insufficient documentation

## 2016-07-30 DIAGNOSIS — R799 Abnormal finding of blood chemistry, unspecified: Secondary | ICD-10-CM | POA: Diagnosis present

## 2016-07-30 DIAGNOSIS — I10 Essential (primary) hypertension: Secondary | ICD-10-CM | POA: Insufficient documentation

## 2016-07-30 DIAGNOSIS — D649 Anemia, unspecified: Secondary | ICD-10-CM

## 2016-07-30 LAB — CBC WITH DIFFERENTIAL/PLATELET
BASOS ABS: 0 10*3/uL (ref 0.0–0.1)
Basophils Relative: 0 %
EOS ABS: 0.1 10*3/uL (ref 0.0–0.7)
Eosinophils Relative: 3 %
HCT: 23.4 % — ABNORMAL LOW (ref 36.0–46.0)
HEMOGLOBIN: 7.1 g/dL — AB (ref 12.0–15.0)
LYMPHS ABS: 0.7 10*3/uL (ref 0.7–4.0)
LYMPHS PCT: 18 %
MCH: 22 pg — ABNORMAL LOW (ref 26.0–34.0)
MCHC: 30.3 g/dL (ref 30.0–36.0)
MCV: 72.4 fL — ABNORMAL LOW (ref 78.0–100.0)
Monocytes Absolute: 0.3 10*3/uL (ref 0.1–1.0)
Monocytes Relative: 8 %
NEUTROS ABS: 2.7 10*3/uL (ref 1.7–7.7)
Neutrophils Relative %: 71 %
PLATELETS: 312 10*3/uL (ref 150–400)
RBC: 3.23 MIL/uL — ABNORMAL LOW (ref 3.87–5.11)
RDW: 19 % — AB (ref 11.5–15.5)
WBC: 3.8 10*3/uL — ABNORMAL LOW (ref 4.0–10.5)

## 2016-07-30 LAB — BASIC METABOLIC PANEL
Anion gap: 6 (ref 5–15)
BUN: 6 mg/dL (ref 6–20)
CALCIUM: 9.3 mg/dL (ref 8.9–10.3)
CO2: 23 mmol/L (ref 22–32)
CREATININE: 0.75 mg/dL (ref 0.44–1.00)
Chloride: 111 mmol/L (ref 101–111)
Glucose, Bld: 108 mg/dL — ABNORMAL HIGH (ref 65–99)
Potassium: 3.6 mmol/L (ref 3.5–5.1)
SODIUM: 140 mmol/L (ref 135–145)

## 2016-07-30 LAB — PROTIME-INR
INR: 2.46
PROTHROMBIN TIME: 27.1 s — AB (ref 11.4–15.2)

## 2016-07-30 LAB — POC OCCULT BLOOD, ED: FECAL OCCULT BLD: NEGATIVE

## 2016-07-30 LAB — PREPARE RBC (CROSSMATCH)

## 2016-07-30 MED ORDER — SODIUM CHLORIDE 0.9 % IV SOLN: Freq: Once | INTRAVENOUS | Status: DC

## 2016-07-30 NOTE — ED Notes (Signed)
Unable to obtain IV access X 2 to LAC.

## 2016-07-30 NOTE — Telephone Encounter (Signed)
Patient was on both Plavix and Coumadin recently seen in the office for exertional angina, stress test was negative. She was still having significant shortness of breath with exertion despite having no more chest pain on follow-up. CBC was drawn which shows hemoglobin has significantly down from previous 11.0 down to 6.9.   Informed by cirrhosis labs this morning, I have called the patient around 7:30, left her a message. I have called again around 9 AM, she did pick up this time. I have instructed to seek medical attention at local emergency room to potentially get blood transfusion. She has lost more than 4 units of blood in the last 1 month. Given the significant blood loss, I do not think this can be worked up as outpatient.  Ramond Dial PA Pager: [phone]

## 2016-07-30 NOTE — ED Triage Notes (Signed)
Patient here after being called by MD this am telling her HGB is low. Denies pain, no weakness. Alert and oriented. Had stent placed in September. No complaints

## 2016-07-30 NOTE — Discharge Instructions (Signed)
Take iron supplement 1-2 times a day Call your cardiologist to schedule an appointment as soon as possible for this week Return for worsening symptoms

## 2016-07-30 NOTE — ED Provider Notes (Signed)
[facility]-EMERGENCY DEPT Provider Note   CSN: [id number] Arrival date & time: 07/30/16  1037     History   Chief Complaint Chief Complaint  Patient presents with  . Abnormal Lab    HPI [name] is a 65 y.o. female who presents with low hgb levels and sent by her PCP. PMH significant for s/p aortic valve replacement on chronic anticoagulation, CAD s/p CABG and recent NSTEMI in Sept requiring stenting to the SVG-LAD ostium, carotid disease, sarcoid, HLD. Her last office visit with cardiology was on 10/27 at which she complained of progressive DOE. They drew labs and called this morning due hgb levels being 6.9. Today she complains of significant dyspnea on exertion which has gotten worse since her stenting in September. She states it is difficult to get to her apartment which is on the second floor. She also endorses fatigue. She denies current chest pain, SOB, abdominal pain, N/V/D, melena/hematochezia. She has never had a colonoscopy. She is on a daily ASA, Plavix, and Warfarin.  HPI  Past Medical History:  Diagnosis Date  . Anginal pain (HCC)   . Coronary artery disease    mech AVR, CABG  . Hyperlipidemia   . Hypertension   . RA (rheumatoid arthritis) (HCC)   . S/P AVR (aortic valve replacement) 08/1999   St. Jude AVR for aortic insuff, Goal INR 2.5-3.5  . 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 Donata Clay) - complicated by pericardial hematoma --> subxiphoid pericardial exposure and evacuation b. Cath 06/2016: DES to SVG-LAD ostium  . Sarcoid (HCC)   . Shortness of breath   . Subclavian artery stenosis (HCC) 06/2000   left subclavian bypass    Patient Active Problem List   Diagnosis Date Noted  . Aortic valve disease 07/03/2016  . Cardiomyopathy, ischemic 07/03/2016  . Hx of CABG   . NSTEMI (non-ST elevated myocardial infarction) (HCC) 06/20/2016  . Acute pericardial effusion post op  without tamponade - S/P subxiphoid pericaridal window 11/21/12 11/21/2012  . PVD , known Lt SCA stenosis 11/21/2012  . Pleural effusion, RT, post op 11/21/2012  . Unstable angina on admission 11/04/12 11/04/2012  . Coronary artery disease involving native coronary artery 11/04/2012  . S/P AVR (aortic valve replacement) 11/04/2012  . Dilated ascending aorta at cath 11/03/12 - S/P root replacement 11/05/12 11/04/2012  . Chronic anticoagulation for mechanical AVR 11/04/2012  . Contrast media allergy 11/04/2012  . Essential hypertension 11/04/2012  . Sarcoidosis (HCC) 11/04/2012    Past Surgical History:  Procedure Laterality Date  . AORTIC VALVE REPLACEMENT  08/23/1999   26mm St. Jude, Bent[place] procedure, root replacemtn   . CARDIAC CATHETERIZATION  08/1999   normal L main, LAD & first diagonal are normal, normal Cfx, RCA with mild ostial narrowing, normal LV systolic function, severe AI, mild MR, normal PAP (Dr. Mervyn Skeeters. Little)   . CARDIAC CATHETERIZATION N/A 06/22/2016   Procedure: Left Heart Cath and Cors/Grafts Angiography;  Surgeon: Marykay Lex, MD;  Location[facility] INVASIVE CV LAB;  Service: Cardiovascular;  Laterality: N/A;  . CARDIAC CATHETERIZATION N/A 06/23/2016   Procedure: Left Heart Cath and Coronary Angiography;  Surgeon: Tonny Bollman, MD;  Location: [facility] INVASIVE CV LAB;  Service: Cardiovascular;  Laterality: N/A;  . CARDIAC CATHETERIZATION N/A 06/23/2016   Procedure: Coronary Stent Intervention;  Surgeon: Tonny Bollman, MD;  Location: [facility] INVASIVE CV LAB;  Service: Cardiovascular;  Laterality: N/A;  .  CORONARY ARTERY BYPASS GRAFT  11/05/2012   Procedure: CORONARY ARTERY BYPASS GRAFTING (CABG);  Surgeon: Kerin Perna, MD;  Location: [facility] OR;  Service: Open Heart Surgery;  Laterality: N/A;  cannulate right subclavian  . ENDOVEIN HARVEST OF GREATER SAPHENOUS VEIN  11/05/2012   Procedure: ENDOVEIN HARVEST OF GREATER SAPHENOUS VEIN;  Surgeon: Kerin Perna, MD;  Location: [facility] OR;  Service: Open  Heart Surgery;  Laterality: Right;  . INTRAOPERATIVE TRANSESOPHAGEAL ECHOCARDIOGRAM  11/05/2012   Procedure: INTRAOPERATIVE TRANSESOPHAGEAL ECHOCARDIOGRAM;  Surgeon: Kerin Perna, MD;  Location: [facility] OR;  Service: Open Heart Surgery;  Laterality: N/A;  . INTRAOPERATIVE TRANSESOPHAGEAL ECHOCARDIOGRAM N/A 11/21/2012   Procedure: INTRAOPERATIVE TRANSESOPHAGEAL ECHOCARDIOGRAM;  Surgeon: Kerin Perna, MD;  Location: [facility] OR;  Service: Open Heart Surgery;  Laterality: N/A;  . LEFT HEART CATHETERIZATION WITH CORONARY ANGIOGRAM N/A 11/04/2012   Procedure: LEFT HEART CATHETERIZATION WITH CORONARY ANGIOGRAM;  Surgeon: Chrystie Nose, MD;  Location: [facility] CATH LAB;  Service: Cardiovascular;  Laterality: N/A;  . MEDIASTINOSCOPY  08/2005   bronchoscopy & mediastinoscopy for mediastinal adenopathy (Dr. Donata Clay)   . NM MYOCAR PERF WALL MOTION  01/2012   lexiscan; small, fixed apical septal breast attenuation artifact & inferolateral bowel attenuation, no reversible ischemia, post-stress EF 64%, low risk scan   . PERICARDIAL WINDOW N/A 11/21/2012   Procedure: PERICARDIAL WINDOW;  Surgeon: Kerin Perna, MD;  Location: [facility] OR;  Service: Thoracic;  Laterality: N/A;  . REPLACEMENT ASCENDING AORTA  11/05/2012   Procedure: REPLACEMENT ASCENDING AORTA;  Surgeon: Kerin Perna, MD;  Location: [facility] OR;  Service: Open Heart Surgery;  Laterality: N/A;  . SUBCLAVIAN ANGIOGRAM  05/10/2000   left subclavian arteriogram; patent graft, mild stenosis (Dr. Amada Kingfisher)   . SUBCLAVIAN BYPASS GRAFT  05/16/1999   left subclavian bypass with 28mm Darcon graft (Dr. Amada Kingfisher)  . TRANSTHORACIC ECHOCARDIOGRAM  11/20/2012   EF 65-70%, mild conc LVH, grade 2 diastolic dysfunction, mech AV, mild MR    OB History    No data available       Home Medications    Prior to Admission medications   Medication Sig Start Date End Date Taking? Authorizing Provider  aspirin EC 81 MG EC tablet Take 1 tablet (81 mg total) by mouth daily. 06/26/16    Ellsworth Lennox, PA  atorvastatin (LIPITOR) 80 MG tablet Take 1 tablet (80 mg total) by mouth daily at 6 PM. 06/25/16   Ellsworth Lennox, PA  clopidogrel (PLAVIX) 75 MG tablet Take 1 tablet (75 mg total) by mouth daily with breakfast. 06/26/16   Ellsworth Lennox, PA  isosorbide mononitrate (IMDUR) 30 MG 24 hr tablet Take 1/2 tablet by mouth daily for one week. Then switch to 1 tablet daily. 07/28/16   Rhonda G Barrett, PA-C  metoprolol tartrate (LOPRESSOR) 25 MG tablet Take 12.5 mg by mouth 2 (two) times daily.    Historical Provider, MD  Multiple Vitamin (MULTIVITAMIN WITH MINERALS) TABS tablet Take 1 tablet by mouth daily.    Historical Provider, MD  nitroGLYCERIN (NITROSTAT) 0.4 MG SL tablet Place 1 tablet (0.4 mg total) under the tongue every 5 (five) minutes as needed for chest pain. 06/25/16   Ellsworth Lennox, PA  pantoprazole (PROTONIX) 40 MG tablet Take 1 tablet (40 mg total) by mouth daily. 07/14/16   Azalee Course, PA  polyvinyl alcohol (ARTIFICIAL TEARS) 1.4 % ophthalmic solution Place 1-2 drops into both eyes daily as needed for dry eyes.    Historical  Provider, MD  warfarin (COUMADIN) 5 MG tablet TAKE 1 TO 1 AND 1/2 TABLET BY MOUTH EVERY DAY AS DIRECTED BY COUMADIN CLINIC Patient taking differently: Take 0.5(2.5mg ) tablet on Monday and Thursday, all other days 1(5mg )  tablet 06/19/16   Chrystie Nose, MD    Family History Family History  Problem Relation Age of Onset  . Hypertension Father     Social History Social History  Substance Use Topics  . Smoking status: Never Smoker  . Smokeless tobacco: Never Used  . Alcohol use No     Allergies   Contrast media [iodinated diagnostic agents]   Review of Systems Review of Systems  Constitutional: Positive for fatigue. Negative for fever.  Respiratory: Positive for shortness of breath.        With exertion  Cardiovascular: Negative for chest pain.  Gastrointestinal: Negative for abdominal pain, blood in stool, nausea and  vomiting.  All other systems reviewed and are negative.    Physical Exam Updated Vital Signs BP 127/56 (BP Location: Left Arm)   Pulse 67   Temp 98.4 F (36.9 C) (Oral)   Resp 18   SpO2 100%   Physical Exam  Constitutional: She is oriented to person, place, and time. She appears well-developed and well-nourished. No distress.  HENT:  Head: Normocephalic and atraumatic.  Pale conjunctiva  Eyes: Conjunctivae are normal. Pupils are equal, round, and reactive to light. Right eye exhibits no discharge. Left eye exhibits no discharge. No scleral icterus.  Neck: Normal range of motion. Neck supple.  Cardiovascular: Normal rate and regular rhythm.  Exam reveals no gallop and no friction rub.   Murmur heard. Mechanical valve click  Pulmonary/Chest: Effort normal and breath sounds normal. No respiratory distress. She has no wheezes. She has no rales. She exhibits no tenderness.  Abdominal: Soft. Bowel sounds are normal. She exhibits no distension and no mass. There is no tenderness. There is no rebound and no guarding. No hernia.  Genitourinary:  Genitourinary Comments: Rectal: No gross blood. No hemorrhoids, fissures, redness, area of fluctuance, lesions, or tenderness. Chaperone present during exam.   Musculoskeletal: She exhibits no edema.  Neurological: She is alert and oriented to person, place, and time.  Skin: Skin is warm and dry.  Psychiatric: She has a normal mood and affect. Her behavior is normal.  Nursing note and vitals reviewed.    ED Treatments / Results  Labs (all labs ordered are listed, but only abnormal results are displayed) Labs Reviewed  CBC WITH DIFFERENTIAL/PLATELET - Abnormal; Notable for the following:       Result Value   WBC 3.8 (*)    RBC 3.23 (*)    Hemoglobin 7.1 (*)    HCT 23.4 (*)    MCV 72.4 (*)    MCH 22.0 (*)    RDW 19.0 (*)    All other components within normal limits  BASIC METABOLIC PANEL - Abnormal; Notable for the following:     Glucose, Bld 108 (*)    All other components within normal limits  PROTIME-INR - Abnormal; Notable for the following:    Prothrombin Time 27.1 (*)    All other components within normal limits  POC OCCULT BLOOD, ED  TYPE AND SCREEN  PREPARE RBC (CROSSMATCH)    EKG  EKG Interpretation None       Radiology No results found.  Procedures Procedures (including critical care time)  Medications Ordered in ED Medications  0.9 %  sodium chloride infusion (not administered)  Initial Impression / Assessment and Plan / ED Course  I have reviewed the triage vital signs and the nursing notes.  Pertinent labs & imaging results that were available during my care of the patient were reviewed by me and considered in my medical decision making (see chart for details).  Clinical Course   65 year old female presents with symptomatic anemia. Patient is afebrile, not tachycardic or tachypneic, normotensive, and not hypoxic. Hemoccult is negative. CBC remarkable for H&H of 7.1/23.4 which has dropped from 11/34 one month ago but stable from 2 days ago. BMP unremarkable. Patient typed and screened. Discussed admission with patient which she refused. She states she has already taken a significant amount of time off from work due to medical problems and cannot afford to take any more and that her job is strict. Shared visit with Dr. Criss Alvine who also saw patient. Will have her  sign out AMA. She did agree to receive a unit of blood and will call Cardiology in the morning for follow up. She states she will not have further work up until Friday when she is off again. Also discussed taking daily iron.  We discussed the nature and purpose, risks and benefits, as well as, the alternatives of treatment. Time was given to allow the opportunity to ask questions and consider their options, and after the discussion, the patient decided to refuse the offerred treatment. The patient was informed that refusal could  lead to, but was not limited to, death, permanent disability, or severe pain. If present, I asked the relatives or significant others to dissuade them without success. Prior to refusing, I determined that the patient had the capacity to make their decision and understood the consequences of that decision. After refusal, I made every reasonable opportunity to treat them to the best of my ability.  The patient was notified that they may return to the emergency department at any time for further treatment.     Final Clinical Impressions(s) / ED Diagnoses   Final diagnoses:  Symptomatic anemia    New Prescriptions New Prescriptions   No medications on file     Bethel Born, PA-C 07/30/16 1812    Pricilla Loveless, MD 08/07/16 1715

## 2016-07-30 NOTE — ED Notes (Signed)
Pt denies any symptoms but reports MD requested she come in for blood transfusion. Denies fatigue.

## 2016-07-31 ENCOUNTER — Telehealth: Payer: Self-pay | Admitting: Physician Assistant

## 2016-07-31 ENCOUNTER — Encounter: Payer: Self-pay | Admitting: Gastroenterology

## 2016-07-31 DIAGNOSIS — D649 Anemia, unspecified: Secondary | ICD-10-CM

## 2016-07-31 LAB — TYPE AND SCREEN
ABO/RH(D): O POS
Antibody Screen: NEGATIVE
Unit division: 0

## 2016-07-31 NOTE — Telephone Encounter (Signed)
-----   Message from [place], [place] sent at 07/30/2016  6:42 PM EDT ----- Regarding: refer to GI I called Mrs. Markowicz to let her know about severe anemia and instructed her to go to ED, unfortunately she refused admission after 1 unit of packed red blood cell transfusion as she fear it will interfere with her work. If I remember correctly, she can only have Friday afternoon off. Please refer the patient to be seen by GI urgently in the afternoon of 08/04/2016, earlier if she is agreeable (but she likely won't due to the concern of taking off too many days from her job).   Ramond Dial PA Pager: [phone]

## 2016-07-31 NOTE — Telephone Encounter (Signed)
Left message for pt to call back. Sent referral to GI.

## 2016-08-04 ENCOUNTER — Encounter (HOSPITAL_COMMUNITY): Payer: Commercial Managed Care - PPO

## 2016-08-08 ENCOUNTER — Telehealth: Payer: Self-pay | Admitting: *Deleted

## 2016-08-08 NOTE — Telephone Encounter (Signed)
-----   Message from Darrol Jump, PA-C sent at 08/08/2016  8:04 AM EST ----- Please call the pt and let her know to stop ASA. Thanks ----- Message ----- From: Chrystie Nose, MD Sent: 08/07/2016   9:46 AM To: Darrol Jump, PA-C  Yes .Marland Kitchen D/c aspirin, continue plavix.  Dr. Rexene Edison  ----- Message ----- From: Darrol Jump, PA-C Sent: 08/01/2016   1:49 PM To: Chrystie Nose, MD  In case you did not know: Ms Eaglin was recently seen>>DOE>>CBC w/ H&H 6.9/27.1; INR 2.46 Seen in ER s/p 1 U [facility] GI eval arranged  On ASA/Plavix and coumadin since 09/22 DES. Plan was for ASA x 3 months then Plavix and coumadin only. Should we go ahead and d/c the ASA?  Thanks [employer]

## 2016-08-08 NOTE — Telephone Encounter (Signed)
LMOVM TO CALL BACK FOR DIRECTIONS OF MEDICATION TO STOP

## 2016-08-09 ENCOUNTER — Telehealth: Payer: Self-pay | Admitting: *Deleted

## 2016-08-09 NOTE — Telephone Encounter (Signed)
LMOVM  FOR PT TO CONTACT OFFICE BACK ABOUT REQUESTED A WORK NOTE FROM  RHONDA  BARRETT

## 2016-08-09 NOTE — Telephone Encounter (Signed)
-----   Message from Rhonda G Barrett, PA-C sent at 08/08/2016  8:04 AM EST ----- Please call the pt and let her know to stop ASA. Thanks ----- Message ----- From: Kenneth C Hilty, MD Sent: 08/07/2016   9:46 AM To: Rhonda G Barrett, PA-C  Yes .. D/c aspirin, continue plavix.  Dr. H  ----- Message ----- From: Rhonda G Barrett, PA-C Sent: 08/01/2016   1:49 PM To: Kenneth C Hilty, MD  In case you did not know: [name] was recently seen>>DOE>>CBC w/ H&H 6.9/27.1; INR 2.46 Seen in ER s/p 1 U [facility] GI eval arranged  On ASA/Plavix and coumadin since 09/22 DES. Plan was for ASA x 3 months then Plavix and coumadin only. Should we go ahead and d/c the ASA?  Thanks Rhonda   

## 2016-08-10 ENCOUNTER — Encounter: Payer: Self-pay | Admitting: Physician Assistant

## 2016-08-10 ENCOUNTER — Telehealth: Payer: Self-pay | Admitting: *Deleted

## 2016-08-10 ENCOUNTER — Encounter: Payer: Self-pay | Admitting: *Deleted

## 2016-08-10 NOTE — Telephone Encounter (Signed)
SPOKE TO PT ABOUT CLARIFICATIONS OF WORK NOTE TO BE COMPLETED BY RHONDA BARRETT FOR  WORK.   PT  STATED IT WAS FOR HER SECOND JOB AND NEEDED IT TO STATE SHE MAY RETURN TO WORK ON 08/28/16 AFTER HER PROCEDURE FOR JOB SECURITY.  PT WAS TOLD THAT THE PROVIDER WILL BE CONTACTED BACK ABOUT WORK NOTE TO BE COMPLETED

## 2016-08-11 ENCOUNTER — Telehealth: Payer: Self-pay | Admitting: Physician Assistant

## 2016-08-11 ENCOUNTER — Ambulatory Visit: Payer: Commercial Managed Care - PPO | Admitting: Gastroenterology

## 2016-08-11 NOTE — Telephone Encounter (Signed)
[name] is returning a call . Please call .

## 2016-08-11 NOTE — Telephone Encounter (Signed)
Note at the front desk-pt will be right in to pick up

## 2016-08-14 ENCOUNTER — Encounter: Payer: Self-pay | Admitting: *Deleted

## 2016-08-15 ENCOUNTER — Other Ambulatory Visit (INDEPENDENT_AMBULATORY_CARE_PROVIDER_SITE_OTHER): Payer: Commercial Managed Care - PPO

## 2016-08-15 ENCOUNTER — Ambulatory Visit (INDEPENDENT_AMBULATORY_CARE_PROVIDER_SITE_OTHER): Payer: Commercial Managed Care - PPO | Admitting: Gastroenterology

## 2016-08-15 ENCOUNTER — Telehealth: Payer: Self-pay | Admitting: *Deleted

## 2016-08-15 ENCOUNTER — Encounter (INDEPENDENT_AMBULATORY_CARE_PROVIDER_SITE_OTHER): Payer: Self-pay

## 2016-08-15 ENCOUNTER — Encounter: Payer: Self-pay | Admitting: Gastroenterology

## 2016-08-15 VITALS — BP 124/60 | HR 68 | Ht 67.0 in | Wt 175.2 lb

## 2016-08-15 DIAGNOSIS — Z7901 Long term (current) use of anticoagulants: Secondary | ICD-10-CM

## 2016-08-15 DIAGNOSIS — K921 Melena: Secondary | ICD-10-CM

## 2016-08-15 DIAGNOSIS — D62 Acute posthemorrhagic anemia: Secondary | ICD-10-CM | POA: Diagnosis not present

## 2016-08-15 LAB — CBC WITH DIFFERENTIAL/PLATELET
BASOS PCT: 0 % (ref 0.0–3.0)
Basophils Absolute: 0 10*3/uL (ref 0.0–0.1)
EOS PCT: 3 % (ref 0.0–5.0)
Eosinophils Absolute: 0.2 10*3/uL (ref 0.0–0.7)
HEMATOCRIT: 32.1 % — AB (ref 36.0–46.0)
HEMOGLOBIN: 10 g/dL — AB (ref 12.0–15.0)
Lymphocytes Relative: 14.6 % (ref 12.0–46.0)
Lymphs Abs: 0.8 10*3/uL (ref 0.7–4.0)
MCHC: 31 g/dL (ref 30.0–36.0)
MCV: 76 fl — AB (ref 78.0–100.0)
MONO ABS: 0.6 10*3/uL (ref 0.1–1.0)
MONOS PCT: 10.9 % (ref 3.0–12.0)
Neutro Abs: 3.9 10*3/uL (ref 1.4–7.7)
Neutrophils Relative %: 71.5 % (ref 43.0–77.0)
Platelets: 380 10*3/uL (ref 150.0–400.0)
RBC: 4.23 Mil/uL (ref 3.87–5.11)
RDW: 22.6 % — AB (ref 11.5–15.5)
WBC: 5.5 10*3/uL (ref 4.0–10.5)

## 2016-08-15 NOTE — Patient Instructions (Addendum)
You have been scheduled for an endoscopy. Please follow written instructions given to you at your visit today. If you use inhalers (even only as needed), please bring them with you on the day of your procedure. Your physician has requested that you go to www.startemmi.com and enter the access code given to you at your visit today. This web site gives a general overview about your procedure. However, you should still follow specific instructions given to you by our office regarding your preparation for the procedure.  Your physician has requested that you go to the basement for the following lab work before leaving today: CBC  You have been scheduled for a CT Virtual Colonoscopy at [facility] [address] on 09/15/16 @ 8:00 am. Please arrive at 7:40 am for registration. Please go by [facility] within the next several days to pick up prep for this test.

## 2016-08-15 NOTE — Telephone Encounter (Signed)
   [name] 1951-05-21 [id number]  Dear Rennis Golden:  We have scheduled the above named patient for a(n) endoscopy procedure. Our records show that (s)he is on anticoagulation therapy, however, we plan to keep her on both her Plavix and Coumadin for the procedure.  Please provide cardiac clearance for patient to have the procedure on 10/05/16 as currently scheduled.  Please route your response to Vernia Buff, CMA or fax response to [phone].  Sincerely,   [facility]

## 2016-08-15 NOTE — Progress Notes (Signed)
Complicated patient reviewed with physician assistant. Sounds like interval upper GI bleed. Rule out ulcers or AVMs. Rule out right-sided colonic neoplasia. Interventions limited due to the need for dual anticoagulation currently. Needs empiric PPI which she has not been on. Diagnostic upper endoscopy. Iron supplement. CT colonoscopy to rule out mass lesion.

## 2016-08-15 NOTE — Progress Notes (Signed)
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  She is being sent here today as a new patient at the request of her PCP, Dr. Clelia Croft, for evaluation of anemia and black stools.  Had black stools x 2 weeks after starting the Plavix although those have resolved.  Recently had heme negative stool.  Had a 5 gram drop in Hgb over the course of a month.  Now s/p PRBC's recently.  Was on ASA 81 mg daily as well but that was stopped last week.  Has been taking pantoprazole 40 mg daily recently.  Overall feeling well.  Never saw GI in the past or had a colonoscopy.  Past Medical History:  Diagnosis Date  . Anginal pain (HCC)   . Coronary artery disease    mech AVR, CABG  . Hyperlipidemia   . Hypertension   . Non-STEMI (non-ST elevated myocardial infarction) (HCC)   . RA (rheumatoid arthritis) (HCC)   . S/P AVR (aortic valve replacement) 08/1999   St. Jude AVR for aortic insuff, Goal INR 2.5-3.5  . 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 Donata Clay) - complicated by pericardial hematoma --> subxiphoid pericardial exposure and evacuation b. Cath 06/2016: DES to SVG-LAD ostium  . Sarcoid (HCC)   . Severe anemia   . Shortness of breath   . Subclavian artery stenosis (HCC) 06/2000   left subclavian bypass   Past Surgical History:  Procedure Laterality Date  . AORTIC VALVE REPLACEMENT  08/23/1999   39mm St. Jude, Bentall procedure, root replacemtn   . CARDIAC CATHETERIZATION  08/1999   normal L main, LAD & first diagonal are normal, normal Cfx, RCA with mild ostial narrowing, normal LV systolic function, severe AI, mild MR, normal PAP (Dr. Mervyn Skeeters. [place])   . CARDIAC CATHETERIZATION N/A 06/22/2016   Procedure: Left Heart Cath and  Cors/Grafts Angiography;  Surgeon: Marykay Lex, MD;  Location: [facility] INVASIVE CV LAB;  Service: Cardiovascular;  Laterality: N/A;  . CARDIAC CATHETERIZATION N/A 06/23/2016   Procedure: Left Heart Cath and Coronary Angiography;  Surgeon: Tonny Bollman, MD;  Location: [facility] INVASIVE CV LAB;  Service: Cardiovascular;  Laterality: N/A;  . CARDIAC CATHETERIZATION N/A 06/23/2016   Procedure: Coronary Stent Intervention;  Surgeon: Tonny Bollman, MD;  Location: [facility] INVASIVE CV LAB;  Service: Cardiovascular;  Laterality: N/A;  . CORONARY ARTERY BYPASS GRAFT  11/05/2012   Procedure: CORONARY ARTERY BYPASS GRAFTING (CABG);  Surgeon: Kerin Perna, MD;  Location: [facility] OR;  Service: Open Heart Surgery;  Laterality: N/A;  cannulate right subclavian  . ENDOVEIN HARVEST OF GREATER SAPHENOUS VEIN  11/05/2012   Procedure: ENDOVEIN HARVEST OF GREATER SAPHENOUS VEIN;  Surgeon: Kerin Perna, MD;  Location: [facility] OR;  Service: Open Heart Surgery;  Laterality: Right;  . INTRAOPERATIVE TRANSESOPHAGEAL ECHOCARDIOGRAM  11/05/2012   Procedure: INTRAOPERATIVE TRANSESOPHAGEAL ECHOCARDIOGRAM;  Surgeon: Kerin Perna, MD;  Location: [facility] OR;  Service: Open Heart Surgery;  Laterality: N/A;  . INTRAOPERATIVE TRANSESOPHAGEAL ECHOCARDIOGRAM N/A 11/21/2012   Procedure: INTRAOPERATIVE TRANSESOPHAGEAL ECHOCARDIOGRAM;  Surgeon: Kerin Perna, MD;  Location: [facility] OR;  Service: Open Heart Surgery;  Laterality: N/A;  . LEFT HEART CATHETERIZATION WITH CORONARY ANGIOGRAM N/A 11/04/2012  Procedure: LEFT HEART CATHETERIZATION WITH CORONARY ANGIOGRAM;  Surgeon: Chrystie Nose, MD;  Location: [facility] CATH LAB;  Service: Cardiovascular;  Laterality: N/A;  . MEDIASTINOSCOPY  08/2005   bronchoscopy & mediastinoscopy for mediastinal adenopathy (Dr. Donata Clay)   . NM MYOCAR PERF WALL MOTION  01/2012   lexiscan; small, fixed apical septal breast attenuation artifact & inferolateral bowel attenuation, no reversible ischemia, post-stress EF 64%, low risk scan   .  PERICARDIAL WINDOW N/A 11/21/2012   Procedure: PERICARDIAL WINDOW;  Surgeon: Kerin Perna, MD;  Location: [facility] OR;  Service: Thoracic;  Laterality: N/A;  . REPLACEMENT ASCENDING AORTA  11/05/2012   Procedure: REPLACEMENT ASCENDING AORTA;  Surgeon: Kerin Perna, MD;  Location: [facility] OR;  Service: Open Heart Surgery;  Laterality: N/A;  . SUBCLAVIAN ANGIOGRAM  05/10/2000   left subclavian arteriogram; patent graft, mild stenosis (Dr. Amada Kingfisher)   . SUBCLAVIAN BYPASS GRAFT  05/16/1999   left subclavian bypass with 28mm Darcon graft (Dr. Amada Kingfisher)  . TRANSTHORACIC ECHOCARDIOGRAM  11/20/2012   EF 65-70%, mild conc LVH, grade 2 diastolic dysfunction, mech AV, mild MR    reports that she has never smoked. She has never used smokeless tobacco. She reports that she does not drink alcohol or use drugs. family history includes Hypertension in her mother. Allergies  Allergen Reactions  . Contrast Media [Iodinated Diagnostic Agents] Hives, Itching and Swelling      Outpatient Encounter Prescriptions as of 08/15/2016  Medication Sig  . aspirin EC 81 MG EC tablet Take 1 tablet (81 mg total) by mouth daily.  Marland Kitchen atorvastatin (LIPITOR) 80 MG tablet Take 1 tablet (80 mg total) by mouth daily at 6 PM.  . clopidogrel (PLAVIX) 75 MG tablet Take 1 tablet (75 mg total) by mouth daily with breakfast.  . isosorbide mononitrate (IMDUR) 30 MG 24 hr tablet Take 1/2 tablet by mouth daily for one week. Then switch to 1 tablet daily. (Patient taking differently: Take 15 mg by mouth daily. Take 1/2 tablet by mouth daily for one week. Then switch to 1 tablet daily.)  . metoprolol tartrate (LOPRESSOR) 25 MG tablet Take 12.5 mg by mouth 2 (two) times daily.  . Multiple Vitamin (MULTIVITAMIN WITH MINERALS) TABS tablet Take 1 tablet by mouth daily.  . Multiple Vitamins-Minerals (MULTIVITAMIN WITH MINERALS) tablet Take 1 tablet by mouth daily.  . nitroGLYCERIN (NITROSTAT) 0.4 MG SL tablet Place 1 tablet (0.4 mg total) under the tongue  every 5 (five) minutes as needed for chest pain.  . pantoprazole (PROTONIX) 40 MG tablet Take 1 tablet (40 mg total) by mouth daily.  . polyvinyl alcohol (ARTIFICIAL TEARS) 1.4 % ophthalmic solution Place 1-2 drops into both eyes daily as needed for dry eyes.  Marland Kitchen warfarin (COUMADIN) 5 MG tablet TAKE 1 TO 1 AND 1/2 TABLET BY MOUTH EVERY DAY AS DIRECTED BY COUMADIN CLINIC (Patient taking differently: Take 0.5(2.5mg ) tablet on Monday and Thursday, all other days 1(5mg )  tablet)   No facility-administered encounter medications on file as of 08/15/2016.      REVIEW OF SYSTEMS  : All other systems reviewed and negative except where noted in the History of Present Illness.   PHYSICAL EXAM: BP 124/60   Pulse 68   Ht 5\' 7"  (1.702 m)   Wt 175 lb 3.2 oz (79.5 kg)   BMI 27.44 kg/m  General: Well developed black female in no acute distress Head: Normocephalic and atraumatic Eyes:  Sclerae anicteric, conjunctiva pink. Ears: Normal auditory acuity Lungs: Clear  throughout to auscultation Heart: Regular rate and rhythm.  Mechanical valve sounds noted. Abdomen: Soft, non-distended.  Normal bowel sounds.  Non-tender. Musculoskeletal: Symmetrical with no gross deformities  Skin: No lesions on visible extremities Extremities: No edema  Neurological: Alert oriented x 4, grossly non-focal Psychological:  Alert and cooperative. 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  Had black stools x 2 weeks after starting the Plavix although those have resolved.  Had a 5 gram drop in Hgb over the course of a month.  Now s/p PRBC's.  Discussed with Dr. Marina Goodell.  Will plan for EGD just as a "look and see" while on her Plavix and coumadin (needs cardiac clearance from Dr. Rennis Golden).  Will also plan for CT colonography as well for now.  Will repeat CBC today.  Must continue  her pantoprazole 40 mg daily.  *The risks, benefits, and alternatives to EGD were discussed with the patient and she consents to proceed.   CC:  Azalee Course, [place]

## 2016-08-16 NOTE — Telephone Encounter (Signed)
Clearance sent to Dr. Marina Goodell.  Dr. Rexene Edison

## 2016-08-17 NOTE — Telephone Encounter (Signed)
Noted  

## 2016-08-21 ENCOUNTER — Telehealth: Payer: Self-pay | Admitting: Physician Assistant

## 2016-08-21 ENCOUNTER — Encounter: Payer: Self-pay | Admitting: Internal Medicine

## 2016-08-21 NOTE — Telephone Encounter (Addendum)
Pt needs a new letter stating she is ABLE to go back to work, she was seen in the hospital -pls call when ready-will pick up [phone]

## 2016-08-21 NOTE — Telephone Encounter (Signed)
Returned call and left msg requesting patient confirm date she needs to go back. We have 11/27 from previous documentation- want to verify she is not requesting changes.

## 2016-08-21 NOTE — Telephone Encounter (Signed)
New message  Pt states verbage needs to be corrected on back to work note  Note must state "patient will be able to go back to work on 11/27"  Please call pt and advise if she can pick this up today

## 2016-08-22 ENCOUNTER — Telehealth: Payer: Self-pay | Admitting: Physician Assistant

## 2016-08-22 ENCOUNTER — Encounter: Payer: Self-pay | Admitting: Physician Assistant

## 2016-08-22 NOTE — Telephone Encounter (Signed)
[name] is calling about the return to work note . Please call

## 2016-08-22 NOTE — Telephone Encounter (Signed)
Patient aware letter at front desk for pick up

## 2016-08-22 NOTE — Telephone Encounter (Signed)
No answer when I called patient. Routed to provider to advise on return to work date. Looks like she was supposed to remain out of work until 11/27 - OK to return to work then at full duty or will she require work restrictions?

## 2016-08-22 NOTE — Telephone Encounter (Signed)
F/u Message ° °Pt returning RN call. Please call back to discuss  °

## 2016-08-22 NOTE — Telephone Encounter (Signed)
Called to inform pt that her letter to return to work is available to be picked up or mailed. LMTCB.

## 2016-09-15 ENCOUNTER — Inpatient Hospital Stay: Admission: RE | Admit: 2016-09-15 | Payer: Commercial Managed Care - PPO | Source: Ambulatory Visit

## 2016-09-22 ENCOUNTER — Telehealth: Payer: Self-pay | Admitting: Pharmacist Clinician (PhC)/ Clinical Pharmacy Specialist

## 2016-09-22 ENCOUNTER — Other Ambulatory Visit: Payer: Self-pay | Admitting: Internal Medicine

## 2016-09-22 ENCOUNTER — Ambulatory Visit (INDEPENDENT_AMBULATORY_CARE_PROVIDER_SITE_OTHER): Payer: Commercial Managed Care - PPO | Admitting: Pharmacist Clinician (PhC)/ Clinical Pharmacy Specialist

## 2016-09-22 DIAGNOSIS — Z7901 Long term (current) use of anticoagulants: Secondary | ICD-10-CM

## 2016-09-22 DIAGNOSIS — Z952 Presence of prosthetic heart valve: Secondary | ICD-10-CM | POA: Diagnosis not present

## 2016-09-22 DIAGNOSIS — Z1231 Encounter for screening mammogram for malignant neoplasm of breast: Secondary | ICD-10-CM

## 2016-09-22 LAB — POCT INR: INR: 2.9

## 2016-09-22 NOTE — Telephone Encounter (Signed)
Patient would like to get a handicapped parking permit

## 2016-09-27 ENCOUNTER — Telehealth: Payer: Self-pay | Admitting: Internal Medicine

## 2016-09-27 ENCOUNTER — Telehealth: Payer: Self-pay

## 2016-09-27 NOTE — Telephone Encounter (Signed)
Faxed request for cardiac clearance to Dr. Rennis Golden per Cathlyn Parsons.

## 2016-09-27 NOTE — Telephone Encounter (Signed)
Letter copied from chart review - letters tab Also received via fax Routed to MD for review/advice   09/27/2016   RE: [name] DOB: Dec 03, 1950 MRN: [id number]    Dear Dr. Rennis Golden,    We have scheduled the above patient for an endoscopic procedure on 10/05/2016.  Our records show that she had a heart attack in 06/2016.  The anesthesiologist would like cardiac clearance in light of that recent event.   Please advise if it is ok for patient to proceed with scheduled colonoscopy.  Please fax back/ or route the completed form to [facility] at [phone].   Sincerely,    Gracy Racer

## 2016-09-28 NOTE — Telephone Encounter (Signed)
Clearance faxed to [phone] via Epic.

## 2016-09-28 NOTE — Telephone Encounter (Signed)
New Message   Calling to speak with Dr. Rennis Golden nurse for surgical clearance.

## 2016-09-28 NOTE — Telephone Encounter (Signed)
She is cleared for upper endoscopy from a cardiac standpoint but Plavix and coumadin should be continued for procedure.   Peter Swaziland MD, [facility]

## 2016-09-29 NOTE — Telephone Encounter (Signed)
See 08-15-16 telephone note and note from Dr Rennis Golden under "letters" dated 08/16/16 at which time patient was already cleared for procedure.

## 2016-09-29 NOTE — Telephone Encounter (Signed)
Received fax. Routed to anesthesiologist.

## 2016-10-03 ENCOUNTER — Encounter: Payer: Self-pay | Admitting: *Deleted

## 2016-10-03 NOTE — Telephone Encounter (Signed)
Continue with saline and humidifier; advise not to blow her nose for 3-4 days, as this will cause bleeding to resume.

## 2016-10-03 NOTE — Telephone Encounter (Signed)
Returned call to patient.  She is having a nosebleed - she has a cold Nosebleed has been going on all morning She is going to get saline drops Advised using a humidifier  Will route to pharmacy for further advice as patient is on coumadin

## 2016-10-03 NOTE — Telephone Encounter (Signed)
LM with info provided per pharmacy staff

## 2016-10-03 NOTE — Telephone Encounter (Signed)
New Message   Patient was told to call and inform nurse that nose is bleeding on one side, and she is going to get saline drops. Patient is on coumadin.

## 2016-10-04 NOTE — Telephone Encounter (Signed)
Handicap label printed and will be signed when MD is back in the office

## 2016-10-04 NOTE — Telephone Encounter (Signed)
Were you able to get her a handicapped parking permit?

## 2016-10-05 ENCOUNTER — Encounter: Payer: Commercial Managed Care - PPO | Admitting: Internal Medicine

## 2016-10-05 NOTE — Telephone Encounter (Signed)
LM for patient that handicap parking permit form was signed by MD (criteria: cannot walk 200 feet without stopping to rest, has a cardiac condition to the extent that the person's functional limitations are classified in severity as Class III or Class IV according to standards set by the [place] [employer])  Form placed at front desk

## 2016-10-18 ENCOUNTER — Other Ambulatory Visit: Payer: Self-pay | Admitting: Internal Medicine

## 2016-10-20 ENCOUNTER — Ambulatory Visit
Admission: RE | Admit: 2016-10-20 | Discharge: 2016-10-20 | Disposition: A | Discharge status: Home / Self Care | Payer: Commercial Managed Care - PPO | Source: Ambulatory Visit | Attending: Internal Medicine | Admitting: Internal Medicine

## 2016-10-20 DIAGNOSIS — Z1231 Encounter for screening mammogram for malignant neoplasm of breast: Secondary | ICD-10-CM

## 2016-11-03 ENCOUNTER — Ambulatory Visit (INDEPENDENT_AMBULATORY_CARE_PROVIDER_SITE_OTHER): Payer: Commercial Managed Care - PPO | Admitting: Pharmacist

## 2016-11-03 DIAGNOSIS — Z7901 Long term (current) use of anticoagulants: Secondary | ICD-10-CM

## 2016-11-03 DIAGNOSIS — Z952 Presence of prosthetic heart valve: Secondary | ICD-10-CM

## 2016-11-03 LAB — POCT INR: INR: 2.8

## 2016-11-15 ENCOUNTER — Other Ambulatory Visit: Payer: Self-pay | Admitting: Internal Medicine

## 2016-11-16 ENCOUNTER — Encounter (HOSPITAL_COMMUNITY): Payer: Self-pay | Admitting: Internal Medicine

## 2016-11-16 NOTE — Progress Notes (Signed)
Mailed letter with Cardiac Rehab Program to pt ... KJ  °

## 2016-12-18 IMAGING — NM NM MISC PROCEDURE
6 series · 36 of 36 positions shown · non-contrast
Comparison: none

[Series 1: wbr rest · 6.40mm/px · 6 of 64 frames shown]
[frame 6/64]
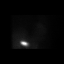
[frame 16/64]
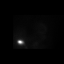
[frame 27/64]
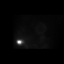
[frame 38/64]
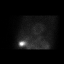
[frame 48/64]
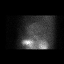
[frame 59/64]
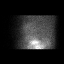

[Series 1: wbr_r-proj_st wbr rest · 6.40mm/px · 6 of 64 frames shown]
[frame 6/64]
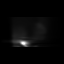
[frame 16/64]
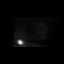
[frame 27/64]
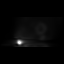
[frame 38/64]
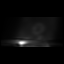
[frame 48/64]
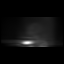
[frame 59/64]
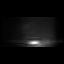

[Series 2: wbr stress-gsp · 6.40mm/px · 6 of 512 frames shown]
[frame 43/512]
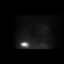
[frame 128/512]
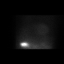
[frame 214/512]
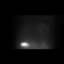
[frame 299/512]
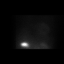
[frame 384/512]
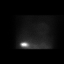
[frame 470/512]
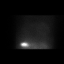

[Series 2: wbr_s-proj_st wbr stress-gsp · 6.40mm/px · 6 of 512 frames shown]
[frame 43/512]
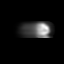
[frame 128/512]
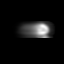
[frame 214/512]
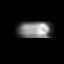
[frame 299/512]
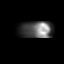
[frame 384/512]
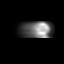
[frame 470/512]
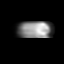

[Series 3: wbr_s-proj_st wbr stress-sum-em · 6.40mm/px · 6 of 64 frames shown]
[frame 6/64]
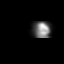
[frame 16/64]
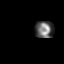
[frame 27/64]
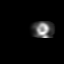
[frame 38/64]
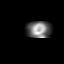
[frame 48/64]
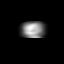
[frame 59/64]
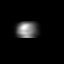

[Series 3: wbr stress-sum-em · 6.40mm/px · 6 of 64 frames shown]
[frame 6/64]
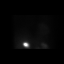
[frame 16/64]
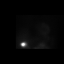
[frame 27/64]
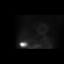
[frame 38/64]
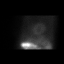
[frame 48/64]
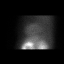
[frame 59/64]
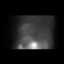

[36 of 36 positions shown; findings below may reference images not displayed]

Canned report from images found in remote index.

Refer to host system for actual result text.

## 2016-12-29 ENCOUNTER — Encounter (HOSPITAL_COMMUNITY): Payer: Self-pay | Admitting: Emergency Medicine

## 2016-12-29 ENCOUNTER — Emergency Department (HOSPITAL_COMMUNITY)
Admission: EM | Admit: 2016-12-29 | Discharge: 2016-12-29 | Disposition: A | Discharge status: Home / Self Care | Payer: Commercial Managed Care - PPO | Attending: Emergency Medicine | Admitting: Emergency Medicine

## 2016-12-29 DIAGNOSIS — I2510 Atherosclerotic heart disease of native coronary artery without angina pectoris: Secondary | ICD-10-CM | POA: Insufficient documentation

## 2016-12-29 DIAGNOSIS — Z951 Presence of aortocoronary bypass graft: Secondary | ICD-10-CM | POA: Diagnosis not present

## 2016-12-29 DIAGNOSIS — I252 Old myocardial infarction: Secondary | ICD-10-CM | POA: Diagnosis not present

## 2016-12-29 DIAGNOSIS — M62838 Other muscle spasm: Secondary | ICD-10-CM | POA: Diagnosis not present

## 2016-12-29 DIAGNOSIS — M542 Cervicalgia: Secondary | ICD-10-CM | POA: Diagnosis present

## 2016-12-29 DIAGNOSIS — M436 Torticollis: Secondary | ICD-10-CM | POA: Insufficient documentation

## 2016-12-29 DIAGNOSIS — I10 Essential (primary) hypertension: Secondary | ICD-10-CM | POA: Insufficient documentation

## 2016-12-29 DIAGNOSIS — Z79899 Other long term (current) drug therapy: Secondary | ICD-10-CM | POA: Diagnosis not present

## 2016-12-29 DIAGNOSIS — Z7982 Long term (current) use of aspirin: Secondary | ICD-10-CM | POA: Diagnosis not present

## 2016-12-29 DIAGNOSIS — Z7901 Long term (current) use of anticoagulants: Secondary | ICD-10-CM | POA: Diagnosis not present

## 2016-12-29 MED ORDER — ACETAMINOPHEN 325 MG PO TABS
650.0000 mg | ORAL_TABLET | Freq: Once | ORAL | Status: AC
  Administered 2016-12-29: 650 mg via ORAL
  Filled 2016-12-29: qty 2

## 2016-12-29 MED ORDER — DIAZEPAM 2 MG PO TABS: 2.0000 mg | ORAL_TABLET | Freq: Three times a day (TID) | ORAL | 0 refills | Status: DC | PRN

## 2016-12-29 MED ORDER — DICLOFENAC SODIUM 3 % TD GEL: 1.0000 "application " | Freq: Two times a day (BID) | TRANSDERMAL | 0 refills | Status: DC

## 2016-12-29 NOTE — ED Triage Notes (Signed)
Pt reports waking up with left neck and shoulder pain yesterday. Denies injury, states it is difficult to turn her head from side to side.

## 2016-12-29 NOTE — ED Provider Notes (Signed)
[facility]-EMERGENCY DEPT Provider Note   CSN: [id number] Arrival date & time: 12/29/16  1051  By signing my name below, I, Teofilo Pod, attest that this documentation has been prepared under the direction and in the presence of Everlene Farrier, PA-C. Electronically Signed: Teofilo Pod, ED Scribe. 12/29/2016. 11:38 AM.    History   Chief Complaint Chief Complaint  Patient presents with  . Neck Pain    The history is provided by the patient. No language interpreter was used.   HPI Comments:  [name] is a 66 y.o. female with PMHx of CAD, HLD and HTN who presents to the Emergency Department complaining of constant left sided neck pain since yesterday. Pt reports that she woke up with pain to her neck and left shoulder. She states that the pain is worse with moving her neck.  Pt used tylenol 500mg  at 1900 yesterday and 0100 and 0600 today, and has used IcyHot with no relief. Denies fever, visual changes, sore throat, trouble swallowing.   Past Medical History:  Diagnosis Date  . Anginal pain (HCC)   . Coronary artery disease    mech AVR, CABG  . Hyperlipidemia   . Hypertension   . Non-STEMI (non-ST elevated myocardial infarction) (HCC)   . RA (rheumatoid arthritis) (HCC)   . S/P AVR (aortic valve replacement) 08/1999   St. Jude AVR for aortic insuff, Goal INR 2.5-3.5  . 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 Donata Clay) - complicated by pericardial hematoma --> subxiphoid pericardial exposure and evacuation b. Cath 06/2016: DES to SVG-LAD ostium  . Sarcoid (HCC)   . Severe anemia   . Shortness of breath   . Subclavian artery stenosis (HCC) 06/2000   left subclavian bypass    Patient Active Problem List   Diagnosis Date Noted  . Acute blood loss anemia 08/15/2016  . Black stools 08/15/2016  . Aortic valve disease 07/03/2016  . Cardiomyopathy, ischemic 07/03/2016  . Hx of CABG   .  NSTEMI (non-ST elevated myocardial infarction) (HCC) 06/20/2016  . Acute pericardial effusion post op without tamponade - S/P subxiphoid pericaridal window 11/21/12 11/21/2012  . PVD , known Lt SCA stenosis 11/21/2012  . Pleural effusion, RT, post op 11/21/2012  . Unstable angina on admission 11/04/12 11/04/2012  . Coronary artery disease involving native coronary artery 11/04/2012  . S/P AVR (aortic valve replacement) 11/04/2012  . Dilated ascending aorta at cath 11/03/12 - S/P root replacement 11/05/12 11/04/2012  . Chronic anticoagulation for mechanical AVR 11/04/2012  . Contrast media allergy 11/04/2012  . Essential hypertension 11/04/2012  . Sarcoidosis (HCC) 11/04/2012    Past Surgical History:  Procedure Laterality Date  . AORTIC VALVE REPLACEMENT  08/23/1999   62mm St. Jude, [place] procedure, root replacemtn   . CARDIAC CATHETERIZATION  08/1999   normal L main, LAD & first diagonal are normal, normal Cfx, RCA with mild ostial narrowing, normal LV systolic function, severe AI, mild MR, normal PAP (Dr. Mervyn Skeeters. Little)   . CARDIAC CATHETERIZATION N/A 06/22/2016   Procedure: Left Heart Cath and Cors/Grafts Angiography;  Surgeon: Marykay Lex, MD;  Location: [facility] INVASIVE CV LAB;  Service: Cardiovascular;  Laterality: N/A;  . CARDIAC CATHETERIZATION N/A 06/23/2016   Procedure: Left Heart Cath and Coronary Angiography;  Surgeon: Tonny Bollman, MD;  Location: [facility] INVASIVE CV LAB;  Service: Cardiovascular;  Laterality: N/A;  . CARDIAC CATHETERIZATION N/A 06/23/2016  Procedure: Coronary Stent Intervention;  Surgeon: Tonny Bollman, MD;  Location: [facility] INVASIVE CV LAB;  Service: Cardiovascular;  Laterality: N/A;  . CORONARY ARTERY BYPASS GRAFT  11/05/2012   Procedure: CORONARY ARTERY BYPASS GRAFTING (CABG);  Surgeon: Kerin Perna, MD;  Location: [facility] OR;  Service: Open Heart Surgery;  Laterality: N/A;  cannulate right subclavian  . ENDOVEIN HARVEST OF GREATER SAPHENOUS VEIN  11/05/2012   Procedure:  ENDOVEIN HARVEST OF GREATER SAPHENOUS VEIN;  Surgeon: Kerin Perna, MD;  Location: [facility] OR;  Service: Open Heart Surgery;  Laterality: Right;  . INTRAOPERATIVE TRANSESOPHAGEAL ECHOCARDIOGRAM  11/05/2012   Procedure: INTRAOPERATIVE TRANSESOPHAGEAL ECHOCARDIOGRAM;  Surgeon: Kerin Perna, MD;  Location: [facility] OR;  Service: Open Heart Surgery;  Laterality: N/A;  . INTRAOPERATIVE TRANSESOPHAGEAL ECHOCARDIOGRAM N/A 11/21/2012   Procedure: INTRAOPERATIVE TRANSESOPHAGEAL ECHOCARDIOGRAM;  Surgeon: Kerin Perna, MD;  Location: [facility] OR;  Service: Open Heart Surgery;  Laterality: N/A;  . LEFT HEART CATHETERIZATION WITH CORONARY ANGIOGRAM N/A 11/04/2012   Procedure: LEFT HEART CATHETERIZATION WITH CORONARY ANGIOGRAM;  Surgeon: Chrystie Nose, MD;  Location: [facility] CATH LAB;  Service: Cardiovascular;  Laterality: N/A;  . MEDIASTINOSCOPY  08/2005   bronchoscopy & mediastinoscopy for mediastinal adenopathy (Dr. Donata Clay)   . NM MYOCAR PERF WALL MOTION  01/2012   lexiscan; small, fixed apical septal breast attenuation artifact & inferolateral bowel attenuation, no reversible ischemia, post-stress EF 64%, low risk scan   . PERICARDIAL WINDOW N/A 11/21/2012   Procedure: PERICARDIAL WINDOW;  Surgeon: Kerin Perna, MD;  Location: [facility] OR;  Service: Thoracic;  Laterality: N/A;  . REPLACEMENT ASCENDING AORTA  11/05/2012   Procedure: REPLACEMENT ASCENDING AORTA;  Surgeon: Kerin Perna, MD;  Location: [facility] OR;  Service: Open Heart Surgery;  Laterality: N/A;  . SUBCLAVIAN ANGIOGRAM  05/10/2000   left subclavian arteriogram; patent graft, mild stenosis (Dr. Amada Kingfisher)   . SUBCLAVIAN BYPASS GRAFT  05/16/1999   left subclavian bypass with 31mm Darcon graft (Dr. Amada Kingfisher)  . TRANSTHORACIC ECHOCARDIOGRAM  11/20/2012   EF 65-70%, mild conc LVH, grade 2 diastolic dysfunction, mech AV, mild MR    OB History    No data available       Home Medications    Prior to Admission medications   Medication Sig Start Date End Date Taking?  Authorizing Provider  aspirin EC 81 MG EC tablet Take 1 tablet (81 mg total) by mouth daily. 06/26/16   Ellsworth Lennox, PA  atorvastatin (LIPITOR) 80 MG tablet Take 1 tablet (80 mg total) by mouth daily at 6 PM. 06/25/16   Ellsworth Lennox, PA  clopidogrel (PLAVIX) 75 MG tablet Take 1 tablet (75 mg total) by mouth daily with breakfast. 06/26/16   Ellsworth Lennox, PA  diazepam (VALIUM) 2 MG tablet Take 1 tablet (2 mg total) by mouth every 8 (eight) hours as needed for muscle spasms. 12/29/16   Everlene Farrier, PA-C  Diclofenac Sodium 3 % GEL Place 1 application onto the skin 2 (two) times daily. To affected area. 12/29/16   Everlene Farrier, PA-C  isosorbide mononitrate (IMDUR) 30 MG 24 hr tablet Take 1/2 tablet by mouth daily for one week. Then switch to 1 tablet daily. Patient taking differently: Take 15 mg by mouth daily. Take 1/2 tablet by mouth daily for one week. Then switch to 1 tablet daily. 07/28/16   Rhonda G Barrett, PA-C  metoprolol tartrate (LOPRESSOR) 25 MG tablet Take 12.5 mg by mouth 2 (two) times daily.  Historical Provider, MD  metoprolol tartrate (LOPRESSOR) 25 MG tablet TAKE 0.5 TABLETS (12.5 MG TOTAL) BY MOUTH 2 (TWO) TIMES DAILY. 11/15/16   Chrystie Nose, MD  Multiple Vitamin (MULTIVITAMIN WITH MINERALS) TABS tablet Take 1 tablet by mouth daily.    Historical Provider, MD  Multiple Vitamins-Minerals (MULTIVITAMIN WITH MINERALS) tablet Take 1 tablet by mouth daily.    Historical Provider, MD  nitroGLYCERIN (NITROSTAT) 0.4 MG SL tablet Place 1 tablet (0.4 mg total) under the tongue every 5 (five) minutes as needed for chest pain. 06/25/16   Ellsworth Lennox, PA  pantoprazole (PROTONIX) 40 MG tablet Take 1 tablet (40 mg total) by mouth daily. 07/14/16   Azalee Course, PA  polyvinyl alcohol (ARTIFICIAL TEARS) 1.4 % ophthalmic solution Place 1-2 drops into both eyes daily as needed for dry eyes.    Historical Provider, MD  warfarin (COUMADIN) 5 MG tablet TAKE 1 TO 1 AND 1/2 TABLET BY  MOUTH EVERY DAY AS DIRECTED BY COUMADIN CLINIC 10/18/16   Chrystie Nose, MD    Family History Family History  Problem Relation Age of Onset  . Hypertension Mother   . Colon cancer Neg Hx   . Esophageal cancer Neg Hx   . Pancreatic cancer Neg Hx   . Stomach cancer Neg Hx   . Liver disease Neg Hx     Social History Social History  Substance Use Topics  . Smoking status: Never Smoker  . Smokeless tobacco: Never Used  . Alcohol use No     Allergies   Contrast media [iodinated diagnostic agents]   Review of Systems Review of Systems  Constitutional: Negative for chills and fever.  HENT: Negative for sore throat and trouble swallowing.   Eyes: Negative for pain and visual disturbance.  Cardiovascular: Negative for chest pain.  Musculoskeletal: Positive for myalgias and neck pain.  Skin: Negative for rash.  Neurological: Positive for headaches. Negative for dizziness, syncope, speech difficulty, weakness, light-headedness and numbness.     Physical Exam Updated Vital Signs BP (!) 169/60   Pulse 69   Temp 98.7 F (37.1 C) (Oral)   Resp 16   SpO2 100%   Physical Exam  Constitutional: She is oriented to person, place, and time. She appears well-developed and well-nourished. No distress.  Non-toxic appearing.   HENT:  Head: Normocephalic and atraumatic.  Right Ear: External ear normal.  Left Ear: External ear normal.  Mouth/Throat: Oropharynx is clear and moist. No oropharyngeal exudate.  Throat is clear.   Eyes: Conjunctivae are normal. Pupils are equal, round, and reactive to light. Right eye exhibits no discharge. Left eye exhibits no discharge.  Neck: Neck supple. No JVD present. No tracheal deviation present.  Tenderness along left trapezius musculature which appears to be in spasm. Able to place chin to chest and look up to ceiling.  Able to rotate head >45 degrees in each direction, more pain when turning to the left.  No meningeal signs.   Cardiovascular:  Normal rate, regular rhythm, normal heart sounds and intact distal pulses.   Bilateral radial pulses are intact.   Pulmonary/Chest: Effort normal and breath sounds normal. No stridor. No respiratory distress.  Abdominal: Soft. There is no tenderness.  Musculoskeletal: She exhibits tenderness. She exhibits no deformity.  Tenderness along left trapezius musculature which appears to be in spasm. Able to place chin to chest. Able to rotate head >45 degrees in each direction, less when turning to the left.   Lymphadenopathy:    She has  no cervical adenopathy.  Neurological: She is alert and oriented to person, place, and time. No cranial nerve deficit or sensory deficit. She exhibits normal muscle tone. Coordination normal.  Skin: Skin is warm and dry. Capillary refill takes less than 2 seconds. No rash noted. She is not diaphoretic. No erythema. No pallor.  Psychiatric: She has a normal mood and affect. Her behavior is normal.  Nursing note and vitals reviewed.    ED Treatments / Results  DIAGNOSTIC STUDIES:  Oxygen Saturation is 100% on RA, normal by my interpretation.    COORDINATION OF CARE:  11:34 AM Will provide Valium. Discussed treatment plan with pt at bedside and pt agreed to plan.   Labs (all labs ordered are listed, but only abnormal results are displayed) Labs Reviewed - No data to display  EKG  EKG Interpretation None       Radiology No results found.  Procedures Procedures (including critical care time)  Medications Ordered in ED Medications  acetaminophen (TYLENOL) tablet 650 mg (not administered)     Initial Impression / Assessment and Plan / ED Course  I have reviewed the triage vital signs and the nursing notes.  Pertinent labs & imaging results that were available during my care of the patient were reviewed by me and considered in my medical decision making (see chart for details).     This  is a 66 y.o. female with PMHx of CAD, HLD and HTN who  presents to the Emergency Department complaining of constant left sided neck pain since yesterday. On exam the patient is afebrile nontoxic appearing. She has tenderness along her left trapezius musculature. No meningeal signs. She has good range of motion of her neck, albeit with pain. Patient with trapezius muscle spasm. Patient is driving today. Will provide with Tylenol and ice in the emergency department and discharged with a short course of Valium for muscle spasm. I also encouraged her to use Tylenol at home and diclofenac gel. Discussed neck exercises. Return precautions discussed. I advised the patient to follow-up with their primary care provider this week. I advised the patient to return to the emergency department with new or worsening symptoms or new concerns. The patient verbalized understanding and agreement with plan.     Final Clinical Impressions(s) / ED Diagnoses   Final diagnoses:  Torticollis, acute  Trapezius muscle spasm    New Prescriptions New Prescriptions   DIAZEPAM (VALIUM) 2 MG TABLET    Take 1 tablet (2 mg total) by mouth every 8 (eight) hours as needed for muscle spasms.   DICLOFENAC SODIUM 3 % GEL    Place 1 application onto the skin 2 (two) times daily. To affected area.    I personally performed the services described in this documentation, which was scribed in my presence. The recorded information has been reviewed and is accurate.      Everlene Farrier, PA-C 12/29/16 1148    Mancel Bale, MD 12/29/16 [phone]

## 2017-01-04 ENCOUNTER — Telehealth: Payer: Self-pay | Admitting: Internal Medicine

## 2017-01-04 ENCOUNTER — Other Ambulatory Visit: Payer: Self-pay

## 2017-01-04 ENCOUNTER — Ambulatory Visit (HOSPITAL_COMMUNITY): Payer: Commercial Managed Care - PPO | Attending: Cardiovascular Disease

## 2017-01-04 DIAGNOSIS — I340 Nonrheumatic mitral (valve) insufficiency: Secondary | ICD-10-CM | POA: Insufficient documentation

## 2017-01-04 DIAGNOSIS — I2510 Atherosclerotic heart disease of native coronary artery without angina pectoris: Secondary | ICD-10-CM | POA: Diagnosis not present

## 2017-01-04 DIAGNOSIS — E785 Hyperlipidemia, unspecified: Secondary | ICD-10-CM | POA: Insufficient documentation

## 2017-01-04 DIAGNOSIS — Z952 Presence of prosthetic heart valve: Secondary | ICD-10-CM | POA: Diagnosis not present

## 2017-01-04 DIAGNOSIS — I119 Hypertensive heart disease without heart failure: Secondary | ICD-10-CM | POA: Insufficient documentation

## 2017-01-04 DIAGNOSIS — D869 Sarcoidosis, unspecified: Secondary | ICD-10-CM | POA: Insufficient documentation

## 2017-01-04 DIAGNOSIS — I252 Old myocardial infarction: Secondary | ICD-10-CM | POA: Diagnosis not present

## 2017-01-04 DIAGNOSIS — I255 Ischemic cardiomyopathy: Secondary | ICD-10-CM | POA: Diagnosis not present

## 2017-01-04 DIAGNOSIS — I359 Nonrheumatic aortic valve disorder, unspecified: Secondary | ICD-10-CM | POA: Diagnosis present

## 2017-01-04 DIAGNOSIS — Z951 Presence of aortocoronary bypass graft: Secondary | ICD-10-CM | POA: Insufficient documentation

## 2017-01-04 NOTE — Telephone Encounter (Signed)
Patient returning a call,thanks. °

## 2017-01-05 NOTE — Telephone Encounter (Signed)
LMTCB

## 2017-01-05 NOTE — Telephone Encounter (Signed)
LMTCB   Notes recorded by Chrystie Nose, MD on 01/04/2017 at 4:09 PM EDT Normal systolic function, normally functioning mechanical aortic valve.  Dr. Rexene Edison

## 2017-01-05 NOTE — Telephone Encounter (Signed)
Patient returned call. Aware of echo results.

## 2017-01-12 ENCOUNTER — Encounter: Payer: Self-pay | Admitting: Internal Medicine

## 2017-01-12 ENCOUNTER — Ambulatory Visit (INDEPENDENT_AMBULATORY_CARE_PROVIDER_SITE_OTHER): Payer: Commercial Managed Care - PPO | Admitting: Pharmacist Clinician (PhC)/ Clinical Pharmacy Specialist

## 2017-01-12 ENCOUNTER — Ambulatory Visit (INDEPENDENT_AMBULATORY_CARE_PROVIDER_SITE_OTHER): Payer: Commercial Managed Care - PPO | Admitting: Internal Medicine

## 2017-01-12 VITALS — BP 96/50 | HR 69 | Ht 66.0 in | Wt 173.2 lb

## 2017-01-12 DIAGNOSIS — I2510 Atherosclerotic heart disease of native coronary artery without angina pectoris: Secondary | ICD-10-CM

## 2017-01-12 DIAGNOSIS — I441 Atrioventricular block, second degree: Secondary | ICD-10-CM

## 2017-01-12 DIAGNOSIS — Z952 Presence of prosthetic heart valve: Secondary | ICD-10-CM | POA: Diagnosis not present

## 2017-01-12 DIAGNOSIS — Z79899 Other long term (current) drug therapy: Secondary | ICD-10-CM | POA: Diagnosis not present

## 2017-01-12 DIAGNOSIS — R0600 Dyspnea, unspecified: Secondary | ICD-10-CM | POA: Insufficient documentation

## 2017-01-12 DIAGNOSIS — R0609 Other forms of dyspnea: Secondary | ICD-10-CM | POA: Diagnosis not present

## 2017-01-12 DIAGNOSIS — Z7901 Long term (current) use of anticoagulants: Secondary | ICD-10-CM | POA: Diagnosis not present

## 2017-01-12 LAB — POCT INR: INR: 2.9

## 2017-01-12 MED ORDER — ISOSORBIDE MONONITRATE ER 30 MG PO TB24: 15.0000 mg | ORAL_TABLET | Freq: Every day | ORAL | 5 refills | Status: DC

## 2017-01-12 NOTE — Progress Notes (Signed)
OFFICE NOTE  Chief Complaint:  Fatigue  Primary Care Physician: [name], MD  HPI:  [name] is a very pleasant 66 year old African American female with a history of St. Jude AVR placed in 2000 for aortic insufficiency. 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 She also has a history of sarcoidosis, dyslipidemia and contrast allergy, which apparently gave her hives. In April 2013, she saw Dr. Clarene Duke with complaints of chest pain, for which he then did a Myoview on her, which was normal. She was last seen by Corine Shelter on October 21, 2012. At that time she was complaining of having chest pressure and shortness of breath. She was set up for a diagnostic heart catheterization. This was completed by Dr. Rennis Golden, which revealed severe ostial left main stenosis greater than 95%. There was also suggestion of possible ostial RCA disease. The ascending aorta was dilated up to 4.8 cm. At that time, Dr. Donata Clay was asked to consult. Patient was then taken for bypass surgery x2 with a saphenous vein graft to the left anterior descending coronary artery and a saphenous vein graft to the circumflex marginal. 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 Also during that hospitalization, she had development of a pericardial hematoma. Dr. Donata Clay then did a subxiphoid pericardial exposure and evacuation of the pericardial hematoma. She was seen in follow-up in March 2014 and was doing well.  She had lost about 17 lbs during her hospitalization.  She reports returning to work this past summer and is still able to do most activities. Her INR has been followed in our Coumadin clinic and is actually supratherapeutic today at 4. Her dose will be adjusted. She's not had any further laboratory work or followup since this past Spring.  She denies any chest pain with exertion, but does report some discomfort associated with her anterior  scars as well as her sternal wires. She does occasionally get fatigued with work but this improves with rest.  I saw [name] back today in follow-up. She is doing fairly well. She denies any shortness of breath or chest pain. She is annoyed somewhat at her mechanical valve which she says is very loud. She underwent a CT scan this year in March which shows a stable thoracic aneurysm repair at 4.0 cm. 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 In the past she's had left subclavian bypass and will need ultrasound follow-up of that. She's also not had any lab work in the past several months.  [name] returns today for follow-up. Overall she seems to be doing pretty well. She says she gets hot and noted that her blood pressure is somewhat elevated from time to time now. It seems as if she's not currently on any blood pressure medicines. Last time I saw her she was not taking lisinopril and her blood pressure was fairly normal therefore I kept her off the medicine. Previous to that she was taken off of her beta blocker. She really has indication for either one of these are both based on her history of coronary disease, coronary artery bypass grafting, valve repair, and aneurysm. Blood pressure is elevated today. She is due for an INR check. Dopplers including AAA screen as well as ultrasound of her subclavian stenosis were performed in October last year and are stable.  07/03/2016  [name] returns today for follow-up. Unfortunately  she recently had some unstable angina and presented to the hospital with chest pain. She underwent cardiac catheterization after being found to have a NSTEMI with elevated troponin of 0.26. Warfarin was held and she was found to have an EF of 40-45% with moderate hypokinesis of the basal inferior myocardium and grade 2 diastolic dysfunction. Her mechanical heart valve was functioning normally. She then underwent cardiac  catheterization which demonstrated a patent SVG to LAD and SVG to OM1. There was 95% stenosis of an ostial left main, 90% ostial first marginal, her percent ostial RCA and 90% ostial LAD disease. Given the difficulty of the case, intervention was deferred until the next day when Dr. Excell Seltzer performed PCI to the ostium of the SVG to LAD. Selective angiography was not possible after the case due to the stent strut hanging out into the aorta however aortic root angiography showed normal flow down the graft and the patient was noted to be chest pain-free. He recommended aspirin, Plavix and warfarin for 3 months, then discontinuing aspirin and continuing warfarin and Plavix. Today she reports no further chest pain. She initially talked to me about disability however after more discussion she understood that if she were to become formally disabled she could never work again and she did want to continue to work part time in addition to having disability. Based on that she wishes to go back to work and she should be able to go back to work as early as October 9.  01/12/2017  [name] returns today for follow-up. She says she feels a bit stronger and does not feel palpitations is bad at night. EKG today however shows sinus rhythm with a second-degree AV block with a rate of 69. Blood pressures accordingly low 96/50. She is on low-dose metoprolol. She recently stopped aspirin is on Plavix and warfarin. She is due for an INR check today. She had a repeat echocardiogram which shows a normally functioning mechanical aortic valve as well as an improvement in EF up to 60-65%. She does report a little bit of fatigue particularly when walking around [place]. She is not significantly active and inoperative spitting cardigan rehabilitation due to her job. I encouraged her to start to do more physical activity as I do not see any cardiac limitation to her symptoms.  PMHx:  Past Medical History:  Diagnosis Date  . Anginal pain  (HCC)   . Coronary artery disease    mech AVR, CABG  . Hyperlipidemia   . Hypertension   . Non-STEMI (non-ST elevated myocardial infarction) (HCC)   . RA (rheumatoid arthritis) (HCC)   . S/P AVR (aortic valve replacement) 08/1999   St. Jude AVR for aortic insuff, Goal INR 2.5-3.5  . 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 Donata Clay) - complicated by pericardial hematoma --> subxiphoid pericardial exposure and evacuation b. Cath 06/2016: DES to SVG-LAD ostium  . Sarcoid (HCC)   . Severe anemia   . Shortness of breath   . 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 AORTIC VALVE REPLACEMENT  08/23/1999   98mm St. Jude, Bentall procedure, root replacemtn   . CARDIAC CATHETERIZATION  08/1999   normal L main, LAD & first diagonal are normal, normal Cfx, RCA with mild ostial narrowing, normal LV systolic function, severe AI, mild MR, normal PAP (Dr. Mervyn Skeeters.  Little)   . CARDIAC CATHETERIZATION N/A 06/22/2016   Procedure: Left Heart Cath and Cors/Grafts Angiography;  Surgeon: Marykay Lex, MD;  Location: [facility] INVASIVE CV LAB;  Service: Cardiovascular;  Laterality: N/A;  . CARDIAC CATHETERIZATION N/A 06/23/2016   Procedure: Left Heart Cath and Coronary Angiography;  Surgeon: Tonny Bollman, MD;  Location: [facility] INVASIVE CV LAB;  Service: Cardiovascular;  Laterality: N/A;  . CARDIAC CATHETERIZATION N/A 06/23/2016   Procedure: Coronary Stent Intervention;  Surgeon: Tonny Bollman, MD;  Location: [facility] INVASIVE CV LAB;  Service: Cardiovascular;  Laterality: N/A;  . CORONARY ARTERY BYPASS GRAFT  11/05/2012   Procedure: CORONARY ARTERY BYPASS GRAFTING (CABG);  Surgeon: Kerin Perna, MD;  Location: [facility] OR;  Service: Open Heart Surgery;  Laterality: N/A;  cannulate right subclavian  . ENDOVEIN HARVEST OF GREATER SAPHENOUS VEIN  11/05/2012   Procedure:  ENDOVEIN HARVEST OF GREATER SAPHENOUS VEIN;  Surgeon: Kerin Perna, MD;  Location: [facility] OR;  Service: Open Heart Surgery;  Laterality: Right;  . INTRAOPERATIVE TRANSESOPHAGEAL ECHOCARDIOGRAM  11/05/2012   Procedure: INTRAOPERATIVE TRANSESOPHAGEAL ECHOCARDIOGRAM;  Surgeon: Kerin Perna, MD;  Location: [facility] OR;  Service: Open Heart Surgery;  Laterality: N/A;  . INTRAOPERATIVE TRANSESOPHAGEAL ECHOCARDIOGRAM N/A 11/21/2012   Procedure: INTRAOPERATIVE TRANSESOPHAGEAL ECHOCARDIOGRAM;  Surgeon: Kerin Perna, MD;  Location: [facility] OR;  Service: Open Heart Surgery;  Laterality: N/A;  . LEFT HEART CATHETERIZATION WITH CORONARY ANGIOGRAM N/A 11/04/2012   Procedure: LEFT HEART CATHETERIZATION WITH CORONARY ANGIOGRAM;  Surgeon: Chrystie Nose, MD;  Location: [facility] CATH LAB;  Service: Cardiovascular;  Laterality: N/A;  . MEDIASTINOSCOPY  08/2005   bronchoscopy & mediastinoscopy for mediastinal adenopathy (Dr. Donata Clay)   . NM MYOCAR PERF WALL MOTION  01/2012   lexiscan; small, fixed apical septal breast attenuation artifact & inferolateral bowel attenuation, no reversible ischemia, post-stress EF 64%, low risk scan   . PERICARDIAL WINDOW N/A 11/21/2012   Procedure: PERICARDIAL WINDOW;  Surgeon: Kerin Perna, MD;  Location: [facility] OR;  Service: Thoracic;  Laterality: N/A;  . REPLACEMENT ASCENDING AORTA  11/05/2012   Procedure: REPLACEMENT ASCENDING AORTA;  Surgeon: Kerin Perna, MD;  Location: [facility] OR;  Service: Open Heart Surgery;  Laterality: N/A;  . SUBCLAVIAN ANGIOGRAM  05/10/2000   left subclavian arteriogram; patent graft, mild stenosis (Dr. Amada Kingfisher)   . SUBCLAVIAN BYPASS GRAFT  05/16/1999   left subclavian bypass with 14mm Darcon graft (Dr. Amada Kingfisher)  . TRANSTHORACIC ECHOCARDIOGRAM  11/20/2012   EF 65-70%, mild conc LVH, grade 2 diastolic dysfunction, mech AV, mild MR    FAMHx:  Family History  Problem Relation Age of Onset  . Hypertension Mother   . Colon cancer Neg Hx   . Esophageal cancer Neg Hx   .  Pancreatic cancer Neg Hx   . Stomach cancer Neg Hx   . Liver disease Neg Hx     SOCHx:   reports that she has never smoked. She has never used smokeless tobacco. She reports that she does not drink alcohol or use drugs.  ALLERGIES:  Allergies  Allergen Reactions  . Contrast Media [Iodinated Diagnostic Agents] Hives, Itching and Swelling    ROS: Pertinent items noted in HPI and remainder of comprehensive ROS otherwise negative.  HOME MEDS: Current Outpatient Prescriptions  Medication Sig Dispense Refill  . aspirin EC 81 MG EC tablet Take 1 tablet (81 mg total) by mouth daily.    Marland Kitchen atorvastatin (LIPITOR) 80 MG tablet Take 1 tablet (80 mg total) by mouth daily  at 6 PM. 30 tablet 6  . clopidogrel (PLAVIX) 75 MG tablet Take 1 tablet (75 mg total) by mouth daily with breakfast. 30 tablet 6  . diazepam (VALIUM) 2 MG tablet Take 1 tablet (2 mg total) by mouth every 8 (eight) hours as needed for muscle spasms. 10 tablet 0  . Diclofenac Sodium 3 % GEL Place 1 application onto the skin 2 (two) times daily. To affected area. 100 g 0  . isosorbide mononitrate (IMDUR) 30 MG 24 hr tablet Take 1/2 tablet by mouth daily for one week. Then switch to 1 tablet daily. (Patient taking differently: Take 15 mg by mouth daily. Take 1/2 tablet by mouth daily for one week. Then switch to 1 tablet daily.) 30 tablet 6  . metoprolol tartrate (LOPRESSOR) 25 MG tablet Take 12.5 mg by mouth 2 (two) times daily.    . Multiple Vitamin (MULTIVITAMIN WITH MINERALS) TABS tablet Take 1 tablet by mouth daily.    . nitroGLYCERIN (NITROSTAT) 0.4 MG SL tablet Place 1 tablet (0.4 mg total) under the tongue every 5 (five) minutes as needed for chest pain. 25 tablet 3  . pantoprazole (PROTONIX) 40 MG tablet Take 1 tablet (40 mg total) by mouth daily. 30 tablet 11  . polyvinyl alcohol (ARTIFICIAL TEARS) 1.4 % ophthalmic solution Place 1-2 drops into both eyes daily as needed for dry eyes.    Marland Kitchen warfarin (COUMADIN) 5 MG tablet TAKE 1  TO 1 AND 1/2 TABLET BY MOUTH EVERY DAY AS DIRECTED BY COUMADIN CLINIC 105 tablet 0   No current facility-administered medications for this visit.     LABS/IMAGING: No results found for this or any previous visit (from the past 48 hour(s)). No results found.  VITALS: BP (!) 96/50 (BP Location: Left Arm, Patient Position: Sitting, Cuff Size: Large)   Pulse 69   Ht 5\' 6"  (1.676 m)   Wt 173 lb 3.2 oz (78.6 kg)   BMI 27.96 kg/m   EXAM: General appearance: alert and no distress Neck: no carotid bruit and no JVD Lungs: clear to auscultation bilaterally Heart: regular rate and rhythm and sharp mechanical valve sounds Abdomen: soft, non-tender; bowel sounds normal; no masses,  no organomegaly Extremities: extremities normal, atraumatic, no cyanosis or edema Pulses: 2+ and symmetric Skin: Skin color, texture, turgor normal. No rashes or lesions Neurologic: Grossly normal Psych: Pleasant, normal  EKG: Deferred  ASSESSMENT: 1. Coronary artery disease status post two-vessel CABG in 11/2012 (LIMA to LAD, SVG to circumflex). 2. Recent NSTEMI - s/p PCI with DES to Ostial SVG-LAD stenosis (06/2016) 3. Ischemic cardiopathy EF 40-45% with inferior hypokinesis - improved to 60-65% (echo 12/2016) 4. 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 Status post pericardial window with hematoma evacuation 6. History of left subclavian stenosis status post bypass in 2001 7. Rheumatoid arthritis 8. Sarcoidosis 9. Dyslipidemia 10. Hypertension  PLAN: 1.   [name] is doing much better with improvement in LVEF up to 6065% by echo in April 2018. Mechanical aortic valve is working well. She is due for an INR rechecked today. She'll remain on Plavix and warfarin for 6 additional months at which time she'll discontinue Plavix and go back to aspirin and warfarin. She'll be due for repeat lipid profile in 6 months as well. I've encouraged her to increase her physical  activity as I think a lot of her fatigue and symptoms are related to deconditioning rather than any cardiac limitation.  Follow-up with me 6 months.  Chrystie Nose, MD, [facility] Attending Cardiologist [facility]  Chrystie Nose 01/12/2017, 8:50 AM

## 2017-01-12 NOTE — Patient Instructions (Addendum)
Medication Instructions:  STOP metoprolol REDUCE isosorbide dose to 1/2 tablet daily  Labwork: INR check today in office LIPIDS (fasting) in 6 months   Testing/Procedures: EKG in 1 week at [facility] office (scheduled with nurse)   Follow-Up: 6 months with Dr. Rennis Golden  If you need a refill on your cardiac medications before your next appointment, please call your pharmacy.

## 2017-01-17 ENCOUNTER — Other Ambulatory Visit: Payer: Self-pay | Admitting: Student

## 2017-01-19 ENCOUNTER — Ambulatory Visit (INDEPENDENT_AMBULATORY_CARE_PROVIDER_SITE_OTHER): Payer: Commercial Managed Care - PPO | Admitting: *Deleted

## 2017-01-19 VITALS — BP 136/74 | HR 77 | Wt 170.0 lb

## 2017-01-19 DIAGNOSIS — I2510 Atherosclerotic heart disease of native coronary artery without angina pectoris: Secondary | ICD-10-CM | POA: Diagnosis not present

## 2017-01-19 NOTE — Progress Notes (Signed)
1.) Reason for visit: EKG check  2.) Name of MD requesting visit: Dr. Rennis Golden  3.) H&P: CAD, s/p CABG, mechanical AV, ischemic cardiomyopathy, HTN, dyslipidemia, sarcoidosis, RA  4.) ROS related to problem: no complaints - EKG at 01/12/17 read as 2nd degree AVB.   -- metoprolol discontinued  -- weight 170lbs  -- BP 136/74  -- HR 73bpm  5.) Assessment and plan per MD: EKG performed by this RN & reviewed by DOD Dr. Duke Salvia as normal. EKG done on 01/12/17 reviewed and demonstrated PACs per MD.   No changes to medications/treatment plan necessary per Dr. Duke Salvia.

## 2017-02-23 ENCOUNTER — Ambulatory Visit (INDEPENDENT_AMBULATORY_CARE_PROVIDER_SITE_OTHER): Payer: Commercial Managed Care - PPO | Admitting: Pharmacist Clinician (PhC)/ Clinical Pharmacy Specialist

## 2017-02-23 DIAGNOSIS — Z952 Presence of prosthetic heart valve: Secondary | ICD-10-CM

## 2017-02-23 DIAGNOSIS — Z7901 Long term (current) use of anticoagulants: Secondary | ICD-10-CM

## 2017-02-23 LAB — POCT INR: INR: 3.3

## 2017-04-11 ENCOUNTER — Other Ambulatory Visit: Payer: Self-pay | Admitting: Physician Assistant

## 2017-04-16 ENCOUNTER — Telehealth: Payer: Self-pay | Admitting: Internal Medicine

## 2017-04-16 MED ORDER — NITROGLYCERIN 0.4 MG SL SUBL: 0.4000 mg | SUBLINGUAL_TABLET | SUBLINGUAL | 1 refills | Status: DC | PRN

## 2017-04-16 NOTE — Telephone Encounter (Signed)
New message    Pt is calling stating she lost her nitroglycerin tabs.    *STAT* If patient is at the pharmacy, call can be transferred to refill team.   1. Which medications need to be refilled? (please list name of each medication and dose if known) nitroglycerin 0.4 mg  2. Which pharmacy/location (including street and city if local pharmacy) is medication to be sent to? [facility]  3. Do they need a 30 day or 90 day supply? 30 day

## 2017-04-16 NOTE — Telephone Encounter (Signed)
Left msg to call if needed. Rx sent to requested pharmacy.

## 2017-07-09 ENCOUNTER — Other Ambulatory Visit: Payer: Self-pay | Admitting: Physician Assistant

## 2017-07-09 NOTE — Telephone Encounter (Signed)
Please review for refill, thanks ! 

## 2017-07-20 ENCOUNTER — Ambulatory Visit (INDEPENDENT_AMBULATORY_CARE_PROVIDER_SITE_OTHER): Payer: Commercial Managed Care - PPO | Admitting: Pharmacist Clinician (PhC)/ Clinical Pharmacy Specialist

## 2017-07-20 DIAGNOSIS — Z7901 Long term (current) use of anticoagulants: Secondary | ICD-10-CM | POA: Diagnosis not present

## 2017-07-20 DIAGNOSIS — Z952 Presence of prosthetic heart valve: Secondary | ICD-10-CM | POA: Diagnosis not present

## 2017-07-20 LAB — POCT INR: INR: 2.8

## 2017-07-23 ENCOUNTER — Other Ambulatory Visit: Payer: Self-pay | Admitting: *Deleted

## 2017-07-23 DIAGNOSIS — I714 Abdominal aortic aneurysm, without rupture, unspecified: Secondary | ICD-10-CM

## 2017-08-03 ENCOUNTER — Encounter: Payer: Self-pay | Admitting: Internal Medicine

## 2017-08-03 ENCOUNTER — Ambulatory Visit (INDEPENDENT_AMBULATORY_CARE_PROVIDER_SITE_OTHER): Payer: Commercial Managed Care - PPO | Admitting: Internal Medicine

## 2017-08-03 VITALS — BP 146/82 | HR 65 | Ht 66.0 in | Wt 165.0 lb

## 2017-08-03 DIAGNOSIS — I10 Essential (primary) hypertension: Secondary | ICD-10-CM | POA: Diagnosis not present

## 2017-08-03 DIAGNOSIS — Z7901 Long term (current) use of anticoagulants: Secondary | ICD-10-CM | POA: Diagnosis not present

## 2017-08-03 DIAGNOSIS — Z952 Presence of prosthetic heart valve: Secondary | ICD-10-CM | POA: Diagnosis not present

## 2017-08-03 MED ORDER — LISINOPRIL 10 MG PO TABS: 10.0000 mg | ORAL_TABLET | Freq: Every day | ORAL | 3 refills | Status: DC

## 2017-08-03 NOTE — Patient Instructions (Addendum)
Your physician has recommended you make the following change in your medication: START lisinopril 10mg once daily  Your physician wants you to follow-up in: 6 months with Dr. Hilty. You will receive a reminder letter in the mail two months in advance. If you don't receive a letter, please call our office to schedule the follow-up appointment.   

## 2017-08-03 NOTE — Progress Notes (Signed)
OFFICE NOTE  Chief Complaint:  Routine follow-up  Primary Care Physician: [name], MD  HPI:  [name] is a very pleasant 66 year old African American female with a history of St. Jude AVR placed in 2000 for aortic insufficiency. 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 She also has a history of sarcoidosis, dyslipidemia and contrast allergy, which apparently gave her hives. In April 2013, she saw Dr. Clarene Duke with complaints of chest pain, for which he then did a Myoview on her, which was normal. She was last seen by Corine Shelter on October 21, 2012. At that time she was complaining of having chest pressure and shortness of breath. She was set up for a diagnostic heart catheterization. This was completed by Dr. Rennis Golden, which revealed severe ostial left main stenosis greater than 95%. There was also suggestion of possible ostial RCA disease. The ascending aorta was dilated up to 4.8 cm. At that time, Dr. Donata Clay was asked to consult. Patient was then taken for bypass surgery x2 with a saphenous vein graft to the left anterior descending coronary artery and a saphenous vein graft to the circumflex marginal. 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 Also during that hospitalization, she had development of a pericardial hematoma. Dr. Donata Clay then did a subxiphoid pericardial exposure and evacuation of the pericardial hematoma. She was seen in follow-up in March 2014 and was doing well.  She had lost about 17 lbs during her hospitalization.  She reports returning to work this past summer and is still able to do most activities. Her INR has been followed in our Coumadin clinic and is actually supratherapeutic today at 4. Her dose will be adjusted. She's not had any further laboratory work or followup since this past Spring.  She denies any chest pain with exertion, but does report some discomfort associated with her  anterior scars as well as her sternal wires. She does occasionally get fatigued with work but this improves with rest.  I saw [name] back today in follow-up. She is doing fairly well. She denies any shortness of breath or chest pain. She is annoyed somewhat at her mechanical valve which she says is very loud. She underwent a CT scan this year in March which shows a stable thoracic aneurysm repair at 4.0 cm. 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 In the past she's had left subclavian bypass and will need ultrasound follow-up of that. She's also not had any lab work in the past several months.  [name] returns today for follow-up. Overall she seems to be doing pretty well. She says she gets hot and noted that her blood pressure is somewhat elevated from time to time now. It seems as if she's not currently on any blood pressure medicines. Last time I saw her she was not taking lisinopril and her blood pressure was fairly normal therefore I kept her off the medicine. Previous to that she was taken off of her beta blocker. She really has indication for either one of these are both based on her history of coronary disease, coronary artery bypass grafting, valve repair, and aneurysm. Blood pressure is elevated today. She is due for an INR check. Dopplers including AAA screen as well as ultrasound of her subclavian stenosis were performed in October last year and are stable.  07/03/2016  [name] returns today for follow-up.  Unfortunately she recently had some unstable angina and presented to the hospital with chest pain. She underwent cardiac catheterization after being found to have a NSTEMI with elevated troponin of 0.26. Warfarin was held and she was found to have an EF of 40-45% with moderate hypokinesis of the basal inferior myocardium and grade 2 diastolic dysfunction. Her mechanical heart valve was functioning normally. She then underwent cardiac  catheterization which demonstrated a patent SVG to LAD and SVG to OM1. There was 95% stenosis of an ostial left main, 90% ostial first marginal, her percent ostial RCA and 90% ostial LAD disease. Given the difficulty of the case, intervention was deferred until the next day when Dr. Excell Seltzer performed PCI to the ostium of the SVG to LAD. Selective angiography was not possible after the case due to the stent strut hanging out into the aorta however aortic root angiography showed normal flow down the graft and the patient was noted to be chest pain-free. He recommended aspirin, Plavix and warfarin for 3 months, then discontinuing aspirin and continuing warfarin and Plavix. Today she reports no further chest pain. She initially talked to me about disability however after more discussion she understood that if she were to become formally disabled she could never work again and she did want to continue to work part time in addition to having disability. Based on that she wishes to go back to work and she should be able to go back to work as early as October 9.  01/12/2017  [name] returns today for follow-up. She says she feels a bit stronger and does not feel palpitations is bad at night. EKG today however shows sinus rhythm with a second-degree AV block with a rate of 69. Blood pressures accordingly low 96/50. She is on low-dose metoprolol. She recently stopped aspirin is on Plavix and warfarin. She is due for an INR check today. She had a repeat echocardiogram which shows a normally functioning mechanical aortic valve as well as an improvement in EF up to 60-65%. She does report a little bit of fatigue particularly when walking around [place]. She is not significantly active and inoperative spitting cardigan rehabilitation due to her job. I encouraged her to start to do more physical activity as I do not see any cardiac limitation to her symptoms.   08/03/2017  [name] returns today for follow-up.  Overall she  is feeling well.  She occasionally gets some bruising.  She is currently on Plavix and warfarin.  Her INR was recently assessed.  She had a GI bleeding last fall but this is not recurred.  Her LVEF improved to 60-65% as of April 2018.  She denies any recurrent chest pain.  Her valve has been stable.  We did receive lab work from her PCP this summer which showed a well-controlled lipid profile as well as stable hemoglobin of 13.5.  She tells me that she plans to retire next year.  PMHx:  Past Medical History:  Diagnosis Date  . Anginal pain (HCC)   . Coronary artery disease    mech AVR, CABG  . Hyperlipidemia   . Hypertension   . Non-STEMI (non-ST elevated myocardial infarction) (HCC)   . RA (rheumatoid arthritis) (HCC)   . S/P AVR (aortic valve replacement) 08/1999   St. Jude AVR for aortic insuff, Goal INR 2.5-3.5  . 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 Donata Clay) -  complicated by pericardial hematoma --> subxiphoid pericardial exposure and evacuation b. Cath 06/2016: DES to SVG-LAD ostium  . Sarcoid   . Severe anemia   . Shortness of breath   . 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 AORTIC VALVE REPLACEMENT  08/23/1999   23mm St. Jude, Bentall procedure, root replacemtn   . CARDIAC CATHETERIZATION  08/1999   normal L main, LAD & first diagonal are normal, normal Cfx, RCA with mild ostial narrowing, normal LV systolic function, severe AI, mild MR, normal PAP (Dr. Mervyn SkeetersA. Little)   . CARDIAC CATHETERIZATION N/A 06/22/2016   Procedure: Left Heart Cath and Cors/Grafts Angiography;  Surgeon: Marykay Lexavid W Harding, MD;  [facility];  Service: Cardiovascular;  Laterality: N/A;  . CARDIAC CATHETERIZATION N/A 06/23/2016   Procedure: Left Heart Cath and Coronary Angiography;  Surgeon: Tonny BollmanMichael Cooper, MD;  [facility];  Service: Cardiovascular;  Laterality: N/A;  . CARDIAC CATHETERIZATION N/A 06/23/2016   Procedure: Coronary Stent Intervention;  Surgeon: Tonny BollmanMichael Cooper, MD;  [facility];  Service: Cardiovascular;  Laterality: N/A;  . CORONARY ARTERY BYPASS GRAFT  11/05/2012   Procedure: CORONARY ARTERY BYPASS GRAFTING (CABG);  Surgeon: Kerin PernaPeter Van Trigt, MD;  [facility];  Service: Open Heart Surgery;  Laterality: N/A;  cannulate right subclavian  . ENDOVEIN HARVEST OF GREATER SAPHENOUS VEIN  11/05/2012   Procedure: ENDOVEIN HARVEST OF GREATER SAPHENOUS VEIN;  Surgeon: Kerin PernaPeter Van Trigt, MD;  [facility];  Service: Open Heart Surgery;  Laterality: Right;  . INTRAOPERATIVE TRANSESOPHAGEAL ECHOCARDIOGRAM  11/05/2012   Procedure: INTRAOPERATIVE TRANSESOPHAGEAL ECHOCARDIOGRAM;  Surgeon: Kerin PernaPeter Van Trigt, MD;  [facility];  Service: Open Heart Surgery;  Laterality: N/A;  . INTRAOPERATIVE TRANSESOPHAGEAL ECHOCARDIOGRAM N/A 11/21/2012   Procedure: INTRAOPERATIVE TRANSESOPHAGEAL ECHOCARDIOGRAM;  Surgeon: Kerin PernaPeter Van Trigt, MD;  [facility];  Service: Open Heart Surgery;  Laterality: N/A;  . LEFT HEART CATHETERIZATION WITH CORONARY ANGIOGRAM N/A 11/04/2012   Procedure: LEFT HEART CATHETERIZATION WITH CORONARY ANGIOGRAM;  Surgeon: Chrystie NoseKenneth C. Greidys Deland, MD;  [facility];  Service: Cardiovascular;  Laterality: N/A;  . MEDIASTINOSCOPY  08/2005   bronchoscopy & mediastinoscopy for mediastinal adenopathy (Dr. Donata ClayVan Trigt)   . NM MYOCAR PERF WALL MOTION  01/2012   lexiscan; small, fixed apical septal breast attenuation artifact & inferolateral bowel attenuation, no reversible ischemia, post-stress EF 64%, low risk scan   . PERICARDIAL WINDOW N/A 11/21/2012   Procedure: PERICARDIAL WINDOW;  Surgeon: Kerin PernaPeter Van Trigt, MD;  [facility];  Service: Thoracic;  Laterality: N/A;  . REPLACEMENT ASCENDING AORTA  11/05/2012   Procedure: REPLACEMENT ASCENDING AORTA;  Surgeon: Kerin PernaPeter Van Trigt, MD;   [facility];  Service: Open Heart Surgery;  Laterality: N/A;  . SUBCLAVIAN ANGIOGRAM  05/10/2000   left subclavian arteriogram; patent graft, mild stenosis (Dr. Amada KingfisherG. Hayes)   . SUBCLAVIAN BYPASS GRAFT  05/16/1999   left subclavian bypass with 7mm Darcon graft (Dr. Amada KingfisherG. Hayes)  . TRANSTHORACIC ECHOCARDIOGRAM  11/20/2012   EF 65-70%, mild conc LVH, grade 2 diastolic dysfunction, mech AV, mild MR    FAMHx:  Family History  Problem Relation Age of Onset  . Hypertension Mother   . Colon cancer Neg Hx   . Esophageal cancer Neg Hx   . Pancreatic cancer Neg Hx   . Stomach cancer Neg Hx   . Liver disease Neg Hx  SOCHx:   reports that she has never smoked. She has never used smokeless tobacco. She reports that she does not drink alcohol or use drugs.  ALLERGIES:  Allergies  Allergen Reactions  . Contrast Media [Iodinated Diagnostic Agents] Hives, Itching and Swelling    ROS: Pertinent items noted in HPI and remainder of comprehensive ROS otherwise negative.  HOME MEDS: Current Outpatient Prescriptions  Medication Sig Dispense Refill  . aspirin EC 81 MG EC tablet Take 1 tablet (81 mg total) by mouth daily.    Marland Kitchen atorvastatin (LIPITOR) 80 MG tablet TAKE 1 TABLET (80 MG TOTAL) BY MOUTH DAILY AT 6 PM. 90 tablet 3  . Calcium Carb-Cholecalciferol (CALCIUM 600+D3 PO) Take 2,000 Units by mouth.    . clopidogrel (PLAVIX) 75 MG tablet TAKE 1 TABLET (75 MG TOTAL) BY MOUTH DAILY WITH BREAKFAST. 90 tablet 3  . diazepam (VALIUM) 2 MG tablet Take 1 tablet (2 mg total) by mouth every 8 (eight) hours as needed for muscle spasms. 10 tablet 0  . Diclofenac Sodium 3 % GEL Place 1 application onto the skin 2 (two) times daily. To affected area. 100 g 0  . isosorbide mononitrate (IMDUR) 30 MG 24 hr tablet Take 1 tablet (30 mg total) by mouth daily. 30 tablet 6  . Multiple Vitamin (MULTIVITAMIN WITH MINERALS) TABS tablet Take 1 tablet by mouth daily.    . nitroGLYCERIN (NITROSTAT) 0.4 MG SL tablet Place 1  tablet (0.4 mg total) under the tongue every 5 (five) minutes as needed for chest pain. 25 tablet 1  . pantoprazole (PROTONIX) 40 MG tablet TAKE 1 TABLET (40 MG TOTAL) BY MOUTH DAILY. 30 tablet 0  . polyvinyl alcohol (ARTIFICIAL TEARS) 1.4 % ophthalmic solution Place 1-2 drops into both eyes daily as needed for dry eyes.    Marland Kitchen warfarin (COUMADIN) 5 MG tablet TAKE 1 TO 1 AND 1/2 TABLET BY MOUTH EVERY DAY AS DIRECTED BY COUMADIN CLINIC 105 tablet 0  . lisinopril (PRINIVIL,ZESTRIL) 10 MG tablet Take 1 tablet (10 mg total) by mouth daily. 90 tablet 3   No current facility-administered medications for this visit.     LABS/IMAGING: No results found for this or any previous visit (from the past 48 hour(s)). No results found.  VITALS: BP (!) 146/82   Pulse 65   Ht 5\' 6"  (1.676 m)   Wt 165 lb (74.8 kg)   BMI 26.63 kg/m   EXAM: General appearance: alert and no distress Neck: no carotid bruit and no JVD Lungs: clear to auscultation bilaterally Heart: regular rate and rhythm and sharp mechanical valve sounds Abdomen: soft, non-tender; bowel sounds normal; no masses,  no organomegaly Extremities: extremities normal, atraumatic, no cyanosis or edema Pulses: 2+ and symmetric Skin: Skin color, texture, turgor normal. No rashes or lesions Neurologic: Grossly normal Psych: Pleasant, normal  EKG: Sinus rhythm with PACs at 65, moderate voltage criteria for LVH, nonspecific T wave changes-personally reviewed  ASSESSMENT: 1. Coronary artery disease status post two-vessel CABG in 11/2012 (LIMA to LAD, SVG to circumflex). 2. Recent NSTEMI - s/p PCI with DES to Ostial SVG-LAD stenosis (06/2016) 3. Ischemic cardiopathy EF 40-45% with inferior hypokinesis - improved to 60-65% (echo 12/2016) 4. 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 Status post pericardial window with hematoma evacuation 6. History of left subclavian stenosis status post bypass in  2001 7. Rheumatoid arthritis 8. Sarcoidosis 9. Dyslipidemia 10. Hypertension  PLAN: 1.   [name] feels well today.  Her recent  blood work from PCP shows a stable hemoglobin and cholesterol is at goal.  Her EF has improved up to 60-65%.  She is currently on Plavix and warfarin.  INR is been therapeutic.  We will plan to see her back in 6 months or sooner.  She unfortunate has been taken off of both her beta-blocker and ACE inhibitor.  Creatinine is been normal and blood pressure is elevated today.  I like to put her back on her lisinopril 10 mg daily.  Chrystie Nose, MD, [facility] Attending Cardiologist [facility]  Chrystie Nose 08/03/2017, 8:59 AM

## 2017-08-10 ENCOUNTER — Other Ambulatory Visit: Payer: Self-pay | Admitting: Internal Medicine

## 2017-08-28 ENCOUNTER — Other Ambulatory Visit: Payer: Self-pay | Admitting: Internal Medicine

## 2017-08-31 ENCOUNTER — Ambulatory Visit (INDEPENDENT_AMBULATORY_CARE_PROVIDER_SITE_OTHER): Payer: Commercial Managed Care - PPO | Admitting: Pharmacist Clinician (PhC)/ Clinical Pharmacy Specialist

## 2017-08-31 DIAGNOSIS — Z7901 Long term (current) use of anticoagulants: Secondary | ICD-10-CM

## 2017-08-31 DIAGNOSIS — Z952 Presence of prosthetic heart valve: Secondary | ICD-10-CM | POA: Diagnosis not present

## 2017-08-31 LAB — POCT INR: INR: 2.7

## 2017-09-16 ENCOUNTER — Other Ambulatory Visit: Payer: Self-pay | Admitting: Internal Medicine

## 2017-09-28 ENCOUNTER — Other Ambulatory Visit: Payer: Self-pay | Admitting: Internal Medicine

## 2017-09-28 DIAGNOSIS — Z1231 Encounter for screening mammogram for malignant neoplasm of breast: Secondary | ICD-10-CM

## 2017-10-12 ENCOUNTER — Ambulatory Visit (INDEPENDENT_AMBULATORY_CARE_PROVIDER_SITE_OTHER): Payer: Commercial Managed Care - PPO | Admitting: Pharmacist

## 2017-10-12 DIAGNOSIS — Z952 Presence of prosthetic heart valve: Secondary | ICD-10-CM

## 2017-10-12 DIAGNOSIS — Z7901 Long term (current) use of anticoagulants: Secondary | ICD-10-CM

## 2017-10-12 LAB — POCT INR: INR: 2.9

## 2017-10-19 ENCOUNTER — Other Ambulatory Visit: Payer: Self-pay | Admitting: Internal Medicine

## 2017-10-19 NOTE — Telephone Encounter (Signed)
New Message   *STAT* If patient is at the pharmacy, call can be transferred to refill team.   1. Which medications need to be refilled? (please list name of each medication and dose if known) Nitroglycerin 0.4  2. Which pharmacy/location (including street and city if local pharmacy) is medication to be sent to? [facility] [address]  3. Do they need a 30 day or 90 day supply? 90  Pt states she only has 4 tabs left.

## 2017-10-26 ENCOUNTER — Ambulatory Visit
Admission: RE | Admit: 2017-10-26 | Discharge: 2017-10-26 | Disposition: A | Discharge status: Home / Self Care | Payer: Commercial Managed Care - PPO | Source: Ambulatory Visit | Attending: Internal Medicine | Admitting: Internal Medicine

## 2017-10-26 DIAGNOSIS — Z1231 Encounter for screening mammogram for malignant neoplasm of breast: Secondary | ICD-10-CM

## 2017-11-15 ENCOUNTER — Other Ambulatory Visit: Payer: Self-pay | Admitting: Physician Assistant

## 2017-11-15 NOTE — Telephone Encounter (Signed)
Dr Hilty patient 

## 2017-11-15 NOTE — Telephone Encounter (Signed)
REFILL 

## 2017-11-16 ENCOUNTER — Ambulatory Visit (INDEPENDENT_AMBULATORY_CARE_PROVIDER_SITE_OTHER): Payer: Commercial Managed Care - PPO | Admitting: Pharmacist

## 2017-11-16 DIAGNOSIS — Z952 Presence of prosthetic heart valve: Secondary | ICD-10-CM

## 2017-11-16 DIAGNOSIS — Z7901 Long term (current) use of anticoagulants: Secondary | ICD-10-CM | POA: Diagnosis not present

## 2017-11-16 LAB — POCT INR: INR: 2.7

## 2017-11-16 NOTE — Patient Instructions (Addendum)
HOLD warfarin dose today ONLY due to nose bleed, then continue 1 tablet daily except 1/2 tablet each Monday and Thursday.  Repeat INR in 6 weeks.   **May use AFRIN nasal spray to decrease bleeding* Okay to use nasal saline solution to keep nares moisture*

## 2017-11-19 ENCOUNTER — Encounter (HOSPITAL_BASED_OUTPATIENT_CLINIC_OR_DEPARTMENT_OTHER): Payer: Self-pay

## 2017-11-19 ENCOUNTER — Emergency Department (HOSPITAL_BASED_OUTPATIENT_CLINIC_OR_DEPARTMENT_OTHER): Payer: Commercial Managed Care - PPO

## 2017-11-19 ENCOUNTER — Emergency Department (HOSPITAL_BASED_OUTPATIENT_CLINIC_OR_DEPARTMENT_OTHER)
Admission: EM | Admit: 2017-11-19 | Discharge: 2017-11-20 | Disposition: A | Discharge status: Home / Self Care | Payer: Commercial Managed Care - PPO | Attending: Emergency Medicine | Admitting: Emergency Medicine

## 2017-11-19 ENCOUNTER — Other Ambulatory Visit: Payer: Self-pay

## 2017-11-19 DIAGNOSIS — J111 Influenza due to unidentified influenza virus with other respiratory manifestations: Secondary | ICD-10-CM

## 2017-11-19 DIAGNOSIS — J069 Acute upper respiratory infection, unspecified: Secondary | ICD-10-CM

## 2017-11-19 DIAGNOSIS — Z7901 Long term (current) use of anticoagulants: Secondary | ICD-10-CM | POA: Diagnosis not present

## 2017-11-19 DIAGNOSIS — R69 Illness, unspecified: Secondary | ICD-10-CM

## 2017-11-19 DIAGNOSIS — Z7982 Long term (current) use of aspirin: Secondary | ICD-10-CM | POA: Diagnosis not present

## 2017-11-19 DIAGNOSIS — I2510 Atherosclerotic heart disease of native coronary artery without angina pectoris: Secondary | ICD-10-CM | POA: Diagnosis not present

## 2017-11-19 DIAGNOSIS — Z951 Presence of aortocoronary bypass graft: Secondary | ICD-10-CM | POA: Insufficient documentation

## 2017-11-19 DIAGNOSIS — I10 Essential (primary) hypertension: Secondary | ICD-10-CM | POA: Diagnosis not present

## 2017-11-19 DIAGNOSIS — Z79899 Other long term (current) drug therapy: Secondary | ICD-10-CM | POA: Diagnosis not present

## 2017-11-19 DIAGNOSIS — R05 Cough: Secondary | ICD-10-CM | POA: Diagnosis present

## 2017-11-19 DIAGNOSIS — I252 Old myocardial infarction: Secondary | ICD-10-CM | POA: Diagnosis not present

## 2017-11-19 DIAGNOSIS — Z952 Presence of prosthetic heart valve: Secondary | ICD-10-CM | POA: Diagnosis not present

## 2017-11-19 NOTE — ED Triage Notes (Signed)
C/o flu like sx day 2-NAD-steady gait 

## 2017-11-19 NOTE — ED Provider Notes (Signed)
[facility] EMERGENCY DEPARTMENT Provider Note   CSN: [id number] Arrival date & time: 11/19/17  2049     History   Chief Complaint Chief Complaint  Patient presents with  . Cough    HPI [name] is a 67 y.o. female with PMH/o HTN, NSTEMI, Angina,  who presents for evaluation of cough, nasal congestion, rhinorrhea, generalized body aches, decreased appetite that began yesterday.  Patient reports that she felt like she is been running fever but has not measured her temperature.  She reports subjective fever and chills.  She has not taken any medications for the symptoms.  Patient patient reports that she feels sore all over her body.  She reports soreness in her chest and her abdomen when she coughs.  She states that cough is nonproductive.  She is not taking any medications for symptoms.  Patient did not get a flu shot this year.  Patient denies any chest pain at rest, chest pain or exertion, difficulty breathing, shortness of breath with exertion.  Patient denies any vomiting, nausea, diaphoresis, diarrhea, sore throat, numbness/weakness.   The history is provided by the patient.    Past Medical History:  Diagnosis Date  . Anginal pain (HCC)   . Coronary artery disease    mech AVR, CABG  . Hyperlipidemia   . Hypertension   . Non-STEMI (non-ST elevated myocardial infarction) (HCC)   . RA (rheumatoid arthritis) (HCC)   . S/P AVR (aortic valve replacement) 08/1999   St. Jude AVR for aortic insuff, Goal INR 2.5-3.5  . 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 Donata Clay) - complicated by pericardial hematoma --> subxiphoid pericardial exposure and evacuation b. Cath 06/2016: DES to SVG-LAD ostium  . Sarcoid   . Severe anemia   . Shortness of breath   . Subclavian artery stenosis (HCC) 06/2000   left subclavian bypass    Patient Active Problem List   Diagnosis Date Noted  . 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 DOE (dyspnea on exertion) 01/12/2017  . Encounter for long-term (current) use of medications 01/12/2017  . Acute blood loss anemia 08/15/2016  . Black stools 08/15/2016  . Aortic valve disease 07/03/2016  . Cardiomyopathy, ischemic 07/03/2016  . Hx of CABG   . NSTEMI (non-ST elevated myocardial infarction) (HCC) 06/20/2016  . Acute pericardial effusion post op without tamponade - S/P subxiphoid pericaridal window 11/21/12 11/21/2012  . PVD , known Lt SCA stenosis 11/21/2012  . Pleural effusion, RT, post op 11/21/2012  . Unstable angina on admission 11/04/12 11/04/2012  . Coronary artery disease involving native coronary artery of native heart without angina pectoris 11/04/2012  . S/P AVR (aortic valve replacement) 11/04/2012  . Dilated ascending aorta at cath 11/03/12 - S/P root replacement 11/05/12 11/04/2012  . Chronic anticoagulation for mechanical AVR 11/04/2012  . Contrast media allergy 11/04/2012  . Essential hypertension 11/04/2012  . Sarcoidosis 11/04/2012    Past Surgical History:  Procedure Laterality Date  . AORTIC VALVE REPLACEMENT  08/23/1999   22mm St. Jude, Bent[place] procedure, root replacemtn   . CARDIAC CATHETERIZATION  08/1999   normal L main, LAD & first diagonal are normal, normal Cfx, RCA with mild ostial narrowing, normal LV systolic function, severe AI, mild MR, normal PAP (Dr. Mervyn Skeeters. Little)   . CARDIAC CATHETERIZATION N/A 06/22/2016   Procedure: Left Heart Cath and Cors/Grafts Angiography;  Surgeon: Marykay Lex, MD;  Location: [facility] INVASIVE CV LAB;  Service: Cardiovascular;  Laterality: N/A;  . CARDIAC CATHETERIZATION N/A 06/23/2016   Procedure: Left Heart Cath and Coronary Angiography;  Surgeon: Tonny Bollman, MD;  Location: [facility] INVASIVE CV LAB;  Service: Cardiovascular;  Laterality: N/A;  . CARDIAC CATHETERIZATION N/A 06/23/2016   Procedure: Coronary Stent Intervention;  Surgeon: Tonny Bollman, MD;  Location: [facility] INVASIVE CV LAB;  Service:  Cardiovascular;  Laterality: N/A;  . CORONARY ARTERY BYPASS GRAFT  11/05/2012   Procedure: CORONARY ARTERY BYPASS GRAFTING (CABG);  Surgeon: Kerin Perna, MD;  Location: [facility] OR;  Service: Open Heart Surgery;  Laterality: N/A;  cannulate right subclavian  . ENDOVEIN HARVEST OF GREATER SAPHENOUS VEIN  11/05/2012   Procedure: ENDOVEIN HARVEST OF GREATER SAPHENOUS VEIN;  Surgeon: Kerin Perna, MD;  Location: [facility] OR;  Service: Open Heart Surgery;  Laterality: Right;  . INTRAOPERATIVE TRANSESOPHAGEAL ECHOCARDIOGRAM  11/05/2012   Procedure: INTRAOPERATIVE TRANSESOPHAGEAL ECHOCARDIOGRAM;  Surgeon: Kerin Perna, MD;  Location: [facility] OR;  Service: Open Heart Surgery;  Laterality: N/A;  . INTRAOPERATIVE TRANSESOPHAGEAL ECHOCARDIOGRAM N/A 11/21/2012   Procedure: INTRAOPERATIVE TRANSESOPHAGEAL ECHOCARDIOGRAM;  Surgeon: Kerin Perna, MD;  Location: [facility] OR;  Service: Open Heart Surgery;  Laterality: N/A;  . LEFT HEART CATHETERIZATION WITH CORONARY ANGIOGRAM N/A 11/04/2012   Procedure: LEFT HEART CATHETERIZATION WITH CORONARY ANGIOGRAM;  Surgeon: Chrystie Nose, MD;  Location: [facility] CATH LAB;  Service: Cardiovascular;  Laterality: N/A;  . MEDIASTINOSCOPY  08/2005   bronchoscopy & mediastinoscopy for mediastinal adenopathy (Dr. Donata Clay)   . NM MYOCAR PERF WALL MOTION  01/2012   lexiscan; small, fixed apical septal breast attenuation artifact & inferolateral bowel attenuation, no reversible ischemia, post-stress EF 64%, low risk scan   . PERICARDIAL WINDOW N/A 11/21/2012   Procedure: PERICARDIAL WINDOW;  Surgeon: Kerin Perna, MD;  Location: [facility] OR;  Service: Thoracic;  Laterality: N/A;  . REPLACEMENT ASCENDING AORTA  11/05/2012   Procedure: REPLACEMENT ASCENDING AORTA;  Surgeon: Kerin Perna, MD;  Location: [facility] OR;  Service: Open Heart Surgery;  Laterality: N/A;  . SUBCLAVIAN ANGIOGRAM  05/10/2000   left subclavian arteriogram; patent graft, mild stenosis (Dr. Amada Kingfisher)   . SUBCLAVIAN BYPASS GRAFT  05/16/1999   left  subclavian bypass with 19mm Darcon graft (Dr. Amada Kingfisher)  . TRANSTHORACIC ECHOCARDIOGRAM  11/20/2012   EF 65-70%, mild conc LVH, grade 2 diastolic dysfunction, mech AV, mild MR    OB History    No data available       Home Medications    Prior to Admission medications   Medication Sig Start Date End Date Taking? Authorizing Provider  aspirin EC 81 MG EC tablet Take 1 tablet (81 mg total) by mouth daily. 06/26/16   Iran Ouch, Lennart Pall, PA-C  atorvastatin (LIPITOR) 80 MG tablet TAKE 1 TABLET (80 MG TOTAL) BY MOUTH DAILY AT 6 PM. 01/17/17   Strader, [place] M, PA-C  Calcium Carb-Cholecalciferol (CALCIUM 600+D3 PO) Take 2,000 Units by mouth.    [provider]  clopidogrel (PLAVIX) 75 MG tablet TAKE 1 TABLET (75 MG TOTAL) BY MOUTH DAILY WITH BREAKFAST. 01/17/17   Strader, Lennart Pall, PA-C  diazepam (VALIUM) 2 MG tablet Take 1 tablet (2 mg total) by mouth every 8 (eight) hours as needed for muscle spasms. 12/29/16   Everlene Farrier, PA-C  Diclofenac Sodium 3 % GEL Place 1 application onto the skin 2 (two) times daily. To affected area. 12/29/16   Everlene Farrier, PA-C  fluticasone (FLONASE) 50 MCG/ACT nasal  spray Place 1 spray into both nostrils daily. 11/20/17   Maxwell Caul, PA-C  isosorbide mononitrate (IMDUR) 30 MG 24 hr tablet TAKE 1 TABLET BY MOUTH EVERY DAY 11/15/17   Barrett, Joline Salt, PA-C  lisinopril (PRINIVIL,ZESTRIL) 10 MG tablet Take 1 tablet (10 mg total) by mouth daily. 08/03/17 11/01/17  Chrystie Nose, MD  Multiple Vitamin (MULTIVITAMIN WITH MINERALS) TABS tablet Take 1 tablet by mouth daily.    [provider]  nitroGLYCERIN (NITROSTAT) 0.4 MG SL tablet PLACE 1 TABLET (0.4 MG TOTAL) UNDER THE TONGUE EVERY 5 (FIVE) MINUTES AS NEEDED FOR CHEST PAIN. 10/19/17   Hilty, Lisette Abu, MD  ondansetron (ZOFRAN) 4 MG tablet Take 1 tablet (4 mg total) by mouth every 6 (six) hours. 11/20/17   Maxwell Caul, PA-C  oseltamivir (TAMIFLU) 75 MG capsule Take 1 capsule (75 mg  total) by mouth every 12 (twelve) hours. 11/20/17   Maxwell Caul, PA-C  pantoprazole (PROTONIX) 40 MG tablet TAKE 1 TABLET BY MOUTH EVERY DAY 09/17/17   Hilty, Lisette Abu, MD  polyvinyl alcohol (ARTIFICIAL TEARS) 1.4 % ophthalmic solution Place 1-2 drops into both eyes daily as needed for dry eyes.    [provider]  warfarin (COUMADIN) 5 MG tablet Take 1/2 to 1 tablet by mouth daily or as directed by coumadin clinic 08/29/17   Hilty, Lisette Abu, MD    Family History Family History  Problem Relation Age of Onset  . Hypertension Mother   . Colon cancer Neg Hx   . Esophageal cancer Neg Hx   . Pancreatic cancer Neg Hx   . Stomach cancer Neg Hx   . Liver disease Neg Hx     Social History Social History   Tobacco Use  . Smoking status: Never Smoker  . Smokeless tobacco: Never Used  Substance Use Topics  . Alcohol use: No  . Drug use: No     Allergies   Contrast media [iodinated diagnostic agents]   Review of Systems Review of Systems  Constitutional: Positive for appetite change and fever (subjective). Negative for diaphoresis.  HENT: Positive for congestion and rhinorrhea.   Respiratory: Positive for cough. Negative for shortness of breath.   Gastrointestinal: Negative for abdominal pain, diarrhea, nausea and vomiting.  Musculoskeletal: Positive for myalgias.     Physical Exam Updated Vital Signs BP (!) 120/98 (BP Location: Left Arm)   Pulse 90   Temp 100.1 F (37.8 C) (Oral)   Resp 20   Ht 5\' 6"  (1.676 m)   Wt 71.2 kg (157 lb)   SpO2 100%   BMI 25.34 kg/m   Physical Exam  Constitutional: She is oriented to person, place, and time. She appears well-developed and well-nourished.  Sitting comfortably on examination table  HENT:  Head: Normocephalic and atraumatic.  Nose: Mucosal edema and rhinorrhea present.  Mouth/Throat: Oropharynx is clear and moist and mucous membranes are normal.  Posterior oropharynx is clear without any signs of erythema,  edema, exudates.  No neck or facial swelling.  Airway is patent, phonation is normal.  Eyes: Conjunctivae, EOM and lids are normal. Pupils are equal, round, and reactive to light.  Neck: Full passive range of motion without pain.  Cardiovascular: Normal rate, regular rhythm, normal heart sounds and normal pulses. Exam reveals no gallop and no friction rub.  No murmur heard. Pulses:      Radial pulses are 2+ on the right side, and 2+ on the left side.  Auditory mechanical valve  Pulmonary/Chest:  Effort normal and breath sounds normal.  No evidence of respiratory distress. Able to speak in full sentences without difficulty.  Abdominal: Soft. Normal appearance. There is no tenderness. There is no rigidity and no guarding.  Musculoskeletal: Normal range of motion.  Neurological: She is alert and oriented to person, place, and time.  Skin: Skin is warm and dry. Capillary refill takes less than 2 seconds.  Psychiatric: She has a normal mood and affect. Her speech is normal.  Nursing note and vitals reviewed.    ED Treatments / Results  Labs (all labs ordered are listed, but only abnormal results are displayed) Labs Reviewed  INFLUENZA PANEL BY PCR (TYPE A & B)    EKG  EKG Interpretation  Date/Time:  Monday November 19 2017 23:56:44 EST Ventricular Rate:  80 PR Interval:  140 QRS Duration: 98 QT Interval:  402 QTC Calculation: 463 R Axis:   77 Text Interpretation:  Normal sinus rhythm Minimal voltage criteria for LVH, may be normal variant Nonspecific T wave abnormality Abnormal ECG Confirmed by Ross Marcus ([id number]) on 11/20/2017 12:02:01 AM       Radiology Dg Chest 2 View  Result Date: 11/19/2017 CLINICAL DATA:  Sore throat, congestion and sternal chest pain with fever and nausea x2 days. EXAM: CHEST  2 VIEW COMPARISON:  06/19/2016 FINDINGS: Cardiomediastinal contours are stable with a tortuous moderate atherosclerotic appearance of the aorta. No active pulmonary disease. No  effusion or pneumothorax. Median sternotomy sutures are in place. Surgical clips project over the right shoulder and left lung apex. IMPRESSION: No active cardiopulmonary disease. Tortuous aorta with moderate atherosclerosis. Electronically Signed   By: Tollie Eth M.D.   On: 11/19/2017 23:25    Procedures Procedures (including critical care time)  Medications Ordered in ED Medications - No data to display   Initial Impression / Assessment and Plan / ED Course  I have reviewed the triage vital signs and the nursing notes.  Pertinent labs & imaging results that were available during my care of the patient were reviewed by me and considered in my medical decision making (see chart for details).     67 year old female past medical history of HTN, NSTEMI, Angina, presents for evaluation of cough, nasal body aches, subjective fever and chills that began yesterday.  Patient does have significant cardiac history.  She does report some chest soreness and abdominal soreness with coughing.  She also reports generalized body aches and include her chest and her abdomen.  She denies any chest pain with exertion, chest pain at rest, chest pain on deep inspiration.  No shortness of breath.  Patient states the symptoms do not feel like when she had a heart attack.  Initial ED arrival, patient is afebrile.  Vitals are otherwise stable.  On exam, patient does have significant nasal mucosal edema, rhinorrhea.  Lungs clear to auscultation bilaterally.  Consider influenza versus upper respiratory infection versus pneumonia.  History/physical exam is not aortic dissection, endocarditis, myocarditis.  Plan to check influenza, chest x-ray, EKG given patient's cardiac history.  Chest x-ray reviewed.  Negative for any acute infectious etiology.  EKG as documented above.  Influenza is pending.  Patient given patient's history/physical exam and presentation, will plan to periodically treat for influenza.  Patient was is  within the 24-48-hour window of Tamiflu treatment I discussed the risk first benefits of Tamiflu and instructed patient to call the ED tomorrow for the results.  Instructed follow-up with her primary care doctor next 24-48 hours for further evaluation.  Patient had ample opportunity for questions and discussion. All patient's questions were answered with full understanding. Strict return precautions discussed. Patient expresses understanding and agreement to plan.   Final Clinical Impressions(s) / ED Diagnoses   Final diagnoses:  Influenza-like illness  Upper respiratory tract infection, unspecified type    ED Discharge Orders        Ordered    oseltamivir (TAMIFLU) 75 MG capsule  Every 12 hours     11/20/17 0002    ondansetron (ZOFRAN) 4 MG tablet  Every 6 hours     11/20/17 0002    fluticasone (FLONASE) 50 MCG/ACT nasal spray  Daily     11/20/17 0005       Maxwell Caul, PA-C 11/20/17 0304    Shon Baton, MD 11/20/17 [phone]

## 2017-11-20 ENCOUNTER — Other Ambulatory Visit: Payer: Self-pay | Admitting: Internal Medicine

## 2017-11-20 LAB — INFLUENZA PANEL BY PCR (TYPE A & B)
Influenza A By PCR: NEGATIVE
Influenza B By PCR: NEGATIVE

## 2017-11-20 MED ORDER — OSELTAMIVIR PHOSPHATE 75 MG PO CAPS: 75.0000 mg | ORAL_CAPSULE | Freq: Two times a day (BID) | ORAL | 0 refills | Status: DC

## 2017-11-20 MED ORDER — ONDANSETRON HCL 4 MG PO TABS: 4.0000 mg | ORAL_TABLET | Freq: Four times a day (QID) | ORAL | 0 refills | Status: DC

## 2017-11-20 MED ORDER — FLUTICASONE PROPIONATE 50 MCG/ACT NA SUSP: 1.0000 | Freq: Every day | NASAL | 2 refills | Status: DC

## 2017-11-20 NOTE — Discharge Instructions (Signed)
As we discussed, your influenza test will be back tomorrow.  You can call the ED to see the results of it.  I provided with a prescription of Tamiflu.  If your influenza is positive, take the Tamiflu as directed.  If it is negative, you do not need to take the Tamiflu.  If you take the Tamiflu, and may make you nauseous.  I provided Zofran to help with that.  Follow-up with your primary care doctor in the next 24-48 hours for further evaluation.  Return to the emergency department for any worsening fever, chest pain, difficulty breathing, abdominal pain, vomiting, numbness/weakness of your arms or legs or any other worsening or concerning symptoms.

## 2017-11-21 NOTE — ED Notes (Signed)
11/21/2017 , Returned pt. Call. No answer, message left for pt. To return call if needed.

## 2017-12-16 ENCOUNTER — Other Ambulatory Visit: Payer: Self-pay | Admitting: Student

## 2017-12-28 ENCOUNTER — Ambulatory Visit (INDEPENDENT_AMBULATORY_CARE_PROVIDER_SITE_OTHER): Payer: Commercial Managed Care - PPO | Admitting: Pharmacist

## 2017-12-28 DIAGNOSIS — Z7901 Long term (current) use of anticoagulants: Secondary | ICD-10-CM | POA: Diagnosis not present

## 2017-12-28 DIAGNOSIS — Z952 Presence of prosthetic heart valve: Secondary | ICD-10-CM | POA: Diagnosis not present

## 2017-12-28 LAB — POCT INR: INR: 2.4

## 2018-01-16 ENCOUNTER — Other Ambulatory Visit: Payer: Self-pay | Admitting: Student

## 2018-01-16 NOTE — Telephone Encounter (Signed)
Rx(s) sent to pharmacy electronically.  

## 2018-01-17 ENCOUNTER — Telehealth: Payer: Self-pay | Admitting: *Deleted

## 2018-01-17 ENCOUNTER — Telehealth: Payer: Self-pay | Admitting: Internal Medicine

## 2018-01-17 NOTE — Telephone Encounter (Signed)
Called patient and LVM to call and scheduled visit.

## 2018-01-17 NOTE — Telephone Encounter (Signed)
Left message for patient to call and schedule yearly visit with Dr. Hilty 

## 2018-02-13 ENCOUNTER — Other Ambulatory Visit: Payer: Self-pay | Admitting: Internal Medicine

## 2018-02-13 NOTE — Telephone Encounter (Signed)
REFILL 

## 2018-02-13 NOTE — Telephone Encounter (Signed)
Follow up     *STAT* If patient is at the pharmacy, call can be transferred to refill team.   1. Which medications need to be refilled? (please list name of each medication and dose if known) nitroGLYCERIN (NITROSTAT) 0.4 MG SL tablet  2. Which pharmacy/location (including street and city if local pharmacy) is medication to be sent to?[id number] - [place], [place] - [address]  3. Do they need a 30 day or 90 day supply? 30

## 2018-02-14 ENCOUNTER — Other Ambulatory Visit: Payer: Self-pay | Admitting: Internal Medicine

## 2018-02-21 ENCOUNTER — Ambulatory Visit: Payer: Commercial Managed Care - PPO | Admitting: Internal Medicine

## 2018-02-22 ENCOUNTER — Encounter: Payer: Self-pay | Admitting: Internal Medicine

## 2018-02-22 ENCOUNTER — Ambulatory Visit (INDEPENDENT_AMBULATORY_CARE_PROVIDER_SITE_OTHER): Payer: Commercial Managed Care - PPO | Admitting: Pharmacist Clinician (PhC)/ Clinical Pharmacy Specialist

## 2018-02-22 ENCOUNTER — Ambulatory Visit: Payer: Commercial Managed Care - PPO | Admitting: Internal Medicine

## 2018-02-22 VITALS — BP 130/60 | HR 59 | Ht 67.0 in | Wt 158.8 lb

## 2018-02-22 DIAGNOSIS — I10 Essential (primary) hypertension: Secondary | ICD-10-CM | POA: Diagnosis not present

## 2018-02-22 DIAGNOSIS — E785 Hyperlipidemia, unspecified: Secondary | ICD-10-CM

## 2018-02-22 DIAGNOSIS — Z952 Presence of prosthetic heart valve: Secondary | ICD-10-CM

## 2018-02-22 DIAGNOSIS — I2510 Atherosclerotic heart disease of native coronary artery without angina pectoris: Secondary | ICD-10-CM | POA: Diagnosis not present

## 2018-02-22 DIAGNOSIS — Z7901 Long term (current) use of anticoagulants: Secondary | ICD-10-CM

## 2018-02-22 LAB — LIPID PANEL
CHOLESTEROL TOTAL: 134 mg/dL (ref 100–199)
Chol/HDL Ratio: 1.6 ratio (ref 0.0–4.4)
HDL: 84 mg/dL (ref >39)
LDL Calculated: 42 mg/dL (ref 0–99)
TRIGLYCERIDES: 40 mg/dL (ref 0–149)
VLDL Cholesterol Cal: 8 mg/dL (ref 5–40)

## 2018-02-22 LAB — POCT INR: INR: 2.4 (ref 2.0–3.0)

## 2018-02-22 NOTE — Progress Notes (Signed)
OFFICE NOTE  Chief Complaint:  Routine follow-up  Primary Care Physician: [name], MD  HPI:  [name] is a very pleasant 67 year old African American female with a history of St. Jude AVR placed in 2000 for aortic insufficiency. 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 She also has a history of sarcoidosis, dyslipidemia and contrast allergy, which apparently gave her hives. In April 2013, she saw Dr. Clarene Duke with complaints of chest pain, for which he then did a Myoview on her, which was normal. She was last seen by Corine Shelter on October 21, 2012. At that time she was complaining of having chest pressure and shortness of breath. She was set up for a diagnostic heart catheterization. This was completed by Dr. Rennis Golden, which revealed severe ostial left main stenosis greater than 95%. There was also suggestion of possible ostial RCA disease. The ascending aorta was dilated up to 4.8 cm. At that time, Dr. Donata Clay was asked to consult. Patient was then taken for bypass surgery x2 with a saphenous vein graft to the left anterior descending coronary artery and a saphenous vein graft to the circumflex marginal. 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 Also during that hospitalization, she had development of a pericardial hematoma. Dr. Donata Clay then did a subxiphoid pericardial exposure and evacuation of the pericardial hematoma. She was seen in follow-up in March 2014 and was doing well.  She had lost about 17 lbs during her hospitalization.  She reports returning to work this past summer and is still able to do most activities. Her INR has been followed in our Coumadin clinic and is actually supratherapeutic today at 4. Her dose will be adjusted. She's not had any further laboratory work or followup since this past Spring.  She denies any chest pain with exertion, but does report some discomfort associated with her  anterior scars as well as her sternal wires. She does occasionally get fatigued with work but this improves with rest.  I saw [name] back today in follow-up. She is doing fairly well. She denies any shortness of breath or chest pain. She is annoyed somewhat at her mechanical valve which she says is very loud. She underwent a CT scan this year in March which shows a stable thoracic aneurysm repair at 4.0 cm. 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 In the past she's had left subclavian bypass and will need ultrasound follow-up of that. She's also not had any lab work in the past several months.  [name] returns today for follow-up. Overall she seems to be doing pretty well. She says she gets hot and noted that her blood pressure is somewhat elevated from time to time now. It seems as if she's not currently on any blood pressure medicines. Last time I saw her she was not taking lisinopril and her blood pressure was fairly normal therefore I kept her off the medicine. Previous to that she was taken off of her beta blocker. She really has indication for either one of these are both based on her history of coronary disease, coronary artery bypass grafting, valve repair, and aneurysm. Blood pressure is elevated today. She is due for an INR check. Dopplers including AAA screen as well as ultrasound of her subclavian stenosis were performed in October last year and are stable.  07/03/2016  [name] returns today for follow-up.  Unfortunately she recently had some unstable angina and presented to the hospital with chest pain. She underwent cardiac catheterization after being found to have a NSTEMI with elevated troponin of 0.26. Warfarin was held and she was found to have an EF of 40-45% with moderate hypokinesis of the basal inferior myocardium and grade 2 diastolic dysfunction. Her mechanical heart valve was functioning normally. She then underwent cardiac  catheterization which demonstrated a patent SVG to LAD and SVG to OM1. There was 95% stenosis of an ostial left main, 90% ostial first marginal, her percent ostial RCA and 90% ostial LAD disease. Given the difficulty of the case, intervention was deferred until the next day when Dr. Excell Seltzer performed PCI to the ostium of the SVG to LAD. Selective angiography was not possible after the case due to the stent strut hanging out into the aorta however aortic root angiography showed normal flow down the graft and the patient was noted to be chest pain-free. He recommended aspirin, Plavix and warfarin for 3 months, then discontinuing aspirin and continuing warfarin and Plavix. Today she reports no further chest pain. She initially talked to me about disability however after more discussion she understood that if she were to become formally disabled she could never work again and she did want to continue to work part time in addition to having disability. Based on that she wishes to go back to work and she should be able to go back to work as early as October 9.  01/12/2017  [name] returns today for follow-up. She says she feels a bit stronger and does not feel palpitations is bad at night. EKG today however shows sinus rhythm with a second-degree AV block with a rate of 69. Blood pressures accordingly low 96/50. She is on low-dose metoprolol. She recently stopped aspirin is on Plavix and warfarin. She is due for an INR check today. She had a repeat echocardiogram which shows a normally functioning mechanical aortic valve as well as an improvement in EF up to 60-65%. She does report a little bit of fatigue particularly when walking around [place]. She is not significantly active and inoperative spitting cardigan rehabilitation due to her job. I encouraged her to start to do more physical activity as I do not see any cardiac limitation to her symptoms.   08/03/2017  [name] returns today for follow-up.  Overall she  is feeling well.  She occasionally gets some bruising.  She is currently on Plavix and warfarin.  Her INR was recently assessed.  She had a GI bleeding last fall but this is not recurred.  Her LVEF improved to 60-65% as of April 2018.  She denies any recurrent chest pain.  Her valve has been stable.  We did receive lab work from her PCP this summer which showed a well-controlled lipid profile as well as stable hemoglobin of 13.5.  She tells me that she plans to retire next year.  02/22/2018  Mrs. Muhs was seen today in follow-up.  She continues to do well.  She denies chest pain or worsening shortness of breath.  Her INR today was 2.4 which is slightly low however reasonably well controlled.  She denies any GI bleeding.  LVEF had normalized.  She was planning to retire however it says she is still working.  Blood pressure is well controlled today.  EKG shows sinus rhythm with PVCs and PACs.  She is not symptomatic with this.  PMHx:  Past Medical History:  Diagnosis Date  . Anginal  pain (HCC)   . Coronary artery disease    mech AVR, CABG  . Hyperlipidemia   . Hypertension   . Non-STEMI (non-ST elevated myocardial infarction) (HCC)   . RA (rheumatoid arthritis) (HCC)   . S/P AVR (aortic valve replacement) 08/1999   St. Jude AVR for aortic insuff, Goal INR 2.5-3.5  . 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 Donata Clay) - complicated by pericardial hematoma --> subxiphoid pericardial exposure and evacuation b. Cath 06/2016: DES to SVG-LAD ostium  . Sarcoid   . Severe anemia   . Shortness of breath   . 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 AORTIC VALVE REPLACEMENT  08/23/1999   57mm St. Jude, Bentall procedure, root replacemtn   . CARDIAC CATHETERIZATION  08/1999   normal L main, LAD & first diagonal are normal, normal Cfx, RCA  with mild ostial narrowing, normal LV systolic function, severe AI, mild MR, normal PAP (Dr. Mervyn Skeeters. [place])   . CARDIAC CATHETERIZATION N/A 06/22/2016   Procedure: Left Heart Cath and Cors/Grafts Angiography;  Surgeon: Marykay Lex, MD;  Location: [facility] INVASIVE CV LAB;  Service: Cardiovascular;  Laterality: N/A;  . CARDIAC CATHETERIZATION N/A 06/23/2016   Procedure: Left Heart Cath and Coronary Angiography;  Surgeon: Tonny Bollman, MD;  Location[facility] INVASIVE CV LAB;  Service: Cardiovascular;  Laterality: N/A;  . CARDIAC CATHETERIZATION N/A 06/23/2016   Procedure: Coronary Stent Intervention;  Surgeon: Tonny Bollman, MD;  Location: [facility] INVASIVE CV LAB;  Service: Cardiovascular;  Laterality: N/A;  . CORONARY ARTERY BYPASS GRAFT  11/05/2012   Procedure: CORONARY ARTERY BYPASS GRAFTING (CABG);  Surgeon: Kerin Perna, MD;  Location: [facility] OR;  Service: Open Heart Surgery;  Laterality: N/A;  cannulate right subclavian  . ENDOVEIN HARVEST OF GREATER SAPHENOUS VEIN  11/05/2012   Procedure: ENDOVEIN HARVEST OF GREATER SAPHENOUS VEIN;  Surgeon: Kerin Perna, MD;  Location: [facility] OR;  Service: Open Heart Surgery;  Laterality: Right;  . INTRAOPERATIVE TRANSESOPHAGEAL ECHOCARDIOGRAM  11/05/2012   Procedure: INTRAOPERATIVE TRANSESOPHAGEAL ECHOCARDIOGRAM;  Surgeon: Kerin Perna, MD;  Location: [facility] OR;  Service: Open Heart Surgery;  Laterality: N/A;  . INTRAOPERATIVE TRANSESOPHAGEAL ECHOCARDIOGRAM N/A 11/21/2012   Procedure: INTRAOPERATIVE TRANSESOPHAGEAL ECHOCARDIOGRAM;  Surgeon: Kerin Perna, MD;  Location: [facility] OR;  Service: Open Heart Surgery;  Laterality: N/A;  . LEFT HEART CATHETERIZATION WITH CORONARY ANGIOGRAM N/A 11/04/2012   Procedure: LEFT HEART CATHETERIZATION WITH CORONARY ANGIOGRAM;  Surgeon: Chrystie Nose, MD;  Location: [facility] CATH LAB;  Service: Cardiovascular;  Laterality: N/A;  . MEDIASTINOSCOPY  08/2005   bronchoscopy & mediastinoscopy for mediastinal adenopathy (Dr. Donata Clay)   . NM MYOCAR PERF WALL  MOTION  01/2012   lexiscan; small, fixed apical septal breast attenuation artifact & inferolateral bowel attenuation, no reversible ischemia, post-stress EF 64%, low risk scan   . PERICARDIAL WINDOW N/A 11/21/2012   Procedure: PERICARDIAL WINDOW;  Surgeon: Kerin Perna, MD;  Location[facility] OR;  Service: Thoracic;  Laterality: N/A;  . REPLACEMENT ASCENDING AORTA  11/05/2012   Procedure: REPLACEMENT ASCENDING AORTA;  Surgeon: Kerin Perna, MD;  Location: [facility] OR;  Service: Open Heart Surgery;  Laterality: N/A;  . SUBCLAVIAN ANGIOGRAM  05/10/2000   left subclavian arteriogram; patent graft, mild stenosis (Dr. Amada Kingfisher)   . SUBCLAVIAN BYPASS GRAFT  05/16/1999  left subclavian bypass with 68mm Darcon graft (Dr. Amada Kingfisher)  . TRANSTHORACIC ECHOCARDIOGRAM  11/20/2012   EF 65-70%, mild conc LVH, grade 2 diastolic dysfunction, mech AV, mild MR    FAMHx:  Family History  Problem Relation Age of Onset  . Hypertension Mother   . Colon cancer Neg Hx   . Esophageal cancer Neg Hx   . Pancreatic cancer Neg Hx   . Stomach cancer Neg Hx   . Liver disease Neg Hx     SOCHx:   reports that she has never smoked. She has never used smokeless tobacco. She reports that she does not drink alcohol or use drugs.  ALLERGIES:  Allergies  Allergen Reactions  . Contrast Media [Iodinated Diagnostic Agents] Hives, Itching and Swelling    ROS: Pertinent items noted in HPI and remainder of comprehensive ROS otherwise negative.  HOME MEDS: Current Outpatient Medications  Medication Sig Dispense Refill  . aspirin EC 81 MG EC tablet Take 1 tablet (81 mg total) by mouth daily.    Marland Kitchen atorvastatin (LIPITOR) 80 MG tablet TAKE 1 TABLET (80 MG TOTAL) BY MOUTH DAILY AT 6 PM. 90 tablet 2  . Calcium Carb-Cholecalciferol (CALCIUM 600+D3 PO) Take 2,000 Units by mouth.    . clopidogrel (PLAVIX) 75 MG tablet TAKE 1 TABLET BY MOUTH DAILY WITH BREAKFAST 90 tablet 3  . diazepam (VALIUM) 2 MG tablet Take 1 tablet (2 mg total) by mouth  every 8 (eight) hours as needed for muscle spasms. 10 tablet 0  . Diclofenac Sodium 3 % GEL Place 1 application onto the skin 2 (two) times daily. To affected area. 100 g 0  . fluticasone (FLONASE) 50 MCG/ACT nasal spray Place 1 spray into both nostrils daily. 16 g 2  . isosorbide mononitrate (IMDUR) 30 MG 24 hr tablet TAKE 1 TABLET BY MOUTH EVERY DAY 30 tablet 6  . lisinopril (PRINIVIL,ZESTRIL) 10 MG tablet Take 1 tablet (10 mg total) by mouth daily. 90 tablet 3  . Multiple Vitamin (MULTIVITAMIN WITH MINERALS) TABS tablet Take 1 tablet by mouth daily.    . nitroGLYCERIN (NITROSTAT) 0.4 MG SL tablet PLACE 1 TABLET (0.4 MG TOTAL) UNDER THE TONGUE EVERY 5 (FIVE) MINUTES AS NEEDED FOR CHEST PAIN. 25 tablet 6  . ondansetron (ZOFRAN) 4 MG tablet Take 1 tablet (4 mg total) by mouth every 6 (six) hours. 12 tablet 0  . oseltamivir (TAMIFLU) 75 MG capsule Take 1 capsule (75 mg total) by mouth every 12 (twelve) hours. (Patient not taking: Reported on 02/22/2018) 10 capsule 0  . pantoprazole (PROTONIX) 40 MG tablet TAKE 1 TABLET BY MOUTH EVERY DAY 30 tablet 6  . polyvinyl alcohol (ARTIFICIAL TEARS) 1.4 % ophthalmic solution Place 1-2 drops into both eyes daily as needed for dry eyes.    Marland Kitchen warfarin (COUMADIN) 5 MG tablet TAKE 1/2 TO 1 TABLET BY MOUTH DAILY OR AS DIRECTED BY COUMADIN CLINIC 30 tablet 0   No current facility-administered medications for this visit.     LABS/IMAGING: No results found for this or any previous visit (from the past 48 hour(s)). No results found.  VITALS: BP 130/60   Pulse (!) 59   Ht 5\' 7"  (1.702 m)   Wt 158 lb 12.8 oz (72 kg)   BMI 24.87 kg/m   EXAM: General appearance: alert and no distress Neck: no carotid bruit and no JVD Lungs: clear to auscultation bilaterally Heart: regular rate and rhythm and sharp mechanical valve sounds Abdomen: soft, non-tender; bowel sounds normal; no masses,  no organomegaly Extremities: extremities normal, atraumatic, no cyanosis or  edema Pulses: 2+ and symmetric Skin: Skin color, texture, turgor normal. No rashes or lesions Neurologic: Grossly normal Psych: Pleasant, normal  EKG: Sinus bradycardia with PVCs and PACs, minimal voltage criteria for LVH, nonspecific T wave changes at 59-personally reviewed  ASSESSMENT: 1. Coronary artery disease status post two-vessel CABG in 11/2012 (LIMA to LAD, SVG to circumflex). 2. Recent NSTEMI - s/p PCI with DES to Ostial SVG-LAD stenosis (06/2016) 3. Ischemic cardiopathy EF 40-45% with inferior hypokinesis - improved to 60-65% (echo 12/2016) 4. 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 Status post pericardial window with hematoma evacuation 6. History of left subclavian stenosis status post bypass in 2001 7. Rheumatoid arthritis 8. Sarcoidosis 9. Dyslipidemia 10. Hypertension  PLAN: 1.   Mrs. Villamar seems to be doing well with good control of her blood pressure.  She is due for repeat lipid profile which were ordered today.  Her INR was 2.4 may be adjusted by her pharmacist.  She denies any chest pain symptoms.  Her LVEF has normalized and she has NYHA class I heart failure symptoms.  Overall she is doing well.  Plan follow-up with me in 6 months or sooner as necessary.  Chrystie Nose, MD, [facility], FACP  [place]  [facility]  Medical Director of the Advanced Lipid Disorders &  Cardiovascular Risk Reduction Clinic Diplomate of the American Board of Clinical Lipidology Attending Cardiologist  Direct Dial: [phone]  Fax: [phone]  Website:  [email or web address] Nicely [email or web address] 02/22/2018, 8:23 AM

## 2018-02-22 NOTE — Patient Instructions (Signed)
Medication Instructions: Your physician recommends that you continue on your current medications as directed. Please refer to the Current Medication list given to you today.  If you need a refill on your cardiac medications before your next appointment, please call your pharmacy.   Labwork: Your provider would like for you to have the following labs today: FASTING Lipid   Follow-Up: Your physician wants you to follow-up in 6 months with Dr. Rennis Golden. You will receive a reminder letter in the mail two months in advance. If you don't receive a letter, please call our office at [phone] to schedule this follow-up appointment.   Thank you for choosing Heartcare at [facility]!!

## 2018-02-26 ENCOUNTER — Encounter: Payer: Self-pay | Admitting: Internal Medicine

## 2018-03-17 ENCOUNTER — Other Ambulatory Visit: Payer: Self-pay | Admitting: Internal Medicine

## 2018-04-18 IMAGING — DX DG CHEST 2V
2 series · 2 of 2 positions shown · non-contrast
Comparison: 06/19/2016

CLINICAL DATA: Sore throat, congestion and sternal chest pain with
fever and nausea x2 days.

EXAM:
CHEST  2 VIEW

[chest pa]
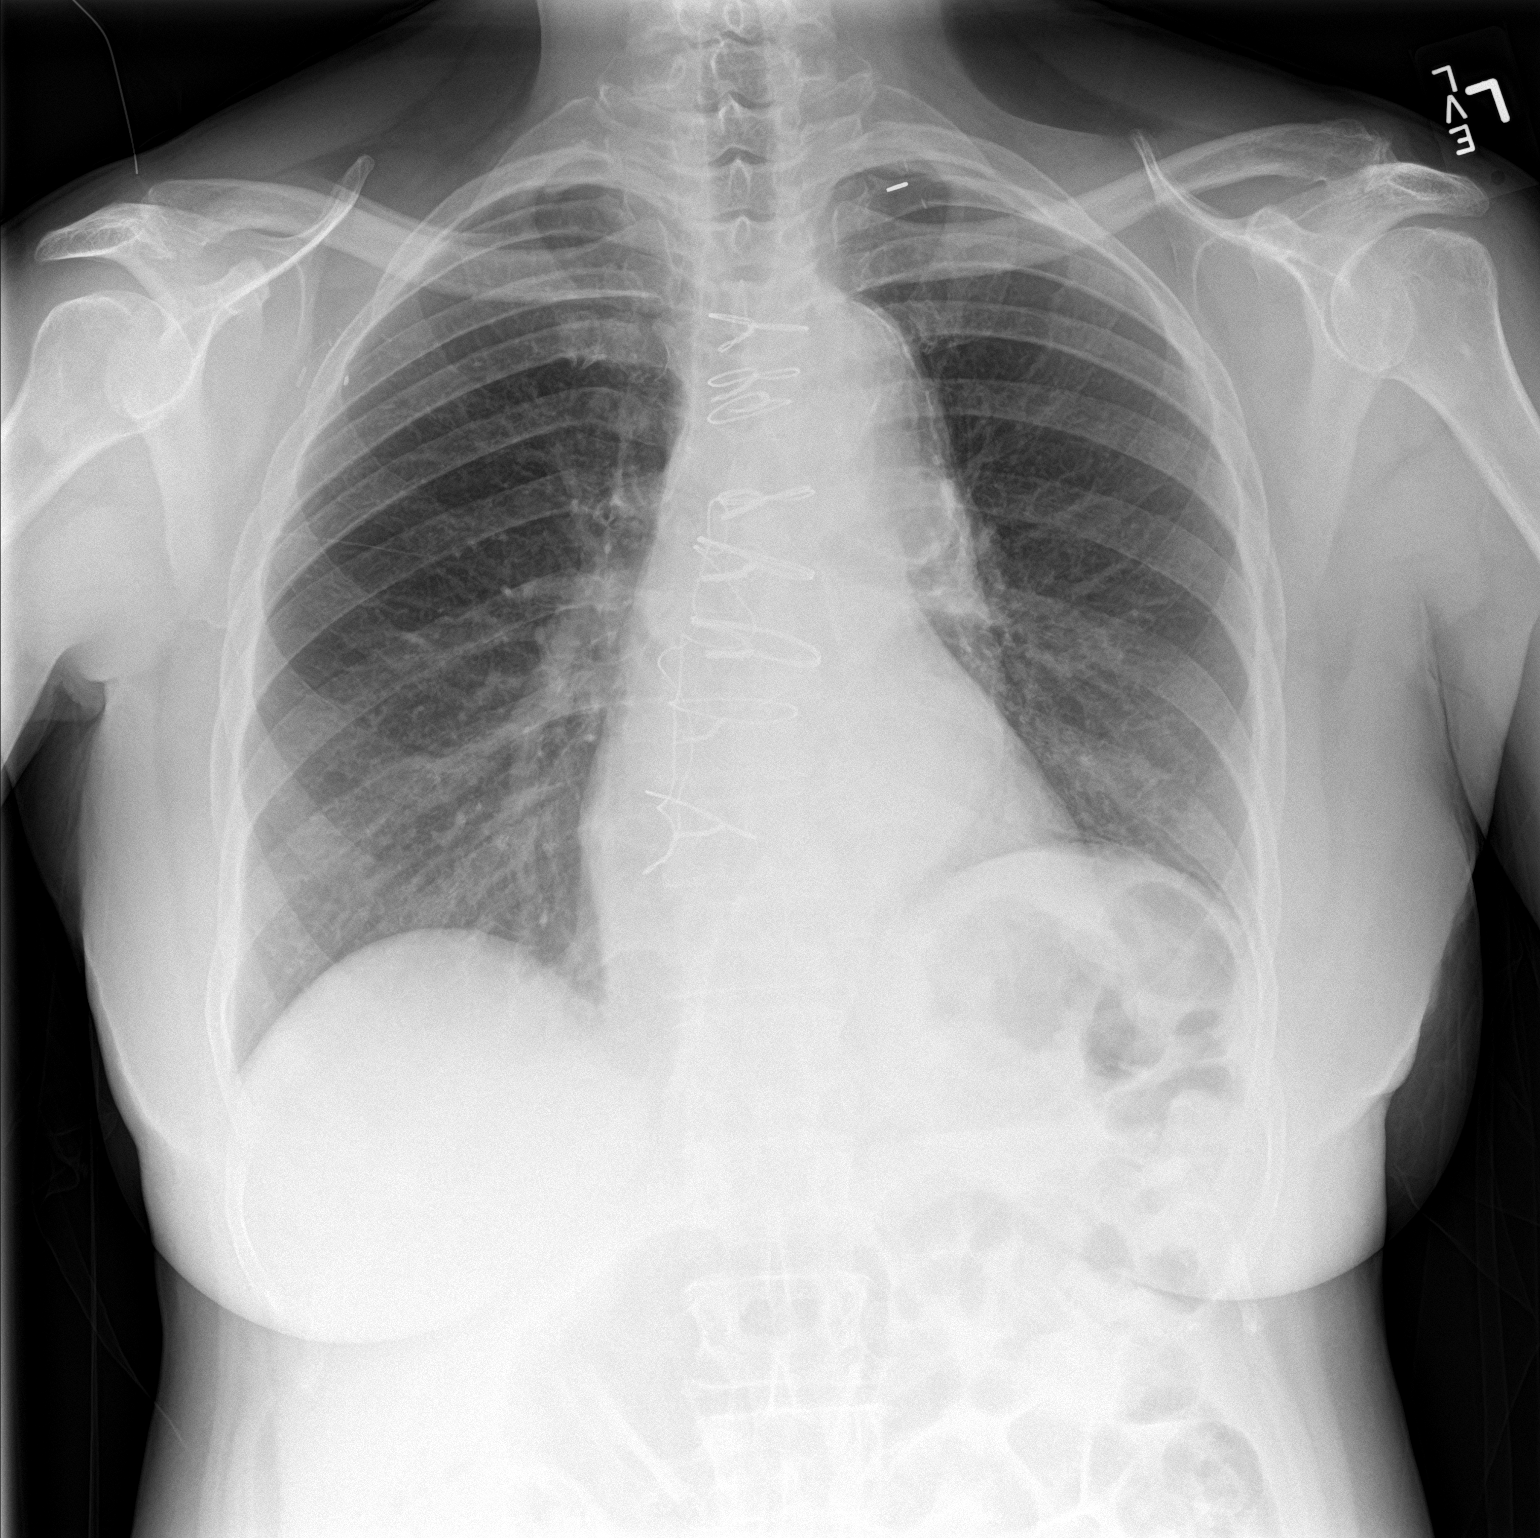

[chest lat]
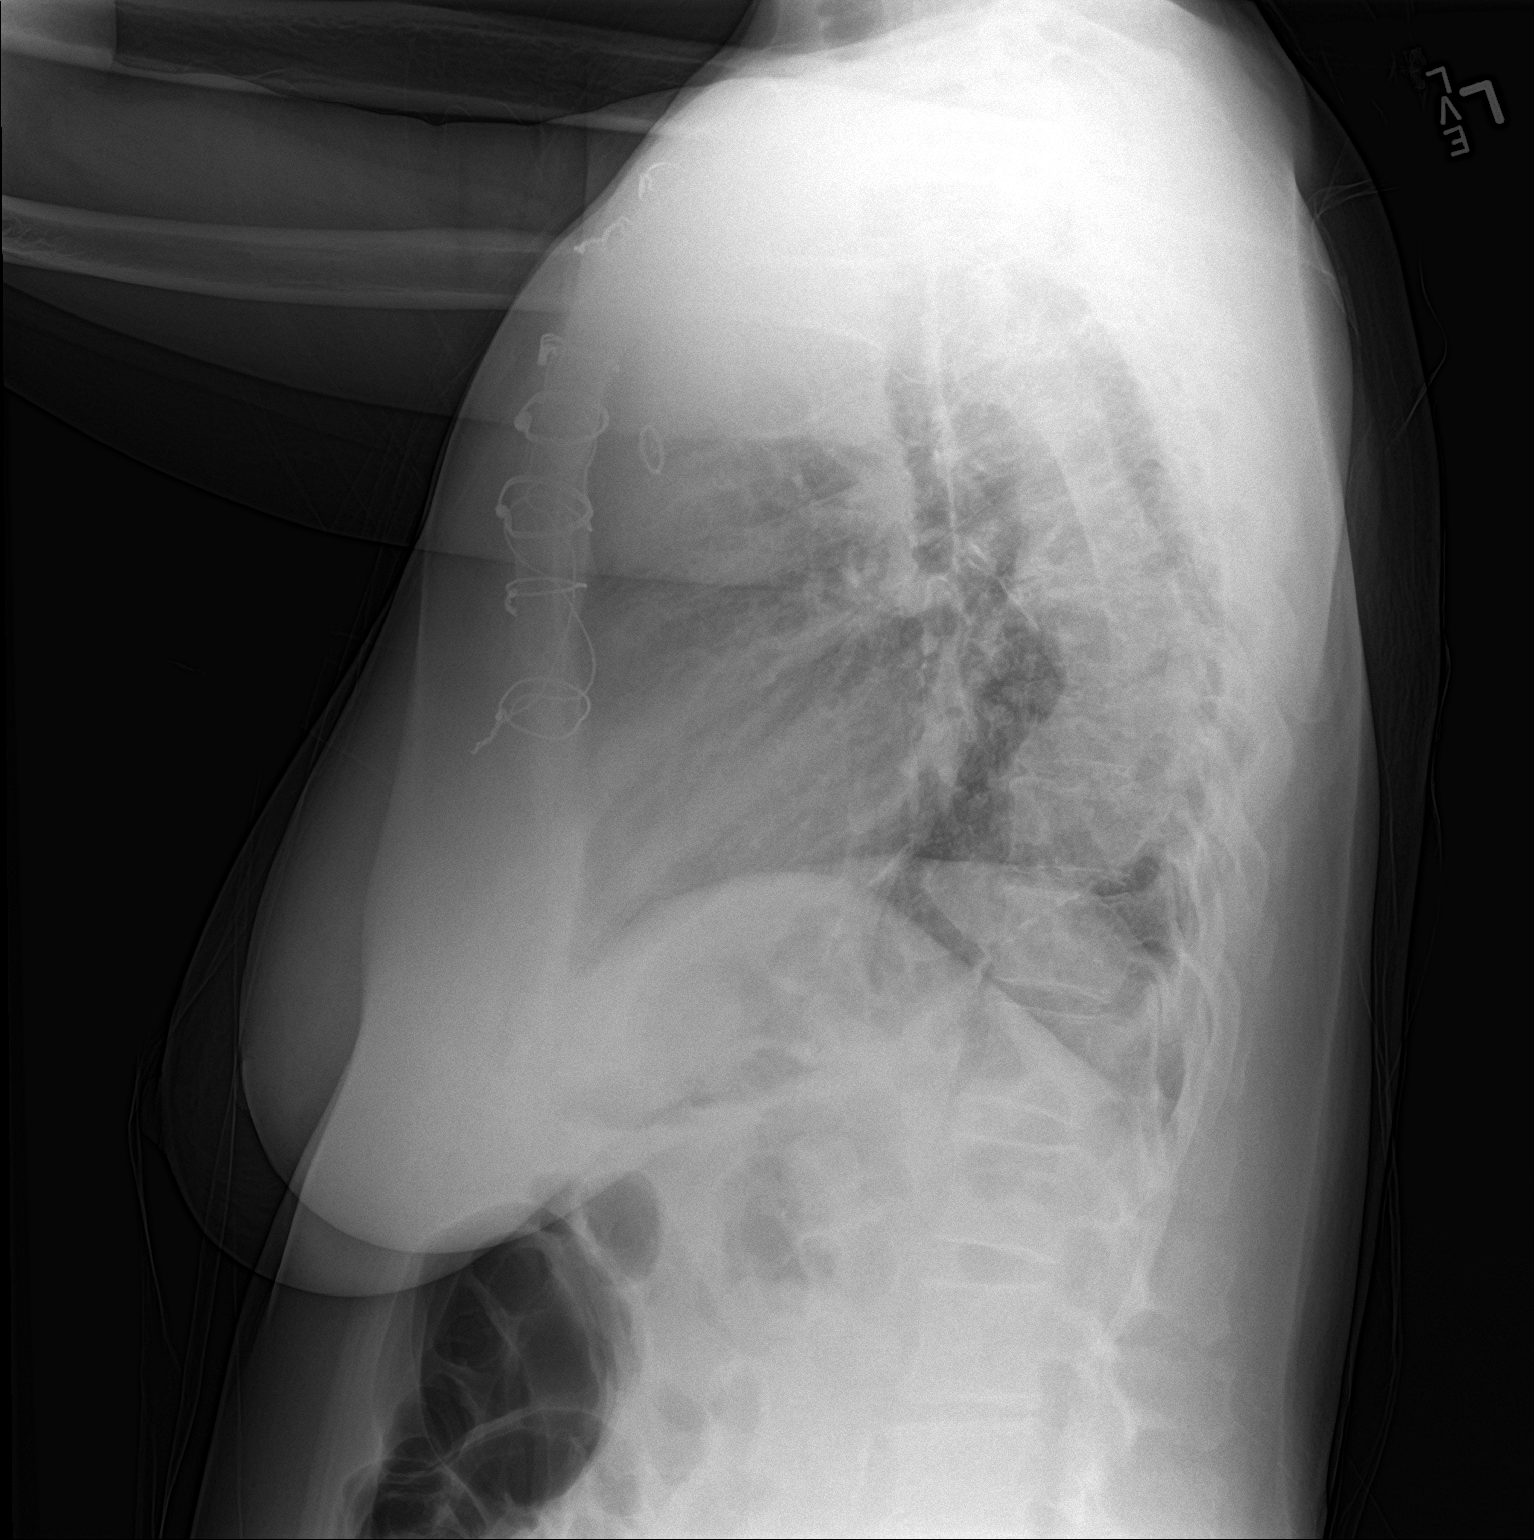

[2 of 2 positions shown; findings below may reference images not displayed]

FINDINGS: Cardiomediastinal contours are stable with a tortuous moderate
atherosclerotic appearance of the aorta. No active pulmonary
disease. No effusion or pneumothorax. Median sternotomy sutures are
in place. Surgical clips project over the right shoulder and left
lung apex.
IMPRESSION: No active cardiopulmonary disease. Tortuous aorta with moderate
atherosclerosis.

## 2018-04-27 ENCOUNTER — Other Ambulatory Visit: Payer: Self-pay | Admitting: Internal Medicine

## 2018-05-16 ENCOUNTER — Emergency Department (HOSPITAL_COMMUNITY): Payer: Commercial Managed Care - PPO

## 2018-05-16 ENCOUNTER — Encounter (HOSPITAL_COMMUNITY): Payer: Self-pay

## 2018-05-16 ENCOUNTER — Emergency Department (EMERGENCY_DEPARTMENT_HOSPITAL)
Admission: EM | Admit: 2018-05-16 | Discharge: 2018-05-16 | Discharge status: Against Medical Advice | Payer: Commercial Managed Care - PPO | Source: Home / Self Care | Attending: Emergency Medicine | Admitting: Emergency Medicine

## 2018-05-16 DIAGNOSIS — Z7902 Long term (current) use of antithrombotics/antiplatelets: Secondary | ICD-10-CM | POA: Insufficient documentation

## 2018-05-16 DIAGNOSIS — R079 Chest pain, unspecified: Secondary | ICD-10-CM

## 2018-05-16 DIAGNOSIS — R0789 Other chest pain: Secondary | ICD-10-CM

## 2018-05-16 DIAGNOSIS — Z79899 Other long term (current) drug therapy: Secondary | ICD-10-CM

## 2018-05-16 DIAGNOSIS — I214 Non-ST elevation (NSTEMI) myocardial infarction: Secondary | ICD-10-CM

## 2018-05-16 DIAGNOSIS — Z955 Presence of coronary angioplasty implant and graft: Secondary | ICD-10-CM | POA: Insufficient documentation

## 2018-05-16 DIAGNOSIS — I10 Essential (primary) hypertension: Secondary | ICD-10-CM

## 2018-05-16 DIAGNOSIS — I259 Chronic ischemic heart disease, unspecified: Secondary | ICD-10-CM | POA: Insufficient documentation

## 2018-05-16 DIAGNOSIS — Z7901 Long term (current) use of anticoagulants: Secondary | ICD-10-CM

## 2018-05-16 HISTORY — DX: Aneurysm of the ascending aorta, without rupture: I71.21

## 2018-05-16 HISTORY — DX: Ischemic cardiomyopathy: I25.5

## 2018-05-16 HISTORY — DX: Presence of prosthetic heart valve: Z95.2

## 2018-05-16 HISTORY — DX: Atrioventricular block, second degree: I44.1

## 2018-05-16 HISTORY — DX: Thoracic aortic aneurysm, without rupture: I71.2

## 2018-05-16 HISTORY — DX: Atrial premature depolarization: I49.1

## 2018-05-16 HISTORY — DX: Radiographic dye allergy status: Z91.041

## 2018-05-16 HISTORY — DX: Ventricular premature depolarization: I49.3

## 2018-05-16 LAB — CBC
HEMATOCRIT: 42.3 % (ref 36.0–46.0)
Hemoglobin: 13.2 g/dL (ref 12.0–15.0)
MCH: 26.1 pg (ref 26.0–34.0)
MCHC: 31.2 g/dL (ref 30.0–36.0)
MCV: 83.8 fL (ref 78.0–100.0)
Platelets: 173 10*3/uL (ref 150–400)
RBC: 5.05 MIL/uL (ref 3.87–5.11)
RDW: 16.4 % — AB (ref 11.5–15.5)
WBC: 4.5 10*3/uL (ref 4.0–10.5)

## 2018-05-16 LAB — BASIC METABOLIC PANEL
Anion gap: 5 (ref 5–15)
BUN: 12 mg/dL (ref 8–23)
CO2: 25 mmol/L (ref 22–32)
CREATININE: 0.99 mg/dL (ref 0.44–1.00)
Calcium: 9.3 mg/dL (ref 8.9–10.3)
Chloride: 117 mmol/L — ABNORMAL HIGH (ref 98–111)
GFR calc Af Amer: >60 mL/min (ref >60)
GFR, EST NON AFRICAN AMERICAN: 59 mL/min — AB (ref >60)
GLUCOSE: 89 mg/dL (ref 70–99)
Potassium: 3.5 mmol/L (ref 3.5–5.1)
Sodium: 147 mmol/L — ABNORMAL HIGH (ref 135–145)

## 2018-05-16 LAB — PROTIME-INR
INR: 1.93
Prothrombin Time: 21.9 seconds — ABNORMAL HIGH (ref 11.4–15.2)

## 2018-05-16 LAB — TROPONIN I: Troponin I: 0.11 ng/mL (ref <0.03)

## 2018-05-16 LAB — I-STAT TROPONIN, ED: TROPONIN I, POC: 0.01 ng/mL (ref 0.00–0.08)

## 2018-05-16 MED ORDER — ASPIRIN EC 325 MG PO TBEC
325.0000 mg | DELAYED_RELEASE_TABLET | Freq: Once | ORAL | Status: AC
  Administered 2018-05-16: 325 mg via ORAL
  Filled 2018-05-16: qty 1

## 2018-05-16 NOTE — Progress Notes (Signed)
Spoke with admitting - bed request for direct admission placed for tomorrow for inpatient tele bed under Dr. Rennis Golden, requested AM. Bed control said they will call patient tomorrow AM as soon as bed is ready. Patient was notified and also asked to be NPO after midnight except sips with meds in case cath able to be done. Return precautions reviewed. Dr. Rennis Golden spoke with EDP. Dayna Dunn PA-C

## 2018-05-16 NOTE — ED Notes (Signed)
Critical Troponin reported to Dr. Leafy Ro. Ranae Palms.

## 2018-05-16 NOTE — Consult Note (Signed)
Cardiology Consultation    Patient ID: [name] MRN: [id number], DOB: 03-29-1951 Date of Encounter: 05/16/2018, 2:33 PM Primary Physician: [name], MD Primary Cardiologist: Chrystie Nose, MD  Chief Complaint: chest pain Reason for Consultation: elevated troponin Requesting MD: Dr. Drue Dun  HPI: [name] is a 67 y.o. female with history of aortic insufficiency s/p St. Jude AVR 2000,  subclavian artery stenosis s/p bypass 2001, CAD s/p CABGx2 (SVG-LAD and SVG-Cx) in 2014 with redo replacement of fusiform aneurysm of ascending aorta (complicated by pericardial hematoma s/p evacuation), NSTEMI 2017 with ICM 40-45% and cath resulting in PCI of SVG-LAD, EF normalized in 2018, PACs/PVCs, RA, sarcoidosis, dyslipidemia, contrast allergy (hives) who presents for eval of chest pain.  She describes what sounds like stable angina for many years but cannot really say when this started, possibly before she even had PCI in 06/2016 without significant change - usually this is exclusively with "rushing" or lifting heavy items. In 07/2016 she had a nonischemic nuc which was her last ischemic assessment. Last echo 12/2016 showed EF 60-65%, normal diastolic parameters and normal AV function, mild MR, mild LAE. Carotid duplex/subclavians 06/2016 were felt stable with repeat suggested 06/2019. AAA scan 06/2016 showed stable dimensions of the supraceliac AAA of the proximal aorta measuring 3.2 cm x 3.6 cm with recommended f/u 06/2017 which does not appear to have occurred. Last CTA 12/2014 showed stable changes of Bentall procedure, stable 4 cm aneurysmal dilatation of the distal ascending aorta and arch. AT visit 12/2016 her metoprolol was stopped due to 2nd degree heartblock and Isosorbide was reduced. I reviewed this EKG with Dr. Rennis Golden though as it's not clear this truly represented 2nd degree heart block. At some point she stopped isosorbide altogether. She was doing well at last OV 01/2018 without  complaints.  She works at the [employer] - due to a [employer], she's been working her 12th day straight today. She was in [employer] going to carry empty boxes when she developed chest pain similar to prior angina. She rested and when it did not go away she notified a Radio broadcast assistant. She felt sweaty but had been sweating beforehand anyway. No SOB, nausea, diaphoresis. She took 1 SL NTG which relieved the pain, but coworkers alerted EMS and the patient was transported to the hospital. She's not thrilled about their response in bringing her up to the hospital as she is hungry, wants to eat and wants to go home. Initial trop negative but f/u value 0.11. Na 147, K 0.99, INR 1.93. Reports compliance with meds. CXR without acute disease.  VSS. Tele shows sinus bradycardia with PACs/PVCs for which she is relatively asymptomatic. Initial EKS EKG did show subtle ST depression inferiorly and V5-V6. Her last dose of NTG otherwise was last week while carrying heavy boxes.   Past Medical History:  Diagnosis Date  . Ascending aortic aneurysm (HCC)    a.  redo replacement of fusiform aneurysm of ascending aorta (complicated by pericardial hematoma s/p evacuation) in 2014.  Marland Kitchen Contrast media allergy   . Coronary artery disease    a. s/p CABG x2 in 2014. b. s/p DES to SVG-LAD in 2017.  . H/O mechanical aortic valve replacement   . Hyperlipidemia   . Hypertension   . Ischemic cardiomyopathy    a. EF 40-45% in 2017 at time of NSTEMI, improved to normal in 2018.  . Non-STEMI (non-ST elevated myocardial infarction) (HCC)   . Premature atrial contractions   . PVC's (premature ventricular contractions)   .  RA (rheumatoid arthritis) (HCC)   . S/P AVR (aortic valve replacement) 08/1999   St. Jude AVR for aortic insuff, Goal INR 2.5-3.5  . 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 Donata Clay) - complicated by pericardial hematoma -->  subxiphoid pericardial exposure and evacuation b. Cath 06/2016: DES to SVG-LAD ostium  . Sarcoid   . Severe anemia   . 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 s/p left subclavian bypass.     Surgical History:  Past Surgical History:  Procedure Laterality Date  . AORTIC VALVE REPLACEMENT  08/23/1999   23mm St. Jude, Bentall procedure, root replacemtn   . CARDIAC CATHETERIZATION  08/1999   normal L main, LAD & first diagonal are normal, normal Cfx, RCA with mild ostial narrowing, normal LV systolic function, severe AI, mild MR, normal PAP (Dr. Mervyn Skeeters. Little)   . CARDIAC CATHETERIZATION N/A 06/22/2016   Procedure: Left Heart Cath and Cors/Grafts Angiography;  Surgeon: Marykay Lex, MD;  Location: [facility] INVASIVE CV LAB;  Service: Cardiovascular;  Laterality: N/A;  . CARDIAC CATHETERIZATION N/A 06/23/2016   Procedure: Left Heart Cath and Coronary Angiography;  Surgeon: Tonny Bollman, MD;  Location: [facility] INVASIVE CV LAB;  Service: Cardiovascular;  Laterality: N/A;  . CARDIAC CATHETERIZATION N/A 06/23/2016   Procedure: Coronary Stent Intervention;  Surgeon: Tonny Bollman, MD;  Location: [facility] INVASIVE CV LAB;  Service: Cardiovascular;  Laterality: N/A;  . CORONARY ARTERY BYPASS GRAFT  11/05/2012   Procedure: CORONARY ARTERY BYPASS GRAFTING (CABG);  Surgeon: Kerin Perna, MD;  Location: [facility] OR;  Service: Open Heart Surgery;  Laterality: N/A;  cannulate right subclavian  . ENDOVEIN HARVEST OF GREATER SAPHENOUS VEIN  11/05/2012   Procedure: ENDOVEIN HARVEST OF GREATER SAPHENOUS VEIN;  Surgeon: Kerin Perna, MD;  Location: [facility] OR;  Service: Open Heart Surgery;  Laterality: Right;  . INTRAOPERATIVE TRANSESOPHAGEAL ECHOCARDIOGRAM  11/05/2012   Procedure: INTRAOPERATIVE TRANSESOPHAGEAL ECHOCARDIOGRAM;  Surgeon: Kerin Perna, MD;  Location: [facility] OR;  Service: Open Heart Surgery;  Laterality: N/A;  . INTRAOPERATIVE TRANSESOPHAGEAL ECHOCARDIOGRAM N/A 11/21/2012   Procedure: INTRAOPERATIVE TRANSESOPHAGEAL  ECHOCARDIOGRAM;  Surgeon: Kerin Perna, MD;  Location: [facility] OR;  Service: Open Heart Surgery;  Laterality: N/A;  . LEFT HEART CATHETERIZATION WITH CORONARY ANGIOGRAM N/A 11/04/2012   Procedure: LEFT HEART CATHETERIZATION WITH CORONARY ANGIOGRAM;  Surgeon: Chrystie Nose, MD;  Location: [facility] CATH LAB;  Service: Cardiovascular;  Laterality: N/A;  . MEDIASTINOSCOPY  08/2005   bronchoscopy & mediastinoscopy for mediastinal adenopathy (Dr. Donata Clay)   . NM MYOCAR PERF WALL MOTION  01/2012   lexiscan; small, fixed apical septal breast attenuation artifact & inferolateral bowel attenuation, no reversible ischemia, post-stress EF 64%, low risk scan   . PERICARDIAL WINDOW N/A 11/21/2012   Procedure: PERICARDIAL WINDOW;  Surgeon: Kerin Perna, MD;  Location: [facility] OR;  Service: Thoracic;  Laterality: N/A;  . REPLACEMENT ASCENDING AORTA  11/05/2012   Procedure: REPLACEMENT ASCENDING AORTA;  Surgeon: Kerin Perna, MD;  Location: [facility] OR;  Service: Open Heart Surgery;  Laterality: N/A;  . SUBCLAVIAN ANGIOGRAM  05/10/2000   left subclavian arteriogram; patent graft, mild stenosis (Dr. Amada Kingfisher)   . SUBCLAVIAN BYPASS GRAFT  05/16/1999   left subclavian bypass with 7mm Darcon graft (Dr. Amada Kingfisher)  . TRANSTHORACIC ECHOCARDIOGRAM  11/20/2012   EF 65-70%, mild conc LVH, grade 2 diastolic dysfunction, mech AV, mild MR  Home Meds: Prior to Admission medications   Medication Sig Start Date End Date Taking? Authorizing Provider  atorvastatin (LIPITOR) 80 MG tablet TAKE 1 TABLET (80 MG TOTAL) BY MOUTH DAILY AT 6 PM. 12/18/17  Yes Hilty, Lisette Abu, MD  Calcium Carb-Cholecalciferol (CALCIUM 600+D3 PO) Take 2,000 Units by mouth.   Yes [provider]  clopidogrel (PLAVIX) 75 MG tablet TAKE 1 TABLET BY MOUTH DAILY WITH BREAKFAST Patient taking differently: Take 75 mg by mouth daily.  02/14/18  Yes Hilty, Lisette Abu, MD  Diclofenac Sodium 3 % GEL Place 1 application onto the skin 2 (two) times daily. To affected  area. 12/29/16  Yes Everlene Farrier, PA-C  lisinopril (PRINIVIL,ZESTRIL) 10 MG tablet Take 1 tablet (10 mg total) by mouth daily. 08/03/17 05/16/18 Yes Hilty, Lisette Abu, MD  Multiple Vitamin (MULTIVITAMIN WITH MINERALS) TABS tablet Take 1 tablet by mouth daily.   Yes [provider]  nitroGLYCERIN (NITROSTAT) 0.4 MG SL tablet PLACE 1 TABLET (0.4 MG TOTAL) UNDER THE TONGUE EVERY 5 (FIVE) MINUTES AS NEEDED FOR CHEST PAIN. 02/13/18  Yes Hilty, Lisette Abu, MD  pantoprazole (PROTONIX) 40 MG tablet TAKE 1 TABLET BY MOUTH EVERY DAY 04/29/18  Yes Hilty, Lisette Abu, MD  polyvinyl alcohol (ARTIFICIAL TEARS) 1.4 % ophthalmic solution Place 1-2 drops into both eyes daily as needed for dry eyes.   Yes [provider]  warfarin (COUMADIN) 5 MG tablet TAKE 1/2 TO 1 TABLET BY MOUTH DAILY OR AS DIRECTED BY COUMADIN CLINIC Patient taking differently: TAKE 1/2 TO 1 TABLET BY MOUTH DAILY OR AS DIRECTED BY COUMADIN CLINIC 03/18/18  Yes Hilty, Lisette Abu, MD  aspirin EC 81 MG EC tablet Take 1 tablet (81 mg total) by mouth daily. Patient not taking: Reported on 05/16/2018 06/26/16   Ellsworth Lennox, PA-C  diazepam (VALIUM) 2 MG tablet Take 1 tablet (2 mg total) by mouth every 8 (eight) hours as needed for muscle spasms. Patient not taking: Reported on 05/16/2018 12/29/16   Everlene Farrier, PA-C  fluticasone [facility]) 50 MCG/ACT nasal spray Place 1 spray into both nostrils daily. Patient not taking: Reported on 05/16/2018 11/20/17   Graciella Freer A, PA-C  isosorbide mononitrate (IMDUR) 30 MG 24 hr tablet TAKE 1 TABLET BY MOUTH EVERY DAY Patient not taking: Reported on 05/16/2018 11/15/17   Barrett, Joline Salt, PA-C  ondansetron (ZOFRAN) 4 MG tablet Take 1 tablet (4 mg total) by mouth every 6 (six) hours. Patient not taking: Reported on 05/16/2018 11/20/17   Maxwell Caul, PA-C  oseltamivir (TAMIFLU) 75 MG capsule Take 1 capsule (75 mg total) by mouth every 12 (twelve) hours. Patient not taking: Reported on  02/22/2018 11/20/17   Maxwell Caul, PA-C    Allergies:  Allergies  Allergen Reactions  . Contrast Media [Iodinated Diagnostic Agents] Hives, Itching and Swelling    Social History   Socioeconomic History  . Marital status: Single    Spouse name: Not on file  . Number of children: 2  . Years of education: 38  . Highest education level: Not on file  Occupational History  . Occupation: Distribution center    Employer: [employer]  Social Needs  . Financial resource strain: Not on file  . Food insecurity:    Worry: Not on file    Inability: Not on file  . Transportation needs:    Medical: Not on file    Non-medical: Not on file  Tobacco Use  . Smoking status: Never Smoker  . Smokeless tobacco:  Never Used  Substance and Sexual Activity  . Alcohol use: No  . Drug use: No  . Sexual activity: Not on file  Lifestyle  . Physical activity:    Days per week: Not on file    Minutes per session: Not on file  . Stress: Not on file  Relationships  . Social connections:    Talks on phone: Not on file    Gets together: Not on file    Attends religious service: Not on file    Active member of club or organization: Not on file    Attends meetings of clubs or organizations: Not on file    Relationship status: Not on file  . Intimate partner violence:    Fear of current or ex partner: Not on file    Emotionally abused: Not on file    Physically abused: Not on file    Forced sexual activity: Not on file  Other Topics Concern  . Not on file  Social History Narrative  . Not on file     Family History  Problem Relation Age of Onset  . Hypertension Mother   . Colon cancer Neg Hx   . Esophageal cancer Neg Hx   . Pancreatic cancer Neg Hx   . Stomach cancer Neg Hx   . Liver disease Neg Hx     Review of Systems: no bleeding, syncope, palpitations All other systems reviewed and are otherwise negative except as noted above.  Labs:   Lab Results  Component Value Date    WBC 4.5 05/16/2018   HGB 13.2 05/16/2018   HCT 42.3 05/16/2018   MCV 83.8 05/16/2018   PLT 173 05/16/2018    Recent Labs  Lab 05/16/18 0907  NA 147*  K 3.5  CL 117*  CO2 25  BUN 12  CREATININE 0.99  CALCIUM 9.3  GLUCOSE 89   Recent Labs    05/16/18 1230  TROPONINI 0.11*   Lab Results  Component Value Date   CHOL 134 02/22/2018   HDL 84 02/22/2018   LDLCALC 42 02/22/2018   TRIG 40 02/22/2018   No results found for: DDIMER  Radiology/Studies:  Dg Chest 2 View  Result Date: 05/16/2018 CLINICAL DATA:  Chest pain. EXAM: CHEST - 2 VIEW COMPARISON:  Radiographs of November 19, 2017. FINDINGS: Stable cardiomediastinal silhouette. Status post coronary artery bypass graft. Atherosclerosis of thoracic aorta is noted. No pneumothorax or pleural effusion is noted. Both lungs are clear. The visualized skeletal structures are unremarkable. IMPRESSION: No active cardiopulmonary disease. Aortic Atherosclerosis (ICD10-I70.0). Electronically Signed   By: Lupita Raider, M.D.   On: 05/16/2018 09:31   Wt Readings from Last 3 Encounters:  02/22/18 72 kg  11/19/17 71.2 kg  08/03/17 74.8 kg    EKG: EKG tracing: sinus arrhythmia with possible LVH, inferior ST-T changes as well as V5-V6 with subtle ST depression. Inferior changes more prominent than prior tracings.  Physical Exam: Blood pressure (!) 147/59, pulse (!) 44, temperature 97.7 F (36.5 C), temperature source Oral, resp. rate 16, SpO2 100 %. There is no height or weight on file to calculate BMI. General: Well developed, well nourished AAF in no acute distress. Head: Normocephalic, atraumatic, sclera non-icteric, no xanthomas, nares are without discharge.  Neck: Negative for carotid bruits. JVD not elevated. Lungs: Clear bilaterally to auscultation without wheezes, rales, or rhonchi. Breathing is unlabored. Heart: Irregular (ectopy), rate controlled, 2/6 SEM heard best at LSB with crisp valve click, no rubs or gallops appreciated.  Sternal scar well healed. Abdomen: Soft, non-tender, non-distended with normoactive bowel sounds. No hepatomegaly. No rebound/guarding. No obvious abdominal masses. Msk:  Strength and tone appear normal for age. Extremities: No clubbing or cyanosis. No edema.  Distal pedal pulses are 2+ and equal bilaterally. Neuro: Alert and oriented X 3. No focal deficit. No facial asymmetry. Moves all extremities spontaneously. Psych:  Responds to questions appropriately with a normal affect.    Assessment and Plan   1. Angina pectoris/possible NSTEMI with known history of CAD - concerning for ischemic chest pain. Initial troponin negative but follow-up value is 0.11. She had relatively low troponins when seen for NSTEMI in 2017 as well. She is adamant that she'd like to go home. If her troponin was not positive this might be reasonable with plan to escalate medical therapy, but I am worried about an acute coronary syndrome. INR is 1.9. She is on Plavix and warfarin. I will review with Dr. Rennis Golden.  2. Aortic insufficiency with mechanical AVR 2000 on Coumadin with subtherapeutic INR - management of anticoagulation will depend on assessment of the above.  3. Ascending aortic aneurysm s/p repair 2014 with AAA as well - last assessed 2016-2017, overdue for reassessment but no acute sx to suspect acute dissection.  4. Subclavian bypass history - repeat duplex was suggested for 06/2017.  5. PACs/PVCs - noted in the past as well, asymptomatic.  6. Hypernatremia - ? Related to fluid losses. Patient reports a lot of sweating at work today. Can follow.  7. HTN - controlled.  8. Hyperlipidemia - continue statin.   For questions or updates, please contact [facility] Please consult [email or web address] for contact info under Cardiology/STEMI.  Signed, Laurann Montana, PA-C 05/16/2018, 2:33 PM

## 2018-05-16 NOTE — ED Notes (Signed)
Pt ambulated to restroom with ease, steady gait, no pain.

## 2018-05-16 NOTE — ED Provider Notes (Addendum)
[facility] EMERGENCY DEPARTMENT Provider Note   CSN: [id number] Arrival date & time: 05/16/18  0847     History   Chief Complaint Chief Complaint  Patient presents with  . Chest Pain    HPI [name] is a 67 y.o. female is a 67 year old female with a history of CAD status post stents and CABG who presents due to 1 day of chest pain.  She says that she was moving heavy boxes at work when she developed sudden onset left-sided chest tightness that was severe.  It was not alleviated by rest.  She denies shortness of breath, nausea, and diaphoresis.  She took 1 of her nitroglycerin, which alleviated her symptoms.  She said this does not feel like her prior MIs.  She has no history of VTE.  She says that she is currently pain-free.  She says her chest is not tender to palpation.  HPI  Past Medical History:  Diagnosis Date  . Ascending aortic aneurysm (HCC)    a.  redo replacement of fusiform aneurysm of ascending aorta (complicated by pericardial hematoma s/p evacuation) in 2014.  Marland Kitchen Contrast media allergy   . Coronary artery disease    a. s/p CABG x2 in 2014. b. s/p DES to SVG-LAD in 2017.  . H/O mechanical aortic valve replacement   . Hyperlipidemia   . Hypertension   . Ischemic cardiomyopathy    a. EF 40-45% in 2017 at time of NSTEMI, improved to normal in 2018.  . Non-STEMI (non-ST elevated myocardial infarction) (HCC)   . Premature atrial contractions   . PVC's (premature ventricular contractions)   . RA (rheumatoid arthritis) (HCC)   . S/P AVR (aortic valve replacement) 08/1999   St. Jude AVR for aortic insuff, Goal INR 2.5-3.5  . 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 Donata Clay) - complicated by pericardial hematoma --> subxiphoid pericardial exposure and evacuation b. Cath 06/2016: DES to SVG-LAD ostium  . Sarcoid   . Second degree AV block    a. ? possibly seen in  12/2016 clinic visit - EKG not truly suggestive  . Severe anemia   . 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 s/p left subclavian bypass.    Patient Active Problem List   Diagnosis Date Noted  . 2nd degree AV block 01/12/2017  . DOE (dyspnea on exertion) 01/12/2017  . Encounter for long-term (current) use of medications 01/12/2017  . Acute blood loss anemia 08/15/2016  . Black stools 08/15/2016  . Aortic valve disease 07/03/2016  . Cardiomyopathy, ischemic 07/03/2016  . Hx of CABG   . NSTEMI (non-ST elevated myocardial infarction) (HCC) 06/20/2016  . Acute pericardial effusion post op without tamponade - S/P subxiphoid pericaridal window 11/21/12 11/21/2012  . PVD , known Lt SCA stenosis 11/21/2012  . Pleural effusion, RT, post op 11/21/2012  . Unstable angina on admission 11/04/12 11/04/2012  . Coronary artery disease involving native coronary artery of native heart without angina pectoris 11/04/2012  . S/P AVR (aortic valve replacement) 11/04/2012  . Dilated ascending aorta at cath 11/03/12 - S/P root replacement 11/05/12 11/04/2012  . Chronic anticoagulation for mechanical AVR 11/04/2012  . Contrast media allergy 11/04/2012  . Essential hypertension 11/04/2012  . Sarcoidosis 11/04/2012    Past Surgical History:  Procedure Laterality Date  . AORTIC VALVE REPLACEMENT  08/23/1999   1mm St. Jude, [place] procedure, root replacemtn   .  CARDIAC CATHETERIZATION  08/1999   normal L main, LAD & first diagonal are normal, normal Cfx, RCA with mild ostial narrowing, normal LV systolic function, severe AI, mild MR, normal PAP (Dr. Mervyn Skeeters. [place])   . CARDIAC CATHETERIZATION N/A 06/22/2016   Procedure: Left Heart Cath and Cors/Grafts Angiography;  Surgeon: Marykay Lex, MD;  Location: [facility] INVASIVE CV LAB;  Service: Cardiovascular;  Laterality: N/A;  . CARDIAC CATHETERIZATION N/A 06/23/2016   Procedure: Left Heart Cath and Coronary Angiography;  Surgeon: Tonny Bollman, MD;  Location: [facility]  INVASIVE CV LAB;  Service: Cardiovascular;  Laterality: N/A;  . CARDIAC CATHETERIZATION N/A 06/23/2016   Procedure: Coronary Stent Intervention;  Surgeon: Tonny Bollman, MD;  Location: [facility] INVASIVE CV LAB;  Service: Cardiovascular;  Laterality: N/A;  . CORONARY ARTERY BYPASS GRAFT  11/05/2012   Procedure: CORONARY ARTERY BYPASS GRAFTING (CABG);  Surgeon: Kerin Perna, MD;  Location: [facility] OR;  Service: Open Heart Surgery;  Laterality: N/A;  cannulate right subclavian  . ENDOVEIN HARVEST OF GREATER SAPHENOUS VEIN  11/05/2012   Procedure: ENDOVEIN HARVEST OF GREATER SAPHENOUS VEIN;  Surgeon: Kerin Perna, MD;  Location: [facility] OR;  Service: Open Heart Surgery;  Laterality: Right;  . INTRAOPERATIVE TRANSESOPHAGEAL ECHOCARDIOGRAM  11/05/2012   Procedure: INTRAOPERATIVE TRANSESOPHAGEAL ECHOCARDIOGRAM;  Surgeon: Kerin Perna, MD;  Location: [facility] OR;  Service: Open Heart Surgery;  Laterality: N/A;  . INTRAOPERATIVE TRANSESOPHAGEAL ECHOCARDIOGRAM N/A 11/21/2012   Procedure: INTRAOPERATIVE TRANSESOPHAGEAL ECHOCARDIOGRAM;  Surgeon: Kerin Perna, MD;  Location: [facility] OR;  Service: Open Heart Surgery;  Laterality: N/A;  . LEFT HEART CATHETERIZATION WITH CORONARY ANGIOGRAM N/A 11/04/2012   Procedure: LEFT HEART CATHETERIZATION WITH CORONARY ANGIOGRAM;  Surgeon: Chrystie Nose, MD;  Location: [facility] CATH LAB;  Service: Cardiovascular;  Laterality: N/A;  . MEDIASTINOSCOPY  08/2005   bronchoscopy & mediastinoscopy for mediastinal adenopathy (Dr. Donata Clay)   . NM MYOCAR PERF WALL MOTION  01/2012   lexiscan; small, fixed apical septal breast attenuation artifact & inferolateral bowel attenuation, no reversible ischemia, post-stress EF 64%, low risk scan   . PERICARDIAL WINDOW N/A 11/21/2012   Procedure: PERICARDIAL WINDOW;  Surgeon: Kerin Perna, MD;  Location: [facility] OR;  Service: Thoracic;  Laterality: N/A;  . REPLACEMENT ASCENDING AORTA  11/05/2012   Procedure: REPLACEMENT ASCENDING AORTA;  Surgeon: Kerin Perna, MD;   Location: [facility] OR;  Service: Open Heart Surgery;  Laterality: N/A;  . SUBCLAVIAN ANGIOGRAM  05/10/2000   left subclavian arteriogram; patent graft, mild stenosis (Dr. Amada Kingfisher)   . SUBCLAVIAN BYPASS GRAFT  05/16/1999   left subclavian bypass with 7mm Darcon graft (Dr. Amada Kingfisher)  . TRANSTHORACIC ECHOCARDIOGRAM  11/20/2012   EF 65-70%, mild conc LVH, grade 2 diastolic dysfunction, mech AV, mild MR     OB History   None      Home Medications    Prior to Admission medications   Medication Sig Start Date End Date Taking? Authorizing Provider  atorvastatin (LIPITOR) 80 MG tablet TAKE 1 TABLET (80 MG TOTAL) BY MOUTH DAILY AT 6 PM. 12/18/17  Yes Hilty, Lisette Abu, MD  Calcium Carb-Cholecalciferol (CALCIUM 600+D3 PO) Take 2,000 Units by mouth.   Yes [provider]  clopidogrel (PLAVIX) 75 MG tablet TAKE 1 TABLET BY MOUTH DAILY WITH BREAKFAST Patient taking differently: Take 75 mg by mouth daily.  02/14/18  Yes Hilty, Lisette Abu, MD  Diclofenac Sodium 3 % GEL Place 1 application onto the skin 2 (two) times daily. To affected area. 12/29/16  Yes Everlene Farrier, PA-C  lisinopril (PRINIVIL,ZESTRIL) 10 MG tablet Take 1 tablet (10 mg total) by mouth daily. 08/03/17 05/16/18 Yes Hilty, Lisette Abu, MD  Multiple Vitamin (MULTIVITAMIN WITH MINERALS) TABS tablet Take 1 tablet by mouth daily.   Yes [provider]  nitroGLYCERIN (NITROSTAT) 0.4 MG SL tablet PLACE 1 TABLET (0.4 MG TOTAL) UNDER THE TONGUE EVERY 5 (FIVE) MINUTES AS NEEDED FOR CHEST PAIN. 02/13/18  Yes Hilty, Lisette Abu, MD  pantoprazole (PROTONIX) 40 MG tablet TAKE 1 TABLET BY MOUTH EVERY DAY 04/29/18  Yes Hilty, Lisette Abu, MD  polyvinyl alcohol (ARTIFICIAL TEARS) 1.4 % ophthalmic solution Place 1-2 drops into both eyes daily as needed for dry eyes.   Yes [provider]  warfarin (COUMADIN) 5 MG tablet TAKE 1/2 TO 1 TABLET BY MOUTH DAILY OR AS DIRECTED BY COUMADIN CLINIC Patient taking differently: TAKE 1/2 TO 1 TABLET BY MOUTH  DAILY OR AS DIRECTED BY COUMADIN CLINIC 03/18/18  Yes Hilty, Lisette Abu, MD  aspirin EC 81 MG EC tablet Take 1 tablet (81 mg total) by mouth daily. Patient not taking: Reported on 05/16/2018 06/26/16   Ellsworth Lennox, PA-C  diazepam (VALIUM) 2 MG tablet Take 1 tablet (2 mg total) by mouth every 8 (eight) hours as needed for muscle spasms. Patient not taking: Reported on 05/16/2018 12/29/16   Everlene Farrier, PA-C  fluticasone [facility]) 50 MCG/ACT nasal spray Place 1 spray into both nostrils daily. Patient not taking: Reported on 05/16/2018 11/20/17   Graciella Freer A, PA-C  isosorbide mononitrate (IMDUR) 30 MG 24 hr tablet TAKE 1 TABLET BY MOUTH EVERY DAY Patient not taking: Reported on 05/16/2018 11/15/17   Barrett, Joline Salt, PA-C  ondansetron (ZOFRAN) 4 MG tablet Take 1 tablet (4 mg total) by mouth every 6 (six) hours. Patient not taking: Reported on 05/16/2018 11/20/17   Maxwell Caul, PA-C  oseltamivir (TAMIFLU) 75 MG capsule Take 1 capsule (75 mg total) by mouth every 12 (twelve) hours. Patient not taking: Reported on 02/22/2018 11/20/17   Maxwell Caul, PA-C    Family History Family History  Problem Relation Age of Onset  . Hypertension Mother   . Colon cancer Neg Hx   . Esophageal cancer Neg Hx   . Pancreatic cancer Neg Hx   . Stomach cancer Neg Hx   . Liver disease Neg Hx     Social History Social History   Tobacco Use  . Smoking status: Never Smoker  . Smokeless tobacco: Never Used  Substance Use Topics  . Alcohol use: No  . Drug use: No     Allergies   Contrast media [iodinated diagnostic agents]   Review of Systems Review of Systems Review of Systems   Constitutional  Negative for fever  Negative for chills  HENT  Negative for ear pain  Negative for sore throat  Negative for difficultly swallowing  Eyes  Negative for eye pain  Negative for visual disturbance  Respiratory  Negative for shortness of breath  Negative for cough  CV  +for chest  pain  Negative for leg swelling  Abdomen  Negative for abdominal pain  Negative for nausea  Negative for vomiting  MSK  Negative for extremity pain  Negative for back pain  Skin  Negative for rash  Negative for wound  Neuro  Negative for syncope  Negative for difficultly speaking  Psych  Negative for confusion   The remainder of the ROS was reviewed and negative except as documented above.  Physical Exam Updated Vital Signs BP (!) 158/63   Pulse (!) 52   Temp 97.7 F (36.5 C) (Oral)   Resp 16   SpO2 98%   Physical Exam Physical Exam Constitutional  Nursing notes reviewed  Vital signs reviewed  HEENT  No obvious trauma  Supple without meningismus, mass, or overt JVD  EOMI  No scleral icterus or injection  Respiratory  Effort normal  CTAB  No respiratory distress  CV  Normal rate  No obvious murmurs  No pitting edema  Equal pulses in all extremities  Chest not tender to palpation  Abdomen  Soft  Non-tender  Non-distended  No peritonitis  MSK  Atraumatic  No obvious deformity  ROM appropriate  Skin  Warm  Dry  Prior CABG scar  Neuro  Awake and alert  EOMI  Moving all extremities  Denies numbness/tingling  Psychiatric  Mood and affect normal        ED Treatments / Results  Labs (all labs ordered are listed, but only abnormal results are displayed) Labs Reviewed  BASIC METABOLIC PANEL - Abnormal; Notable for the following components:      Result Value   Sodium 147 (*)    Chloride 117 (*)    GFR calc non Af Amer 59 (*)    All other components within normal limits  CBC - Abnormal; Notable for the following components:   RDW 16.4 (*)    All other components within normal limits  PROTIME-INR - Abnormal; Notable for the following components:   Prothrombin Time 21.9 (*)    All other components within normal limits  TROPONIN I - Abnormal; Notable for the following components:   Troponin I 0.11 (*)     All other components within normal limits  I-STAT TROPONIN, ED    EKG EKG Interpretation  Date/Time:  Thursday May 16 2018 08:55:19 EDT Ventricular Rate:  58 PR Interval:    QRS Duration: 117 QT Interval:  431 QTC Calculation: 424 R Axis:   82 Text Interpretation:  Sinus rhythm Multiple premature complexes, vent & supraven Probable left ventricular hypertrophy Confirmed by Loren Racer ([id number]) on 05/16/2018 10:37:57 AM   Radiology Dg Chest 2 View  Result Date: 05/16/2018 CLINICAL DATA:  Chest pain. EXAM: CHEST - 2 VIEW COMPARISON:  Radiographs of November 19, 2017. FINDINGS: Stable cardiomediastinal silhouette. Status post coronary artery bypass graft. Atherosclerosis of thoracic aorta is noted. No pneumothorax or pleural effusion is noted. Both lungs are clear. The visualized skeletal structures are unremarkable. IMPRESSION: No active cardiopulmonary disease. Aortic Atherosclerosis (ICD10-I70.0). Electronically Signed   By: Lupita Raider, M.D.   On: 05/16/2018 09:31    Procedures Procedures (including critical care time)  Medications Ordered in ED Medications  aspirin EC tablet 325 mg (325 mg Oral Given 05/16/18 1053)     Initial Impression / Assessment and Plan / ED Course  I have reviewed the triage vital signs and the nursing notes.  Pertinent labs & imaging results that were available during my care of the patient were reviewed by me and considered in my medical decision making (see chart for details).  Clinical Course as of May 16 1512  Thu May 16, 2018  1112 [name] presents with exertional chest pain that was relieved by nitroglycerin as per above. She has an extensive cardiac history, including CABG x2.  I am concerned for ACS.  Her ECG reveals nonspecific ST changes laterally.  There are no signs of STEMI or acute dysrhythmia.  Her chest x-ray reveals no focal acute cardiopulmonary process.  Her presentation is not consistent with PE.  I do not think  that this is a PE or that further PE risk stratification is indicated.  I do not think that this is an aortic dissection or that she has a significant cardiac effusion.   [NA]  1113 She has no significant abnormalities on CBC or BMP.  Her initial troponin is not elevated.  Cardiology was consulted to discuss further care.   [NA]  1201 I discussed her care with Dr. Chilton Si in cardiology.  We will obtain another troponin in 3 hours.  If this is negative, Dr. Duke Salvia recommends outpatient follow-up.   [NA]  1335 Her second troponin is elevated.  I have re-paged cardiology.  They will evaluate her at bedside.  Troponin I(!!): 0.11 [NA]  Cardiology evaluated the patient.  They recommended admission for an NSTEMI.  However, the patient refused admission and would like to be discharged AMA.  I personally assessed the patient.  She has decision-making capacity.  She understands the risk and benefits of staying for treatment and also of leaving against medical advice.  She agrees to follow-up with cardiology in clinic.  I explained the risks of leaving include death, heart attack, and permanent disability.  She can verbalize the risk back to me. I provided return precautions.  Clinical Course User Index [NA] Talitha Givens, MD    Final Clinical Impressions(s) / ED Diagnoses   Final diagnoses:  Chest pain, unspecified type    ED Discharge Orders    None         Talitha Givens, MD 05/16/18 1556    Loren Racer, MD 05/22/18 2148

## 2018-05-16 NOTE — Discharge Instructions (Addendum)
[name]:  Thank you for allowing [place] to take care of you today.  We hope you begin feeling better soon.  To-Do: Please follow-up with your cardiologist Please return to the Emergency Department or call 911 if you experience chest pain, shortness of breath, severe pain, severe fever, altered mental status, or have any reason to think that you need emergency medical care.  Thank you again.  Hope you feel better soon.

## 2018-05-16 NOTE — ED Triage Notes (Signed)
Pt presents via [facility] for evaluation of chest pain starting this morning. Patient reports 4 years ago had bypass and stent 2 years ago. States took 1 nitro and had relief of pain. Patient takes plavix and coumadin, no asa given.

## 2018-05-16 NOTE — ED Notes (Signed)
Pt st's she feels fine and is ready to go home.

## 2018-05-17 ENCOUNTER — Inpatient Hospital Stay (HOSPITAL_COMMUNITY)
Admission: RE | Admit: 2018-05-17 | Discharge: 2018-05-24 | DRG: 270 | Disposition: A | Discharge status: Home / Self Care | Payer: Commercial Managed Care - PPO | Attending: Internal Medicine | Admitting: Internal Medicine

## 2018-05-17 ENCOUNTER — Telehealth: Payer: Self-pay | Admitting: Physician Assistant

## 2018-05-17 ENCOUNTER — Other Ambulatory Visit: Payer: Self-pay

## 2018-05-17 ENCOUNTER — Encounter (HOSPITAL_COMMUNITY): Payer: Self-pay | Admitting: General Practice

## 2018-05-17 DIAGNOSIS — I255 Ischemic cardiomyopathy: Secondary | ICD-10-CM | POA: Diagnosis present

## 2018-05-17 DIAGNOSIS — E785 Hyperlipidemia, unspecified: Secondary | ICD-10-CM | POA: Diagnosis present

## 2018-05-17 DIAGNOSIS — Z8679 Personal history of other diseases of the circulatory system: Secondary | ICD-10-CM

## 2018-05-17 DIAGNOSIS — E870 Hyperosmolality and hypernatremia: Secondary | ICD-10-CM | POA: Diagnosis present

## 2018-05-17 DIAGNOSIS — I9581 Postprocedural hypotension: Secondary | ICD-10-CM | POA: Diagnosis not present

## 2018-05-17 DIAGNOSIS — I2510 Atherosclerotic heart disease of native coronary artery without angina pectoris: Secondary | ICD-10-CM | POA: Diagnosis not present

## 2018-05-17 DIAGNOSIS — Z952 Presence of prosthetic heart valve: Secondary | ICD-10-CM | POA: Diagnosis not present

## 2018-05-17 DIAGNOSIS — I252 Old myocardial infarction: Secondary | ICD-10-CM

## 2018-05-17 DIAGNOSIS — Z951 Presence of aortocoronary bypass graft: Secondary | ICD-10-CM | POA: Diagnosis not present

## 2018-05-17 DIAGNOSIS — I491 Atrial premature depolarization: Secondary | ICD-10-CM | POA: Diagnosis present

## 2018-05-17 DIAGNOSIS — Z8249 Family history of ischemic heart disease and other diseases of the circulatory system: Secondary | ICD-10-CM | POA: Diagnosis not present

## 2018-05-17 DIAGNOSIS — Z7982 Long term (current) use of aspirin: Secondary | ICD-10-CM | POA: Diagnosis not present

## 2018-05-17 DIAGNOSIS — I25110 Atherosclerotic heart disease of native coronary artery with unstable angina pectoris: Secondary | ICD-10-CM | POA: Diagnosis present

## 2018-05-17 DIAGNOSIS — I214 Non-ST elevation (NSTEMI) myocardial infarction: Secondary | ICD-10-CM | POA: Diagnosis present

## 2018-05-17 DIAGNOSIS — Z7902 Long term (current) use of antithrombotics/antiplatelets: Secondary | ICD-10-CM

## 2018-05-17 DIAGNOSIS — T82855A Stenosis of coronary artery stent, initial encounter: Secondary | ICD-10-CM | POA: Diagnosis present

## 2018-05-17 DIAGNOSIS — D869 Sarcoidosis, unspecified: Secondary | ICD-10-CM | POA: Diagnosis present

## 2018-05-17 DIAGNOSIS — I352 Nonrheumatic aortic (valve) stenosis with insufficiency: Secondary | ICD-10-CM | POA: Diagnosis present

## 2018-05-17 DIAGNOSIS — M069 Rheumatoid arthritis, unspecified: Secondary | ICD-10-CM | POA: Diagnosis present

## 2018-05-17 DIAGNOSIS — Z7901 Long term (current) use of anticoagulants: Secondary | ICD-10-CM

## 2018-05-17 DIAGNOSIS — I493 Ventricular premature depolarization: Secondary | ICD-10-CM | POA: Diagnosis present

## 2018-05-17 DIAGNOSIS — Z955 Presence of coronary angioplasty implant and graft: Secondary | ICD-10-CM

## 2018-05-17 DIAGNOSIS — Z79899 Other long term (current) drug therapy: Secondary | ICD-10-CM | POA: Diagnosis not present

## 2018-05-17 DIAGNOSIS — R7989 Other specified abnormal findings of blood chemistry: Secondary | ICD-10-CM | POA: Diagnosis present

## 2018-05-17 DIAGNOSIS — R791 Abnormal coagulation profile: Secondary | ICD-10-CM | POA: Diagnosis present

## 2018-05-17 DIAGNOSIS — I10 Essential (primary) hypertension: Secondary | ICD-10-CM | POA: Diagnosis present

## 2018-05-17 DIAGNOSIS — Z91041 Radiographic dye allergy status: Secondary | ICD-10-CM | POA: Diagnosis present

## 2018-05-17 DIAGNOSIS — Y831 Surgical operation with implant of artificial internal device as the cause of abnormal reaction of the patient, or of later complication, without mention of misadventure at the time of the procedure: Secondary | ICD-10-CM | POA: Diagnosis present

## 2018-05-17 DIAGNOSIS — I340 Nonrheumatic mitral (valve) insufficiency: Secondary | ICD-10-CM | POA: Diagnosis not present

## 2018-05-17 LAB — HEPATIC FUNCTION PANEL
ALK PHOS: 67 U/L (ref 38–126)
ALT: 31 U/L (ref 0–44)
AST: 29 U/L (ref 15–41)
Albumin: 3.5 g/dL (ref 3.5–5.0)
BILIRUBIN DIRECT: 0.3 mg/dL — AB (ref 0.0–0.2)
BILIRUBIN INDIRECT: 1.2 mg/dL — AB (ref 0.3–0.9)
Total Bilirubin: 1.5 mg/dL — ABNORMAL HIGH (ref 0.3–1.2)
Total Protein: 6 g/dL — ABNORMAL LOW (ref 6.5–8.1)

## 2018-05-17 LAB — CBC
HEMATOCRIT: 38.5 % (ref 36.0–46.0)
HEMOGLOBIN: 12.3 g/dL (ref 12.0–15.0)
MCH: 25.7 pg — AB (ref 26.0–34.0)
MCHC: 31.9 g/dL (ref 30.0–36.0)
MCV: 80.4 fL (ref 78.0–100.0)
Platelets: 184 10*3/uL (ref 150–400)
RBC: 4.79 MIL/uL (ref 3.87–5.11)
RDW: 15.9 % — ABNORMAL HIGH (ref 11.5–15.5)
WBC: 4.4 10*3/uL (ref 4.0–10.5)

## 2018-05-17 LAB — BASIC METABOLIC PANEL
ANION GAP: 8 (ref 5–15)
BUN: 10 mg/dL (ref 8–23)
CHLORIDE: 113 mmol/L — AB (ref 98–111)
CO2: 23 mmol/L (ref 22–32)
Calcium: 9.2 mg/dL (ref 8.9–10.3)
Creatinine, Ser: 0.91 mg/dL (ref 0.44–1.00)
GFR calc Af Amer: >60 mL/min (ref >60)
GFR calc non Af Amer: >60 mL/min (ref >60)
GLUCOSE: 79 mg/dL (ref 70–99)
POTASSIUM: 3.5 mmol/L (ref 3.5–5.1)
Sodium: 144 mmol/L (ref 135–145)

## 2018-05-17 LAB — LIPID PANEL
CHOL/HDL RATIO: 1.6 ratio
Cholesterol: 121 mg/dL (ref 0–200)
HDL: 74 mg/dL (ref >40)
LDL CALC: 41 mg/dL (ref 0–99)
Triglycerides: 32 mg/dL (ref <150)
VLDL: 6 mg/dL (ref 0–40)

## 2018-05-17 LAB — TROPONIN I: Troponin I: <0.03 ng/mL (ref <0.03)

## 2018-05-17 LAB — HEPARIN LEVEL (UNFRACTIONATED): Heparin Unfractionated: 0.34 IU/mL (ref 0.30–0.70)

## 2018-05-17 LAB — TSH: TSH: 1.91 u[IU]/mL (ref 0.350–4.500)

## 2018-05-17 LAB — PROTIME-INR
INR: 1.92
Prothrombin Time: 21.8 seconds — ABNORMAL HIGH (ref 11.4–15.2)

## 2018-05-17 MED ORDER — ATORVASTATIN CALCIUM 80 MG PO TABS
80.0000 mg | ORAL_TABLET | Freq: Every day | ORAL | Status: DC
  Administered 2018-05-17 – 2018-05-23 (×7): 80 mg via ORAL
  Filled 2018-05-17 (×7): qty 1

## 2018-05-17 MED ORDER — DIPHENHYDRAMINE HCL 25 MG PO CAPS
50.0000 mg | ORAL_CAPSULE | Freq: Once | ORAL | Status: AC
  Administered 2018-05-20: 50 mg via ORAL
  Filled 2018-05-17: qty 2

## 2018-05-17 MED ORDER — SODIUM CHLORIDE 0.9% FLUSH: 3.0000 mL | INTRAVENOUS | Status: DC | PRN

## 2018-05-17 MED ORDER — DIPHENHYDRAMINE HCL 50 MG/ML IJ SOLN: 50.0000 mg | Freq: Once | INTRAMUSCULAR | Status: AC

## 2018-05-17 MED ORDER — ASPIRIN 81 MG PO CHEW
81.0000 mg | CHEWABLE_TABLET | ORAL | Status: AC
  Administered 2018-05-20: 81 mg via ORAL
  Filled 2018-05-17: qty 1

## 2018-05-17 MED ORDER — HEPARIN (PORCINE) IN NACL 100-0.45 UNIT/ML-% IJ SOLN
800.0000 [IU]/h | INTRAMUSCULAR | Status: DC
  Administered 2018-05-17: 1000 [IU]/h via INTRAVENOUS
  Administered 2018-05-18: 900 [IU]/h via INTRAVENOUS
  Administered 2018-05-19: 800 [IU]/h via INTRAVENOUS
  Filled 2018-05-17 (×3): qty 250

## 2018-05-17 MED ORDER — SODIUM CHLORIDE 0.9 % WEIGHT BASED INFUSION
3.0000 mL/kg/h | INTRAVENOUS | Status: AC
  Administered 2018-05-20: 3 mL/kg/h via INTRAVENOUS

## 2018-05-17 MED ORDER — SODIUM CHLORIDE 0.9% FLUSH
3.0000 mL | Freq: Two times a day (BID) | INTRAVENOUS | Status: DC
  Administered 2018-05-17 – 2018-05-19 (×3): 3 mL via INTRAVENOUS

## 2018-05-17 MED ORDER — PANTOPRAZOLE SODIUM 40 MG PO TBEC
40.0000 mg | DELAYED_RELEASE_TABLET | Freq: Every day | ORAL | Status: DC
  Administered 2018-05-17 – 2018-05-24 (×7): 40 mg via ORAL
  Filled 2018-05-17 (×7): qty 1

## 2018-05-17 MED ORDER — SODIUM CHLORIDE 0.9 % IV SOLN: 250.0000 mL | INTRAVENOUS | Status: DC | PRN

## 2018-05-17 MED ORDER — SODIUM CHLORIDE 0.9% FLUSH
3.0000 mL | Freq: Two times a day (BID) | INTRAVENOUS | Status: DC
  Administered 2018-05-20: 3 mL via INTRAVENOUS

## 2018-05-17 MED ORDER — CLOPIDOGREL BISULFATE 75 MG PO TABS
75.0000 mg | ORAL_TABLET | Freq: Every day | ORAL | Status: DC
  Administered 2018-05-17 – 2018-05-21 (×5): 75 mg via ORAL
  Filled 2018-05-17 (×5): qty 1

## 2018-05-17 MED ORDER — PREDNISONE 20 MG PO TABS
50.0000 mg | ORAL_TABLET | Freq: Four times a day (QID) | ORAL | Status: AC
  Administered 2018-05-19 – 2018-05-20 (×3): 50 mg via ORAL
  Filled 2018-05-17 (×3): qty 2

## 2018-05-17 MED ORDER — POLYVINYL ALCOHOL 1.4 % OP SOLN
1.0000 [drp] | Freq: Every day | OPHTHALMIC | Status: DC | PRN
  Filled 2018-05-17: qty 15

## 2018-05-17 MED ORDER — ASPIRIN EC 81 MG PO TBEC
81.0000 mg | DELAYED_RELEASE_TABLET | Freq: Every day | ORAL | Status: DC
  Administered 2018-05-18 – 2018-05-22 (×4): 81 mg via ORAL
  Filled 2018-05-17 (×5): qty 1

## 2018-05-17 MED ORDER — NITROGLYCERIN 0.4 MG SL SUBL: 0.4000 mg | SUBLINGUAL_TABLET | SUBLINGUAL | Status: DC | PRN

## 2018-05-17 MED ORDER — ACETAMINOPHEN 325 MG PO TABS
650.0000 mg | ORAL_TABLET | ORAL | Status: DC | PRN
  Administered 2018-05-19 – 2018-05-20 (×2): 650 mg via ORAL
  Filled 2018-05-17: qty 2

## 2018-05-17 MED ORDER — ONDANSETRON HCL 4 MG/2ML IJ SOLN: 4.0000 mg | Freq: Four times a day (QID) | INTRAMUSCULAR | Status: DC | PRN

## 2018-05-17 MED ORDER — SODIUM CHLORIDE 0.9 % WEIGHT BASED INFUSION: 1.0000 mL/kg/h | INTRAVENOUS | Status: DC

## 2018-05-17 MED ORDER — LISINOPRIL 10 MG PO TABS
10.0000 mg | ORAL_TABLET | Freq: Every day | ORAL | Status: DC
  Administered 2018-05-17 – 2018-05-22 (×6): 10 mg via ORAL
  Filled 2018-05-17 (×6): qty 1

## 2018-05-17 MED ORDER — ADULT MULTIVITAMIN W/MINERALS CH
1.0000 | ORAL_TABLET | Freq: Every day | ORAL | Status: DC
  Administered 2018-05-17 – 2018-05-24 (×7): 1 via ORAL
  Filled 2018-05-17 (×7): qty 1

## 2018-05-17 MED ORDER — ISOSORBIDE MONONITRATE ER 30 MG PO TB24
30.0000 mg | ORAL_TABLET | Freq: Every day | ORAL | Status: DC
  Administered 2018-05-17 – 2018-05-22 (×6): 30 mg via ORAL
  Filled 2018-05-17 (×6): qty 1

## 2018-05-17 NOTE — Progress Notes (Signed)
Patient arrived the unit from home, assessment  completed see flow sheet, placed on tele ccmd notified, patient oriented to room and staff, bed in lowest position call bell within reach will continue to monitor.

## 2018-05-17 NOTE — Telephone Encounter (Signed)
Rec'd call from patient placement they were unable to get in touch with patient. I called her and she said she has still been running errands this AM, and had left her phone in the car. Gave her their number and asked her to call them back asap. She said she will do so. Dayna Dunn PA-C

## 2018-05-17 NOTE — H&P (Addendum)
Cardiology History & Physical    Patient ID: [name] MRN: [id number], DOB: [birth year] Date of Encounter: 05/17/2018, 10:56 AM Primary Physician: [name], MD Primary Cardiologist: Chrystie Nose, MD  Chief Complaint: chest pain Reason for Admission: elevated troponin yesterday Requesting MD: Dr. Drue Dun consulted cardiology yesterday - patient wanted to leave AMA and return for direct admission today.  HPI: [name] is a 67 y.o. female with history of aortic insufficiency s/p St. Jude AVR 2000,  subclavian artery stenosis s/p bypass 2001, CAD s/p CABGx2 (SVG-LAD and SVG-Cx) in 2014 with redo replacement of fusiform aneurysm of ascending aorta (complicated by pericardial hematoma s/p evacuation), NSTEMI 2017 with ICM 40-45% and cath resulting in PCI of SVG-LAD, EF normalized in 2018, PACs/PVCs, RA, sarcoidosis, dyslipidemia, contrast allergy (hives) who presented back to [facility] for direct admission. She was seen in the ER yesterday for anginal-type chest pain and troponin 0.11. Admission was recommended but she declined as she had "things to take care of and bills to pay." She promised to return today.  She describes what sounds like stable angina for many years but cannot really say when this started, possibly before she even had PCI in 06/2016 without significant change - usually this is exclusively with "rushing" or lifting heavy items. In 07/2016 she had a nonischemic nuc which was her last ischemic assessment. Last echo 12/2016 showed EF 60-65%, normal diastolic parameters and normal AV function, mild MR, mild LAE.  During her visit 12/2016 her metoprolol was stopped due to possible 2nd degree heart block on EKG and Isosorbide was reduced. I reviewed this EKG with Dr. Rennis Golden though as it's not clear this truly represented 2nd degree heart block as it appears more consistent with NSR with ectopy. She was doing well at last OV 01/2018 without complaints. She indicated to pharmacy tech  yesterday that she was no longer taking isosorbide but today she is not really clear on whether she is or not. She states "I take exactly what Dr. Rennis Golden tells me to take." However, this isn't the first time a med fell off her list as this previously happened to her lisinopril for unclear reasons.   She works at [employer]. Due to a [employer], she worked 12 days in a row. Yesterday she was in [employer] going to carry empty boxes when he developed chest pain similar to prior angina. She rested and when it did not go away she notified a Radio broadcast assistant. She felt sweaty but had been sweating beforehand anyway. No SOB, nausea, diaphoresis. She took 1 SL NTG which relieved the pain, but coworkers alerted EMS and the patient was transported to the hospital. Initial trop negative but f/u value 0.11. Na 147, K 0.99, INR 1.93.  CXR without acute disease.  VSS. Tele showed bradycardia with PACs/PVCs for which she is relatively asymptomatic. Initial EKS EKG did show subtle ST depression inferiorly and V5-V6. Her last dose of NTG otherwise was last week while carrying heavy boxes. As above, she insisted on leaving AMA from the ER despite counseling and promised to return today after she wrapped up loose ends. She was advised to remain NPO after midnight and hold Coumadin in prep for cath.  Upon arrival to [facility], she is pain free but does report she had a little recurrent chest tightness at rest last night. We discussed how she broke her promise to return for recurrent symptoms but she said, "Nah, I was fine!" Last ate 8:30pm, did not take Coumadin  per instructions last night.  Past Medical History:  Diagnosis Date  . Ascending aortic aneurysm (HCC)    a.  redo replacement of fusiform aneurysm of ascending aorta (complicated by pericardial hematoma s/p evacuation) in 2014.  Marland Kitchen Contrast media allergy   . Coronary artery disease    a. s/p CABG x2 in 2014. b. s/p DES to SVG-LAD in 2017.  . H/O mechanical aortic  valve replacement   . Hyperlipidemia   . Hypertension   . Ischemic cardiomyopathy    a. EF 40-45% in 2017 at time of NSTEMI, improved to normal in 2018.  . Non-STEMI (non-ST elevated myocardial infarction) (HCC)   . Premature atrial contractions   . PVC's (premature ventricular contractions)   . RA (rheumatoid arthritis) (HCC)   . S/P AVR (aortic valve replacement) 08/1999   St. Jude AVR for aortic insuff, Goal INR 2.5-3.5  . 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 Donata Clay) - complicated by pericardial hematoma --> subxiphoid pericardial exposure and evacuation b. Cath 06/2016: DES to SVG-LAD ostium  . Sarcoid   . Second degree AV block    a. ? possibly seen in 12/2016 clinic visit - EKG not truly suggestive  . Severe anemia   . 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 s/p left subclavian bypass.     Surgical History:  Past Surgical History:  Procedure Laterality Date  . AORTIC VALVE REPLACEMENT  08/23/1999   78mm St. Jude, Bentall procedure, root replacemtn   . CARDIAC CATHETERIZATION  08/1999   normal L main, LAD & first diagonal are normal, normal Cfx, RCA with mild ostial narrowing, normal LV systolic function, severe AI, mild MR, normal PAP (Dr. Mervyn Skeeters. Little)   . CARDIAC CATHETERIZATION N/A 06/22/2016   Procedure: Left Heart Cath and Cors/Grafts Angiography;  Surgeon: Marykay Lex, MD;  Location: [facility] INVASIVE CV LAB;  Service: Cardiovascular;  Laterality: N/A;  . CARDIAC CATHETERIZATION N/A 06/23/2016   Procedure: Left Heart Cath and Coronary Angiography;  Surgeon: Tonny Bollman, MD;  Location: [facility] INVASIVE CV LAB;  Service: Cardiovascular;  Laterality: N/A;  . CARDIAC CATHETERIZATION N/A 06/23/2016   Procedure: Coronary Stent Intervention;  Surgeon: Tonny Bollman, MD;  Location: [facility] INVASIVE CV LAB;  Service: Cardiovascular;  Laterality: N/A;  . CORONARY ARTERY BYPASS GRAFT  11/05/2012    Procedure: CORONARY ARTERY BYPASS GRAFTING (CABG);  Surgeon: Kerin Perna, MD;  Location: [facility] OR;  Service: Open Heart Surgery;  Laterality: N/A;  cannulate right subclavian  . ENDOVEIN HARVEST OF GREATER SAPHENOUS VEIN  11/05/2012   Procedure: ENDOVEIN HARVEST OF GREATER SAPHENOUS VEIN;  Surgeon: Kerin Perna, MD;  Location: [facility] OR;  Service: Open Heart Surgery;  Laterality: Right;  . INTRAOPERATIVE TRANSESOPHAGEAL ECHOCARDIOGRAM  11/05/2012   Procedure: INTRAOPERATIVE TRANSESOPHAGEAL ECHOCARDIOGRAM;  Surgeon: Kerin Perna, MD;  Location: [facility] OR;  Service: Open Heart Surgery;  Laterality: N/A;  . INTRAOPERATIVE TRANSESOPHAGEAL ECHOCARDIOGRAM N/A 11/21/2012   Procedure: INTRAOPERATIVE TRANSESOPHAGEAL ECHOCARDIOGRAM;  Surgeon: Kerin Perna, MD;  Location: [facility] OR;  Service: Open Heart Surgery;  Laterality: N/A;  . LEFT HEART CATHETERIZATION WITH CORONARY ANGIOGRAM N/A 11/04/2012   Procedure: LEFT HEART CATHETERIZATION WITH CORONARY ANGIOGRAM;  Surgeon: Chrystie Nose, MD;  Location: [facility] CATH LAB;  Service: Cardiovascular;  Laterality: N/A;  . MEDIASTINOSCOPY  08/2005   bronchoscopy & mediastinoscopy for mediastinal adenopathy (Dr. Donata Clay)   . [facility]  PERF WALL MOTION  01/2012   lexiscan; small, fixed apical septal breast attenuation artifact & inferolateral bowel attenuation, no reversible ischemia, post-stress EF 64%, low risk scan   . PERICARDIAL WINDOW N/A 11/21/2012   Procedure: PERICARDIAL WINDOW;  Surgeon: Kerin Perna, MD;  Location: [facility] OR;  Service: Thoracic;  Laterality: N/A;  . REPLACEMENT ASCENDING AORTA  11/05/2012   Procedure: REPLACEMENT ASCENDING AORTA;  Surgeon: Kerin Perna, MD;  Location: [facility] OR;  Service: Open Heart Surgery;  Laterality: N/A;  . SUBCLAVIAN ANGIOGRAM  05/10/2000   left subclavian arteriogram; patent graft, mild stenosis (Dr. Amada Kingfisher)   . SUBCLAVIAN BYPASS GRAFT  05/16/1999   left subclavian bypass with 72mm Darcon graft (Dr. Amada Kingfisher)  . TRANSTHORACIC  ECHOCARDIOGRAM  11/20/2012   EF 65-70%, mild conc LVH, grade 2 diastolic dysfunction, mech AV, mild MR     Home Meds: Prior to Admission medications   Medication Sig Start Date End Date Taking? Authorizing Provider  aspirin EC 81 MG EC tablet Take 1 tablet (81 mg total) by mouth daily. Patient not taking: Reported on 05/16/2018 06/26/16   Ellsworth Lennox, PA-C  atorvastatin (LIPITOR) 80 MG tablet TAKE 1 TABLET (80 MG TOTAL) BY MOUTH DAILY AT 6 PM. 12/18/17   Hilty, Lisette Abu, MD  Calcium Carb-Cholecalciferol (CALCIUM 600+D3 PO) Take 2,000 Units by mouth.    [provider]  clopidogrel (PLAVIX) 75 MG tablet TAKE 1 TABLET BY MOUTH DAILY WITH BREAKFAST Patient taking differently: Take 75 mg by mouth daily.  02/14/18   Hilty, Lisette Abu, MD  diazepam (VALIUM) 2 MG tablet Take 1 tablet (2 mg total) by mouth every 8 (eight) hours as needed for muscle spasms. Patient not taking: Reported on 05/16/2018 12/29/16   Everlene Farrier, PA-C  Diclofenac Sodium 3 % GEL Place 1 application onto the skin 2 (two) times daily. To affected area. 12/29/16   Everlene Farrier, PA-C  fluticasone (FLONASE) 50 MCG/ACT nasal spray Place 1 spray into both nostrils daily. Patient not taking: Reported on 05/16/2018 11/20/17   Graciella Freer A, PA-C  isosorbide mononitrate (IMDUR) 30 MG 24 hr tablet TAKE 1 TABLET BY MOUTH EVERY DAY Patient not taking: Reported on 05/16/2018 11/15/17   Barrett, Joline Salt, PA-C  lisinopril (PRINIVIL,ZESTRIL) 10 MG tablet Take 1 tablet (10 mg total) by mouth daily. 08/03/17 05/16/18  Chrystie Nose, MD  Multiple Vitamin (MULTIVITAMIN WITH MINERALS) TABS tablet Take 1 tablet by mouth daily.    [provider]  nitroGLYCERIN (NITROSTAT) 0.4 MG SL tablet PLACE 1 TABLET (0.4 MG TOTAL) UNDER THE TONGUE EVERY 5 (FIVE) MINUTES AS NEEDED FOR CHEST PAIN. 02/13/18   Hilty, Lisette Abu, MD  ondansetron (ZOFRAN) 4 MG tablet Take 1 tablet (4 mg total) by mouth every 6 (six) hours. Patient not taking:  Reported on 05/16/2018 11/20/17   Maxwell Caul, PA-C  oseltamivir (TAMIFLU) 75 MG capsule Take 1 capsule (75 mg total) by mouth every 12 (twelve) hours. Patient not taking: Reported on 02/22/2018 11/20/17   Graciella Freer A, PA-C  pantoprazole (PROTONIX) 40 MG tablet TAKE 1 TABLET BY MOUTH EVERY DAY 04/29/18   Hilty, Lisette Abu, MD  polyvinyl alcohol (ARTIFICIAL TEARS) 1.4 % ophthalmic solution Place 1-2 drops into both eyes daily as needed for dry eyes.    [provider]  warfarin (COUMADIN) 5 MG tablet TAKE 1/2 TO 1 TABLET BY MOUTH DAILY OR AS DIRECTED BY COUMADIN CLINIC Patient taking differently: TAKE 1/2 TO 1 TABLET BY MOUTH  DAILY OR AS DIRECTED BY COUMADIN CLINIC 03/18/18   Hilty, Lisette Abu, MD    Allergies:  Allergies  Allergen Reactions  . Contrast Media [Iodinated Diagnostic Agents] Hives, Itching and Swelling    Social History   Socioeconomic History  . Marital status: Single    Spouse name: Not on file  . Number of children: 2  . Years of education: 41  . Highest education level: Not on file  Occupational History  . Occupation: Distribution center    Employer: [employer]  Social Needs  . Financial resource strain: Not on file  . Food insecurity:    Worry: Not on file    Inability: Not on file  . Transportation needs:    Medical: Not on file    Non-medical: Not on file  Tobacco Use  . Smoking status: Never Smoker  . Smokeless tobacco: Never Used  Substance and Sexual Activity  . Alcohol use: No  . Drug use: No  . Sexual activity: Not on file  Lifestyle  . Physical activity:    Days per week: Not on file    Minutes per session: Not on file  . Stress: Not on file  Relationships  . Social connections:    Talks on phone: Not on file    Gets together: Not on file    Attends religious service: Not on file    Active member of club or organization: Not on file    Attends meetings of clubs or organizations: Not on file    Relationship status: Not on  file  . Intimate partner violence:    Fear of current or ex partner: Not on file    Emotionally abused: Not on file    Physically abused: Not on file    Forced sexual activity: Not on file  Other Topics Concern  . Not on file  Social History Narrative  . Not on file     Family History  Problem Relation Age of Onset  . Hypertension Mother   . Colon cancer Neg Hx   . Esophageal cancer Neg Hx   . Pancreatic cancer Neg Hx   . Stomach cancer Neg Hx   . Liver disease Neg Hx     Review of Systems: All other systems reviewed and are otherwise negative except as noted above.  Labs:   Lab Results  Component Value Date   WBC 4.5 05/16/2018   HGB 13.2 05/16/2018   HCT 42.3 05/16/2018   MCV 83.8 05/16/2018   PLT 173 05/16/2018    Recent Labs  Lab 05/16/18 0907  NA 147*  K 3.5  CL 117*  CO2 25  BUN 12  CREATININE 0.99  CALCIUM 9.3  GLUCOSE 89   Recent Labs    05/16/18 1230  TROPONINI 0.11*   Lab Results  Component Value Date   CHOL 134 02/22/2018   HDL 84 02/22/2018   LDLCALC 42 02/22/2018   TRIG 40 02/22/2018   No results found for: DDIMER  Radiology/Studies:  Dg Chest 2 View  Result Date: 05/16/2018 CLINICAL DATA:  Chest pain. EXAM: CHEST - 2 VIEW COMPARISON:  Radiographs of November 19, 2017. FINDINGS: Stable cardiomediastinal silhouette. Status post coronary artery bypass graft. Atherosclerosis of thoracic aorta is noted. No pneumothorax or pleural effusion is noted. Both lungs are clear. The visualized skeletal structures are unremarkable. IMPRESSION: No active cardiopulmonary disease. Aortic Atherosclerosis (ICD10-I70.0). Electronically Signed   By: Lupita Raider, M.D.   On: 05/16/2018 09:31  Wt Readings from Last 3 Encounters:  02/22/18 72 kg  11/19/17 71.2 kg  08/03/17 74.8 kg    EKG: sinus bradycardia with occasional PACs/PVCs. Previous ST depressions have improved  Physical Exam: Blood pressure (!) 156/68, pulse 55, temperature 97.8 F (36.6  C), temperature source Oral, resp. rate 18, SpO2 98 %. There is no height or weight on file to calculate BMI. General: Well developed, well nourished AAF, in no acute distress. Head: Normocephalic, atraumatic, sclera non-icteric, no xanthomas, nares are without discharge.  Neck: Negative for carotid bruits. JVD not elevated. Lungs: Clear bilaterally to auscultation without wheezes, rales, or rhonchi. Breathing is unlabored. Heart: RRR with S1 S2. 2/6 SEM heard best at LSB with crisp valve click. No rubs or gallops appreciated. Abdomen: Soft, non-tender, non-distended with normoactive bowel sounds. No hepatomegaly. No rebound/guarding. No obvious abdominal masses. Msk:  Strength and tone appear normal for age. Extremities: No clubbing or cyanosis. No edema.  Distal pedal pulses are 2+ and equal bilaterally. Neuro: Alert and oriented X 3. No focal deficit. No facial asymmetry. Moves all extremities spontaneously. Psych:  Responds to questions appropriately with a normal affect.    Assessment and Plan   1. Angina pectoris/possible NSTEMI with known history of CAD - concerning for ischemic chest pain. Troponin 0.11 yesterday. Check labs stat. Of note, she had relatively low troponins when seen for NSTEMI in 2017 as well. It concerns me that she had some tightness at rest last night. I would favor cardiac cath when INR is acceptable. Check stat labs to guide further decisions on timing and heparin. Will resume Imdur at previous home dose. Rec'd 325mg  ASA yesterday. She is on Plavix/Coumadin. Will add baby ASA for now, pending assessment of coronaries. She DOES have a contrast allergy and will require pre-med once timing of cath determined. Avoid BB given baseline bradycardia (HR 49-55 in ER yesterday). Addendum: INR is 1.92. Start heparin per pharmacy. I've put her on the cath board tentatively for Monday, first case, and will write pre-cath orders to include her premedication for dye allergy. These are  under sign/held and should be released by the weekend team or nurse on Sunday. OK to eat.   2. Aortic insufficiency with mechanical AVR 2000 on Coumadin with subtherapeutic INR yesterday - plan to manage anticoag in the context of the above. Not clear why goal INR listed 2.5-3.5 as OP, this can be clarified by MD.  3. Ascending aortic aneurysm s/p repair 2014 with AAA as well - AAA scan 06/2016 showed stable dimensions of the supraceliac AAA of the proximal aorta measuring 3.2 cm x 3.6 cm with recommended f/u 06/2017 which does not appear to have occurred. Last CTA 12/2014 showed stable changes of Bentall procedure, stable 4 cm aneurysmal dilatation of the distal ascending aorta and arch. overdue for reassessment but no acute sx to suspect acute dissection.  4. Subclavian bypass history -  carotid duplex/subclavians 06/2016 were felt stable with repeat suggested 06/2018.  5. PACs/PVCs - noted in the past as well, asymptomatic.  6. Hypernatremia - Na 147 yesterday, possibly related to fluid losses. Patient reports a lot of sweating at work yesterday. Recheck today.  7. HTN - BP was controlled yesterday upon arrival to ER post NTG, but has been elevated since that time. She had not gotten AM meds yesterday and has not taken them today. Follow with resumption.  8. Hyperlipidemia - continue statin.   Severity of Illness: The appropriate patient status for this patient is INPATIENT.  Inpatient status is judged to be reasonable and necessary in order to provide the required intensity of service to ensure the patient's safety. The patient's presenting symptoms, physical exam findings, and initial radiographic and laboratory data in the context of their chronic comorbidities is felt to place them at high risk for further clinical deterioration. Furthermore, it is not anticipated that the patient will be medically stable for discharge from the hospital within 2 midnights of admission. The following factors  support the patient status of inpatient.   " The patient's presenting symptoms include chest pain. " The worrisome physical exam findings include sternal scar, valve click. " The initial radiographic and laboratory data are worrisome because of elevated troponin. " The chronic co-morbidities include CAD, AVR, vascular disease.   * I certify that at the point of admission it is my clinical judgment that the patient will require inpatient hospital care spanning beyond 2 midnights from the point of admission due to high intensity of service, high risk for further deterioration and high frequency of surveillance required.*    For questions or updates, please contact [facility] Please consult [email or web address] for contact info under Cardiology/STEMI.  Signed, Laurann Montana, PA-C 05/17/2018, 10:56 AM

## 2018-05-17 NOTE — Progress Notes (Signed)
ANTICOAGULATION CONSULT NOTE  Pharmacy Consult for heparin Indication: mechanical AVR  Allergies  Allergen Reactions  . Contrast Media [Iodinated Diagnostic Agents] Hives, Itching and Swelling    Patient Measurements: Height: 5\' 6"  (167.6 cm) Weight: 160 lb 8 oz (72.8 kg) IBW/kg (Calculated) : 59.3 Heparin Dosing Weight: 72.8 kg  Vital Signs: Temp: 98.1 F (36.7 C) (08/16 1831) Temp Source: Oral (08/16 1831) BP: 124/61 (08/16 1831) Pulse Rate: 56 (08/16 1831)  Labs: Recent Labs    05/16/18 0907 05/16/18 1230 05/17/18 1114 05/17/18 1855  HGB 13.2  --  12.3  --   HCT 42.3  --  38.5  --   PLT 173  --  184  --   LABPROT 21.9*  --  21.8*  --   INR 1.93  --  1.92  --   HEPARINUNFRC  --   --   --  0.34  CREATININE 0.99  --  0.91  --   TROPONINI  --  0.11* <0.03  --     Estimated Creatinine Clearance: 63 mL/min (by C-G formula based on SCr of 0.91 mg/dL).   Medications:  . sodium chloride    . sodium chloride    . [START ON 05/20/2018] sodium chloride     Followed by  . [START ON 05/20/2018] sodium chloride    . heparin 1,000 Units/hr (05/17/18 1328)    Assessment: 66 yof presenting with ischemic chest pain. On warfarin PTA on mechanical AVR. Goal per outpatient anticoagulation clinic is 2.5-3.5. PTA regimen listed as 5 mg daily except 2.5 mg on Mon/Thurs- pt reporting 2.5 mg on Tues/Thurs. Also on concurrent plavix.   Heparin level is therapeutic at 0.34. No bleeding noted.  Goal of Therapy:  Heparin level 0.3-0.7 units/ml Monitor platelets by anticoagulation protocol: Yes   Plan:  Hold warfarin therapy Continue heparin drip at 1000 units/hr Confirmatory heparin level at 01:00 Monitor for s/sx of bleeding   05/19/18, PharmD, BCPS Clinical Pharmacist Clinical phone for 05/17/2018 until 10p is [phone] Please check AMION for all Pharmacist numbers by unit 05/17/2018 8:45 PM

## 2018-05-17 NOTE — Progress Notes (Addendum)
ANTICOAGULATION CONSULT NOTE - Initial Consult  Pharmacy Consult for heparin infusion Indication: mechanical AVR  Allergies  Allergen Reactions  . Contrast Media [Iodinated Diagnostic Agents] Hives, Itching and Swelling    Patient Measurements: Height: 5\' 6"  (167.6 cm) Weight: 160 lb 8 oz (72.8 kg) IBW/kg (Calculated) : 59.3 Heparin Dosing Weight: 72.8 kg  Vital Signs: Temp: 97.8 F (36.6 C) (08/16 1046) Temp Source: Oral (08/16 1046) BP: 156/68 (08/16 1046) Pulse Rate: 64 (08/16 1057)  Labs: Recent Labs    05/16/18 0907 05/16/18 1230  HGB 13.2  --   HCT 42.3  --   PLT 173  --   LABPROT 21.9*  --   INR 1.93  --   CREATININE 0.99  --   TROPONINI  --  0.11*    Estimated Creatinine Clearance: 57.9 mL/min (by C-G formula based on SCr of 0.99 mg/dL).   Medical History: Past Medical History:  Diagnosis Date  . Ascending aortic aneurysm (HCC)    a.  redo replacement of fusiform aneurysm of ascending aorta (complicated by pericardial hematoma s/p evacuation) in 2014.  2015 Contrast media allergy   . Coronary artery disease    a. s/p CABG x2 in 2014. b. s/p DES to SVG-LAD in 2017.  . H/O mechanical aortic valve replacement   . Hyperlipidemia   . Hypertension   . Ischemic cardiomyopathy    a. EF 40-45% in 2017 at time of NSTEMI, improved to normal in 2018.  . Non-STEMI (non-ST elevated myocardial infarction) (HCC)   . Premature atrial contractions   . PVC's (premature ventricular contractions)   . RA (rheumatoid arthritis) (HCC)   . S/P AVR (aortic valve replacement) 08/1999   St. Jude AVR for aortic insuff, Goal INR 2.5-3.5  . 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 11/2012) - complicated by pericardial hematoma --> subxiphoid pericardial exposure and evacuation b. Cath 06/2016: DES to SVG-LAD ostium  . Sarcoid   . Second degree AV block    a. ? possibly seen in 12/2016 clinic visit -  EKG not truly suggestive  . Severe anemia   . 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 s/p left subclavian bypass.    Medications:  Scheduled:  . [START ON 05/18/2018] aspirin EC  81 mg Oral Daily  . atorvastatin  80 mg Oral q1800  . clopidogrel  75 mg Oral Daily  . isosorbide mononitrate  30 mg Oral Daily  . lisinopril  10 mg Oral Daily  . multivitamin with minerals  1 tablet Oral Daily  . pantoprazole  40 mg Oral Daily  . sodium chloride flush  3 mL Intravenous Q12H    Assessment: 66 yof presenting with ischemic chest pain. On warfarin PTA on mechanical AVR. Goal per outpatient anticoagulation clinic is 2.5-3.5. PTA regimen listed as 5 mg daily except 2.5 mg on Mon/Thurs- pt reporting 2.5 mg on Tues/Thurs. Also on concurrent plavix.   INR yesterday 1.93. CBC WNL. INR today is 1.92. Hgb 12.3, plt 184. Plan for cath once INR</= 1.6 and okay to start heparin when INR<2. No s/sx of bleeding reported.  Goal of Therapy:  INR 2.5-3.5 - clarifying goal Heparin level 0.3-0.7 units/ml Monitor platelets by anticoagulation protocol: Yes   Plan:  Hold warfarin therapy Start heparin infusion at 1000 units/hr Check anti-Xa level in 6 hours and daily while on heparin Continue to monitor H&H and platelets  Girard Cooter, PharmD Clinical Pharmacist  Pager: [phone] Phone: [phone] 05/17/2018,11:41 AM

## 2018-05-18 LAB — BASIC METABOLIC PANEL
Anion gap: 7 (ref 5–15)
BUN: 14 mg/dL (ref 8–23)
CALCIUM: 9.2 mg/dL (ref 8.9–10.3)
CO2: 27 mmol/L (ref 22–32)
CREATININE: 0.88 mg/dL (ref 0.44–1.00)
Chloride: 112 mmol/L — ABNORMAL HIGH (ref 98–111)
GFR calc Af Amer: >60 mL/min (ref >60)
GLUCOSE: 120 mg/dL — AB (ref 70–99)
Potassium: 3.5 mmol/L (ref 3.5–5.1)
Sodium: 146 mmol/L — ABNORMAL HIGH (ref 135–145)

## 2018-05-18 LAB — CBC
HCT: 38.9 % (ref 36.0–46.0)
Hemoglobin: 12.3 g/dL (ref 12.0–15.0)
MCH: 25.9 pg — AB (ref 26.0–34.0)
MCHC: 31.6 g/dL (ref 30.0–36.0)
MCV: 81.9 fL (ref 78.0–100.0)
Platelets: 163 10*3/uL (ref 150–400)
RBC: 4.75 MIL/uL (ref 3.87–5.11)
RDW: 15.9 % — ABNORMAL HIGH (ref 11.5–15.5)
WBC: 4.7 10*3/uL (ref 4.0–10.5)

## 2018-05-18 LAB — LIPID PANEL
Cholesterol: 113 mg/dL (ref 0–200)
HDL: 70 mg/dL (ref >40)
LDL Cholesterol: 37 mg/dL (ref 0–99)
Total CHOL/HDL Ratio: 1.6 RATIO
Triglycerides: 29 mg/dL (ref <150)
VLDL: 6 mg/dL (ref 0–40)

## 2018-05-18 LAB — HEPARIN LEVEL (UNFRACTIONATED)
HEPARIN UNFRACTIONATED: 0.76 [IU]/mL — AB (ref 0.30–0.70)
HEPARIN UNFRACTIONATED: 0.62 [IU]/mL (ref 0.30–0.70)

## 2018-05-18 LAB — PROTIME-INR
INR: 1.76
PROTHROMBIN TIME: 20.4 s — AB (ref 11.4–15.2)

## 2018-05-18 LAB — HIV ANTIBODY (ROUTINE TESTING W REFLEX): HIV Screen 4th Generation wRfx: NONREACTIVE

## 2018-05-18 NOTE — Progress Notes (Signed)
ANTICOAGULATION CONSULT NOTE  Pharmacy Consult for heparin Indication: mechanical AVR  Allergies  Allergen Reactions  . Contrast Media [Iodinated Diagnostic Agents] Hives, Itching and Swelling    Patient Measurements: Height: 5\' 6"  (167.6 cm) Weight: 161 lb 11.2 oz (73.3 kg) IBW/kg (Calculated) : 59.3 Heparin Dosing Weight: 72.8 kg  Vital Signs: Temp: 97.9 F (36.6 C) (08/16 2150) Temp Source: Oral (08/16 2150) BP: 143/68 (08/16 2150) Pulse Rate: 56 (08/16 2150)  Labs: Recent Labs    05/16/18 0907 05/16/18 1230 05/17/18 1114 05/17/18 1855 05/18/18 0332  HGB 13.2  --  12.3  --  12.3  HCT 42.3  --  38.5  --  38.9  PLT 173  --  184  --  163  LABPROT 21.9*  --  21.8*  --  20.4*  INR 1.93  --  1.92  --  1.76  HEPARINUNFRC  --   --   --  0.34 0.76*  CREATININE 0.99  --  0.91  --  0.88  TROPONINI  --  0.11* <0.03  --   --     Estimated Creatinine Clearance: Mariah.3 mL/min (by C-G formula based on SCr of 0.88 mg/dL).   Medications:  . sodium chloride    . sodium chloride    . [START ON 05/20/2018] sodium chloride     Followed by  . [START ON 05/20/2018] sodium chloride    . heparin 1,000 Units/hr (05/17/18 1328)    Assessment: [name] presenting with ischemic chest pain. On warfarin PTA on mechanical AVR. Goal per outpatient anticoagulation clinic is 2.5-3.5. PTA regimen listed as 5 mg daily except 2.5 mg on Mon/Thurs- pt reporting 2.5 mg on Tues/Thurs. Also on concurrent plavix.  Pharmacy consulted to manage heparin   8/17: Heparin level is supratherapeutic this morning at 0.76. No s/sx of bleeding noted, CBC stable.  Goal of Therapy:  Heparin level 0.3-0.7 units/ml Monitor platelets by anticoagulation protocol: Yes   Plan:  Decrease heparin infusion to 900 units/hr Will check another heparin level in ~6 hours Monitor daily heparin level, CBC, s/sx of bleeding  Thank you for involving pharmacy in this patient's care.  9/17, PharmD PGY1 Pharmacy  Resident Phone: [phone] 05/18/2018 7:25 AM

## 2018-05-18 NOTE — Plan of Care (Signed)
  Problem: Clinical Measurements: Goal: Will remain free from infection Outcome: Progressing Goal: Respiratory complications will improve Outcome: Progressing   Problem: Nutrition: Goal: Adequate nutrition will be maintained Outcome: Progressing   Problem: Safety: Goal: Ability to remain free from injury will improve Outcome: Progressing   Problem: Health Behavior/Discharge Planning: Goal: Ability to manage health-related needs will improve Outcome: Not Progressing   Problem: Skin Integrity: Goal: Risk for impaired skin integrity will decrease Outcome: Completed/Met

## 2018-05-18 NOTE — Progress Notes (Signed)
ANTICOAGULATION CONSULT NOTE  Pharmacy Consult for heparin Indication: mechanical AVR  Allergies  Allergen Reactions  . Contrast Media [Iodinated Diagnostic Agents] Hives, Itching and Swelling    Patient Measurements: Height: 5\' 6"  (167.6 cm) Weight: 161 lb 11.2 oz (73.3 kg) IBW/kg (Calculated) : 59.3 Heparin Dosing Weight: 72.8 kg  Vital Signs: Temp: 97.9 F (36.6 C) (08/17 1155) Temp Source: Oral (08/17 1155) BP: 141/52 (08/17 1155)  Labs: Recent Labs    05/16/18 0907 05/16/18 1230 05/17/18 1114 05/17/18 1855 05/18/18 0332 05/18/18 1418  HGB 13.2  --  12.3  --  12.3  --   HCT 42.3  --  38.5  --  38.9  --   PLT 173  --  184  --  163  --   LABPROT 21.9*  --  21.8*  --  20.4*  --   INR 1.93  --  1.92  --  1.76  --   HEPARINUNFRC  --   --   --  0.34 0.76* 0.62  CREATININE 0.99  --  0.91  --  0.88  --   TROPONINI  --  0.11* <0.03  --   --   --     Estimated Creatinine Clearance: 65.3 mL/min (by C-G formula based on SCr of 0.88 mg/dL).   Medications:  . sodium chloride    . sodium chloride    . [START ON 05/20/2018] sodium chloride     Followed by  . [START ON 05/20/2018] sodium chloride    . heparin 900 Units/hr (05/18/18 1109)    Assessment: 66 yof presenting with ischemic chest pain. On warfarin PTA on mechanical AVR. Goal per outpatient anticoagulation clinic is 2.5-3.5. PTA regimen listed as 5 mg daily except 2.5 mg on Mon/Thurs- pt reporting 2.5 mg on Tues/Thurs. Also on concurrent plavix.  Pharmacy consulted to manage heparin   Heparin level came back therapeutic at 0.62, on 900 units/hr. Hgb stable at 12.3, plt 163. No s/sx of bleeding. No infusion issues.   Goal of Therapy:  Heparin level 0.3-0.7 units/ml Monitor platelets by anticoagulation protocol: Yes   Plan:  Continue holding warfarin Continue heparin infusion to 900 units/hr Monitor daily heparin level, CBC, s/sx of bleeding  Thank you for involving pharmacy in this patient's  care.  05/20/18, PharmD Clinical Pharmacist  Pager: [phone] Phone: [phone] 05/18/2018 3:11 PM

## 2018-05-18 NOTE — Progress Notes (Signed)
Progress Note  Patient Name: [name] Date of Encounter: 05/18/2018  Primary Cardiologist: [facility]  Primary Electrophysiologist: na   Patient Profile     67 y.o. female  with history of aortic insufficiency s/p St. 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   Subjective   Without chest pain or shortness of breath   Just tired   Inpatient Medications    Scheduled Meds: . [START ON 05/20/2018] aspirin  81 mg Oral Pre-Cath  . aspirin EC  81 mg Oral Daily  . atorvastatin  80 mg Oral q1800  . clopidogrel  75 mg Oral Daily  . [START ON 05/20/2018] diphenhydrAMINE  50 mg Oral Once   Or  . [START ON 05/20/2018] diphenhydrAMINE  50 mg Intravenous Once  . isosorbide mononitrate  30 mg Oral Daily  . lisinopril  10 mg Oral Daily  . multivitamin with minerals  1 tablet Oral Daily  . pantoprazole  40 mg Oral Daily  . [START ON 05/19/2018] predniSONE  50 mg Oral Q6H  . sodium chloride flush  3 mL Intravenous Q12H  . sodium chloride flush  3 mL Intravenous Q12H   Continuous Infusions: . sodium chloride    . sodium chloride    . [START ON 05/20/2018] sodium chloride     Followed by  . [START ON 05/20/2018] sodium chloride    . heparin 900 Units/hr (05/18/18 1109)   PRN Meds: sodium chloride, sodium chloride, acetaminophen, nitroGLYCERIN, ondansetron (ZOFRAN) IV, polyvinyl alcohol, sodium chloride flush, sodium chloride flush   Vital Signs    Vitals:   05/17/18 1831 05/17/18 2150 05/18/18 0407 05/18/18 0900  BP: 124/61 (!) 143/68  112/87  Pulse: (!) 56 (!) 56    Resp: 19 20  (!) 24  Temp: 98.1  F (36.7 C) 97.9 F (36.6 C)  97.8 F (36.6 C)  TempSrc: Oral Oral  Oral  SpO2: 100% 100%    Weight:   73.3 kg   Height:        Intake/Output Summary (Last 24 hours) at 05/18/2018 1133 Last data filed at 05/18/2018 4709 Gross per 24 hour  Intake 648.45 ml  Output -  Net 648.45 ml   Filed Weights   05/17/18 1057 05/18/18 0407  Weight: 72.8 kg 73.3 kg    Telemetry    Sinus with PACs and PVCs - Personally Reviewed    Physical Exam  Well developed and nourished in no acute distress HENT normal Neck supple with JVP-flat Clear Irregular rate and rhythm, mechanical S2 early systolic m Abd-soft with active BS No Clubbing cyanosis edema Skin-warm and dry A & Oriented  Grossly normal sensory and motor function   Labs    Chemistry Recent Labs  Lab 05/16/18 0907 05/17/18 1114 05/18/18 0332  NA 147* 144 146*  K 3.5 3.5 3.5  CL 117* 113* 112*  CO2 25 23 27   GLUCOSE 89 79 120*  BUN 12 10 14   CREATININE 0.99 0.91 0.88  CALCIUM 9.3 9.2 9.2  PROT  --  6.0*  --   ALBUMIN  --  3.5  --   AST  --  29  --  ALT  --  31  --   ALKPHOS  --  67  --   BILITOT  --  1.5*  --   GFRNONAA 59* >60 >60  GFRAA >60 >60 >60  ANIONGAP 5 8 7      Hematology Recent Labs  Lab 05/16/18 0907 05/17/18 1114 05/18/18 0332  WBC 4.5 4.4 4.7  RBC 5.05 4.79 4.75  HGB 13.2 12.3 12.3  HCT 42.3 38.5 38.9  MCV 83.8 80.4 81.9  MCH 26.1 25.7* 25.9*  MCHC 31.2 31.9 31.6  RDW 16.4* 15.9* 15.9*  PLT 173 184 163    Cardiac Enzymes Recent Labs  Lab 05/16/18 1230 05/17/18 1114  TROPONINI 0.11* <0.03    Recent Labs  Lab 05/16/18 0911  TROPIPOC 0.01     BNPNo results for input(s): BNP, PROBNP in the last 168 hours.   DDimer No results for input(s): DDIMER in the last 168 hours.   Radiology    No results found.   *   Assessment & Plan    ACS with + Troponin  CAD with prior CABG, post CABG NSTEMI and stenting  Thoracic aortic aneurysm repair; AAA  AVR mechanical  On  coumadin  DYE allergy >> HIVES  HTN    Admitted of cath with need for heparin bridge and dye prophylaxis  Orders written     Signed, 05/18/18, MD  05/18/2018, 11:33 AM

## 2018-05-19 LAB — CBC
HCT: 37.3 % (ref 36.0–46.0)
HCT: 40.2 % (ref 36.0–46.0)
HEMOGLOBIN: 12.6 g/dL (ref 12.0–15.0)
Hemoglobin: 11.7 g/dL — ABNORMAL LOW (ref 12.0–15.0)
MCH: 25.7 pg — AB (ref 26.0–34.0)
MCH: 25.7 pg — ABNORMAL LOW (ref 26.0–34.0)
MCHC: 31.4 g/dL (ref 30.0–36.0)
MCHC: 31.3 g/dL (ref 30.0–36.0)
MCV: 82 fL (ref 78.0–100.0)
MCV: 82 fL (ref 78.0–100.0)
PLATELETS: 184 10*3/uL (ref 150–400)
Platelets: 178 10*3/uL (ref 150–400)
RBC: 4.55 MIL/uL (ref 3.87–5.11)
RBC: 4.9 MIL/uL (ref 3.87–5.11)
RDW: 16.2 % — AB (ref 11.5–15.5)
RDW: 16 % — ABNORMAL HIGH (ref 11.5–15.5)
WBC: 5.2 10*3/uL (ref 4.0–10.5)
WBC: 4.9 10*3/uL (ref 4.0–10.5)

## 2018-05-19 LAB — PROTIME-INR
INR: 1.43
INR: 1.17
PROTHROMBIN TIME: 14.8 s (ref 11.4–15.2)
Prothrombin Time: 17.3 seconds — ABNORMAL HIGH (ref 11.4–15.2)

## 2018-05-19 LAB — HEPARIN LEVEL (UNFRACTIONATED)
HEPARIN UNFRACTIONATED: 0.76 [IU]/mL — AB (ref 0.30–0.70)
HEPARIN UNFRACTIONATED: 0.62 [IU]/mL (ref 0.30–0.70)
HEPARIN UNFRACTIONATED: 0.59 [IU]/mL (ref 0.30–0.70)

## 2018-05-19 MED ORDER — ZOLPIDEM TARTRATE 5 MG PO TABS
5.0000 mg | ORAL_TABLET | Freq: Every evening | ORAL | Status: DC | PRN
  Administered 2018-05-19 – 2018-05-20 (×2): 5 mg via ORAL
  Filled 2018-05-19 (×2): qty 1

## 2018-05-19 NOTE — Progress Notes (Addendum)
Progress Note  Patient Name: [name] Date of Encounter: 05/19/2018  Primary Cardiologist: [facility]  Primary Electrophysiologist: na   Patient Profile     67 y.o. female  with history of aortic insufficiency s/p St. 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   Subjective   No chest pain  Didn't sleep   Inpatient Medications    Scheduled Meds: . [START ON 05/20/2018] aspirin  81 mg Oral Pre-Cath  . aspirin EC  81 mg Oral Daily  . atorvastatin  80 mg Oral q1800  . clopidogrel  75 mg Oral Daily  . [START ON 05/20/2018] diphenhydrAMINE  50 mg Oral Once   Or  . [START ON 05/20/2018] diphenhydrAMINE  50 mg Intravenous Once  . isosorbide mononitrate  30 mg Oral Daily  . lisinopril  10 mg Oral Daily  . multivitamin with minerals  1 tablet Oral Daily  . pantoprazole  40 mg Oral Daily  . predniSONE  50 mg Oral Q6H  . sodium chloride flush  3 mL Intravenous Q12H  . sodium chloride flush  3 mL Intravenous Q12H   Continuous Infusions: . sodium chloride    . sodium chloride    . [START ON 05/20/2018] sodium chloride     Followed by  . [START ON 05/20/2018] sodium chloride    . heparin 800 Units/hr (05/19/18 0841)   PRN Meds: sodium chloride, sodium chloride, acetaminophen, nitroGLYCERIN, ondansetron (ZOFRAN) IV, polyvinyl alcohol, sodium chloride flush, sodium chloride flush   Vital Signs    Vitals:   05/18/18 0900 05/18/18 1155 05/18/18 2029 05/19/18 0556  BP: 112/87 (!) 141/52 (!) 121/50 (!) 122/56  Pulse:   61 (!) 38  Resp: (!) 24 20 15 20   Temp: 97.8 F (36.6 C) 97.9 F (36.6 C) 97.8  F (36.6 C) 97.8 F (36.6 C)  TempSrc: Oral Oral Oral Oral  SpO2:   100% 100%  Weight:      Height:        Intake/Output Summary (Last 24 hours) at 05/19/2018 1015 Last data filed at 05/19/2018 0400 Gross per 24 hour  Intake 418.68 ml  Output -  Net 418.68 ml   Filed Weights   05/17/18 1057 05/18/18 0407  Weight: 72.8 kg 73.3 kg    Telemetry    Sinus iwht PVC- Personally Reviewed    Physical Exam  Well developed and nourished in no acute distress HENT normal Neck supple with JVP-flat Clear Regular rate and rhythm, mechanical S2 and 3/6 systolic m Abd-soft with active BS No Clubbing cyanosis edema Skin-warm and dry A & Oriented  Grossly normal sensory and motor function    Labs    Chemistry Recent Labs  Lab 05/16/18 0907 05/17/18 1114 05/18/18 0332  NA 147* 144 146*  K 3.5 3.5 3.5  CL 117* 113* 112*  CO2 25 23 27   GLUCOSE 89 79 120*  BUN 12 10 14   CREATININE 0.99 0.91 0.88  CALCIUM 9.3 9.2 9.2  PROT  --  6.0*  --   ALBUMIN  --  3.5  --   AST  --  29  --   ALT  --  31  --   ALKPHOS  --  67  --   BILITOT  --  1.5*  --   GFRNONAA 59* >60 >60  GFRAA >60 >60 >60  ANIONGAP 5 8 7      Hematology Recent Labs  Lab 05/17/18 1114 05/18/18 0332 05/19/18 0159  WBC 4.4 4.7 5.2  RBC 4.79 4.75 4.55  HGB 12.3 12.3 11.7*  HCT 38.5 38.9 37.3  MCV 80.4 81.9 82.0  MCH 25.7* 25.9* 25.7*  MCHC 31.9 31.6 31.4  RDW 15.9* 15.9* 16.2*  PLT 184 163 178    Cardiac Enzymes Recent Labs  Lab 05/16/18 1230 05/17/18 1114  TROPONINI 0.11* <0.03    Recent Labs  Lab 05/16/18 0911  TROPIPOC 0.01     BNPNo results for input(s): BNP, PROBNP in the last 168 hours.   DDimer No results for input(s): DDIMER in the last 168 hours.   Radiology    No results found.   *   Assessment & Plan    ACS with + Troponin  CAD with prior CABG, post CABG NSTEMI and stenting  Thoracic aortic aneurysm repair; AAA  AVR mechanical  On coumadin  DYE allergy >>  HIVES  HTN    Admitted of cath with need for heparin bridge and dye prophylaxis  Orders written    NBP ok  Will give her something to sleep  Signed, 05/18/18, MD  05/19/2018, 10:15 AM

## 2018-05-19 NOTE — Progress Notes (Signed)
ANTICOAGULATION CONSULT NOTE  Pharmacy Consult for heparin Indication: mechanical AVR  Allergies  Allergen Reactions  . Contrast Media [Iodinated Diagnostic Agents] Hives, Itching and Swelling    Patient Measurements: Height: 5\' 6"  (167.6 cm) Weight: 161 lb 11.2 oz (73.3 kg) IBW/kg (Calculated) : 59.3 Heparin Dosing Weight: 72.8 kg  Vital Signs: Temp: 98.1 F (36.7 C) (08/18 1143) Temp Source: Oral (08/18 1143) BP: 110/84 (08/18 1143) Pulse Rate: 61 (08/18 1143)  Labs: Recent Labs    05/17/18 1114  05/18/18 0332 05/18/18 1418 05/19/18 0159 05/19/18 1431  HGB 12.3  --  12.3  --  11.7*  --   HCT 38.5  --  38.9  --  37.3  --   PLT 184  --  163  --  178  --   LABPROT 21.8*  --  20.4*  --  17.3*  --   INR 1.92  --  1.76  --  1.43  --   HEPARINUNFRC  --    < > 0.76* 0.62 0.76* 0.62  CREATININE 0.91  --  0.88  --   --   --   TROPONINI <0.03  --   --   --   --   --    < > = values in this interval not displayed.    Estimated Creatinine Clearance: 65.3 mL/min (by C-G formula based on SCr of 0.88 mg/dL).   Medications:  . sodium chloride    . sodium chloride    . [START ON 05/20/2018] sodium chloride     Followed by  . [START ON 05/20/2018] sodium chloride    . heparin 800 Units/hr (05/19/18 1530)    Assessment: 66 yof presenting with ischemic chest pain. On warfarin PTA on mechanical AVR. Goal per outpatient anticoagulation clinic is 2.5-3.5. PTA regimen listed as 5 mg daily except 2.5 mg on Mon/Thurs- pt reporting 2.5 mg on Tues/Thurs. Also on concurrent plavix.  Pharmacy consulted to manage heparin.  Heparin level came back therapeutic at 0.62, on 800 units/hr. Hgb 11.7, plt 178. No s/sx of bleeding. No infusion issues.   Goal of Therapy:  Heparin level 0.3-0.7 units/ml Monitor platelets by anticoagulation protocol: Yes    Plan:  Continue holding warfarin Continue heparin infusion at 800 units/hr - will order confirmatory HL in 6 hrs Monitor daily heparin  level, CBC, s/sx of bleeding Plan for cath Monday  Thank you for involving pharmacy in this patient's care.  Friday, PharmD Clinical Pharmacist  Pager: [phone] Phone: [phone] 05/19/2018 3:38 PM`

## 2018-05-19 NOTE — H&P (View-Only) (Signed)
 Progress Note  Patient Name: [name] Date of Encounter: 05/19/2018  Primary Cardiologist: [facility]  Primary Electrophysiologist: na   Patient Profile     66 y.o. female  with history of aortic insufficiency s/p St. 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   Subjective   No chest pain  Didn't sleep   Inpatient Medications    Scheduled Meds: . [START ON 05/20/2018] aspirin  81 mg Oral Pre-Cath  . aspirin EC  81 mg Oral Daily  . atorvastatin  80 mg Oral q1800  . clopidogrel  75 mg Oral Daily  . [START ON 05/20/2018] diphenhydrAMINE  50 mg Oral Once   Or  . [START ON 05/20/2018] diphenhydrAMINE  50 mg Intravenous Once  . isosorbide mononitrate  30 mg Oral Daily  . lisinopril  10 mg Oral Daily  . multivitamin with minerals  1 tablet Oral Daily  . pantoprazole  40 mg Oral Daily  . predniSONE  50 mg Oral Q6H  . sodium chloride flush  3 mL Intravenous Q12H  . sodium chloride flush  3 mL Intravenous Q12H   Continuous Infusions: . sodium chloride    . sodium chloride    . [START ON 05/20/2018] sodium chloride     Followed by  . [START ON 05/20/2018] sodium chloride    . heparin 800 Units/hr (05/19/18 0841)   PRN Meds: sodium chloride, sodium chloride, acetaminophen, nitroGLYCERIN, ondansetron (ZOFRAN) IV, polyvinyl alcohol, sodium chloride flush, sodium chloride flush   Vital Signs    Vitals:   05/18/18 0900 05/18/18 1155 05/18/18 2029 05/19/18 0556  BP: 112/87 (!) 141/52 (!) 121/50 (!) 122/56  Pulse:   61 (!) 38  Resp: (!) 24 20 15 20  Temp: 97.8 F (36.6 C) 97.9 F (36.6 C) 97.8  F (36.6 C) 97.8 F (36.6 C)  TempSrc: Oral Oral Oral Oral  SpO2:   100% 100%  Weight:      Height:        Intake/Output Summary (Last 24 hours) at 05/19/2018 1015 Last data filed at 05/19/2018 0400 Gross per 24 hour  Intake 418.68 ml  Output -  Net 418.68 ml   Filed Weights   05/17/18 1057 05/18/18 0407  Weight: 72.8 kg 73.3 kg    Telemetry    Sinus iwht PVC- Personally Reviewed    Physical Exam  Well developed and nourished in no acute distress HENT normal Neck supple with JVP-flat Clear Regular rate and rhythm, mechanical S2 and 3/6 systolic m Abd-soft with active BS No Clubbing cyanosis edema Skin-warm and dry A & Oriented  Grossly normal sensory and motor function    Labs    Chemistry Recent Labs  Lab 05/16/18 0907 05/17/18 1114 05/18/18 0332  NA 147* 144 146*  K 3.5 3.5 3.5  CL 117* 113* 112*  CO2 25 23 27  GLUCOSE 89 79 120*  BUN 12 10 14  CREATININE 0.99 0.91 0.88  CALCIUM 9.3 9.2 9.2  PROT  --  6.0*  --   ALBUMIN  --  3.5  --   AST  --  29  --   ALT  --    31  --   ALKPHOS  --  67  --   BILITOT  --  1.5*  --   GFRNONAA 59* >60 >60  GFRAA >60 >60 >60  ANIONGAP 5 8 7      Hematology Recent Labs  Lab 05/17/18 1114 05/18/18 0332 05/19/18 0159  WBC 4.4 4.7 5.2  RBC 4.79 4.75 4.55  HGB 12.3 12.3 11.7*  HCT 38.5 38.9 37.3  MCV 80.4 81.9 82.0  MCH 25.7* 25.9* 25.7*  MCHC 31.9 31.6 31.4  RDW 15.9* 15.9* 16.2*  PLT 184 163 178    Cardiac Enzymes Recent Labs  Lab 05/16/18 1230 05/17/18 1114  TROPONINI 0.11* <0.03    Recent Labs  Lab 05/16/18 0911  TROPIPOC 0.01     BNPNo results for input(s): BNP, PROBNP in the last 168 hours.   DDimer No results for input(s): DDIMER in the last 168 hours.   Radiology    No results found.   *   Assessment & Plan    ACS with + Troponin  CAD with prior CABG, post CABG NSTEMI and stenting  Thoracic aortic aneurysm repair; AAA  AVR mechanical  On coumadin  DYE allergy >>  HIVES  HTN    Admitted of cath with need for heparin bridge and dye prophylaxis  Orders written    NBP ok  Will give her something to sleep  Signed, 05/18/18, MD  05/19/2018, 10:15 AM

## 2018-05-19 NOTE — Progress Notes (Signed)
ANTICOAGULATION CONSULT NOTE  Pharmacy Consult for heparin Indication: mechanical AVR  Allergies  Allergen Reactions  . Contrast Media [Iodinated Diagnostic Agents] Hives, Itching and Swelling    Patient Measurements: Height: 5\' 6"  (167.6 cm) Weight: 161 lb 11.2 oz (73.3 kg) IBW/kg (Calculated) : 59.3 Heparin Dosing Weight: 72.8 kg  Vital Signs: Temp: 97.8 F (36.6 C) (08/18 0556) Temp Source: Oral (08/18 0556) BP: 122/56 (08/18 0556) Pulse Rate: 38 (08/18 0556)  Labs: Recent Labs    05/16/18 0907 05/16/18 1230 05/17/18 1114  05/18/18 0332 05/18/18 1418 05/19/18 0159  HGB 13.2  --  12.3  --  12.3  --  11.7*  HCT 42.3  --  38.5  --  38.9  --  37.3  PLT 173  --  184  --  163  --  178  LABPROT 21.9*  --  21.8*  --  20.4*  --  17.3*  INR 1.93  --  1.92  --  1.76  --  1.43  HEPARINUNFRC  --   --   --    < > 0.76* 0.62 0.76*  CREATININE 0.99  --  0.91  --  0.88  --   --   TROPONINI  --  0.11* <0.03  --   --   --   --    < > = values in this interval not displayed.    Estimated Creatinine Clearance: 65.3 mL/min (by C-G formula based on SCr of 0.88 mg/dL).   Medications:  . sodium chloride    . sodium chloride    . [START ON 05/20/2018] sodium chloride     Followed by  . [START ON 05/20/2018] sodium chloride    . heparin 900 Units/hr (05/19/18 0400)    Assessment: 66 yof presenting with ischemic chest pain. On warfarin PTA on mechanical AVR. Goal per outpatient anticoagulation clinic is 2.5-3.5. PTA regimen listed as 5 mg daily except 2.5 mg on Mon/Thurs- pt reporting 2.5 mg on Tues/Thurs. Also on concurrent plavix.  Pharmacy consulted to manage heparin.  8/18: Heparin level came back supratherapeutic at 0.76, on 900 units/hr. Hgb stable at 11.7, plt 178 and stable. No s/sx of bleeding. No infusion issues noted per RN.  Goal of Therapy:  Heparin level 0.3-0.7 units/ml Monitor platelets by anticoagulation protocol: Yes    Plan:  Continue holding  warfarin Decrease heparin infusion to 800 units/hr Will order heparin level in 6 hours @ 1500 Monitor daily heparin level, CBC, s/sx of bleeding Plan for cath Monday  Thank you for involving pharmacy in this patient's care.  Sunday, PharmD PGY1 Pharmacy Resident Phone: [phone] 05/19/2018 8:07 AM`

## 2018-05-20 ENCOUNTER — Inpatient Hospital Stay (HOSPITAL_COMMUNITY)
Admission: RE | Disposition: A | Discharge status: Home / Self Care | Payer: Self-pay | Source: Home / Self Care | Attending: Internal Medicine

## 2018-05-20 ENCOUNTER — Ambulatory Visit (HOSPITAL_COMMUNITY)
Admission: RE | Admit: 2018-05-20 | Payer: Commercial Managed Care - PPO | Source: Ambulatory Visit | Admitting: Interventional Cardiology

## 2018-05-20 DIAGNOSIS — I25110 Atherosclerotic heart disease of native coronary artery with unstable angina pectoris: Secondary | ICD-10-CM

## 2018-05-20 HISTORY — PX: BYPASS GRAFT ANGIOGRAPHY: CATH118229

## 2018-05-20 LAB — POCT ACTIVATED CLOTTING TIME
ACTIVATED CLOTTING TIME: 191 s
ACTIVATED CLOTTING TIME: 175 s

## 2018-05-20 LAB — POCT I-STAT 3, ART BLOOD GAS (G3+)
ACID-BASE DEFICIT: 3 mmol/L — AB (ref 0.0–2.0)
Bicarbonate: 22.9 mmol/L (ref 20.0–28.0)
O2 Saturation: 98 %
PH ART: 7.344 — AB (ref 7.350–7.450)
TCO2: 24 mmol/L (ref 22–32)
pCO2 arterial: 42 mmHg (ref 32.0–48.0)
pO2, Arterial: 103 mmHg (ref 83.0–108.0)

## 2018-05-20 LAB — POCT I-STAT 3, VENOUS BLOOD GAS (G3P V)
Acid-base deficit: 2 mmol/L (ref 0.0–2.0)
Bicarbonate: 24.1 mmol/L (ref 20.0–28.0)
O2 Saturation: 76 %
PCO2 VEN: 44.9 mmHg (ref 44.0–60.0)
PH VEN: 7.338 (ref 7.250–7.430)
PO2 VEN: 44 mmHg (ref 32.0–45.0)
TCO2: 25 mmol/L (ref 22–32)

## 2018-05-20 SURGERY — RIGHT HEART CATH AND CORONARY ANGIOGRAPHY
Anesthesia: LOCAL

## 2018-05-20 MED ORDER — HEPARIN (PORCINE) IN NACL 1000-0.9 UT/500ML-% IV SOLN
INTRAVENOUS | Status: AC
  Filled 2018-05-20: qty 500

## 2018-05-20 MED ORDER — SODIUM CHLORIDE 0.9 % IV SOLN: INTRAVENOUS | Status: DC

## 2018-05-20 MED ORDER — ONDANSETRON HCL 4 MG/2ML IJ SOLN: 4.0000 mg | Freq: Four times a day (QID) | INTRAMUSCULAR | Status: DC | PRN

## 2018-05-20 MED ORDER — MIDAZOLAM HCL 2 MG/2ML IJ SOLN
INTRAMUSCULAR | Status: DC | PRN
  Administered 2018-05-20 (×2): 1 mg via INTRAVENOUS

## 2018-05-20 MED ORDER — MIDAZOLAM HCL 2 MG/2ML IJ SOLN
INTRAMUSCULAR | Status: AC
  Filled 2018-05-20: qty 2

## 2018-05-20 MED ORDER — ACETAMINOPHEN 325 MG PO TABS: 650.0000 mg | ORAL_TABLET | ORAL | Status: DC | PRN

## 2018-05-20 MED ORDER — FENTANYL CITRATE (PF) 100 MCG/2ML IJ SOLN
INTRAMUSCULAR | Status: DC | PRN
  Administered 2018-05-20 (×2): 25 ug via INTRAVENOUS

## 2018-05-20 MED ORDER — NITROGLYCERIN IN D5W 200-5 MCG/ML-% IV SOLN
0.0000 ug/min | INTRAVENOUS | Status: DC
  Administered 2018-05-20: 30 ug/min via INTRAVENOUS

## 2018-05-20 MED ORDER — SODIUM CHLORIDE 0.9 % IV SOLN
250.0000 mL | INTRAVENOUS | Status: DC | PRN
  Administered 2018-05-20: 250 mL via INTRAVENOUS

## 2018-05-20 MED ORDER — HEPARIN SODIUM (PORCINE) 1000 UNIT/ML IJ SOLN
INTRAMUSCULAR | Status: AC
  Filled 2018-05-20: qty 1

## 2018-05-20 MED ORDER — HEPARIN SODIUM (PORCINE) 1000 UNIT/ML IJ SOLN
INTRAMUSCULAR | Status: DC | PRN
  Administered 2018-05-20: 3000 [IU] via INTRAVENOUS

## 2018-05-20 MED ORDER — NITROGLYCERIN IN D5W 200-5 MCG/ML-% IV SOLN
INTRAVENOUS | Status: AC | PRN
  Administered 2018-05-20: 30 ug/min via INTRAVENOUS

## 2018-05-20 MED ORDER — SODIUM CHLORIDE 0.9% FLUSH
3.0000 mL | Freq: Two times a day (BID) | INTRAVENOUS | Status: DC
  Administered 2018-05-21: 3 mL via INTRAVENOUS

## 2018-05-20 MED ORDER — SODIUM CHLORIDE 0.9% FLUSH: 3.0000 mL | INTRAVENOUS | Status: DC | PRN

## 2018-05-20 MED ORDER — NITROGLYCERIN IN D5W 200-5 MCG/ML-% IV SOLN
INTRAVENOUS | Status: AC | PRN
  Administered 2018-05-20: 20 ug/min via INTRAVENOUS

## 2018-05-20 MED ORDER — FENTANYL CITRATE (PF) 100 MCG/2ML IJ SOLN
INTRAMUSCULAR | Status: AC
  Filled 2018-05-20: qty 2

## 2018-05-20 MED ORDER — LIDOCAINE HCL (PF) 1 % IJ SOLN
INTRAMUSCULAR | Status: AC
  Filled 2018-05-20: qty 30

## 2018-05-20 MED ORDER — IOHEXOL 350 MG/ML SOLN
INTRAVENOUS | Status: DC | PRN
  Administered 2018-05-20: 250 mL via INTRA_ARTERIAL

## 2018-05-20 MED ORDER — LIDOCAINE HCL (PF) 1 % IJ SOLN
INTRAMUSCULAR | Status: DC | PRN
  Administered 2018-05-20: 18 mL

## 2018-05-20 MED ORDER — MIDAZOLAM HCL 2 MG/2ML IJ SOLN
INTRAMUSCULAR | Status: DC | PRN
  Administered 2018-05-20: 2 mg via INTRAVENOUS

## 2018-05-20 MED ORDER — HEPARIN (PORCINE) IN NACL 1000-0.9 UT/500ML-% IV SOLN
INTRAVENOUS | Status: DC | PRN
  Administered 2018-05-20 (×2): 500 mL

## 2018-05-20 MED ORDER — NITROGLYCERIN IN D5W 200-5 MCG/ML-% IV SOLN
INTRAVENOUS | Status: AC
  Filled 2018-05-20: qty 250

## 2018-05-20 MED ORDER — HEPARIN (PORCINE) IN NACL 100-0.45 UNIT/ML-% IJ SOLN
800.0000 [IU]/h | INTRAMUSCULAR | Status: DC
  Administered 2018-05-20: 800 [IU]/h via INTRAVENOUS
  Filled 2018-05-20 (×2): qty 250

## 2018-05-20 MED ORDER — FENTANYL CITRATE (PF) 100 MCG/2ML IJ SOLN
INTRAMUSCULAR | Status: DC | PRN
  Administered 2018-05-20: 25 ug via INTRAVENOUS

## 2018-05-20 SURGICAL SUPPLY — 26 items
CATH INFINITI 5 FR LCB (CATHETERS) ×1 IMPLANT
CATH INFINITI 5 FR MPA2 (CATHETERS) ×1 IMPLANT
CATH INFINITI 5FR JL5 (CATHETERS) ×1 IMPLANT
CATH INFINITI 5FR MULTPACK ANG (CATHETERS) ×1 IMPLANT
CATH LAUNCHER 5F ALR12 (CATHETERS) IMPLANT
CATH LAUNCHER 5F EBU3.5 (CATHETERS) ×1 IMPLANT
CATH LAUNCHER 5F IMA (CATHETERS) IMPLANT
CATH LAUNCHER 5F RADR (CATHETERS) IMPLANT
CATH SWAN GANZ 7F STRAIGHT (CATHETERS) ×1 IMPLANT
CATHETER LAUNCHER 5F ALR12 (CATHETERS) ×3
CATHETER LAUNCHER 5F IMA (CATHETERS) ×3
CATHETER LAUNCHER 5F RADR (CATHETERS) ×3
GLIDESHEATH SLEND SS 6F .021 (SHEATH) IMPLANT
GUIDEWIRE INQWIRE 1.5J.035X260 (WIRE) IMPLANT
INQWIRE 1.5J .035X260CM (WIRE)
KIT HEART LEFT (KITS) ×3 IMPLANT
KIT HEMO VALVE WATCHDOG (MISCELLANEOUS) ×1 IMPLANT
PACK CARDIAC CATHETERIZATION (CUSTOM PROCEDURE TRAY) ×3 IMPLANT
SHEATH PINNACLE 5F 10CM (SHEATH) ×1 IMPLANT
SHEATH PINNACLE 7F 10CM (SHEATH) ×1 IMPLANT
SHEATH PROBE COVER 6X72 (BAG) ×2 IMPLANT
SYR MEDRAD MARK V 150ML (SYRINGE) ×1 IMPLANT
TRANSDUCER W/STOPCOCK (MISCELLANEOUS) ×3 IMPLANT
TUBING CIL FLEX 10 FLL-RA (TUBING) ×3 IMPLANT
WIRE ASAHI PROWATER 180CM (WIRE) ×1 IMPLANT
WIRE EMERALD 3MM-J .035X150CM (WIRE) ×1 IMPLANT

## 2018-05-20 NOTE — Progress Notes (Signed)
Rcd pt. Pt. Does not offer any complaints. RFA/RFV site soft, dressing c/d/i, no s/s of hematoma or ecchymosis noted to site. Pt. HR 69 on monitor, call bell placed. Purewick in place, awaiting bed assignment.

## 2018-05-20 NOTE — Interval H&P Note (Signed)
Cath Lab Visit (complete for each Cath Lab visit)  Clinical Evaluation Leading to the Procedure:   ACS: Yes.    Non-ACS:    Anginal Classification: CCS IV  Anti-ischemic medical therapy: Maximal Therapy (2 or more classes of medications)  Non-Invasive Test Results: No non-invasive testing performed  Prior CABG: Previous CABG      History and Physical Interval Note:  05/20/2018 7:37 AM  [name]  has presented today for surgery, with the diagnosis of chest pain  The various methods of treatment have been discussed with the patient and family. After consideration of risks, benefits and other options for treatment, the patient has consented to  Procedure(s): RIGHT/LEFT HEART CATH AND CORONARY ANGIOGRAPHY (N/A) as a surgical intervention .  The patient's history has been reviewed, patient examined, no change in status, stable for surgery.  I have reviewed the patient's chart and labs.  Questions were answered to the patient's satisfaction.     Lance Muss

## 2018-05-20 NOTE — Progress Notes (Addendum)
ANTICOAGULATION CONSULT NOTE  Pharmacy Consult for heparin Indication: mechanical AVR  Allergies  Allergen Reactions  . Contrast Media [Iodinated Diagnostic Agents] Hives, Itching and Swelling    Patient Measurements: Height: 5\' 6"  (167.6 cm) Weight: 163 lb 4.8 oz (74.1 kg)(Scale A) IBW/kg (Calculated) : 59.3 Heparin Dosing Weight: 72.8 kg  Vital Signs:    Labs: Recent Labs    05/17/18 1114  05/18/18 0332  05/19/18 0159 05/19/18 1431 05/19/18 2306  HGB 12.3  --  12.3  --  11.7*  --  12.6  HCT 38.5  --  38.9  --  37.3  --  40.2  PLT 184  --  163  --  178  --  184  LABPROT 21.8*  --  20.4*  --  17.3*  --  14.8  INR 1.92  --  1.76  --  1.43  --  1.17  HEPARINUNFRC  --    < > 0.76*   < > 0.76* 0.62 0.59  CREATININE 0.91  --  0.88  --   --   --   --   TROPONINI <0.03  --   --   --   --   --   --    < > = values in this interval not displayed.    Estimated Creatinine Clearance: 65.6 mL/min (by C-G formula based on SCr of 0.88 mg/dL).   Medications:  . sodium chloride    . heparin 800 Units/hr (05/20/18 0431)    Assessment: 66 yof presenting with ischemic chest pain. On warfarin PTA on mechanical AVR. PTA regimen listed as 5 mg daily except 2.5 mg on Mon/Thurs- pt reporting 2.5 mg on Tues/Thurs. Also on concurrent plavix.  Pharmacy consulted to manage heparin.  Of note: INR goal per outpatient anticoagulation clinic is 2.5-3.5. Discussed goal with clinic provider who confirmed true INR goal should be 2-3  Heparin level came back therapeutic at 0.59, on 800 units/hr. Hgb 12.6, plt 184. No s/sx of bleeding. No infusion issues per RN.   Goal of Therapy:  Heparin level 0.3-0.7 units/ml Monitor platelets by anticoagulation protocol: Yes    Plan:  Continue holding warfarin Continue heparin infusion at 800 units/hr For cath this AM - f/u after cath  Thank you for involving pharmacy in this patient's care.  05/22/18, PharmD., BCPS Clinical  Pharmacist Clinical phone for 05/20/18 until 3:30pm: [phone] If after 3:30pm, please refer to [facility] for unit-specific pharmacist   Addendum: Patient is now s/p LHC. Pharmacy consulted to resume IV heparin 8 hours post sheath pull which occurred at 0941. 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 Cardiology to explore revascularization options. Will plan to resume IV heparin at 800 units/hr starting at 1800 today. Will f/u 6 hr HL post heparin start.   [facility], PharmD., BCPS Clinical Pharmacist Clinical phone for 05/20/18 until 3:30pm: [phone] If after 3:30pm, please refer to [facility] for unit-specific pharmacist

## 2018-05-20 NOTE — Progress Notes (Signed)
ANTICOAGULATION CONSULT NOTE Pharmacy Consult for heparin Indication: mechanical AVR  Allergies  Allergen Reactions  . Contrast Media [Iodinated Diagnostic Agents] Hives, Itching and Swelling    Patient Measurements: Height: 5\' 6"  (167.6 cm) Weight: 161 lb 11.2 oz (73.3 kg) IBW/kg (Calculated) : 59.3 Heparin Dosing Weight: 72.8 kg  Vital Signs:    Labs: Recent Labs    05/17/18 1114  05/18/18 0332  05/19/18 0159 05/19/18 1431 05/19/18 2306  HGB 12.3  --  12.3  --  11.7*  --  12.6  HCT 38.5  --  38.9  --  37.3  --  40.2  PLT 184  --  163  --  178  --  184  LABPROT 21.8*  --  20.4*  --  17.3*  --  14.8  INR 1.92  --  1.76  --  1.43  --  1.17  HEPARINUNFRC  --    < > 0.76*   < > 0.76* 0.62 0.59  CREATININE 0.91  --  0.88  --   --   --   --   TROPONINI <0.03  --   --   --   --   --   --    < > = values in this interval not displayed.    Estimated Creatinine Clearance: 65.3 mL/min (by C-G formula based on SCr of 0.88 mg/dL).  Assessment: 67 y.o. female with AVR, Coumadin on hold, for heparin Goal of Therapy:  Heparin level 0.3-0.7 units/ml Monitor platelets by anticoagulation protocol: Yes    Plan:  Continue Heparin at current rate  76, PharmD, BCPS  05/20/2018 12:18 AM`

## 2018-05-20 NOTE — Progress Notes (Signed)
Rt femoral arterial sheath removed and pressure held by Nita Sells. Rt groin level 1 pre sheath pull, rt dp palpable, manual pressure held for x25 minutes. Rt femoral venous sheath removed and pressure held by Nita Sells. Rt groin level 1 pre sheath pull, rt dp palpable, manual pressure held for x20 minutes. Rt groin level 0 post sheath pull, rt dp palpable, bed rest starts at 1100 Instructions reviewed with patient.

## 2018-05-20 NOTE — Progress Notes (Signed)
Pt neuro intact, denies pain since arriving to cvicu @ 1630. VSS, nitro gtts off @ 1800-SBP stable, MAP 70's. Right groin cath site WNL. + pulses. RA, LSCTA. Normal heart sounds, click. Heparin gtts initiated per order @ 1800. Voiding without issue, up with minimal assist

## 2018-05-20 NOTE — Progress Notes (Signed)
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 She now has unstable angina with vein graft disease which has been previously stented two years ago.  The patient would not survive a third sternotomy for CABG Rec repeat PCI  Objective: Vital signs in last 24 hours: Temp:  [98 F (36.7 C)] 98 F (36.7 C) (08/19 1700) Pulse Rate:  [46-110] 71 (08/19 1700) Cardiac Rhythm: Normal sinus rhythm (08/19 1700) Resp:  [11-28] 19 (08/19 1700) BP: (119-186)/(40-96) 119/70 (08/19 1700) SpO2:  [96 %-100 %] 100 % (08/19 1700) Weight:  [74.1 kg] 74.1 kg (08/19 0604)  Hemodynamic parameters for last 24 hours:    Intake/Output from previous day: 08/18 0701 - 08/19 0700 In: 416.8 [I.V.:416.8] Out: -  Intake/Output this shift: Total I/O In: 377.7 [P.O.:300; I.V.:77.7] Out: -   Comfortable after diagnostic cath  Lab Results: Recent Labs    05/19/18 0159 05/19/18 2306  WBC 5.2 4.9  HGB 11.7* 12.6  HCT 37.3 40.2  PLT 178 184   BMET:  Recent Labs    05/18/18 0332  NA 146*  K 3.5  CL 112*  CO2 27  GLUCOSE 120*  BUN 14  CREATININE 0.88  CALCIUM 9.2    PT/INR:  Recent Labs    05/19/18 2306  LABPROT 14.8  INR 1.17   ABG    Component Value Date/Time   PHART 7.437 11/22/2012 0345   HCO3 26.9 (H) 11/22/2012 0345   TCO2 28.1 11/22/2012 0345   ACIDBASEDEF 1.0 11/05/2012 1921   O2SAT 96.2 11/22/2012 0345   CBG (last 3)  No results for input(s): GLUCAP in the last 72 hours.  Assessment/Plan: S/P Procedure(s) (LRB): RIGHT HEART CATH AND CORONARY ANGIOGRAPHY (N/A) BYPASS GRAFT ANGIOGRAPHY I discussed the situation with the patient. She  understands there are no further surgical options.   LOS: 3 days    Kathlee Nations Trigt III 05/20/2018

## 2018-05-21 ENCOUNTER — Encounter (HOSPITAL_COMMUNITY)
Admission: RE | Disposition: A | Discharge status: Home / Self Care | Payer: Self-pay | Source: Home / Self Care | Attending: Internal Medicine

## 2018-05-21 ENCOUNTER — Encounter (HOSPITAL_COMMUNITY): Payer: Self-pay | Admitting: Interventional Cardiology

## 2018-05-21 ENCOUNTER — Other Ambulatory Visit (HOSPITAL_COMMUNITY): Payer: Commercial Managed Care - PPO

## 2018-05-21 HISTORY — PX: CORONARY STENT INTERVENTION: CATH118234

## 2018-05-21 HISTORY — PX: IABP INSERTION: CATH118242

## 2018-05-21 LAB — BASIC METABOLIC PANEL
Anion gap: 4 — ABNORMAL LOW (ref 5–15)
BUN: 13 mg/dL (ref 8–23)
CHLORIDE: 115 mmol/L — AB (ref 98–111)
CO2: 24 mmol/L (ref 22–32)
Calcium: 8.1 mg/dL — ABNORMAL LOW (ref 8.9–10.3)
Creatinine, Ser: 0.74 mg/dL (ref 0.44–1.00)
GFR calc Af Amer: >60 mL/min (ref >60)
GFR calc non Af Amer: >60 mL/min (ref >60)
GLUCOSE: 95 mg/dL (ref 70–99)
Potassium: 3 mmol/L — ABNORMAL LOW (ref 3.5–5.1)
Sodium: 143 mmol/L (ref 135–145)

## 2018-05-21 LAB — PROTIME-INR
INR: 1.19
Prothrombin Time: 15 seconds (ref 11.4–15.2)

## 2018-05-21 LAB — CBC
HEMATOCRIT: 34.6 % — AB (ref 36.0–46.0)
Hemoglobin: 11.1 g/dL — ABNORMAL LOW (ref 12.0–15.0)
MCH: 26.2 pg (ref 26.0–34.0)
MCHC: 32.1 g/dL (ref 30.0–36.0)
MCV: 81.8 fL (ref 78.0–100.0)
Platelets: 159 10*3/uL (ref 150–400)
RBC: 4.23 MIL/uL (ref 3.87–5.11)
RDW: 16.3 % — AB (ref 11.5–15.5)
WBC: 15.6 10*3/uL — ABNORMAL HIGH (ref 4.0–10.5)

## 2018-05-21 LAB — POCT ACTIVATED CLOTTING TIME
ACTIVATED CLOTTING TIME: 516 s
ACTIVATED CLOTTING TIME: 169 s
Activated Clotting Time: 153 seconds
Activated Clotting Time: 164 seconds

## 2018-05-21 LAB — HEPARIN LEVEL (UNFRACTIONATED)
Heparin Unfractionated: 0.33 IU/mL (ref 0.30–0.70)
Heparin Unfractionated: 0.55 IU/mL (ref 0.30–0.70)

## 2018-05-21 SURGERY — CORONARY STENT INTERVENTION
Anesthesia: LOCAL

## 2018-05-21 MED ORDER — LIDOCAINE HCL (PF) 1 % IJ SOLN
INTRAMUSCULAR | Status: DC | PRN
  Administered 2018-05-21 (×3): 15 mL via INTRADERMAL

## 2018-05-21 MED ORDER — BIVALIRUDIN BOLUS VIA INFUSION - CUPID
INTRAVENOUS | Status: DC | PRN
  Administered 2018-05-21 (×2): 56.55 mg via INTRAVENOUS

## 2018-05-21 MED ORDER — FENTANYL CITRATE (PF) 100 MCG/2ML IJ SOLN
INTRAMUSCULAR | Status: DC | PRN
  Administered 2018-05-21 (×2): 25 ug via INTRAVENOUS

## 2018-05-21 MED ORDER — MIDAZOLAM HCL 2 MG/2ML IJ SOLN
INTRAMUSCULAR | Status: AC
  Filled 2018-05-21: qty 2

## 2018-05-21 MED ORDER — SODIUM CHLORIDE 0.9 % IV SOLN
INTRAVENOUS | Status: DC | PRN
  Administered 2018-05-21 (×2): 1.75 mg/kg/h via INTRAVENOUS

## 2018-05-21 MED ORDER — SODIUM CHLORIDE 0.9 % IV SOLN: INTRAVENOUS | Status: AC

## 2018-05-21 MED ORDER — HEPARIN (PORCINE) IN NACL 100-0.45 UNIT/ML-% IJ SOLN: 800.0000 [IU]/h | INTRAMUSCULAR | Status: DC

## 2018-05-21 MED ORDER — ASPIRIN 81 MG PO CHEW: 81.0000 mg | CHEWABLE_TABLET | ORAL | Status: DC

## 2018-05-21 MED ORDER — METHYLPREDNISOLONE SODIUM SUCC 125 MG IJ SOLR
60.0000 mg | Freq: Once | INTRAMUSCULAR | Status: AC
  Administered 2018-05-21: 60 mg via INTRAVENOUS
  Filled 2018-05-21: qty 2

## 2018-05-21 MED ORDER — HEPARIN (PORCINE) IN NACL 1000-0.9 UT/500ML-% IV SOLN
INTRAVENOUS | Status: DC | PRN
  Administered 2018-05-21 (×4): 500 mL

## 2018-05-21 MED ORDER — WARFARIN - PHARMACIST DOSING INPATIENT
Freq: Every day | Status: DC
  Administered 2018-05-22: 17:00:00

## 2018-05-21 MED ORDER — HEPARIN (PORCINE) IN NACL 1000-0.9 UT/500ML-% IV SOLN
INTRAVENOUS | Status: AC
  Filled 2018-05-21: qty 500

## 2018-05-21 MED ORDER — ASPIRIN 81 MG PO CHEW: 81.0000 mg | CHEWABLE_TABLET | Freq: Every day | ORAL | Status: DC

## 2018-05-21 MED ORDER — BIVALIRUDIN TRIFLUOROACETATE 250 MG IV SOLR
INTRAVENOUS | Status: AC
  Filled 2018-05-21: qty 250

## 2018-05-21 MED ORDER — SODIUM CHLORIDE 0.9 % IV SOLN: 250.0000 mL | INTRAVENOUS | Status: DC | PRN

## 2018-05-21 MED ORDER — ONDANSETRON HCL 4 MG/2ML IJ SOLN: 4.0000 mg | Freq: Four times a day (QID) | INTRAMUSCULAR | Status: DC | PRN

## 2018-05-21 MED ORDER — LIDOCAINE HCL (PF) 1 % IJ SOLN
INTRAMUSCULAR | Status: AC
  Filled 2018-05-21: qty 30

## 2018-05-21 MED ORDER — HEPARIN (PORCINE) IN NACL 100-0.45 UNIT/ML-% IJ SOLN
800.0000 [IU]/h | INTRAMUSCULAR | Status: DC
  Administered 2018-05-22 – 2018-05-23 (×2): 800 [IU]/h via INTRAVENOUS
  Filled 2018-05-21 (×2): qty 250

## 2018-05-21 MED ORDER — NITROGLYCERIN 1 MG/10 ML FOR IR/CATH LAB
INTRA_ARTERIAL | Status: AC
  Filled 2018-05-21: qty 10

## 2018-05-21 MED ORDER — SODIUM CHLORIDE 0.9 % IV SOLN
INTRAVENOUS | Status: DC
  Administered 2018-05-21: 75 mL/h via INTRAVENOUS

## 2018-05-21 MED ORDER — SODIUM CHLORIDE 0.9% FLUSH: 3.0000 mL | INTRAVENOUS | Status: DC | PRN

## 2018-05-21 MED ORDER — HYDRALAZINE HCL 20 MG/ML IJ SOLN: 5.0000 mg | INTRAMUSCULAR | Status: AC | PRN

## 2018-05-21 MED ORDER — MIDAZOLAM HCL 2 MG/2ML IJ SOLN
INTRAMUSCULAR | Status: DC | PRN
  Administered 2018-05-21: 2 mg via INTRAVENOUS
  Administered 2018-05-21: 1 mg via INTRAVENOUS

## 2018-05-21 MED ORDER — SODIUM CHLORIDE 0.9% FLUSH: 3.0000 mL | Freq: Two times a day (BID) | INTRAVENOUS | Status: DC

## 2018-05-21 MED ORDER — HEPARIN (PORCINE) IN NACL 1000-0.9 UT/500ML-% IV SOLN
INTRAVENOUS | Status: AC
  Filled 2018-05-21: qty 1000

## 2018-05-21 MED ORDER — CLOPIDOGREL BISULFATE 75 MG PO TABS
75.0000 mg | ORAL_TABLET | Freq: Every day | ORAL | Status: DC
  Administered 2018-05-22 – 2018-05-24 (×3): 75 mg via ORAL
  Filled 2018-05-21 (×3): qty 1

## 2018-05-21 MED ORDER — LABETALOL HCL 5 MG/ML IV SOLN: 10.0000 mg | INTRAVENOUS | Status: AC | PRN

## 2018-05-21 MED ORDER — DIPHENHYDRAMINE HCL 50 MG/ML IJ SOLN
25.0000 mg | Freq: Once | INTRAMUSCULAR | Status: AC
  Administered 2018-05-21: 25 mg via INTRAVENOUS
  Filled 2018-05-21: qty 1

## 2018-05-21 MED ORDER — WARFARIN SODIUM 5 MG PO TABS
5.0000 mg | ORAL_TABLET | ORAL | Status: AC
  Administered 2018-05-21: 5 mg via ORAL
  Filled 2018-05-21: qty 1

## 2018-05-21 MED ORDER — IOHEXOL 350 MG/ML SOLN
INTRAVENOUS | Status: DC | PRN
  Administered 2018-05-21: 135 mL via INTRACARDIAC

## 2018-05-21 MED ORDER — FENTANYL CITRATE (PF) 100 MCG/2ML IJ SOLN
INTRAMUSCULAR | Status: AC
  Filled 2018-05-21: qty 2

## 2018-05-21 MED ORDER — POTASSIUM CHLORIDE CRYS ER 20 MEQ PO TBCR
40.0000 meq | EXTENDED_RELEASE_TABLET | Freq: Once | ORAL | Status: AC
  Administered 2018-05-21: 40 meq via ORAL
  Filled 2018-05-21: qty 2

## 2018-05-21 MED ORDER — ACETAMINOPHEN 325 MG PO TABS: 650.0000 mg | ORAL_TABLET | ORAL | Status: DC | PRN

## 2018-05-21 MED ORDER — SODIUM CHLORIDE 0.9% FLUSH
3.0000 mL | Freq: Two times a day (BID) | INTRAVENOUS | Status: DC
  Administered 2018-05-22 – 2018-05-24 (×2): 3 mL via INTRAVENOUS

## 2018-05-21 MED FILL — Lidocaine HCl Local Preservative Free (PF) Inj 1%: INTRAMUSCULAR | Qty: 30 | Status: AC

## 2018-05-21 SURGICAL SUPPLY — 22 items
BALLN IABP SENSA PLUS 8F 50CC (BALLOONS) ×2
BALLN SAPPHIRE 3.0X15 (BALLOONS) ×2
BALLN ~~LOC~~ EUPHORA RX 5.0X8 (BALLOONS) ×2
BALLOON IABP SENS PLUS 8F 50CC (BALLOONS) IMPLANT
BALLOON SAPPHIRE 3.0X15 (BALLOONS) IMPLANT
BALLOON ~~LOC~~ EUPHORA RX 5.0X8 (BALLOONS) IMPLANT
CATH LAUNCHER 6FR EBU3.5 (CATHETERS) ×1 IMPLANT
ELECT DEFIB PAD ADLT CADENCE (PAD) ×1 IMPLANT
HOVERMATT SINGLE USE (MISCELLANEOUS) ×1 IMPLANT
KIT ENCORE 26 ADVANTAGE (KITS) ×1 IMPLANT
KIT ESSENTIALS PG (KITS) ×2 IMPLANT
KIT HEART LEFT (KITS) ×2 IMPLANT
KIT HEMO VALVE WATCHDOG (MISCELLANEOUS) ×1 IMPLANT
PACK CARDIAC CATHETERIZATION (CUSTOM PROCEDURE TRAY) ×2 IMPLANT
SHEATH PINNACLE 6F 10CM (SHEATH) ×1 IMPLANT
SHEATH PROBE COVER 6X72 (BAG) ×1 IMPLANT
STENT RESOLUTE ONYX 5.0X12 (Permanent Stent) ×1 IMPLANT
TRANSDUCER W/STOPCOCK (MISCELLANEOUS) ×2 IMPLANT
TUBING CIL FLEX 10 FLL-RA (TUBING) ×2 IMPLANT
WIRE ASAHI PROWATER 180CM (WIRE) ×1 IMPLANT
WIRE EMERALD 3MM-J .035X150CM (WIRE) ×1 IMPLANT
WIRE FIGHTER CROSSING 190CM (WIRE) ×1 IMPLANT

## 2018-05-21 NOTE — Progress Notes (Signed)
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  She has had 2 previous surgeries. Had stenting of the saphenous vein graft to the LAD and circumflex artery.  The stent has stenosed.  Cath yesterday revealed a tight stenosis in the ostium of the graft.  Dr. Eldridge Dace was not able to fully engage the catheter so PCI was not performed.   Inpatient Medications    Scheduled Meds: . aspirin EC  81 mg Oral Daily  . atorvastatin  80 mg Oral q1800  . clopidogrel  75 mg Oral Daily  . isosorbide mononitrate  30 mg Oral Daily  . lisinopril  10 mg Oral Daily  . multivitamin with minerals  1 tablet Oral Daily  . pantoprazole  40 mg Oral Daily  . sodium chloride flush  3 mL Intravenous Q12H  . sodium chloride flush  3 mL Intravenous Q12H   Continuous Infusions: . sodium chloride    . sodium chloride 10 mL/hr at 05/21/18 0800  . heparin 800 Units/hr (05/21/18 0800)  . nitroGLYCERIN Stopped (05/20/18 1800)   PRN Meds: sodium chloride, sodium chloride, acetaminophen, nitroGLYCERIN, ondansetron (ZOFRAN) IV, polyvinyl alcohol, sodium chloride flush, sodium chloride flush, zolpidem   Vital Signs    Vitals:   05/21/18 0600 05/21/18 0700 05/21/18 0800 05/21/18 0838  BP: 139/65 (!) 136/46 (!) 137/57   Pulse: (!) 41 85 (!) 144   Resp: 14 16 19    Temp:    97.9 F (36.6 C)  TempSrc:   Oral Oral  SpO2: 100% 99% 97%   Weight: 75.4 kg     Height:        Intake/Output Summary (Last 24 hours) at 05/21/2018 0842 Last data filed at 05/21/2018 0800 Gross per 24 hour  Intake 1119.79 ml  Output -  Net 1119.79 ml   Filed Weights   05/18/18 0407 05/20/18 0604 05/21/18 0600  Weight: 73.3 kg 74.1 kg 75.4 kg    Telemetry    NSR  - Personally Reviewed  ECG     NSR  - Personally Reviewed  Physical Exam   GEN: No acute  distress.  Pain free.  Neck: No JVD Cardiac: RRR, no murmurs, rubs, or gallops.  Respiratory: Clear to auscultation bilaterally. GI: Soft, nontender, non-distended  MS: No edema; No deformity.,  Right fem cath site looks good  Neuro:  Nonfocal  Psych: Normal affect   Labs    Chemistry Recent Labs  Lab 05/16/18 0907 05/17/18 1114 05/18/18 0332  NA 147* 144 146*  K 3.5 3.5 3.5  CL 117* 113* 112*  CO2 25 23 27   GLUCOSE 89 79 120*  BUN 12 10 14   CREATININE 0.99 0.91 0.88  CALCIUM 9.3 9.2 9.2  PROT  --  6.0*  --   ALBUMIN  --  3.5  --   AST  --  29  --   ALT  --  31  --   ALKPHOS  --  67  --   BILITOT  --  1.5*  --   GFRNONAA 59* >60 >60  GFRAA >60 >60 >60  ANIONGAP 5 8 7      Hematology Recent Labs  Lab 05/19/18 0159 05/19/18 2306 05/21/18 0049  WBC 5.2 4.9 15.6*  RBC 4.55 4.90 4.23  HGB 11.7* 12.6 11.1*  HCT 37.3 40.2 34.6*  MCV 82.0  82.0 81.8  MCH 25.7* 25.7* 26.2  MCHC 31.4 31.3 32.1  RDW 16.2* 16.0* 16.3*  PLT 178 184 159    Cardiac Enzymes Recent Labs  Lab 05/16/18 1230 05/17/18 1114  TROPONINI 0.11* <0.03    Recent Labs  Lab 05/16/18 0911  TROPIPOC 0.01     BNPNo results for input(s): BNP, PROBNP in the last 168 hours.   DDimer No results for input(s): DDIMER in the last 168 hours.   Radiology    No results found.  Cardiac Studies      Patient Profile     67 y.o. female with CAD and AVR / Bentall procedure   Assessment & Plan    1.  Coronary artery disease: The patient has restenosis of the stent in the ostium of the saphenous vein graft to the LAD and left circumflex artery. Dr. Eldridge Dace  was not able to fully engage this graft so no PCI was done. He is considering PCI of the native left main.  He will be discussing the case with Dr. Excell Seltzer.  Pt. Is  pain-free for now.  Continue current medications.  For questions or updates, please contact [facility] Please consult [email or web address] for contact info under  Cardiology/STEMI.      Signed, Kristeen Miss, MD  05/21/2018, 8:42 AM

## 2018-05-21 NOTE — Progress Notes (Addendum)
ANTICOAGULATION CONSULT NOTE  Pharmacy Consult for Heparin + Warfarin Indication: mechanical AVR  Allergies  Allergen Reactions  . Contrast Media [Iodinated Diagnostic Agents] Hives, Itching and Swelling    Patient Measurements: Height: 5\' 6"  (167.6 cm) Weight: 166 lb 3.6 oz (75.4 kg) IBW/kg (Calculated) : 59.3 Heparin Dosing Weight: 72.8 kg  Vital Signs: Temp: 97.9 F (36.6 C) (08/20 0838) Temp Source: Oral (08/20 0838) BP: 131/92 (08/20 1623) Pulse Rate: 45 (08/20 1715)  Labs: Recent Labs    05/19/18 0159  05/19/18 2306 05/21/18 0049 05/21/18 0814 05/21/18 1218  HGB 11.7*  --  12.6 11.1*  --   --   HCT 37.3  --  40.2 34.6*  --   --   PLT 178  --  184 159  --   --   LABPROT 17.3*  --  14.8 15.0  --   --   INR 1.43  --  1.17 1.19  --   --   HEPARINUNFRC 0.76*   < > 0.59 0.33 0.55  --   CREATININE  --   --   --   --   --  0.74   < > = values in this interval not displayed.    Estimated Creatinine Clearance: 72.7 mL/min (by C-G formula based on SCr of 0.74 mg/dL).   Medications:  . sodium chloride    . sodium chloride 10 mL/hr at 05/21/18 0800  . sodium chloride    . sodium chloride    . sodium chloride    . nitroGLYCERIN Stopped (05/20/18 1800)    Assessment: 66 yof presenting with ischemic chest pain. On warfarin PTA on mechanical AVR. PTA regimen listed as 5 mg daily except 2.5 mg on Mon/Thurs- pt reporting 2.5 mg on Tues/Thurs. Also on concurrent plavix.  Pharmacy consulted to manage heparin.  Heparin level therapeutic at 0.55 on heparin 800 units/hr, CVTS recommending repeat PCI. Pt now s/p IABP-supported PCI. Pharmacy consulted to resume warfarin tonight and IV heparin 8hr after IABP sheath pull (~2000).  Goal of Therapy:  Heparin level 0.3-0.7 units/ml Monitor platelets by anticoagulation protocol: Yes    Plan:  Warfarin 5mg  PO x1 tonight Resume IV heparin 800 units/hr with no bolus tomorrow morning at 0400 Check 6hr heparin level  ADDENDUM:  Sheath remains in place due to hematoma, will retime initiation of heparin pending sheath removal.  ADDENDUM x2: Sheath pulled ~2130, resume IV heparin 800 units/hr at 0530 tomorrow morning.  05/22/18, PharmD, BCPS Clinical Pharmacist [phone] Please check AMION for all [facility] Pharmacy numbers 05/21/2018

## 2018-05-21 NOTE — Interval H&P Note (Signed)
Cath Lab Visit (complete for each Cath Lab visit)  Clinical Evaluation Leading to the Procedure:   ACS: Yes.    Non-ACS:    Anginal Classification: CCS IV  Anti-ischemic medical therapy: Maximal Therapy (2 or more classes of medications)  Non-Invasive Test Results: No non-invasive testing performed  Prior CABG: Previous CABG   High risk PCI of the left main planned.  Risks and benefits of the procedure explained to the patient and she is willing to proceed.  Plan for IABP placement prior to PCI.  Graft to LAD and circ is compromised so left main is not fully protected.  She understands the risks.  Premed for dye allergy.   History and Physical Interval Note:  05/21/2018 1:44 PM  [name]  has presented today for surgery, with the diagnosis of Lt Main PCI  The various methods of treatment have been discussed with the patient and family. After consideration of risks, benefits and other options for treatment, the patient has consented to  Procedure(s): CORONARY STENT INTERVENTION (N/A) IABP INSERTION (N/A) as a surgical intervention .  The patient's history has been reviewed, patient examined, no change in status, stable for surgery.  I have reviewed the patient's chart and labs.  Questions were answered to the patient's satisfaction.     Lance Muss

## 2018-05-21 NOTE — Progress Notes (Signed)
Right femoral sheath removed. Pressure held for 30 minutes. Site level 0. Pressure dressing applied. Patient stable through sheath pull.   Bedrest begins at 2130.

## 2018-05-21 NOTE — Progress Notes (Signed)
ANTICOAGULATION CONSULT NOTE  Pharmacy Consult for Heparin Indication: mechanical AVR  Allergies  Allergen Reactions  . Contrast Media [Iodinated Diagnostic Agents] Hives, Itching and Swelling    Patient Measurements: Height: 5\' 6"  (167.6 cm) Weight: 166 lb 3.6 oz (75.4 kg) IBW/kg (Calculated) : 59.3 Heparin Dosing Weight: 72.8 kg  Vital Signs: Temp: 97.9 F (36.6 C) (08/20 0838) Temp Source: Oral (08/20 0838) BP: 137/57 (08/20 0800) Pulse Rate: 144 (08/20 0800)  Labs: Recent Labs    05/19/18 0159  05/19/18 2306 05/21/18 0049 05/21/18 0814  HGB 11.7*  --  12.6 11.1*  --   HCT 37.3  --  40.2 34.6*  --   PLT 178  --  184 159  --   LABPROT 17.3*  --  14.8 15.0  --   INR 1.43  --  1.17 1.19  --   HEPARINUNFRC 0.76*   < > 0.59 0.33 0.55   < > = values in this interval not displayed.    Estimated Creatinine Clearance: 66.1 mL/min (by C-G formula based on SCr of 0.88 mg/dL).   Medications:  . sodium chloride    . sodium chloride 10 mL/hr at 05/21/18 0800  . heparin 800 Units/hr (05/21/18 0800)  . nitroGLYCERIN Stopped (05/20/18 1800)    Assessment: 66 yof presenting with ischemic chest pain. On warfarin PTA on mechanical AVR. PTA regimen listed as 5 mg daily except 2.5 mg on Mon/Thurs- pt reporting 2.5 mg on Tues/Thurs. Also on concurrent plavix.  Pharmacy consulted to manage heparin.  Heparin level therapeutic at 0.55 on heparin 800 units/hr, CVTS recommending repeat PCI. H/H relatively stable. No signs or symptoms of bleeding or issues with infusion reported by nursing.  Goal of Therapy:  Heparin level 0.3-0.7 units/ml Monitor platelets by anticoagulation protocol: Yes    Plan:  Continue heparin infusion at 800 units/hr Check anti-Xa level daily while on heparin Continue to monitor H&H and platelets   05/22/18, PharmD PGY2 Cardiology Pharmacy Resident Phone [phone] 05/21/2018 9:04 AM

## 2018-05-21 NOTE — Progress Notes (Signed)
ANTICOAGULATION CONSULT NOTE  Pharmacy Consult for Heparin Indication: mechanical AVR  Allergies  Allergen Reactions  . Contrast Media [Iodinated Diagnostic Agents] Hives, Itching and Swelling    Patient Measurements: Height: 5\' 6"  (167.6 cm) Weight: 163 lb 4.8 oz (74.1 kg)(Scale A) IBW/kg (Calculated) : 59.3 Heparin Dosing Weight: 72.8 kg  Vital Signs: Temp: 98.1 F (36.7 C) (08/19 2000) Temp Source: Oral (08/19 2000) BP: 135/59 (08/20 0000) Pulse Rate: 47 (08/20 0000)  Labs: Recent Labs    05/18/18 0332  05/19/18 0159 05/19/18 1431 05/19/18 2306 05/21/18 0049  HGB 12.3  --  11.7*  --  12.6 11.1*  HCT 38.9  --  37.3  --  40.2 34.6*  PLT 163  --  178  --  184 159  LABPROT 20.4*  --  17.3*  --  14.8 15.0  INR 1.76  --  1.43  --  1.17 1.19  HEPARINUNFRC 0.76*   < > 0.76* 0.62 0.59 0.33  CREATININE 0.88  --   --   --   --   --    < > = values in this interval not displayed.    Estimated Creatinine Clearance: 65.6 mL/min (by C-G formula based on SCr of 0.88 mg/dL).   Medications:  . sodium chloride    . sodium chloride 10 mL/hr at 05/21/18 0000  . heparin 800 Units/hr (05/21/18 0000)  . nitroGLYCERIN Stopped (05/20/18 1800)    Assessment: 66 yof presenting with ischemic chest pain. On warfarin PTA on mechanical AVR. PTA regimen listed as 5 mg daily except 2.5 mg on Mon/Thurs- pt reporting 2.5 mg on Tues/Thurs. Also on concurrent plavix.  Pharmacy consulted to manage heparin.  8/20 AM update: heparin level therapeutic x 1 after re-start s/p cath, CVTS recommending repeat PCI  Goal of Therapy:  Heparin level 0.3-0.7 units/ml Monitor platelets by anticoagulation protocol: Yes    Plan:  Cont heparin at 800 units/hr 0900 confirmatory heparin level  9/20, PharmD, BCPS Clinical Pharmacist Phone: [phone]

## 2018-05-21 NOTE — Progress Notes (Signed)
Left groin hematoma forming at 20:04, manual pressure reapplied for additional 20 minutes. Groin level back to 0. Left dp and pt pulses palpable.

## 2018-05-21 NOTE — Progress Notes (Addendum)
IABP aspirated and removed from left femoral artery. Manual pressure applied for 30 minutes. Groin level 0, no S+S of hematoma. Tegaderm dressing applied, bedrest instructions given.   Left dp and pt pulses easily palpable.  Bedrest begins at 19:50:00

## 2018-05-21 NOTE — H&P (View-Only) (Signed)
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  She has had 2 previous surgeries. Had stenting of the saphenous vein graft to the LAD and circumflex artery.  The stent has stenosed.  Cath yesterday revealed a tight stenosis in the ostium of the graft.  Dr. Eldridge Dace was not able to fully engage the catheter so PCI was not performed.   Inpatient Medications    Scheduled Meds: . aspirin EC  81 mg Oral Daily  . atorvastatin  80 mg Oral q1800  . clopidogrel  75 mg Oral Daily  . isosorbide mononitrate  30 mg Oral Daily  . lisinopril  10 mg Oral Daily  . multivitamin with minerals  1 tablet Oral Daily  . pantoprazole  40 mg Oral Daily  . sodium chloride flush  3 mL Intravenous Q12H  . sodium chloride flush  3 mL Intravenous Q12H   Continuous Infusions: . sodium chloride    . sodium chloride 10 mL/hr at 05/21/18 0800  . heparin 800 Units/hr (05/21/18 0800)  . nitroGLYCERIN Stopped (05/20/18 1800)   PRN Meds: sodium chloride, sodium chloride, acetaminophen, nitroGLYCERIN, ondansetron (ZOFRAN) IV, polyvinyl alcohol, sodium chloride flush, sodium chloride flush, zolpidem   Vital Signs    Vitals:   05/21/18 0600 05/21/18 0700 05/21/18 0800 05/21/18 0838  BP: 139/65 (!) 136/46 (!) 137/57   Pulse: (!) 41 85 (!) 144   Resp: 14 16 19    Temp:    97.9 F (36.6 C)  TempSrc:   Oral Oral  SpO2: 100% 99% 97%   Weight: 75.4 kg     Height:        Intake/Output Summary (Last 24 hours) at 05/21/2018 0842 Last data filed at 05/21/2018 0800 Gross per 24 hour  Intake 1119.79 ml  Output -  Net 1119.79 ml   Filed Weights   05/18/18 0407 05/20/18 0604 05/21/18 0600  Weight: 73.3 kg 74.1 kg 75.4 kg    Telemetry    NSR  - Personally Reviewed  ECG     NSR  - Personally Reviewed  Physical Exam   GEN: No acute  distress.  Pain free.  Neck: No JVD Cardiac: RRR, no murmurs, rubs, or gallops.  Respiratory: Clear to auscultation bilaterally. GI: Soft, nontender, non-distended  MS: No edema; No deformity.,  Right fem cath site looks good  Neuro:  Nonfocal  Psych: Normal affect   Labs    Chemistry Recent Labs  Lab 05/16/18 0907 05/17/18 1114 05/18/18 0332  NA 147* 144 146*  K 3.5 3.5 3.5  CL 117* 113* 112*  CO2 25 23 27   GLUCOSE 89 79 120*  BUN 12 10 14   CREATININE 0.99 0.91 0.88  CALCIUM 9.3 9.2 9.2  PROT  --  6.0*  --   ALBUMIN  --  3.5  --   AST  --  29  --   ALT  --  31  --   ALKPHOS  --  67  --   BILITOT  --  1.5*  --   GFRNONAA 59* >60 >60  GFRAA >60 >60 >60  ANIONGAP 5 8 7      Hematology Recent Labs  Lab 05/19/18 0159 05/19/18 2306 05/21/18 0049  WBC 5.2 4.9 15.6*  RBC 4.55 4.90 4.23  HGB 11.7* 12.6 11.1*  HCT 37.3 40.2 34.6*  MCV 82.0  82.0 81.8  MCH 25.7* 25.7* 26.2  MCHC 31.4 31.3 32.1  RDW 16.2* 16.0* 16.3*  PLT 178 184 159    Cardiac Enzymes Recent Labs  Lab 05/16/18 1230 05/17/18 1114  TROPONINI 0.11* <0.03    Recent Labs  Lab 05/16/18 0911  TROPIPOC 0.01     BNPNo results for input(s): BNP, PROBNP in the last 168 hours.   DDimer No results for input(s): DDIMER in the last 168 hours.   Radiology    No results found.  Cardiac Studies      Patient Profile     66 y.o. female with CAD and AVR / Bentall procedure   Assessment & Plan    1.  Coronary artery disease: The patient has restenosis of the stent in the ostium of the saphenous vein graft to the LAD and left circumflex artery. Dr. Varanasi  was not able to fully engage this graft so no PCI was done. He is considering PCI of the native left main.  He will be discussing the case with Dr. Cooper.  Pt. Is  pain-free for now.  Continue current medications.  For questions or updates, please contact [facility] Please consult [email or web address] for contact info under  Cardiology/STEMI.      Signed, Aribella Vavra, MD  05/21/2018, 8:42 AM    

## 2018-05-22 ENCOUNTER — Encounter (HOSPITAL_COMMUNITY): Payer: Self-pay | Admitting: Interventional Cardiology

## 2018-05-22 ENCOUNTER — Inpatient Hospital Stay (HOSPITAL_COMMUNITY): Payer: Commercial Managed Care - PPO

## 2018-05-22 DIAGNOSIS — I340 Nonrheumatic mitral (valve) insufficiency: Secondary | ICD-10-CM

## 2018-05-22 DIAGNOSIS — Z7901 Long term (current) use of anticoagulants: Secondary | ICD-10-CM

## 2018-05-22 LAB — BASIC METABOLIC PANEL
ANION GAP: 6 (ref 5–15)
BUN: 14 mg/dL (ref 8–23)
CALCIUM: 8.8 mg/dL — AB (ref 8.9–10.3)
CO2: 25 mmol/L (ref 22–32)
Chloride: 113 mmol/L — ABNORMAL HIGH (ref 98–111)
Creatinine, Ser: 0.87 mg/dL (ref 0.44–1.00)
GLUCOSE: 213 mg/dL — AB (ref 70–99)
POTASSIUM: 4.2 mmol/L (ref 3.5–5.1)
Sodium: 144 mmol/L (ref 135–145)

## 2018-05-22 LAB — ECHOCARDIOGRAM COMPLETE
Height: 66 in
WEIGHTICAEL: 2659.63 [oz_av]

## 2018-05-22 LAB — PROTIME-INR
INR: 1.21
PROTHROMBIN TIME: 15.2 s (ref 11.4–15.2)

## 2018-05-22 LAB — CBC
HEMATOCRIT: 35.5 % — AB (ref 36.0–46.0)
HEMOGLOBIN: 11.2 g/dL — AB (ref 12.0–15.0)
MCH: 25.7 pg — AB (ref 26.0–34.0)
MCHC: 31.5 g/dL (ref 30.0–36.0)
MCV: 81.6 fL (ref 78.0–100.0)
Platelets: 165 10*3/uL (ref 150–400)
RBC: 4.35 MIL/uL (ref 3.87–5.11)
RDW: 16.6 % — ABNORMAL HIGH (ref 11.5–15.5)
WBC: 11.3 10*3/uL — ABNORMAL HIGH (ref 4.0–10.5)

## 2018-05-22 LAB — TYPE AND SCREEN
ABO/RH(D): O POS
Antibody Screen: NEGATIVE

## 2018-05-22 LAB — HEPARIN LEVEL (UNFRACTIONATED): HEPARIN UNFRACTIONATED: 0.36 [IU]/mL (ref 0.30–0.70)

## 2018-05-22 MED ORDER — LISINOPRIL 2.5 MG PO TABS
2.5000 mg | ORAL_TABLET | Freq: Every day | ORAL | Status: DC
  Administered 2018-05-23 – 2018-05-24 (×2): 2.5 mg via ORAL
  Filled 2018-05-22 (×2): qty 1

## 2018-05-22 MED ORDER — WARFARIN SODIUM 5 MG PO TABS
5.0000 mg | ORAL_TABLET | Freq: Once | ORAL | Status: AC
  Administered 2018-05-22: 5 mg via ORAL
  Filled 2018-05-22: qty 1

## 2018-05-22 MED ORDER — ASPIRIN 81 MG PO CHEW
81.0000 mg | CHEWABLE_TABLET | Freq: Every day | ORAL | Status: DC
  Administered 2018-05-23 – 2018-05-24 (×2): 81 mg via ORAL
  Filled 2018-05-22 (×2): qty 1

## 2018-05-22 MED ORDER — SODIUM CHLORIDE 0.9 % IV BOLUS
500.0000 mL | Freq: Once | INTRAVENOUS | Status: AC
  Administered 2018-05-22: 500 mL via INTRAVENOUS

## 2018-05-22 MED ORDER — SODIUM CHLORIDE 0.9 % IV BOLUS
250.0000 mL | Freq: Once | INTRAVENOUS | Status: AC
  Administered 2018-05-22: 250 mL via INTRAVENOUS

## 2018-05-22 MED ORDER — SODIUM CHLORIDE 0.9 % IV BOLUS
250.0000 mL | Freq: Two times a day (BID) | INTRAVENOUS | Status: DC | PRN
  Administered 2018-05-22: 250 mL via INTRAVENOUS

## 2018-05-22 MED FILL — Nitroglycerin IV Soln 100 MCG/ML in D5W: INTRA_ARTERIAL | Qty: 10 | Status: AC

## 2018-05-22 NOTE — Progress Notes (Signed)
  Echocardiogram 2D Echocardiogram has been performed.  Adeel Guiffre G Brenley Priore 05/22/2018, 11:42 AM

## 2018-05-22 NOTE — Progress Notes (Addendum)
ANTICOAGULATION CONSULT NOTE  Pharmacy Consult for Heparin + Warfarin Indication: mechanical AVR  Allergies  Allergen Reactions  . Contrast Media [Iodinated Diagnostic Agents] Hives, Itching and Swelling    Patient Measurements: Height: 5\' 6"  (167.6 cm) Weight: 166 lb 3.6 oz (75.4 kg) IBW/kg (Calculated) : 59.3 Heparin Dosing Weight: 72.8 kg  Vital Signs: Temp: 98.3 F (36.8 C) (08/21 1205) Temp Source: Oral (08/21 1205) BP: 126/109 (08/21 1200) Pulse Rate: 54 (08/21 1300)  Labs: Recent Labs    05/19/18 2306 05/21/18 0049 05/21/18 0814 05/21/18 1218 05/22/18 0053  HGB 12.6 11.1*  --   --  11.2*  HCT 40.2 34.6*  --   --  35.5*  PLT 184 159  --   --  165  LABPROT 14.8 15.0  --   --  15.2  INR 1.17 1.19  --   --  1.21  HEPARINUNFRC 0.59 0.33 0.55  --   --   CREATININE  --   --   --  0.74 0.87    Estimated Creatinine Clearance: 66.9 mL/min (by C-G formula based on SCr of 0.87 mg/dL).   Medications:  . sodium chloride 10 mL/hr at 05/21/18 0800  . sodium chloride    . heparin 800 Units/hr (05/22/18 1100)  . nitroGLYCERIN Stopped (05/20/18 1800)    Assessment: 66 yof presenting with ischemic chest pain. On warfarin PTA on mechanical AVR. PTA regimen listed as 5 mg daily except 2.5 mg on Mon/Thurs- pt reporting 2.5 mg on Tues/Thurs. Also on concurrent plavix.  Pharmacy consulted to manage heparin and warfarin. INR 1.21, will continue heparin until INR > 2.  Awaiting heparin level, pt now s/p IABP-supported PCI. INR 1.21, H/H stable.  Goal of Therapy:  Heparin level 0.3-0.7 units/ml Monitor platelets by anticoagulation protocol: Yes    Plan:  Warfarin 5mg  PO x1 tonight Plans for heparin to come  ADDENDUM: Heparin level therapeutic, continue 800 units/hr and recheck with am labs.  05/22/18, PharmD, BCPS Clinical Pharmacist [phone] Please check AMION for all [facility] Pharmacy numbers 05/22/2018

## 2018-05-22 NOTE — Progress Notes (Signed)
Called by RN for systolic BP in 80s,  I ordered 250 cc NS and decreased lisinopril to 2.5 mg with parameters.  She has already had the 10 mg today.  Discussed with Dr. Elease Hashimoto and cancelled transfer to tele.

## 2018-05-22 NOTE — Progress Notes (Signed)
CARDIAC REHAB PHASE I   PRE:  Rate/Rhythm: 71 SR with PACs and PVCs    BP: sitting 116/55    SaO2: 100 RA  MODE:  Ambulation: 370 ft   POST:  Rate/Rhythm: 85 SR with PACs    BP: sitting 136/65     SaO2: 100 RA  Pt ambulated around unit but unfortunately had to stop to rest x3 due to SOB and chest tightness. Sts it is not as bad as admit but did cause her to pause and slow down (wasn't going fast). After sitting and resting in room, chest tightness down to 2/10. Resolved with time. Discussed ed with pt, she is receptive. Will refer to [facility] CRPII although she is unsure at this time if she will do it. Will f/u with exercise guidelines later. Notified RN of chest tightness. [phone]   Harriet Masson CES, ACSM 05/22/2018 10:51 AM

## 2018-05-22 NOTE — Progress Notes (Signed)
Progress Note  Patient Name: [name] Date of Encounter: 05/22/2018  Primary Cardiologist: Chrystie Nose, MD   Subjective   67 year old female with a history of coronary artery disease and Bentall procedure.  She had PCI of her left main yesterday.  She is done well.  She remains on heparin for her mechanical aortic valve.  Inpatient Medications    Scheduled Meds: . aspirin EC  81 mg Oral Daily  . atorvastatin  80 mg Oral q1800  . clopidogrel  75 mg Oral Daily  . clopidogrel  75 mg Oral Q breakfast  . isosorbide mononitrate  30 mg Oral Daily  . lisinopril  10 mg Oral Daily  . multivitamin with minerals  1 tablet Oral Daily  . pantoprazole  40 mg Oral Daily  . sodium chloride flush  3 mL Intravenous Q12H  . Warfarin - Pharmacist Dosing Inpatient   Does not apply q1800   Continuous Infusions: . sodium chloride 10 mL/hr at 05/21/18 0800  . sodium chloride    . heparin 800 Units/hr (05/22/18 0800)  . nitroGLYCERIN Stopped (05/20/18 1800)   PRN Meds: sodium chloride, sodium chloride, acetaminophen, nitroGLYCERIN, ondansetron (ZOFRAN) IV, polyvinyl alcohol, sodium chloride flush, zolpidem   Vital Signs    Vitals:   05/22/18 0600 05/22/18 0700 05/22/18 0800 05/22/18 0828  BP: (!) 137/53 (!) 96/46 (!) 131/52   Pulse: (!) 43 (!) 49 60   Resp: 14 17 13    Temp:    98 F (36.7 C)  TempSrc:    Oral  SpO2: 100% 100% 100%   Weight:      Height:        Intake/Output Summary (Last 24 hours) at 05/22/2018 0900 Last data filed at 05/22/2018 0800 Gross per 24 hour  Intake 1428.63 ml  Output 1050 ml  Net 378.63 ml   Filed Weights   05/18/18 0407 05/20/18 0604 05/21/18 0600  Weight: 73.3 kg 74.1 kg 75.4 kg    Telemetry     NSR  - Personally Reviewed  ECG     NSR  - Personally Reviewed  Physical Exam   GEN: No acute distress.   Neck: No JVD Cardiac:  RR , mechanical S2.   Respiratory: Clear to auscultation bilaterally. GI: Soft, nontender, non-distended    MS: No edema; No deformity. Neuro:  Nonfocal  Psych: Normal affect   Labs    Chemistry Recent Labs  Lab 05/17/18 1114 05/18/18 0332 05/21/18 1218 05/22/18 0053  NA 144 146* 143 144  K 3.5 3.5 3.0* 4.2  CL 113* 112* 115* 113*  CO2 23 27 24 25   GLUCOSE 79 120* 95 213*  BUN 10 14 13 14   CREATININE 0.91 0.88 0.74 0.87  CALCIUM 9.2 9.2 8.1* 8.8*  PROT 6.0*  --   --   --   ALBUMIN 3.5  --   --   --   AST 29  --   --   --   ALT 31  --   --   --   ALKPHOS 67  --   --   --   BILITOT 1.5*  --   --   --   GFRNONAA >60 >60 >60 >60  GFRAA >60 >60 >60 >60  ANIONGAP 8 7 4* 6     Hematology Recent Labs  Lab 05/19/18 2306 05/21/18 0049 05/22/18 0053  WBC 4.9 15.6* 11.3*  RBC 4.90 4.23 4.35  HGB 12.6 11.1* 11.2*  HCT 40.2 34.6* 35.5*  MCV  82.0 81.8 81.6  MCH 25.7* 26.2 25.7*  MCHC 31.3 32.1 31.5  RDW 16.0* 16.3* 16.6*  PLT 184 159 165    Cardiac Enzymes Recent Labs  Lab 05/16/18 1230 05/17/18 1114  TROPONINI 0.11* <0.03    Recent Labs  Lab 05/16/18 0911  TROPIPOC 0.01     BNPNo results for input(s): BNP, PROBNP in the last 168 hours.   DDimer No results for input(s): DDIMER in the last 168 hours.   Radiology    No results found.  Cardiac Studies     Patient Profile     67 y.o. female with aortic valve replacement ( Bentall procedure) and coronary artery disease.  Assessment & Plan    1.  Coronary artery disease: Patient status post stenting of her left main.  She seems to be doing well.  She is not had any further episodes of chest pain.  We will transfer out to telemetry.  We will transition to warfarin. I would prefer that we continue heparin drip until the INR is greater than 2.  The plan will be for warfarin, aspirin, Plavix for 1 month and then stopping the aspirin.  For questions or updates, please contact [facility] Please consult [email or web address] for contact info under Cardiology/STEMI.      Signed, Kristeen Miss, MD  05/22/2018,  9:00 AM

## 2018-05-22 NOTE — Progress Notes (Signed)
Chakravartti, MD paged regarding patient low blood pressures and no parameters. Verbal orders to keep MAP >60 and give prn NS bolus of 250cc over 30 minutes no more than twice during this shift.

## 2018-05-22 NOTE — Progress Notes (Signed)
Patient blood pressures systolic below 90 and MAP below 65. Cards Fellow, Franz Dell, MD called and notified. Verbal orders to give 500cc NS fluid bolus and keep systolic blood pressure above 90.

## 2018-05-23 LAB — CBC
HEMATOCRIT: 32.4 % — AB (ref 36.0–46.0)
Hemoglobin: 10.1 g/dL — ABNORMAL LOW (ref 12.0–15.0)
MCH: 25.7 pg — AB (ref 26.0–34.0)
MCHC: 31.2 g/dL (ref 30.0–36.0)
MCV: 82.4 fL (ref 78.0–100.0)
PLATELETS: 152 10*3/uL (ref 150–400)
RBC: 3.93 MIL/uL (ref 3.87–5.11)
RDW: 16.7 % — AB (ref 11.5–15.5)
WBC: 10.3 10*3/uL (ref 4.0–10.5)

## 2018-05-23 LAB — MRSA PCR SCREENING: MRSA by PCR: NEGATIVE

## 2018-05-23 LAB — PROTIME-INR
INR: 1.41
Prothrombin Time: 17.1 seconds — ABNORMAL HIGH (ref 11.4–15.2)

## 2018-05-23 LAB — HEPARIN LEVEL (UNFRACTIONATED): Heparin Unfractionated: 0.44 IU/mL (ref 0.30–0.70)

## 2018-05-23 MED ORDER — WARFARIN SODIUM 3 MG PO TABS
3.0000 mg | ORAL_TABLET | Freq: Once | ORAL | Status: AC
  Administered 2018-05-23: 3 mg via ORAL
  Filled 2018-05-23: qty 1

## 2018-05-23 NOTE — Progress Notes (Signed)
ANTICOAGULATION CONSULT NOTE  Pharmacy Consult for Heparin + Warfarin Indication: mechanical AVR  Allergies  Allergen Reactions  . Contrast Media [Iodinated Diagnostic Agents] Hives, Itching and Swelling    Patient Measurements: Height: 5\' 6"  (167.6 cm) Weight: 166 lb 3.6 oz (75.4 kg) IBW/kg (Calculated) : 59.3 Heparin Dosing Weight: 72.8 kg  Vital Signs: Temp: 98 F (36.7 C) (08/22 0735) Temp Source: Oral (08/22 0735) BP: 123/56 (08/22 0800) Pulse Rate: 60 (08/22 0800)  Labs: Recent Labs    05/21/18 0049 05/21/18 0814 05/21/18 1218 05/22/18 0053 05/22/18 1513 05/23/18 0306  HGB 11.1*  --   --  11.2*  --  10.1*  HCT 34.6*  --   --  35.5*  --  32.4*  PLT 159  --   --  165  --  152  LABPROT 15.0  --   --  15.2  --  17.1*  INR 1.19  --   --  1.21  --  1.41  HEPARINUNFRC 0.33 0.55  --   --  0.36 0.44  CREATININE  --   --  0.74 0.87  --   --     Estimated Creatinine Clearance: 66.9 mL/min (by C-G formula based on SCr of 0.87 mg/dL).   Medications:  . sodium chloride 10 mL/hr at 05/21/18 0800  . sodium chloride    . heparin 800 Units/hr (05/23/18 0700)  . nitroGLYCERIN Stopped (05/20/18 1800)  . sodium chloride Stopped (05/22/18 2342)    Assessment: 66 yof presenting with ischemic chest pain. On warfarin PTA on mechanical AVR. PTA regimen listed as 5 mg daily except 2.5 mg on Mon/Thurs- pt reporting 2.5 mg on Tues/Thurs. Also on concurrent plavix.  Pharmacy consulted to manage heparin and warfarin. INR 1.41, will continue heparin until INR > 2.  pt now s/p IABP-supported PCI. INR remains subtherapeutic at 1.41 after restarting warfarin for 2 doses- anticipate seeing full warfarin effects today into tomorrow, H/H stable. HL is currently therapeutic at 0.44. Slight bruising from femoral sheath removal, no signs of bleeding per RN.  Goal of Therapy:  Heparin level 0.3-0.7 units/ml Monitor platelets by anticoagulation protocol: Yes    Plan:  Warfarin 3 mg PO x1  tonight Continue heparin at 800 units/hr   05/24/18, PharmD Candidate 05/23/2018

## 2018-05-23 NOTE — Progress Notes (Signed)
Pt just back from walking with RN. No CP today, felt well. Discussed walking at home and reviewed other teaching. Pt is somewhat resistant to change or habits outside of her comfort zone. Reinforced the right way to carry NTG. She is not very interested in CRPII but encouraged her to think about it. She sts she wants to go back to work after one week. [phone] Ethelda Chick CES, ACSM 11:29 AM 05/23/2018

## 2018-05-23 NOTE — Progress Notes (Signed)
Progress Note  Patient Name: [name] Date of Encounter: 05/23/2018  Primary Cardiologist: Chrystie Nose, MD   Subjective   67 year old female with a history of Bentall procedure and status post stenting.  She has a stent in the saphenous vein graft that leads to her left anterior descending artery and left circumflex artery.  The stent extends out into the aorta.  PCI was attempted on this graft but Dr. Eldridge Dace was not able to engage the graft because of the stent location. She is now status post stenting of her left main procedure.  She tolerated the procedure fairly well.  She did have some hypotension with a systolic blood pressure in the 60s yesterday.  This responded to IV normal saline.  The patient did not have any further complications.  She is pain-free this morning.  Inpatient Medications    Scheduled Meds: . aspirin  81 mg Oral Daily  . atorvastatin  80 mg Oral q1800  . clopidogrel  75 mg Oral Q breakfast  . lisinopril  2.5 mg Oral Daily  . multivitamin with minerals  1 tablet Oral Daily  . pantoprazole  40 mg Oral Daily  . sodium chloride flush  3 mL Intravenous Q12H  . Warfarin - Pharmacist Dosing Inpatient   Does not apply q1800   Continuous Infusions: . sodium chloride 10 mL/hr at 05/21/18 0800  . sodium chloride    . heparin 800 Units/hr (05/23/18 0700)  . nitroGLYCERIN Stopped (05/20/18 1800)  . sodium chloride Stopped (05/22/18 2342)   PRN Meds: sodium chloride, sodium chloride, acetaminophen, nitroGLYCERIN, ondansetron (ZOFRAN) IV, polyvinyl alcohol, sodium chloride, sodium chloride flush, zolpidem   Vital Signs    Vitals:   05/23/18 0600 05/23/18 0700 05/23/18 0735 05/23/18 0800  BP: (!) 127/54 (!) 133/48  (!) 123/56  Pulse: (!) 45 (!) 59  60  Resp: 14 20  17   Temp:   98 F (36.7 C)   TempSrc:   Oral   SpO2: 100% 100%  99%  Weight:      Height:        Intake/Output Summary (Last 24 hours) at 05/23/2018 0931 Last data filed at 05/23/2018  0800 Gross per 24 hour  Intake 675.08 ml  Output -  Net 675.08 ml   Filed Weights   05/18/18 0407 05/20/18 0604 05/21/18 0600  Weight: 73.3 kg 74.1 kg 75.4 kg    Telemetry     NSR - Personally Reviewed  ECG     NSR  - Personally Reviewed  Physical Exam   GEN:  Middle-aged female, no acute distress Neck: No JVD Cardiac: RRR, mechanical S2,  2-3/6 systolic murmur.  Respiratory: Clear to auscultation bilaterally. GI: Soft, nontender, non-distended  MS: No edema; No deformity.  Cath site looks good.  Left radial pulse is not as brisk as right radial pulse  Neuro:  Nonfocal  Psych: Normal affect   Labs    Chemistry Recent Labs  Lab 05/17/18 1114 05/18/18 0332 05/21/18 1218 05/22/18 0053  NA 144 146* 143 144  K 3.5 3.5 3.0* 4.2  CL 113* 112* 115* 113*  CO2 23 27 24 25   GLUCOSE 79 120* 95 213*  BUN 10 14 13 14   CREATININE 0.91 0.88 0.74 0.87  CALCIUM 9.2 9.2 8.1* 8.8*  PROT 6.0*  --   --   --   ALBUMIN 3.5  --   --   --   AST 29  --   --   --  ALT 31  --   --   --   ALKPHOS 67  --   --   --   BILITOT 1.5*  --   --   --   GFRNONAA >60 >60 >60 >60  GFRAA >60 >60 >60 >60  ANIONGAP 8 7 4* 6     Hematology Recent Labs  Lab 05/21/18 0049 05/22/18 0053 05/23/18 0306  WBC 15.6* 11.3* 10.3  RBC 4.23 4.35 3.93  HGB 11.1* 11.2* 10.1*  HCT 34.6* 35.5* 32.4*  MCV 81.8 81.6 82.4  MCH 26.2 25.7* 25.7*  MCHC 32.1 31.5 31.2  RDW 16.3* 16.6* 16.7*  PLT 159 165 152    Cardiac Enzymes Recent Labs  Lab 05/16/18 1230 05/17/18 1114  TROPONINI 0.11* <0.03   No results for input(s): TROPIPOC in the last 168 hours.   BNPNo results for input(s): BNP, PROBNP in the last 168 hours.   DDimer No results for input(s): DDIMER in the last 168 hours.   Radiology    No results found.  Cardiac Studies     Patient Profile     67 y.o. female status post Bentall procedure and now past status post stenting of her native left main.  Assessment & Plan    1.   Coronary artery disease: She is status post stenting of her left main.  She seems to be doing better today.  We will transfer her out to telemetry.  Back on warfarin.  The plan is for her to take warfarin, Plavix and aspirin for 1 month.  At that point we will stop the aspirin.  She will continue warfarin and Plavix long-term.  2.  Aortic valve replacement: She has severe aortic stenosis by echo.  She is not a candidate for redo surgery.  Continue anticoagulation.  3.  Blood pressure discrepancy in her arms: Her left radial pulse is not as brisk as her right radial pulse.  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  She is not having any ischemic signs or symptoms .  It was just noted that her blood pressure measured lower in the left arm.  This can be followed up as an outpatient.  For questions or updates, please contact [facility] Please consult [email or web address] for contact info under Cardiology/STEMI.      Signed, Kristeen Miss, MD  05/23/2018, 9:31 AM

## 2018-05-24 DIAGNOSIS — Z952 Presence of prosthetic heart valve: Secondary | ICD-10-CM

## 2018-05-24 LAB — BASIC METABOLIC PANEL
ANION GAP: 6 (ref 5–15)
BUN: 13 mg/dL (ref 8–23)
CO2: 26 mmol/L (ref 22–32)
Calcium: 9.2 mg/dL (ref 8.9–10.3)
Chloride: 110 mmol/L (ref 98–111)
Creatinine, Ser: 0.88 mg/dL (ref 0.44–1.00)
GFR calc non Af Amer: >60 mL/min (ref >60)
Glucose, Bld: 99 mg/dL (ref 70–99)
POTASSIUM: 3.6 mmol/L (ref 3.5–5.1)
Sodium: 142 mmol/L (ref 135–145)

## 2018-05-24 LAB — CBC
HCT: 33.7 % — ABNORMAL LOW (ref 36.0–46.0)
Hemoglobin: 10.7 g/dL — ABNORMAL LOW (ref 12.0–15.0)
MCH: 26.1 pg (ref 26.0–34.0)
MCHC: 31.8 g/dL (ref 30.0–36.0)
MCV: 82.2 fL (ref 78.0–100.0)
PLATELETS: 147 10*3/uL — AB (ref 150–400)
RBC: 4.1 MIL/uL (ref 3.87–5.11)
RDW: 16.8 % — AB (ref 11.5–15.5)
WBC: 7.9 10*3/uL (ref 4.0–10.5)

## 2018-05-24 LAB — HEPARIN LEVEL (UNFRACTIONATED): Heparin Unfractionated: 0.52 IU/mL (ref 0.30–0.70)

## 2018-05-24 LAB — PROTIME-INR
INR: 1.43
Prothrombin Time: 17.4 seconds — ABNORMAL HIGH (ref 11.4–15.2)

## 2018-05-24 LAB — HEMOGLOBIN A1C
Hgb A1c MFr Bld: 6 % — ABNORMAL HIGH (ref 4.8–5.6)
Mean Plasma Glucose: 125.5 mg/dL

## 2018-05-24 MED ORDER — LISINOPRIL 2.5 MG PO TABS: 2.5000 mg | ORAL_TABLET | Freq: Every day | ORAL | 4 refills | Status: DC

## 2018-05-24 MED ORDER — WARFARIN SODIUM 5 MG PO TABS: 5.0000 mg | ORAL_TABLET | Freq: Once | ORAL | Status: DC

## 2018-05-24 MED ORDER — ENOXAPARIN SODIUM 80 MG/0.8ML ~~LOC~~ SOLN: 80.0000 mg | Freq: Two times a day (BID) | SUBCUTANEOUS | 0 refills | Status: DC

## 2018-05-24 MED ORDER — ENOXAPARIN SODIUM 80 MG/0.8ML ~~LOC~~ SOLN
80.0000 mg | Freq: Two times a day (BID) | SUBCUTANEOUS | Status: DC
  Administered 2018-05-24: 80 mg via SUBCUTANEOUS
  Filled 2018-05-24: qty 0.8

## 2018-05-24 MED FILL — ENOXAPARIN 80 MG/0.8 ML SYR: 80 | 4 days supply | Qty: 6 | Fill #0

## 2018-05-24 NOTE — Progress Notes (Signed)
CARDIAC REHAB PHASE I   PRE:  Rate/Rhythm: 66 SR    BP: sitting 113/44    SaO2:   MODE:  Ambulation: 620 ft   POST:  Rate/Rhythm: 91 SR    BP: sitting 136/41     SaO2:   Tolerated well. Only c/o is groin soreness. Denied CP. She is eager to go home. Encouraged increasing walking at home. [phone]   Harriet Masson CES, ACSM 05/24/2018 8:47 AM

## 2018-05-24 NOTE — Progress Notes (Signed)
ANTICOAGULATION CONSULT NOTE  Pharmacy Consult for Lovenox + Warfarin Indication: mechanical AVR  Allergies  Allergen Reactions  . Contrast Media [Iodinated Diagnostic Agents] Hives, Itching and Swelling    Patient Measurements: Height: 5\' 7"  (170.2 cm) Weight: 170 lb 10.2 oz (77.4 kg) IBW/kg (Calculated) : 61.6 Heparin Dosing Weight: 72.8 kg  Vital Signs: Temp: 97 F (36.1 C) (08/23 0751) Temp Source: Oral (08/23 0751) BP: 113/44 (08/23 0751) Pulse Rate: 60 (08/23 0751)  Labs: Recent Labs    05/21/18 1218  05/22/18 0053 05/22/18 1513 05/23/18 0306 05/24/18 0242  HGB  --    < > 11.2*  --  10.1* 10.7*  HCT  --   --  35.5*  --  32.4* 33.7*  PLT  --   --  165  --  152 147*  LABPROT  --   --  15.2  --  17.1* 17.4*  INR  --   --  1.21  --  1.41 1.43  HEPARINUNFRC  --   --   --  0.36 0.44 0.52  CREATININE 0.74  --  0.87  --   --  0.88   < > = values in this interval not displayed.    Estimated Creatinine Clearance: 68.3 mL/min (by C-G formula based on SCr of 0.88 mg/dL).   Medications:  . sodium chloride 10 mL/hr at 05/21/18 0800  . sodium chloride    . heparin 800 Units/hr (05/24/18 0600)  . nitroGLYCERIN Stopped (05/20/18 1800)  . sodium chloride Stopped (05/22/18 2342)    Assessment: 66 yof presenting with ischemic chest pain. On warfarin PTA on mechanical AVR. PTA regimen listed as 5 mg daily except 2.5 mg on Mon/Thurs- pt reporting 2.5 mg on Tues/Thurs. Also on concurrent plavix.  Pharmacy consulted to manage heparin and warfarin. INR 1.41, will continue heparin until INR > 2.  INR remains subtherapeutic at 1.43 after restarting warfarin for 3 doses. H/H stable. HL is currently therapeutic at 0.52. Slight bruising from femoral sheath removal, no signs of bleeding per RN.  New orders to transition patient to lovenox bridge until can be seen on Monday in Coumadin clinic.   Goal of Therapy:  Anti-Xa level 0.6-1.2 units/ml Monitor platelets by anticoagulation  protocol: Yes    Plan:  Would continue warfarin 5mg  daily until seen in coumadin clinic on Monday Start lovenox 80mg  q12 hours - will start at 10am - turn heparin off now  PharmD., BCPS Clinical Pharmacist 05/24/2018 9:08 AM

## 2018-05-24 NOTE — Discharge Summary (Addendum)
Discharge Summary    Patient ID: [name],  MRN: [id number], DOB/AGE: [birth year] 67 y.o.  Admit date: 05/17/2018 Discharge date: 05/24/2018  Primary Care Provider: [name] Primary Cardiologist: Chrystie Nose, MD  Discharge Diagnoses    Principal Problem:   NSTEMI (non-ST elevated myocardial infarction) [facility]) Active Problems:   S/P AVR (aortic valve replacement)   Chronic anticoagulation for mechanical AVR   Contrast media allergy   Essential hypertension   Hx of CABG   CAD in native artery  Allergies Allergies  Allergen Reactions  . Contrast Media [Iodinated Diagnostic Agents] Hives, Itching and Swelling    Diagnostic Studies/Procedures    Cardiac catheterization 05/20/2018:  Ost LM to LM lesion is 95% stenosed.  Ost LAD to Prox LAD lesion is 100% stenosed.  Ost 1st Mrg lesion is 90% stenosed.  Ost RCA to Prox RCA lesion is 100% stenosed. Not injected today.  SVG Y-graft to LAD and circumflex. Possible Origin lesion is 90% stenosed of the prior SVG stent from 2017.  Normal right heart pressures.  Ao sat 98%, PA sat 76%, CO 6.4 L/min; CI 3.5; mean PA 13 mm Hg; mean PCWP 8 mm Hg  Thoracic aortic tortuosity made catheter manipulation more difficult.  Chest pain at the conclusion of the procedure was relieved with IV NTG. Significant ST depressions when she had CP and increased BP . Will need close monitoring of BP.   Possible severe in-stent restenosis of 4.0 mm Synergy stent from 2017.  Severe native disease including left main.  There is a long segment of stent hanging outside of the graft, which makes it difficult to selectively engage the vessel and know if the portion of the stent inside the vessel is patent.  Prox LAD is occluded.  Fixing native left main would not help anterior wall.    If Graft stent has severe restenosis once, and restenting may not give a different longterm result.    Need to know LV function.  Check echo.  Would see  if she is a redo candidate as LIMA was not used the last surgery.    Will check options for revascularization.   Higher contrast load due to difficulty finding grafts  Recheck kidney function in AM.  Consider CT scan to see if we can better define patency of the stent.   Given her chest pain and ECG changes, I suspect there is significant disease in the stent causing her ischemia.   Cardiac catheterization 05/21/2018:  Ost LM lesion is 95% stenosed.  A drug-eluting stent was successfully placed using a STENT RESOLUTE ONYX 5.0X12.  Post intervention, there is a 0% residual stenosis.  IABP placed preprocedure for left main intervention.  Prox LAD lesion is 10% stenosed.    Recommend to resume Warfarin, at currently prescribed dose and frequency, on 05/21/18.  Recommend concurrent antiplatelet therapy of Aspirin 81mg  daily for 1 month and Clopidogrel 75mg  daily for indefinitely, as long as there are no bleeding problems. . _____________   History of Present Illness     [name] is a 67 y.o. female with history ofaortic insufficiency s/p St. 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 She was initially seen in the ER on 05/16/2018 for anginal-type chest pain and troponin 0.11. Admission was recommended but she declined as she had "things to take care of and bills to pay." She promised to return today.  She described what sounded like stable angina for many years but cannot really say when this started, possibly before she had PCI in 06/2016 without significant change. In 07/2016 she had a  nonischemic nuc which was her last ischemic assessment. Last echo 12/2016 showed EF 60-65%, normal diastolic parameters and normal AV function, mild MR, mild LAE.  During her visit 12/2016 her metoprolol was stopped due to possible 2nd degree heart block on EKG and Isosorbide was reduced.   She works at the [employer] and was carrying empty boxes when he developed chest pain similar to prior angina. She rested and when it did not go away she notified a Radio broadcast assistant. She felt sweaty but had been sweating beforehand anyway. No SOB, nausea, diaphoresis. She took 1 SL NTG which relieved the pain, but coworkers alerted EMS and the patient was transported to the hospital.   In the ED, initial trop negative but f/u value 0.11. Na 147, K 0.99, INR 1.93. CXR without acute disease. Tele showed bradycardia with PACs/PVCs for which she is relatively asymptomatic. Initial EKG did show subtle ST depression inferiorly and V5-V6. As above, she insisted on leaving AMA from the ER despite counseling and promised to return after she wrapped up loose ends. She was advised to remain NPO after midnight and hold Coumadin in prep for cath.  Upon arrival to [facility] on 05/17/2018, she was noted to be pain free but does report she had a little recurrent chest tightness at rest the prior evening.   Hospital Course    On 05/20/2018 patient was taken to the cardiac Cath Lab in which it was found she had restenosis of the stent of the ostium of the SVG to the LAD and the left circumflex artery.  Dr. Otilio Miu she was unable to fully engage this graft so no PCI was done at that time.  He is considering PCI of the native left main in which the case was discussed with Dr. Excell Seltzer.   On 05/21/2018 patient return to the Cath Lab for PCI for left main without complication.  She was continued on heparin secondary to mechanical aortic valve.  MD preferred continuation of heparin until INR was noted to be greater than 2.0.  Plans were made for continuation  of Coumadin, aspirin, Plavix for 1 month then stopping the aspirin.  She has been doing well since that time.  Is ambulated with cardiac rehabilitation without complication.  Had one episode of hypotension with BP in the 80s in which a 250 cc NS bolus was ordered and her lisinopril was decreased to 2.5 mg with parameters.  Left radial cath site unremarkable.  Plans are for Lovenox bridge with Lovenox 80 mg every 12 hours starting today 05/24/2018 with continuation of Coumadin 5 mg daily until seen in Coumadin clinic on Monday, 05/27/2018.  Recommendations per pharmacy.   Consultants: None   1. Aortic insufficiency with mechanical AVR 2000 on Coumadin with subtherapeutic INR yesterday-  -Plan for Lovenox bridge with 80 mg with Coumadin 5 mg until seen in Coumadin clinic on Monday, 05/27/2018 we will send 5-day supply of 80 mg Lovenox twice daily x5 days to current outpatient pharmacy to cover until she is seen in Coumadin clinic.  -INR on day of discharge  1.43   3. Ascending aortic aneurysm s/p repair 2014 with AAA as well -  -AAA scan 06/2016 showed stable dimensions of the supraceliac AAA of the proximal aorta measuring 3.2 cm x 3.6 cmwith recommended f/u 06/2017 which does not appear to have occurred.  -Last CTA 12/2014 showed stable changes of Bentall procedure, stable 4 cm aneurysmal dilatation of the distal ascending aorta and arch.  -Overdue for reassessment but no acute sx to suspect acute dissection.  4. Subclavian bypass history-   -Carotid duplex/subclavians 06/2016 were felt stable with repeat suggested 06/2018.  5. PACs/PVCs -  -No concern noted in the past, asymptomati.  6. Hypernatremia -  -Na 147 on admission, possibly related to fluid losses>> sodium on day of discharge noted to be 142  7. HTN - -BP was controlled this admission  8. Hyperlipidemia -  -Continue statin  She has been seen and examined by Dr. Elease Hashimoto who feels that she is stable ready for discharge on  05/24/2018. _____________  Discharge Vitals Blood pressure (!) 113/44, pulse 60, temperature (!) 97 F (36.1 C), temperature source Oral, resp. rate 14, height 5\' 7"  (1.702 m), weight 77.4 kg, SpO2 100 %.  Filed Weights   05/20/18 0604 05/21/18 0600 05/24/18 0204  Weight: 74.1 kg 75.4 kg 77.4 kg    Labs & Radiologic Studies    CBC Recent Labs    05/23/18 0306 05/24/18 0242  WBC 10.3 7.9  HGB 10.1* 10.7*  HCT 32.4* 33.7*  MCV 82.4 82.2  PLT 152 147*   Basic Metabolic Panel Recent Labs    81/15/72 0053 05/24/18 0242  NA 144 142  K 4.2 3.6  CL 113* 110  CO2 25 26  GLUCOSE 213* 99  BUN 14 13  CREATININE 0.87 0.88  CALCIUM 8.8* 9.2   Hemoglobin A1C Recent Labs    05/24/18 0243  HGBA1C 6.0*  _____________  Dg Chest 2 View  Result Date: 05/16/2018 CLINICAL DATA:  Chest pain. EXAM: CHEST - 2 VIEW COMPARISON:  Radiographs of November 19, 2017. FINDINGS: Stable cardiomediastinal silhouette. Status post coronary artery bypass graft. Atherosclerosis of thoracic aorta is noted. No pneumothorax or pleural effusion is noted. Both lungs are clear. The visualized skeletal structures are unremarkable. IMPRESSION: No active cardiopulmonary disease. Aortic Atherosclerosis (ICD10-I70.0). Electronically Signed   By: Lupita Raider, M.D.   On: 05/16/2018 09:31   Disposition   Pt is being discharged home today in good condition.  Follow-up Plans & Appointments   Follow-up Information    Marcelino Duster, [place] Follow up on 06/07/2018.   Specialties:  Physician Assistant, Cardiology, Radiology Why:  Follow-up appointment is on 06/07/2018 at 10:45 AM with Micah Flesher, PA Contact information: [address] [place] [address] [phone]        [facility] Follow up on 05/27/2018.   Specialty:  Cardiology Why:  You have an appointment at the Coumadin clinic at our [facility] office for your INR to be checked on 05/27/2018 at 11:45 AM Contact  information: [address] [place] [address] [phone]         Discharge Instructions    Amb Referral to Cardiac Rehabilitation   Complete by:  As directed    Diagnosis:   Coronary Stents PTCA     Call MD for:   Complete by:  As directed    Call MD for:  difficulty breathing, headache or visual disturbances   Complete by:  As directed  Call MD for:  difficulty breathing, headache or visual disturbances   Complete by:  As directed    Call MD for:  extreme fatigue   Complete by:  As directed    Call MD for:  extreme fatigue   Complete by:  As directed    Call MD for:  hives   Complete by:  As directed    Call MD for:  hives   Complete by:  As directed    Call MD for:  persistant dizziness or light-headedness   Complete by:  As directed    Call MD for:  persistant dizziness or light-headedness   Complete by:  As directed    Call MD for:  persistant nausea and vomiting   Complete by:  As directed    Call MD for:  persistant nausea and vomiting   Complete by:  As directed    Call MD for:  redness, tenderness, or signs of infection (pain, swelling, redness, odor or green/yellow discharge around incision site)   Complete by:  As directed    Call MD for:  redness, tenderness, or signs of infection (pain, swelling, redness, odor or green/yellow discharge around incision site)   Complete by:  As directed    Call MD for:  severe uncontrolled pain   Complete by:  As directed    Call MD for:  severe uncontrolled pain   Complete by:  As directed    Call MD for:  temperature >100.4   Complete by:  As directed    Call MD for:  temperature >100.4   Complete by:  As directed    Diet - low sodium heart healthy   Complete by:  As directed    Diet - low sodium heart healthy   Complete by:  As directed    Discharge instructions   Complete by:  As directed    No driving for 3 days. No lifting over 5 lbs for 1 week. No sexual activity for 1 week.  Keep  procedure site clean & dry. If you notice increased pain, swelling, bleeding or pus, call/return!  You may shower, but no soaking baths/hot tubs/pools for 1 week.   If you notice any bleeding such as blood in stool, black tarry stools, blood in urine, nosebleeds or any other unusual bleeding, call your doctor immediately.   Discharge instructions   Complete by:  As directed    If you notice any bleeding such as blood in stool, black tarry stools, blood in urine, nosebleeds or any other unusual bleeding, call your doctor immediately.  No driving for 3 days. No lifting over 5 lbs for 1 week. No sexual activity for 1 week. Keep procedure site clean & dry. If you notice increased pain, swelling, bleeding or pus, call/return!  You may shower, but no soaking baths/hot tubs/pools for 1 week.   Increase activity slowly   Complete by:  As directed    Increase activity slowly   Complete by:  As directed       Discharge Medications   Allergies as of 05/24/2018      Reactions   Contrast Media [iodinated Diagnostic Agents] Hives, Itching, Swelling      Medication List    TAKE these medications   ARTIFICIAL TEARS 1.4 % ophthalmic solution Generic drug:  polyvinyl alcohol Place 1-2 drops into both eyes daily as needed for dry eyes.   aspirin 81 MG EC tablet Take 1 tablet (81 mg total) by mouth daily.  atorvastatin 80 MG tablet Commonly known as:  LIPITOR TAKE 1 TABLET (80 MG TOTAL) BY MOUTH DAILY AT 6 PM.   CALCIUM 600+D3 PO Take 2,000 Units by mouth.   clopidogrel 75 MG tablet Commonly known as:  PLAVIX TAKE 1 TABLET BY MOUTH DAILY WITH BREAKFAST What changed:  when to take this   diazepam 2 MG tablet Commonly known as:  VALIUM Take 1 tablet (2 mg total) by mouth every 8 (eight) hours as needed for muscle spasms.   Diclofenac Sodium 3 % Gel Place 1 application onto the skin 2 (two) times daily. To affected area.   enoxaparin 80 MG/0.8ML injection Commonly known as:   LOVENOX Inject 0.8 mLs (80 mg total) into the skin every 12 (twelve) hours for 7 doses.   fluticasone 50 MCG/ACT nasal spray Commonly known as:  FLONASE Place 1 spray into both nostrils daily.   isosorbide mononitrate 30 MG 24 hr tablet Commonly known as:  IMDUR TAKE 1 TABLET BY MOUTH EVERY DAY   lisinopril 2.5 MG tablet Commonly known as:  PRINIVIL,ZESTRIL Take 1 tablet (2.5 mg total) by mouth daily. Start taking on:  05/25/2018 What changed:    medication strength  how much to take   multivitamin with minerals Tabs tablet Take 1 tablet by mouth daily.   nitroGLYCERIN 0.4 MG SL tablet Commonly known as:  NITROSTAT PLACE 1 TABLET (0.4 MG TOTAL) UNDER THE TONGUE EVERY 5 (FIVE) MINUTES AS NEEDED FOR CHEST PAIN.   ondansetron 4 MG tablet Commonly known as:  ZOFRAN Take 1 tablet (4 mg total) by mouth every 6 (six) hours.   pantoprazole 40 MG tablet Commonly known as:  PROTONIX TAKE 1 TABLET BY MOUTH EVERY DAY   warfarin 5 MG tablet Commonly known as:  COUMADIN Take as directed. If you are unsure how to take this medication, talk to your nurse or doctor. Original instructions:  TAKE 1/2 TO 1 TABLET BY MOUTH DAILY OR AS DIRECTED BY COUMADIN CLINIC        Acute coronary syndrome (MI, NSTEMI, STEMI, etc) this admission?: Yes.     AHA/ACC Clinical Performance & Quality Measures: 1. Aspirin prescribed? - Yes 2. ADP Receptor Inhibitor (Plavix/Clopidogrel, Brilinta/Ticagrelor or Effient/Prasugrel) prescribed (includes medically managed patients)? - Yes 3. Beta Blocker prescribed? - Yes 4. High Intensity Statin (Lipitor 40-80mg  or Crestor 20-40mg ) prescribed? - Yes 5. EF assessed during THIS hospitalization? - Yes 6. For EF <40%, was ACEI/ARB prescribed? - Not Applicable (EF >/= 40%) 7. For EF <40%, Aldosterone Antagonist (Spironolactone or Eplerenone) prescribed? - Not Applicable (EF >/= 40%) 8. Cardiac Rehab Phase II ordered (Included Medically managed Patients)? - Yes    Outstanding Labs/Studies   INR check on 05/27/2018  Duration of Discharge Encounter   Greater than 30 minutes including physician time.  Signed, Georgie Chard, NP 05/24/2018, 12:22 PM   Attending Note:   The patient was seen and examined.  Agree with assessment and plan as noted above.  Changes made to the above note as needed.  Patient seen and independently examined with  Georgie Chard, NP .   We discussed all aspects of the encounter. I agree with the assessment and plan as stated above.  1.  Coronary artery disease: The patient is now stable.  She is not having any episodes of chest pain or shortness of breath.  She had some hypotension yesterday and we delayed transfer out of the intensive care unit.  She seems to be doing a lot better.  She  is ambulated with cardiac rehab.  She will follow-up as noted above.        I have spent a total of 40 minutes with patient reviewing hospital  notes , telemetry, EKGs, labs and examining patient as well as establishing an assessment and plan that was discussed with the patient. > 50% of time was spent in direct patient care.    Vesta Mixer, Montez Hageman., MD, [facility] 05/27/2018, 8:48 AM [address]  [address] Office [phone] Pager [phone]

## 2018-05-24 NOTE — Progress Notes (Signed)
Progress Note  Patient Name: [name] Date of Encounter: 05/24/2018  Primary Cardiologist: Chrystie Nose, MD   Subjective   67 year old female with a history of Bentall procedure and status post stenting.  She has a stent in the saphenous vein graft that leads to her left anterior descending artery and left circumflex artery.  The stent extends out into the aorta.  PCI was attempted on this graft but Dr. Eldridge Dace was not able to engage the graft because of the stent location. She is now status post stenting of her left main procedure.  She tolerated the procedure fairly well.  She did have some hypotension with a systolic blood pressure in the 60s yesterday.  This responded to IV normal saline.  The patient did not have any further complications.  She is pain-free this morning  Inpatient Medications    Scheduled Meds: . aspirin  81 mg Oral Daily  . atorvastatin  80 mg Oral q1800  . clopidogrel  75 mg Oral Q breakfast  . lisinopril  2.5 mg Oral Daily  . multivitamin with minerals  1 tablet Oral Daily  . pantoprazole  40 mg Oral Daily  . sodium chloride flush  3 mL Intravenous Q12H  . Warfarin - Pharmacist Dosing Inpatient   Does not apply q1800   Continuous Infusions: . sodium chloride 10 mL/hr at 05/21/18 0800  . sodium chloride    . heparin 800 Units/hr (05/24/18 0600)  . nitroGLYCERIN Stopped (05/20/18 1800)  . sodium chloride Stopped (05/22/18 2342)   PRN Meds: sodium chloride, sodium chloride, acetaminophen, nitroGLYCERIN, ondansetron (ZOFRAN) IV, polyvinyl alcohol, sodium chloride, sodium chloride flush, zolpidem   Vital Signs    Vitals:   05/24/18 0100 05/24/18 0204 05/24/18 0320 05/24/18 0751  BP:  (!) 141/60 115/78 (!) 113/44  Pulse:  (!) 59 60 60  Resp: 18 20 15 16   Temp:  98.1 F (36.7 C) 97.9 F (36.6 C) (!) 97 F (36.1 C)  TempSrc:  Oral Oral Oral  SpO2:  100% 100% 100%  Weight:  77.4 kg    Height:  5\' 7"  (1.702 m)      Intake/Output Summary  (Last 24 hours) at 05/24/2018 0824 Last data filed at 05/24/2018 0600 Gross per 24 hour  Intake 355.99 ml  Output 300 ml  Net 55.99 ml   Filed Weights   05/20/18 0604 05/21/18 0600 05/24/18 0204  Weight: 74.1 kg 75.4 kg 77.4 kg    Telemetry    NSR  - Personally Reviewed  ECG     NSR  - Personally Reviewed  Physical Exam   GEN: No acute distress.   Neck: No JVD Cardiac:  RR, 2-3/6 systolic murmur  Respiratory: Clear to auscultation bilaterally. GI: Soft, nontender, non-distended  MS: No edema; No deformity. Neuro:  Nonfocal  Psych: Normal affect   Labs    Chemistry Recent Labs  Lab 05/17/18 1114  05/21/18 1218 05/22/18 0053 05/24/18 0242  NA 144   < > 143 144 142  K 3.5   < > 3.0* 4.2 3.6  CL 113*   < > 115* 113* 110  CO2 23   < > 24 25 26   GLUCOSE 79   < > 95 213* 99  BUN 10   < > 13 14 13   CREATININE 0.91   < > 0.74 0.87 0.88  CALCIUM 9.2   < > 8.1* 8.8* 9.2  PROT 6.0*  --   --   --   --  ALBUMIN 3.5  --   --   --   --   AST 29  --   --   --   --   ALT 31  --   --   --   --   ALKPHOS 67  --   --   --   --   BILITOT 1.5*  --   --   --   --   GFRNONAA >60   < > >60 >60 >60  GFRAA >60   < > >60 >60 >60  ANIONGAP 8   < > 4* 6 6   < > = values in this interval not displayed.     Hematology Recent Labs  Lab 05/22/18 0053 05/23/18 0306 05/24/18 0242  WBC 11.3* 10.3 7.9  RBC 4.35 3.93 4.10  HGB 11.2* 10.1* 10.7*  HCT 35.5* 32.4* 33.7*  MCV 81.6 82.4 82.2  MCH 25.7* 25.7* 26.1  MCHC 31.5 31.2 31.8  RDW 16.6* 16.7* 16.8*  PLT 165 152 147*    Cardiac Enzymes Recent Labs  Lab 05/17/18 1114  TROPONINI <0.03   No results for input(s): TROPIPOC in the last 168 hours.   BNPNo results for input(s): BNP, PROBNP in the last 168 hours.   DDimer No results for input(s): DDIMER in the last 168 hours.   Radiology    No results found.  Cardiac Studies     Patient Profile     67 y.o. female with Bentall procedure.  She is status post left main  stenting.  Assessment & Plan    1.  Coronary artery disease: The patient is status post PTCA and stenting of her native left main.  She is feeling quite well.  Not having further episodes of chest pain.  She ambulated with rehab today and did quite well. Plan is for her to take warfarin, Plavix, aspirin for 1 month.  At that point we will discontinue the aspirin and we will continue Plavix and warfarin long-term.  2.  Status post mechanical aortic valve replacement.  Her INR is 1.4.  She will need Lovenox bridging while at home.  She is given herself Lovenox in the past and knows how to give the shots.  We will have her check her INR at our [facility] line Coumadin clinic on Monday.  We will get a pharmacy consultation to help [place] with the Lovenox dosing. She will need a dose of Lovenox this morning. She will need 6 doses of Lovenox to get her out to Monday morning.  She will need her INR checked in our [facility] clinic on Monday. She is to resume her devious home dose of warfarin. ` Follow up with Dr. Rennis Golden or APP in several weeks.   For questions or updates, please contact [facility] Please consult [email or web address] for contact info under Cardiology/STEMI.      Signed, Kristeen Miss, MD  05/24/2018, 8:24 AM

## 2018-05-24 NOTE — Care Management (Addendum)
Bedside nurse faxed prescription to outpt pharmacy.  Pt given address for pharmacy  Update:  [facility] outpt pharmacy confirmed that pt can receive medication below for $10.  CM text paged Dr Melburn Popper and requested prescription be faxed to outpt pharmacy.  CM also informed bedside nurse to ensure medication is sent to pharmacy prior to discharge   CM contacted [facility] outpt pharmacy and requested benefit check for Lovenox 80mg  BID for 5 days (per unit pharmacist).  Pharmacy will follow up

## 2018-05-27 ENCOUNTER — Ambulatory Visit (INDEPENDENT_AMBULATORY_CARE_PROVIDER_SITE_OTHER): Payer: Commercial Managed Care - PPO | Admitting: Pharmacist

## 2018-05-27 ENCOUNTER — Telehealth: Payer: Self-pay | Admitting: Internal Medicine

## 2018-05-27 ENCOUNTER — Other Ambulatory Visit: Payer: Self-pay | Admitting: Internal Medicine

## 2018-05-27 DIAGNOSIS — Z7901 Long term (current) use of anticoagulants: Secondary | ICD-10-CM | POA: Diagnosis not present

## 2018-05-27 DIAGNOSIS — Z952 Presence of prosthetic heart valve: Secondary | ICD-10-CM | POA: Diagnosis not present

## 2018-05-27 DIAGNOSIS — I6523 Occlusion and stenosis of bilateral carotid arteries: Secondary | ICD-10-CM

## 2018-05-27 LAB — POCT INR: INR: 2.1 (ref 2.0–3.0)

## 2018-05-27 NOTE — Telephone Encounter (Signed)
New Message:    Pt have been in the hospital,discharged on 05-24-09. She needs a note to return to work on next Tuesday(September 3,2019)please.

## 2018-05-27 NOTE — Telephone Encounter (Signed)
Called patient, she was being seen in Coumadin Clinic in office, PharmD printed off letter that was already in the chart and gave to patient.   No questions or concerns.

## 2018-05-29 ENCOUNTER — Telehealth: Payer: Self-pay | Admitting: Internal Medicine

## 2018-05-29 ENCOUNTER — Telehealth (HOSPITAL_COMMUNITY): Payer: Self-pay

## 2018-05-29 NOTE — Telephone Encounter (Signed)
Attempted to make initial call to patient in regards to Cardiac Rehab - lm on vm °

## 2018-05-29 NOTE — Telephone Encounter (Signed)
F/U Message       Patient called checking the status of her return to work note. Patient states it must be written without restrictions.

## 2018-05-29 NOTE — Telephone Encounter (Signed)
Patients insurance is active and benefits verified through [facility] - No co-pay, deductible amount of $1,000/$1,000 has been met, out of pocket amount of $4,500/$1,942.39 has been met, 10% co-insurance, and no pre-authorization is required. Reference [id number]  Will contact patient in regards to Cardiac Rehab to see if she is interested in the Cardiac Rehab Program. If interested, patient will need to complete follow up appt. Once completed, patient will be contacted for scheduling.

## 2018-05-29 NOTE — Telephone Encounter (Signed)
Spoke to patient. She states she would like to pickup the note. RN informed patient letter can pick up.

## 2018-06-07 ENCOUNTER — Other Ambulatory Visit: Payer: Self-pay

## 2018-06-07 ENCOUNTER — Ambulatory Visit: Payer: Commercial Managed Care - PPO | Admitting: Physician Assistant

## 2018-06-07 ENCOUNTER — Encounter: Payer: Self-pay | Admitting: Physician Assistant

## 2018-06-07 ENCOUNTER — Emergency Department (HOSPITAL_COMMUNITY): Payer: Commercial Managed Care - PPO

## 2018-06-07 ENCOUNTER — Encounter (HOSPITAL_COMMUNITY): Payer: Self-pay | Admitting: Emergency Medicine

## 2018-06-07 ENCOUNTER — Observation Stay
Admission: AD | Admit: 2018-06-07 | Payer: Commercial Managed Care - PPO | Source: Ambulatory Visit | Admitting: Cardiovascular Disease

## 2018-06-07 ENCOUNTER — Ambulatory Visit (HOSPITAL_BASED_OUTPATIENT_CLINIC_OR_DEPARTMENT_OTHER)
Admission: RE | Admit: 2018-06-07 | Discharge: 2018-06-07 | Disposition: A | Discharge status: Home / Self Care | Payer: Commercial Managed Care - PPO | Source: Ambulatory Visit | Attending: Physician Assistant | Admitting: Physician Assistant

## 2018-06-07 ENCOUNTER — Inpatient Hospital Stay (HOSPITAL_COMMUNITY)
Admission: EM | Admit: 2018-06-07 | Discharge: 2018-06-18 | DRG: 252 | Disposition: A | Discharge status: Home Health | Payer: Commercial Managed Care - PPO | Source: Ambulatory Visit | Attending: Cardiovascular Disease | Admitting: Cardiovascular Disease

## 2018-06-07 VITALS — BP 100/52 | HR 77 | Ht 67.0 in | Wt 156.4 lb

## 2018-06-07 DIAGNOSIS — I724 Aneurysm of artery of lower extremity: Secondary | ICD-10-CM

## 2018-06-07 DIAGNOSIS — I729 Aneurysm of unspecified site: Secondary | ICD-10-CM

## 2018-06-07 DIAGNOSIS — T40605A Adverse effect of unspecified narcotics, initial encounter: Secondary | ICD-10-CM | POA: Diagnosis not present

## 2018-06-07 DIAGNOSIS — I255 Ischemic cardiomyopathy: Secondary | ICD-10-CM | POA: Diagnosis present

## 2018-06-07 DIAGNOSIS — K219 Gastro-esophageal reflux disease without esophagitis: Secondary | ICD-10-CM | POA: Diagnosis present

## 2018-06-07 DIAGNOSIS — I5042 Chronic combined systolic (congestive) and diastolic (congestive) heart failure: Secondary | ICD-10-CM | POA: Diagnosis present

## 2018-06-07 DIAGNOSIS — Z8249 Family history of ischemic heart disease and other diseases of the circulatory system: Secondary | ICD-10-CM

## 2018-06-07 DIAGNOSIS — I9789 Other postprocedural complications and disorders of the circulatory system, not elsewhere classified: Secondary | ICD-10-CM

## 2018-06-07 DIAGNOSIS — M069 Rheumatoid arthritis, unspecified: Secondary | ICD-10-CM | POA: Diagnosis present

## 2018-06-07 DIAGNOSIS — IMO0002 Reserved for concepts with insufficient information to code with codable children: Secondary | ICD-10-CM

## 2018-06-07 DIAGNOSIS — I491 Atrial premature depolarization: Secondary | ICD-10-CM | POA: Diagnosis present

## 2018-06-07 DIAGNOSIS — Z91041 Radiographic dye allergy status: Secondary | ICD-10-CM

## 2018-06-07 DIAGNOSIS — R008 Other abnormalities of heart beat: Secondary | ICD-10-CM | POA: Diagnosis present

## 2018-06-07 DIAGNOSIS — I97638 Postprocedural hematoma of a circulatory system organ or structure following other circulatory system procedure: Secondary | ICD-10-CM | POA: Diagnosis not present

## 2018-06-07 DIAGNOSIS — D869 Sarcoidosis, unspecified: Secondary | ICD-10-CM | POA: Diagnosis present

## 2018-06-07 DIAGNOSIS — Z79899 Other long term (current) drug therapy: Secondary | ICD-10-CM

## 2018-06-07 DIAGNOSIS — N390 Urinary tract infection, site not specified: Secondary | ICD-10-CM | POA: Diagnosis present

## 2018-06-07 DIAGNOSIS — I2510 Atherosclerotic heart disease of native coronary artery without angina pectoris: Secondary | ICD-10-CM | POA: Diagnosis present

## 2018-06-07 DIAGNOSIS — Z955 Presence of coronary angioplasty implant and graft: Secondary | ICD-10-CM

## 2018-06-07 DIAGNOSIS — R319 Hematuria, unspecified: Secondary | ICD-10-CM

## 2018-06-07 DIAGNOSIS — E785 Hyperlipidemia, unspecified: Secondary | ICD-10-CM | POA: Diagnosis present

## 2018-06-07 DIAGNOSIS — R109 Unspecified abdominal pain: Secondary | ICD-10-CM

## 2018-06-07 DIAGNOSIS — I252 Old myocardial infarction: Secondary | ICD-10-CM

## 2018-06-07 DIAGNOSIS — N179 Acute kidney failure, unspecified: Secondary | ICD-10-CM | POA: Diagnosis present

## 2018-06-07 DIAGNOSIS — I739 Peripheral vascular disease, unspecified: Secondary | ICD-10-CM | POA: Diagnosis present

## 2018-06-07 DIAGNOSIS — I493 Ventricular premature depolarization: Secondary | ICD-10-CM | POA: Diagnosis present

## 2018-06-07 DIAGNOSIS — K851 Biliary acute pancreatitis without necrosis or infection: Secondary | ICD-10-CM | POA: Diagnosis not present

## 2018-06-07 DIAGNOSIS — Z7982 Long term (current) use of aspirin: Secondary | ICD-10-CM

## 2018-06-07 DIAGNOSIS — I10 Essential (primary) hypertension: Secondary | ICD-10-CM | POA: Diagnosis not present

## 2018-06-07 DIAGNOSIS — Z7902 Long term (current) use of antithrombotics/antiplatelets: Secondary | ICD-10-CM

## 2018-06-07 DIAGNOSIS — K5903 Drug induced constipation: Secondary | ICD-10-CM | POA: Diagnosis not present

## 2018-06-07 DIAGNOSIS — Z7951 Long term (current) use of inhaled steroids: Secondary | ICD-10-CM

## 2018-06-07 DIAGNOSIS — Z951 Presence of aortocoronary bypass graft: Secondary | ICD-10-CM

## 2018-06-07 DIAGNOSIS — D62 Acute posthemorrhagic anemia: Secondary | ICD-10-CM | POA: Diagnosis not present

## 2018-06-07 DIAGNOSIS — R9431 Abnormal electrocardiogram [ECG] [EKG]: Secondary | ICD-10-CM

## 2018-06-07 DIAGNOSIS — Z952 Presence of prosthetic heart valve: Secondary | ICD-10-CM

## 2018-06-07 DIAGNOSIS — Z7901 Long term (current) use of anticoagulants: Secondary | ICD-10-CM

## 2018-06-07 DIAGNOSIS — R0789 Other chest pain: Secondary | ICD-10-CM

## 2018-06-07 DIAGNOSIS — B9620 Unspecified Escherichia coli [E. coli] as the cause of diseases classified elsewhere: Secondary | ICD-10-CM | POA: Diagnosis present

## 2018-06-07 DIAGNOSIS — I110 Hypertensive heart disease with heart failure: Secondary | ICD-10-CM | POA: Diagnosis present

## 2018-06-07 LAB — CBC WITH DIFFERENTIAL/PLATELET
ABS IMMATURE GRANULOCYTES: 0 10*3/uL (ref 0.0–0.1)
BASOS ABS: 0 10*3/uL (ref 0.0–0.1)
Basophils Relative: 0 %
EOS PCT: 1 %
Eosinophils Absolute: 0.1 10*3/uL (ref 0.0–0.7)
HCT: 28.6 % — ABNORMAL LOW (ref 36.0–46.0)
Hemoglobin: 9.1 g/dL — ABNORMAL LOW (ref 12.0–15.0)
Immature Granulocytes: 1 %
Lymphocytes Relative: 9 %
Lymphs Abs: 0.5 10*3/uL — ABNORMAL LOW (ref 0.7–4.0)
MCH: 26 pg (ref 26.0–34.0)
MCHC: 31.8 g/dL (ref 30.0–36.0)
MCV: 81.7 fL (ref 78.0–100.0)
MONO ABS: 0.6 10*3/uL (ref 0.1–1.0)
MONOS PCT: 11 %
NEUTROS ABS: 4.2 10*3/uL (ref 1.7–7.7)
Neutrophils Relative %: 78 %
PLATELETS: 314 10*3/uL (ref 150–400)
RBC: 3.5 MIL/uL — ABNORMAL LOW (ref 3.87–5.11)
RDW: 17.5 % — ABNORMAL HIGH (ref 11.5–15.5)
WBC: 5.4 10*3/uL (ref 4.0–10.5)

## 2018-06-07 LAB — COMPREHENSIVE METABOLIC PANEL
ALBUMIN: 3.2 g/dL — AB (ref 3.5–5.0)
ALT: 32 U/L (ref 0–44)
AST: 34 U/L (ref 15–41)
Alkaline Phosphatase: 123 U/L (ref 38–126)
Anion gap: 8 (ref 5–15)
BUN: 15 mg/dL (ref 8–23)
CHLORIDE: 107 mmol/L (ref 98–111)
CO2: 25 mmol/L (ref 22–32)
CREATININE: 1.14 mg/dL — AB (ref 0.44–1.00)
Calcium: 9 mg/dL (ref 8.9–10.3)
GFR calc Af Amer: 57 mL/min — ABNORMAL LOW (ref >60)
GFR, EST NON AFRICAN AMERICAN: 49 mL/min — AB (ref >60)
Glucose, Bld: 107 mg/dL — ABNORMAL HIGH (ref 70–99)
Potassium: 4.1 mmol/L (ref 3.5–5.1)
SODIUM: 140 mmol/L (ref 135–145)
Total Bilirubin: 1.2 mg/dL (ref 0.3–1.2)
Total Protein: 6.4 g/dL — ABNORMAL LOW (ref 6.5–8.1)

## 2018-06-07 LAB — CBC
HCT: 31.4 % — ABNORMAL LOW (ref 36.0–46.0)
Hemoglobin: 9.8 g/dL — ABNORMAL LOW (ref 12.0–15.0)
MCH: 26.1 pg (ref 26.0–34.0)
MCHC: 31.2 g/dL (ref 30.0–36.0)
MCV: 83.5 fL (ref 78.0–100.0)
PLATELETS: 350 10*3/uL (ref 150–400)
RBC: 3.76 MIL/uL — AB (ref 3.87–5.11)
RDW: 17.6 % — ABNORMAL HIGH (ref 11.5–15.5)
WBC: 6.4 10*3/uL (ref 4.0–10.5)

## 2018-06-07 LAB — URINALYSIS, ROUTINE W REFLEX MICROSCOPIC
Bilirubin Urine: NEGATIVE
Glucose, UA: NEGATIVE mg/dL
Ketones, ur: NEGATIVE mg/dL
Nitrite: POSITIVE — AB
PH: 6 (ref 5.0–8.0)
Protein, ur: 100 mg/dL — AB
RBC / HPF: >50 RBC/hpf — ABNORMAL HIGH (ref 0–5)
SPECIFIC GRAVITY, URINE: 1.01 (ref 1.005–1.030)

## 2018-06-07 LAB — TROPONIN I

## 2018-06-07 LAB — PROTIME-INR
INR: 2.09
PROTHROMBIN TIME: 23.3 s — AB (ref 11.4–15.2)

## 2018-06-07 LAB — I-STAT TROPONIN, ED: TROPONIN I, POC: 0 ng/mL (ref 0.00–0.08)

## 2018-06-07 MED ORDER — CLOPIDOGREL BISULFATE 75 MG PO TABS
75.0000 mg | ORAL_TABLET | Freq: Every day | ORAL | Status: DC
  Administered 2018-06-07 – 2018-06-08 (×2): 75 mg via ORAL
  Filled 2018-06-07 (×2): qty 1

## 2018-06-07 MED ORDER — ONDANSETRON HCL 4 MG/2ML IJ SOLN
4.0000 mg | Freq: Four times a day (QID) | INTRAMUSCULAR | Status: DC | PRN
  Administered 2018-06-08 – 2018-06-09 (×2): 4 mg via INTRAVENOUS
  Filled 2018-06-07 (×2): qty 2

## 2018-06-07 MED ORDER — ATORVASTATIN CALCIUM 80 MG PO TABS
80.0000 mg | ORAL_TABLET | Freq: Every day | ORAL | Status: DC
  Administered 2018-06-07 – 2018-06-17 (×11): 80 mg via ORAL
  Filled 2018-06-07 (×11): qty 1

## 2018-06-07 MED ORDER — ACETAMINOPHEN 500 MG PO TABS: 500.0000 mg | ORAL_TABLET | Freq: Four times a day (QID) | ORAL | Status: DC | PRN

## 2018-06-07 MED ORDER — MAGNESIUM HYDROXIDE 400 MG/5ML PO SUSP
30.0000 mL | Freq: Every day | ORAL | Status: DC | PRN
  Administered 2018-06-08 – 2018-06-14 (×4): 30 mL via ORAL
  Filled 2018-06-07 (×5): qty 30

## 2018-06-07 MED ORDER — PANTOPRAZOLE SODIUM 40 MG PO TBEC
40.0000 mg | DELAYED_RELEASE_TABLET | Freq: Every day | ORAL | Status: DC
  Administered 2018-06-07 – 2018-06-18 (×11): 40 mg via ORAL
  Filled 2018-06-07 (×11): qty 1

## 2018-06-07 MED ORDER — SODIUM CHLORIDE 0.9 % IV SOLN
1.0000 g | Freq: Once | INTRAVENOUS | Status: AC
  Administered 2018-06-07: 1 g via INTRAVENOUS
  Filled 2018-06-07: qty 10

## 2018-06-07 MED ORDER — ACETAMINOPHEN 325 MG PO TABS
650.0000 mg | ORAL_TABLET | ORAL | Status: DC | PRN
  Administered 2018-06-08 – 2018-06-14 (×7): 650 mg via ORAL
  Filled 2018-06-07 (×8): qty 2

## 2018-06-07 MED ORDER — ISOSORBIDE MONONITRATE ER 30 MG PO TB24
30.0000 mg | ORAL_TABLET | Freq: Every day | ORAL | Status: DC
  Administered 2018-06-07 – 2018-06-11 (×4): 30 mg via ORAL
  Filled 2018-06-07 (×4): qty 1

## 2018-06-07 MED ORDER — CLOPIDOGREL BISULFATE 75 MG PO TABS: 75.0000 mg | ORAL_TABLET | Freq: Every day | ORAL | Status: DC

## 2018-06-07 MED ORDER — ASPIRIN EC 81 MG PO TBEC
81.0000 mg | DELAYED_RELEASE_TABLET | Freq: Every day | ORAL | Status: DC
  Administered 2018-06-07 – 2018-06-10 (×3): 81 mg via ORAL
  Filled 2018-06-07 (×3): qty 1

## 2018-06-07 MED ORDER — NITROGLYCERIN 0.4 MG SL SUBL: 0.4000 mg | SUBLINGUAL_TABLET | SUBLINGUAL | Status: DC | PRN

## 2018-06-07 NOTE — Consult Note (Addendum)
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  The patient has a short neck that is fairly small but I think she would be a reasonable candidate for duplex directed manual occlusion of the pseudoaneurysm although she might require some sedation.  My second choice would be thrombin injection.  Of note she is on Coumadin therapy so this would be have to be held prior to the procedure.  We will let cardiology decide if she will need a Lovenox bridge.  We also discussed the option of surgical exploration although there is a significant risk of wound complications with this.  Given the size of the aneurysm and given that she is on Coumadin I do not think she is a good candidate for observation as I doubt this will clot on its own.  Apparently she is being admitted because of some elevated troponins.  We will follow.  [name], MD, FACS Beeper [phone] Office: [phone]   Reason for Consult:  Left groin psa Requesting Physician:  Kateri Mc, [place] (cardiology) MRN #:  [id number]  History of Present Illness: This is a 67 y.o. female who states she had a cardiac catheterization a couple of weeks ago.  She states that they did the stent through the right groin and the balloon through the left groin.  She states that they held pressure in her left groin with a fist and it hurt at that time.  She says they saw a bubble and had to hold pressure for another 20 minutes.  She states she went back to work this week and continued to have increasing pain in her left groin.  She states she was having a tiny amount of blood in her urine.  She had a scheduled f/u appointment today at the cardiology office.  She had an u/s of her left groin, which revealed a psa that measured 2.5 x 2.8cm with a 62mm neck and ~ 44mm wide.  She was sent to the ER for vascular evaluation.    Also during her visit  this am, she had an EKG with ST depression and TWI inferior leads V4/5/6 and recommended admission to the hospital.    She is on coumadin for a mechanical AVR.  Her last INR was 2.1 on 05/27/18.  She also has hx of replacement of ascending aorta.  She takes a daily aspirin and plavix.  She is on an ACEI.    Past Medical History:  Diagnosis Date  . Ascending aortic aneurysm (HCC)    a.  redo replacement of fusiform aneurysm of ascending aorta (complicated by pericardial hematoma s/p evacuation) in 2014.  Marland Kitchen Contrast media allergy   . Coronary artery disease    a. s/p CABG x2 in 2014. b. s/p DES to SVG-LAD in 2017.  . H/O mechanical aortic valve replacement   . Hyperlipidemia   . Hypertension   . Ischemic cardiomyopathy    a. EF 40-45% in 2017 at time of NSTEMI, improved to normal in 2018.  . Non-STEMI (non-ST elevated myocardial infarction) (HCC)   . Premature atrial contractions   . PVC's (premature ventricular contractions)   . RA (rheumatoid arthritis) (HCC)   . S/P AVR (aortic valve replacement) 08/1999   St. Jude AVR for aortic insuff, Goal INR 2.5-3.5  . 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 SVG to LAD, SVG to Cfx; re-do replacement of fusiform  aneursym of ascending thoracic aorta (Dr. Donata Clay) - complicated by pericardial hematoma --> subxiphoid pericardial exposure and evacuation b. Cath 06/2016: DES to SVG-LAD ostium  . Sarcoid   . Second degree AV block    a. ? possibly seen in 12/2016 clinic visit - EKG not truly suggestive  . Severe anemia   . 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 s/p left subclavian bypass.    Past Surgical History:  Procedure Laterality Date  . AORTIC VALVE REPLACEMENT  08/23/1999   23mm St. Jude, Bentall procedure, root replacemtn   . BYPASS GRAFT ANGIOGRAPHY  05/20/2018   Procedure: BYPASS GRAFT ANGIOGRAPHY;  Surgeon: Corky Crafts, MD;  Location: [facility] INVASIVE CV LAB;  Service: Cardiovascular;;  . CARDIAC CATHETERIZATION  08/1999   normal L  main, LAD & first diagonal are normal, normal Cfx, RCA with mild ostial narrowing, normal LV systolic function, severe AI, mild MR, normal PAP (Dr. Mervyn Skeeters. Little)   . CARDIAC CATHETERIZATION N/A 06/22/2016   Procedure: Left Heart Cath and Cors/Grafts Angiography;  Surgeon: Marykay Lex, MD;  Location: [facility] INVASIVE CV LAB;  Service: Cardiovascular;  Laterality: N/A;  . CARDIAC CATHETERIZATION N/A 06/23/2016   Procedure: Left Heart Cath and Coronary Angiography;  Surgeon: Tonny Bollman, MD;  Location: [facility] INVASIVE CV LAB;  Service: Cardiovascular;  Laterality: N/A;  . CARDIAC CATHETERIZATION N/A 06/23/2016   Procedure: Coronary Stent Intervention;  Surgeon: Tonny Bollman, MD;  Location: [facility] INVASIVE CV LAB;  Service: Cardiovascular;  Laterality: N/A;  . CORONARY ARTERY BYPASS GRAFT  11/05/2012   Procedure: CORONARY ARTERY BYPASS GRAFTING (CABG);  Surgeon: Kerin Perna, MD;  Location: [facility] OR;  Service: Open Heart Surgery;  Laterality: N/A;  cannulate right subclavian  . CORONARY STENT INTERVENTION N/A 05/21/2018   Procedure: CORONARY STENT INTERVENTION;  Surgeon: Corky Crafts, MD;  Location: [facility] INVASIVE CV LAB;  Service: Cardiovascular;  Laterality: N/A;  . ENDOVEIN HARVEST OF GREATER SAPHENOUS VEIN  11/05/2012   Procedure: ENDOVEIN HARVEST OF GREATER SAPHENOUS VEIN;  Surgeon: Kerin Perna, MD;  Location: [facility] OR;  Service: Open Heart Surgery;  Laterality: Right;  . IABP INSERTION N/A 05/21/2018   Procedure: IABP INSERTION;  Surgeon: Corky Crafts, MD;  Location: [facility] INVASIVE CV LAB;  Service: Cardiovascular;  Laterality: N/A;  . INTRAOPERATIVE TRANSESOPHAGEAL ECHOCARDIOGRAM  11/05/2012   Procedure: INTRAOPERATIVE TRANSESOPHAGEAL ECHOCARDIOGRAM;  Surgeon: Kerin Perna, MD;  Location: [facility] OR;  Service: Open Heart Surgery;  Laterality: N/A;  . INTRAOPERATIVE TRANSESOPHAGEAL ECHOCARDIOGRAM N/A 11/21/2012   Procedure: INTRAOPERATIVE TRANSESOPHAGEAL ECHOCARDIOGRAM;  Surgeon: Kerin Perna, MD;   Location: [facility] OR;  Service: Open Heart Surgery;  Laterality: N/A;  . LEFT HEART CATHETERIZATION WITH CORONARY ANGIOGRAM N/A 11/04/2012   Procedure: LEFT HEART CATHETERIZATION WITH CORONARY ANGIOGRAM;  Surgeon: Chrystie Nose, MD;  Location: [facility] CATH LAB;  Service: Cardiovascular;  Laterality: N/A;  . MEDIASTINOSCOPY  08/2005   bronchoscopy & mediastinoscopy for mediastinal adenopathy (Dr. Donata Clay)   . NM MYOCAR PERF WALL MOTION  01/2012   lexiscan; small, fixed apical septal breast attenuation artifact & inferolateral bowel attenuation, no reversible ischemia, post-stress EF 64%, low risk scan   . PERICARDIAL WINDOW N/A 11/21/2012   Procedure: PERICARDIAL WINDOW;  Surgeon: Kerin Perna, MD;  Location: [facility] OR;  Service: Thoracic;  Laterality: N/A;  . REPLACEMENT ASCENDING AORTA  11/05/2012   Procedure: REPLACEMENT ASCENDING AORTA;  Surgeon: Kerin Perna, MD;  Location: [facility] OR;  Service: Open Heart Surgery;  Laterality:  N/A;  . SUBCLAVIAN ANGIOGRAM  05/10/2000   left subclavian arteriogram; patent graft, mild stenosis (Dr. Amada Kingfisher)   . SUBCLAVIAN BYPASS GRAFT  05/16/1999   left subclavian bypass with 21mm Darcon graft (Dr. Amada Kingfisher)  . TRANSTHORACIC ECHOCARDIOGRAM  11/20/2012   EF 65-70%, mild conc LVH, grade 2 diastolic dysfunction, mech AV, mild MR    Allergies  Allergen Reactions  . Contrast Media [Iodinated Diagnostic Agents] Hives, Itching and Swelling    Prior to Admission medications   Medication Sig Start Date End Date Taking? Authorizing Provider  aspirin EC 81 MG EC tablet Take 1 tablet (81 mg total) by mouth daily. 06/26/16   Iran Ouch, Lennart Pall, PA-C  atorvastatin (LIPITOR) 80 MG tablet TAKE 1 TABLET (80 MG TOTAL) BY MOUTH DAILY AT 6 PM. 12/18/17   Hilty, Lisette Abu, MD  Calcium Carb-Cholecalciferol (CALCIUM 600+D3 PO) Take 2,000 Units by mouth.    [provider]  clopidogrel (PLAVIX) 75 MG tablet TAKE 1 TABLET BY MOUTH DAILY WITH BREAKFAST Patient taking differently: Take  75 mg by mouth daily.  02/14/18   Hilty, Lisette Abu, MD  diazepam (VALIUM) 2 MG tablet Take 1 tablet (2 mg total) by mouth every 8 (eight) hours as needed for muscle spasms. 12/29/16   Everlene Farrier, PA-C  Diclofenac Sodium 3 % GEL Place 1 application onto the skin 2 (two) times daily. To affected area. 12/29/16   Everlene Farrier, PA-C  enoxaparin (LOVENOX) 80 MG/0.8ML injection Inject 0.8 mLs (80 mg total) into the skin every 12 (twelve) hours for 7 doses. 05/24/18 06/07/18  Filbert Schilder, NP  fluticasone (FLONASE) 50 MCG/ACT nasal spray Place 1 spray into both nostrils daily. 11/20/17   Maxwell Caul, PA-C  isosorbide mononitrate (IMDUR) 30 MG 24 hr tablet TAKE 1 TABLET BY MOUTH EVERY DAY 11/15/17   Barrett, Joline Salt, PA-C  lisinopril (PRINIVIL,ZESTRIL) 2.5 MG tablet Take 1 tablet (2.5 mg total) by mouth daily. 05/25/18   Filbert Schilder, NP  Multiple Vitamin (MULTIVITAMIN WITH MINERALS) TABS tablet Take 1 tablet by mouth daily.    [provider]  nitroGLYCERIN (NITROSTAT) 0.4 MG SL tablet PLACE 1 TABLET (0.4 MG TOTAL) UNDER THE TONGUE EVERY 5 (FIVE) MINUTES AS NEEDED FOR CHEST PAIN. 02/13/18   Hilty, Lisette Abu, MD  ondansetron (ZOFRAN) 4 MG tablet Take 1 tablet (4 mg total) by mouth every 6 (six) hours. 11/20/17   Maxwell Caul, PA-C  pantoprazole (PROTONIX) 40 MG tablet TAKE 1 TABLET BY MOUTH EVERY DAY Patient taking differently: Take 40 mg by mouth daily.  04/29/18   Hilty, Lisette Abu, MD  polyvinyl alcohol (ARTIFICIAL TEARS) 1.4 % ophthalmic solution Place 1-2 drops into both eyes daily as needed for dry eyes.    [provider]  warfarin (COUMADIN) 5 MG tablet TAKE 1/2 TO 1 TABLET BY MOUTH DAILY OR AS DIRECTED BY COUMADIN CLINIC Patient taking differently: TAKE 1/2 TO 1 TABLET BY MOUTH DAILY OR AS DIRECTED BY COUMADIN CLINIC 03/18/18   Hilty, Lisette Abu, MD    Social History   Socioeconomic History  . Marital status: Single    Spouse name: Not on file  . Number of  children: 2  . Years of education: 78  . Highest education level: Not on file  Occupational History  . Occupation: Distribution center    Employer: [employer]  Social Needs  . Financial resource strain: Not on file  . Food insecurity:    Worry: Not on file  Inability: Not on file  . Transportation needs:    Medical: Not on file    Non-medical: Not on file  Tobacco Use  . Smoking status: Never Smoker  . Smokeless tobacco: Never Used  Substance and Sexual Activity  . Alcohol use: No  . Drug use: No  . Sexual activity: Not on file  Lifestyle  . Physical activity:    Days per week: Not on file    Minutes per session: Not on file  . Stress: Not on file  Relationships  . Social connections:    Talks on phone: Not on file    Gets together: Not on file    Attends religious service: Not on file    Active member of club or organization: Not on file    Attends meetings of clubs or organizations: Not on file    Relationship status: Not on file  . Intimate partner violence:    Fear of current or ex partner: Not on file    Emotionally abused: Not on file    Physically abused: Not on file    Forced sexual activity: Not on file  Other Topics Concern  . Not on file  Social History Narrative  . Not on file     Family History  Problem Relation Age of Onset  . Hypertension Mother   . Colon cancer Neg Hx   . Esophageal cancer Neg Hx   . Pancreatic cancer Neg Hx   . Stomach cancer Neg Hx   . Liver disease Neg Hx     ROS: [x]  Positive   [ ]  Negative   [ ]  All sytems reviewed and are negative  Cardiac: [x]  hx CABG/replacement asc aorta, AVR (mechanical)  Vascular: []  pain in her left groin and swelling  Pulmonary: []  productive cough []  asthma/wheezing []  home O2  Neurologic: []  weakness in []  arms []  legs []  numbness in []  arms []  legs []  hx of CVA []  mini stroke [] difficulty speaking or slurred speech []  temporary loss of vision in one eye []   dizziness  Hematologic: []  hx of cancer []  bleeding problems []  problems with blood clotting easily  Endocrine:   []  diabetes []  thyroid disease  GI []  vomiting blood []  blood in stool  GU: []  CKD/renal failure []  HD--[]  M/W/F or []  T/T/S []  burning with urination [x]  blood in urine  Psychiatric: []  anxiety []  depression  Musculoskeletal: []  arthritis []  joint pain [x]  RA  Integumentary: []  rashes []  ulcers  Constitutional: []  fever []  chills   Physical Examination  Vitals:   06/07/18 1216 06/07/18 1249  BP: 92/64 (!) 117/52  Pulse: 86 86  Resp: 16 (!) 22  Temp: 98.2 F (36.8 C)   SpO2: 99% 98%   There is no height or weight on file to calculate BMI.  General:  WDWN in NAD Gait: Not observed HENT: WNL, normocephalic Pulmonary: normal non-labored breathing, without Rales, rhonchi,  wheezing Cardiac: regular/irregular, without  Murmurs, rubs or gallops; with carotid bruits Abdomen:  soft, NT/ND, no masses Skin: with ecchymosis on abdomen (Lovenox); ecchymosis right thigh Vascular Exam/Pulses:  Right Left  Radial 1+ (weak) 1+ (weak)  Ulnar Unable to palpate  Unable to palpate   Femoral 2+ (normal) Pulsatile psa   DP 2+ (normal) 2+ (normal)  PT Unable to palpate  Unable to palpate    Extremities: without ischemic changes, without Gangrene , without cellulitis; without open wounds;  Musculoskeletal: no muscle wasting or atrophy  Neurologic: A&O X 3;  No focal weakness or paresthesias are detected; speech is fluent/normal Psychiatric:  The pt has Normal affect.   CBC    Component Value Date/Time   WBC 7.9 05/24/2018 0242   RBC 4.10 05/24/2018 0242   HGB 10.7 (L) 05/24/2018 0242   HCT 33.7 (L) 05/24/2018 0242   PLT 147 (L) 05/24/2018 0242   MCV 82.2 05/24/2018 0242   MCH 26.1 05/24/2018 0242   MCHC 31.8 05/24/2018 0242   RDW 16.8 (H) 05/24/2018 0242   LYMPHSABS 0.8 08/15/2016 1013   MONOABS 0.6 08/15/2016 1013   EOSABS 0.2 08/15/2016  1013   BASOSABS 0.0 08/15/2016 1013    BMET    Component Value Date/Time   NA 142 05/24/2018 0242   K 3.6 05/24/2018 0242   CL 110 05/24/2018 0242   CO2 26 05/24/2018 0242   GLUCOSE 99 05/24/2018 0242   BUN 13 05/24/2018 0242   CREATININE 0.88 05/24/2018 0242   CREATININE 0.77 07/14/2015 1615   CALCIUM 9.2 05/24/2018 0242   GFRNONAA >60 05/24/2018 0242   GFRAA >60 05/24/2018 0242    COAGS: Lab Results  Component Value Date   INR 2.1 05/27/2018   INR 1.43 05/24/2018   INR 1.41 05/23/2018     Non-Invasive Vascular Imaging:   Groin u/s 06/07/18: Left femoral psa measuring 2.6 x 2.8cm with 44mm neck that is 5mm wide.  Statin:  Yes.   Beta Blocker:  No. Aspirin:  Yes.   ACEI:  Yes.   ARB:  No. CCB use:  No Other antiplatelets/anticoagulants:  Yes.   coumadin/plavix   ASSESSMENT/PLAN: This is a 67 y.o. female s/p cardiac catheterization now with left groin psa and EKG changes   -pt being admitted for EKG changes and hematuria. -pt seen by Dr. Edilia Bo in ER and recommends compression and possibly thrombin injection.  Would plan to do this as an outpatient, but pt is being admitted per cardiology.  She will need to be off of her coumadin for the injection and most likely bridged with Lovenox given her mechanical AVR.  Could possibly plan for this on Monday or Tuesday pending INR given that she will be inpatient.   -carotid bruits heard-she is followed by cardiology with carotid duplex regularly.   Doreatha Massed, PA-C [facility] [phone]

## 2018-06-07 NOTE — ED Provider Notes (Addendum)
[facility] EMERGENCY DEPARTMENT Provider Note   CSN: [id number] Arrival date & time: 06/07/18  1202     History   Chief Complaint Chief Complaint  Patient presents with  . Chest Pain  . Hematuria  . Post-op Problem    HPI [name] is a 67 y.o. female history of CAD status post CABG, stent, hypertension, CHF with a EF of 40%, aortic valve replacement on Coumadin, here presenting with left groin pain, chest pain, EKG changes.  Patient had cardiac catheterization on 8/20 and had a PCI of L main.  She was told to continue Coumadin and start aspirin and Plavix for a month.  Patient noted persistent left groin pain.  Also has some persistent chest pain and shortness of breath.  Patient went to cardiology clinic today and was noted to have some EKG changes and was sent for admission.  She also had an ultrasound done outpatient that showed a small left pseudoaneurysm and was sent to see vascular surgery as well.  Patient also has been having hematuria over the last several days but denies any flank pain or vomiting.  The history is provided by the patient.    Past Medical History:  Diagnosis Date  . Ascending aortic aneurysm (HCC)    a.  redo replacement of fusiform aneurysm of ascending aorta (complicated by pericardial hematoma s/p evacuation) in 2014.  Marland Kitchen Contrast media allergy   . Coronary artery disease    a. s/p CABG x2 in 2014. b. s/p DES to SVG-LAD in 2017.  . H/O mechanical aortic valve replacement   . Hyperlipidemia   . Hypertension   . Ischemic cardiomyopathy    a. EF 40-45% in 2017 at time of NSTEMI, improved to normal in 2018.  . Non-STEMI (non-ST elevated myocardial infarction) (HCC)   . Premature atrial contractions   . PVC's (premature ventricular contractions)   . RA (rheumatoid arthritis) (HCC)   . S/P AVR (aortic valve replacement) 08/1999   St. Jude AVR for aortic insuff, Goal INR 2.5-3.5  . 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 SVG to LAD, SVG to  Cfx; re-do replacement of fusiform aneursym of ascending thoracic aorta (Dr. Donata Clay) - complicated by pericardial hematoma --> subxiphoid pericardial exposure and evacuation b. Cath 06/2016: DES to SVG-LAD ostium  . Sarcoid   . Second degree AV block    a. ? possibly seen in 12/2016 clinic visit - EKG not truly suggestive  . Severe anemia   . 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 s/p left subclavian bypass.    Patient Active Problem List   Diagnosis Date Noted  . 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 CAD in native artery 05/17/2018  . 2nd degree AV block 01/12/2017  . DOE (dyspnea on exertion) 01/12/2017  . Encounter for long-term (current) use of medications 01/12/2017  . Acute blood loss anemia 08/15/2016  . Black stools 08/15/2016  . Aortic valve disease 07/03/2016  . Cardiomyopathy, ischemic 07/03/2016  . Hx of CABG   . NSTEMI (non-ST elevated myocardial infarction) (HCC) 06/20/2016  . Acute pericardial effusion post op without tamponade - S/P subxiphoid pericaridal window 11/21/12 11/21/2012  . PVD , known Lt SCA stenosis 11/21/2012  . Pleural effusion, RT, post op 11/21/2012  . Unstable angina on admission 11/04/12 11/04/2012  . Coronary artery disease involving native coronary artery of native heart without angina pectoris 11/04/2012  . S/P AVR (aortic valve replacement) 11/04/2012  . Dilated ascending aorta at cath  11/03/12 - S/P root replacement 11/05/12 11/04/2012  . Chronic anticoagulation for mechanical AVR 11/04/2012  . Contrast media allergy 11/04/2012  . Essential hypertension 11/04/2012  . Sarcoidosis 11/04/2012    Past Surgical History:  Procedure Laterality Date  . AORTIC VALVE REPLACEMENT  08/23/1999   62mm St. Jude, [place] procedure, root replacemtn   . BYPASS GRAFT ANGIOGRAPHY  05/20/2018   Procedure: BYPASS GRAFT ANGIOGRAPHY;  Surgeon: Corky Crafts, MD;  Location: [facility] INVASIVE CV LAB;  Service: Cardiovascular;;  . CARDIAC CATHETERIZATION  08/1999    normal L main, LAD & first diagonal are normal, normal Cfx, RCA with mild ostial narrowing, normal LV systolic function, severe AI, mild MR, normal PAP (Dr. Mervyn Skeeters. Little)   . CARDIAC CATHETERIZATION N/A 06/22/2016   Procedure: Left Heart Cath and Cors/Grafts Angiography;  Surgeon: Marykay Lex, MD;  Location: [facility] INVASIVE CV LAB;  Service: Cardiovascular;  Laterality: N/A;  . CARDIAC CATHETERIZATION N/A 06/23/2016   Procedure: Left Heart Cath and Coronary Angiography;  Surgeon: Tonny Bollman, MD;  Location: [facility] INVASIVE CV LAB;  Service: Cardiovascular;  Laterality: N/A;  . CARDIAC CATHETERIZATION N/A 06/23/2016   Procedure: Coronary Stent Intervention;  Surgeon: Tonny Bollman, MD;  Location: [facility] INVASIVE CV LAB;  Service: Cardiovascular;  Laterality: N/A;  . CORONARY ARTERY BYPASS GRAFT  11/05/2012   Procedure: CORONARY ARTERY BYPASS GRAFTING (CABG);  Surgeon: Kerin Perna, MD;  Location: [facility] OR;  Service: Open Heart Surgery;  Laterality: N/A;  cannulate right subclavian  . CORONARY STENT INTERVENTION N/A 05/21/2018   Procedure: CORONARY STENT INTERVENTION;  Surgeon: Corky Crafts, MD;  Location: [facility] INVASIVE CV LAB;  Service: Cardiovascular;  Laterality: N/A;  . ENDOVEIN HARVEST OF GREATER SAPHENOUS VEIN  11/05/2012   Procedure: ENDOVEIN HARVEST OF GREATER SAPHENOUS VEIN;  Surgeon: Kerin Perna, MD;  Location: [facility] OR;  Service: Open Heart Surgery;  Laterality: Right;  . IABP INSERTION N/A 05/21/2018   Procedure: IABP INSERTION;  Surgeon: Corky Crafts, MD;  Location: [facility] INVASIVE CV LAB;  Service: Cardiovascular;  Laterality: N/A;  . INTRAOPERATIVE TRANSESOPHAGEAL ECHOCARDIOGRAM  11/05/2012   Procedure: INTRAOPERATIVE TRANSESOPHAGEAL ECHOCARDIOGRAM;  Surgeon: Kerin Perna, MD;  Location: [facility] OR;  Service: Open Heart Surgery;  Laterality: N/A;  . INTRAOPERATIVE TRANSESOPHAGEAL ECHOCARDIOGRAM N/A 11/21/2012   Procedure: INTRAOPERATIVE TRANSESOPHAGEAL ECHOCARDIOGRAM;  Surgeon: Kerin Perna, MD;  Location: [facility] OR;  Service: Open Heart Surgery;  Laterality: N/A;  . LEFT HEART CATHETERIZATION WITH CORONARY ANGIOGRAM N/A 11/04/2012   Procedure: LEFT HEART CATHETERIZATION WITH CORONARY ANGIOGRAM;  Surgeon: Chrystie Nose, MD;  Location: [facility] CATH LAB;  Service: Cardiovascular;  Laterality: N/A;  . MEDIASTINOSCOPY  08/2005   bronchoscopy & mediastinoscopy for mediastinal adenopathy (Dr. Donata Clay)   . NM MYOCAR PERF WALL MOTION  01/2012   lexiscan; small, fixed apical septal breast attenuation artifact & inferolateral bowel attenuation, no reversible ischemia, post-stress EF 64%, low risk scan   . PERICARDIAL WINDOW N/A 11/21/2012   Procedure: PERICARDIAL WINDOW;  Surgeon: Kerin Perna, MD;  Location: [facility] OR;  Service: Thoracic;  Laterality: N/A;  . REPLACEMENT ASCENDING AORTA  11/05/2012   Procedure: REPLACEMENT ASCENDING AORTA;  Surgeon: Kerin Perna, MD;  Location: [facility] OR;  Service: Open Heart Surgery;  Laterality: N/A;  . SUBCLAVIAN ANGIOGRAM  05/10/2000   left subclavian arteriogram; patent graft, mild stenosis (Dr. Amada Kingfisher)   . SUBCLAVIAN BYPASS GRAFT  05/16/1999   left subclavian bypass with 45mm Darcon graft (Dr. Amada Kingfisher)  .  TRANSTHORACIC ECHOCARDIOGRAM  11/20/2012   EF 65-70%, mild conc LVH, grade 2 diastolic dysfunction, mech AV, mild MR     OB History   None      Home Medications    Prior to Admission medications   Medication Sig Start Date End Date Taking? Authorizing Provider  acetaminophen (TYLENOL) 500 MG tablet Take 500-1,000 mg by mouth every 6 (six) hours as needed (pain).   Yes [provider]  aspirin EC 81 MG EC tablet Take 1 tablet (81 mg total) by mouth daily. Patient taking differently: Take 81 mg by mouth daily at 6 PM.  06/26/16  Yes Strader, Lennart Pall, PA-C  atorvastatin (LIPITOR) 80 MG tablet TAKE 1 TABLET (80 MG TOTAL) BY MOUTH DAILY AT 6 PM. 12/18/17  Yes Hilty, Lisette Abu, MD  Calcium Carb-Cholecalciferol (CALCIUM 600+D3 PO) Take 1  tablet by mouth daily.    Yes [provider]  clopidogrel (PLAVIX) 75 MG tablet TAKE 1 TABLET BY MOUTH DAILY WITH BREAKFAST Patient taking differently: Take 75 mg by mouth daily.  02/14/18  Yes Hilty, Lisette Abu, MD  Diclofenac Sodium 3 % GEL Place 1 application onto the skin 2 (two) times daily. To affected area. Patient taking differently: Place 1 application onto the skin 2 (two) times daily as needed (arthritis pain).  12/29/16  Yes Everlene Farrier, PA-C  isosorbide mononitrate (IMDUR) 30 MG 24 hr tablet TAKE 1 TABLET BY MOUTH EVERY DAY Patient taking differently: Take 30 mg by mouth daily.  11/15/17  Yes Barrett, Joline Salt, PA-C  lisinopril (PRINIVIL,ZESTRIL) 2.5 MG tablet Take 1 tablet (2.5 mg total) by mouth daily. 05/25/18  Yes Georgie Chard D, NP  Multiple Vitamin (MULTIVITAMIN WITH MINERALS) TABS tablet Take 1 tablet by mouth daily.   Yes [provider]  nitroGLYCERIN (NITROSTAT) 0.4 MG SL tablet PLACE 1 TABLET (0.4 MG TOTAL) UNDER THE TONGUE EVERY 5 (FIVE) MINUTES AS NEEDED FOR CHEST PAIN. 02/13/18  Yes Hilty, Lisette Abu, MD  ondansetron (ZOFRAN) 4 MG tablet Take 1 tablet (4 mg total) by mouth every 6 (six) hours. Patient taking differently: Take 2-4 mg by mouth daily.  11/20/17  Yes Graciella Freer A, PA-C  pantoprazole (PROTONIX) 40 MG tablet TAKE 1 TABLET BY MOUTH EVERY DAY Patient taking differently: Take 40 mg by mouth daily.  04/29/18  Yes Hilty, Lisette Abu, MD  polyvinyl alcohol (ARTIFICIAL TEARS) 1.4 % ophthalmic solution Place 1-2 drops into both eyes daily as needed for dry eyes.   Yes [provider]  warfarin (COUMADIN) 5 MG tablet TAKE 1/2 TO 1 TABLET BY MOUTH DAILY OR AS DIRECTED BY COUMADIN CLINIC Patient taking differently: Take 2.5-5 mg by mouth See admin instructions. Take 1/2 tablet (2.5 mg) by mouth on Monday and Thursday at 6pm, take 1 tablet (5 mg) on Sunday, Tuesday, Wednesday, Friday, Saturday at 6pm 03/18/18  Yes Hilty, Lisette Abu, MD  diazepam  (VALIUM) 2 MG tablet Take 1 tablet (2 mg total) by mouth every 8 (eight) hours as needed for muscle spasms. Patient not taking: Reported on 06/07/2018 12/29/16   Everlene Farrier, PA-C  enoxaparin (LOVENOX) 80 MG/0.8ML injection Inject 0.8 mLs (80 mg total) into the skin every 12 (twelve) hours for 7 doses. Patient not taking: Reported on 06/07/2018 05/24/18 06/07/18  Filbert Schilder, NP  fluticasone [facility]) 50 MCG/ACT nasal spray Place 1 spray into both nostrils daily. Patient not taking: Reported on 06/07/2018 11/20/17   Rosana Hoes    Family History Family History  Problem Relation Age of Onset  . Hypertension Mother   . Colon cancer Neg Hx   . Esophageal cancer Neg Hx   . Pancreatic cancer Neg Hx   . Stomach cancer Neg Hx   . Liver disease Neg Hx     Social History Social History   Tobacco Use  . Smoking status: Never Smoker  . Smokeless tobacco: Never Used  Substance Use Topics  . Alcohol use: No  . Drug use: No     Allergies   Contrast media [iodinated diagnostic agents]   Review of Systems Review of Systems  Cardiovascular: Positive for chest pain.  Genitourinary: Positive for hematuria.  All other systems reviewed and are negative.    Physical Exam Updated Vital Signs BP 102/66 (BP Location: Right Arm)   Pulse 97   Temp 99 F (37.2 C) (Oral)   Resp 14   Ht 5\' 7"  (1.702 m)   Wt 70.7 kg   SpO2 99%   BMI 24.40 kg/m   Physical Exam  Constitutional: She is oriented to person, place, and time.  Chronically ill   HENT:  Head: Normocephalic.  Eyes: Pupils are equal, round, and reactive to light. EOM are normal.  Neck: Normal range of motion. Neck supple.  Cardiovascular: Normal rate, regular rhythm and normal pulses.  Pulmonary/Chest: Effort normal and breath sounds normal.  Abdominal: Soft. Bowel sounds are normal.  Musculoskeletal:  L groin with ecchymosis and pulsatile mass. 2+ DP pulses bilaterally   Neurological: She is alert and oriented to  person, place, and time.  Skin: Skin is warm. Capillary refill takes less than 2 seconds.  Psychiatric: She has a normal mood and affect. Her behavior is normal.  Nursing note and vitals reviewed.    ED Treatments / Results  Labs (all labs ordered are listed, but only abnormal results are displayed) Labs Reviewed  CBC - Abnormal; Notable for the following components:      Result Value   RBC 3.76 (*)    Hemoglobin 9.8 (*)    HCT 31.4 (*)    RDW 17.6 (*)    All other components within normal limits  COMPREHENSIVE METABOLIC PANEL - Abnormal; Notable for the following components:   Glucose, Bld 107 (*)    Creatinine, Ser 1.14 (*)    Total Protein 6.4 (*)    Albumin 3.2 (*)    GFR calc non Af Amer 49 (*)    GFR calc Af Amer 57 (*)    All other components within normal limits  URINALYSIS, ROUTINE W REFLEX MICROSCOPIC - Abnormal; Notable for the following components:   APPearance CLOUDY (*)    Hgb urine dipstick LARGE (*)    Protein, ur 100 (*)    Nitrite POSITIVE (*)    Leukocytes, UA LARGE (*)    RBC / HPF >50 (*)    WBC, UA >50 (*)    Bacteria, UA FEW (*)    All other components within normal limits  PROTIME-INR - Abnormal; Notable for the following components:   Prothrombin Time 23.3 (*)    All other components within normal limits  CBC WITH DIFFERENTIAL/PLATELET - Abnormal; Notable for the following components:   RBC 3.50 (*)    Hemoglobin 9.1 (*)    HCT 28.6 (*)    RDW 17.5 (*)    Lymphs Abs 0.5 (*)    All other components within normal limits  URINE CULTURE  TROPONIN I  PROTIME-INR  TROPONIN I  TROPONIN I  BASIC METABOLIC PANEL  LIPID PANEL  CBC  PROTIME-INR  I-STAT TROPONIN, ED    EKG EKG Interpretation  Date/Time:  Friday June 07 2018 12:11:07 EDT Ventricular Rate:  79 PR Interval:  144 QRS Duration: 104 QT Interval:  352 QTC Calculation: 403 R Axis:   58 Text Interpretation:  Normal sinus rhythm Left ventricular hypertrophy with  repolarization abnormality Cannot rule out Inferior infarct , age undetermined Abnormal ECG TWI new since previous Confirmed by Richardean Canal [phone]) on 06/07/2018 5:18:53 PM   Radiology Dg Chest 2 View  Result Date: 06/07/2018 CLINICAL DATA:  Pt had a catheterization of her left groin x 2 weeks ago, and x 1 week has noted increasing swelling, pain and ocasional bleeding to the area of insertion EXAM: CHEST - 2 VIEW COMPARISON:  05/16/2018 FINDINGS: Stable changes from prior cardiac surgery. Cardiac silhouette is mildly enlarged. No mediastinal or hilar masses. No evidence of adenopathy. Diffuse aortic ectasia and atherosclerosis. Clear lungs.  No pleural effusion or pneumothorax. Skeletal structures are intact. IMPRESSION: No active cardiopulmonary disease. Electronically Signed   By: Amie Portland M.D.   On: 06/07/2018 16:27    Procedures Procedures (including critical care time)  CRITICAL CARE Performed by: Richardean Canal   Total critical care time:  Critical care time was exclusive of separately billable procedures and treating other patients.  Critical care was necessary to treat or prevent imminent or life-threatening deterioration.  Critical care was time spent personally by me on the following activities: development of treatment plan with patient and/or surrogate as well as nursing, discussions with consultants, evaluation of patient's response to treatment, examination of patient, obtaining history from patient or surrogate, ordering and performing treatments and interventions, ordering and review of laboratory studies, ordering and review of radiographic studies, pulse oximetry and re-evaluation of patient's condition.  Angiocath insertion Performed by: Richardean Canal  Consent: Verbal consent obtained. Risks and benefits: risks, benefits and alternatives were discussed Time out: Immediately prior to procedure a "time out" was called to verify the correct patient, procedure,  equipment, support staff and site/side marked as required.  Preparation: Patient was prepped and draped in the usual sterile fashion.  Vein Location: L arm  Ultrasound Guided  Gauge: 20 long   Normal blood return and flush without difficulty Patient tolerance: Patient tolerated the procedure well with no immediate complications.     Medications Ordered in ED Medications  aspirin EC tablet 81 mg (81 mg Oral Given 06/07/18 2022)  atorvastatin (LIPITOR) tablet 80 mg (80 mg Oral Given 06/07/18 2021)  isosorbide mononitrate (IMDUR) 24 hr tablet 30 mg (30 mg Oral Given 06/07/18 2022)  pantoprazole (PROTONIX) EC tablet 40 mg (40 mg Oral Given 06/07/18 2022)  nitroGLYCERIN (NITROSTAT) SL tablet 0.4 mg (has no administration in time range)  acetaminophen (TYLENOL) tablet 650 mg (has no administration in time range)  ondansetron (ZOFRAN) injection 4 mg (has no administration in time range)  clopidogrel (PLAVIX) tablet 75 mg (75 mg Oral Given 06/07/18 2022)  cefTRIAXone (ROCEPHIN) 1 g in sodium chloride 0.9 % 100 mL IVPB (0 g Intravenous Stopped 06/07/18 1526)     Initial Impression / Assessment and Plan / ED Course  I have reviewed the triage vital signs and the nursing notes.  Pertinent labs & imaging results that were available during my care of the patient were reviewed by me and considered in my medical decision making (see chart for details).     Normandy Zittel is a  67 y.o. female here with chest pain, hematuria, L groin pseudoaneurysm. Patient is on coumadin currently and had confirmed L pseudoaneurysm on outpatient [place]. She also has EKG changes since the cath. Will get labs, trop. Will consult vascular surgery.   2 pm Vascular surgery saw patient and felt that the pseudoaneurysm is small and will need injection but wants to wait until patient is off coumadin for several days. UA + UTI, given rocephin. Hg stable. Trop negative. Consulted cardiology to see patient since patient was sent by  cardiology for admission.   6:30 pm Cardiology to admit.    Final Clinical Impressions(s) / ED Diagnoses   Final diagnoses:  Pseudoaneurysm [facility])  Other chest pain    ED Discharge Orders    None       Charlynne Pander, MD 06/07/18 2220    Charlynne Pander, MD 06/07/18 2326

## 2018-06-07 NOTE — H&P (Signed)
History & Physical    Patient ID: Tijah Hane MRN: [id number], DOB/AGE: Apr 23, 1951   Admit date: 06/07/2018  Primary Physician: [name], MD Primary Cardiologist: Dr. Zoila Shutter, MD   Patient Profile    [name] is a 67 y.o. female with a hx of HTN, CAD, dilated ascending aorta at cath 11/03/12 s/p root and mechanical AVR 11/05/12 and CAD status post with SVG to LAD and SVG to circumflex,  complicated by pericardial effusion without tamponade requiring subxiphoid window, subclavian artery stenosis status post bypass in 2001, NSTEMI in 2017 with ischemic cardiomyopathy with an EF of 40 to 45%, heart cath resulting in PCI of the SVG to LAD, EF normalized in 2018.  She also has a history of sarcoidosis dyslipidemia, and contrast allergy seen in the office on 06/07/18 with post cath groin concerns found to have pseudoaneurysm by ultrasound.   Past Medical History   Past Medical History:  Diagnosis Date  . Ascending aortic aneurysm (HCC)    a.  redo replacement of fusiform aneurysm of ascending aorta (complicated by pericardial hematoma s/p evacuation) in 2014.  Marland Kitchen Contrast media allergy   . Coronary artery disease    a. s/p CABG x2 in 2014. b. s/p DES to SVG-LAD in 2017.  . H/O mechanical aortic valve replacement   . Hyperlipidemia   . Hypertension   . Ischemic cardiomyopathy    a. EF 40-45% in 2017 at time of NSTEMI, improved to normal in 2018.  . Non-STEMI (non-ST elevated myocardial infarction) (HCC)   . Premature atrial contractions   . PVC's (premature ventricular contractions)   . RA (rheumatoid arthritis) (HCC)   . S/P AVR (aortic valve replacement) 08/1999   St. Jude AVR for aortic insuff, Goal INR 2.5-3.5  . 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 Donata Clay) - complicated by pericardial hematoma --> subxiphoid pericardial exposure and evacuation b. Cath 06/2016: DES to SVG-LAD ostium   . Sarcoid   . Second degree AV block    a. ? possibly seen in 12/2016 clinic visit - EKG not truly suggestive  . Severe anemia   . 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 s/p left subclavian bypass.    Past Surgical History:  Procedure Laterality Date  . AORTIC VALVE REPLACEMENT  08/23/1999   23mm St. Jude, Bentall procedure, root replacemtn   . BYPASS GRAFT ANGIOGRAPHY  05/20/2018   Procedure: BYPASS GRAFT ANGIOGRAPHY;  Surgeon: Corky Crafts, MD;  Location: [facility] INVASIVE CV LAB;  Service: Cardiovascular;;  . CARDIAC CATHETERIZATION  08/1999   normal L main, LAD & first diagonal are normal, normal Cfx, RCA with mild ostial narrowing, normal LV systolic function, severe AI, mild MR, normal PAP (Dr. Mervyn Skeeters. Little)   . CARDIAC CATHETERIZATION N/A 06/22/2016   Procedure: Left Heart Cath and Cors/Grafts Angiography;  Surgeon: Marykay Lex, MD;  Location: [facility] INVASIVE CV LAB;  Service: Cardiovascular;  Laterality: N/A;  . CARDIAC CATHETERIZATION N/A 06/23/2016   Procedure: Left Heart Cath and Coronary Angiography;  Surgeon: Tonny Bollman, MD;  Location: [facility] INVASIVE CV LAB;  Service: Cardiovascular;  Laterality: N/A;  . CARDIAC CATHETERIZATION N/A 06/23/2016   Procedure: Coronary Stent Intervention;  Surgeon: Tonny Bollman, MD;  Location: [facility] INVASIVE CV LAB;  Service: Cardiovascular;  Laterality: N/A;  . CORONARY ARTERY BYPASS GRAFT  11/05/2012   Procedure: CORONARY ARTERY BYPASS GRAFTING (CABG);  Surgeon: Theron Arista  Donata Clay, MD;  Location: [facility] OR;  Service: Open Heart Surgery;  Laterality: N/A;  cannulate right subclavian  . CORONARY STENT INTERVENTION N/A 05/21/2018   Procedure: CORONARY STENT INTERVENTION;  Surgeon: Corky Crafts, MD;  Location: [facility] INVASIVE CV LAB;  Service: Cardiovascular;  Laterality: N/A;  . ENDOVEIN HARVEST OF GREATER SAPHENOUS VEIN  11/05/2012   Procedure: ENDOVEIN HARVEST OF GREATER SAPHENOUS VEIN;  Surgeon: Kerin Perna, MD;  Location: [facility] OR;  Service: Open  Heart Surgery;  Laterality: Right;  . IABP INSERTION N/A 05/21/2018   Procedure: IABP INSERTION;  Surgeon: Corky Crafts, MD;  Location: [facility] INVASIVE CV LAB;  Service: Cardiovascular;  Laterality: N/A;  . INTRAOPERATIVE TRANSESOPHAGEAL ECHOCARDIOGRAM  11/05/2012   Procedure: INTRAOPERATIVE TRANSESOPHAGEAL ECHOCARDIOGRAM;  Surgeon: Kerin Perna, MD;  Location: [facility] OR;  Service: Open Heart Surgery;  Laterality: N/A;  . INTRAOPERATIVE TRANSESOPHAGEAL ECHOCARDIOGRAM N/A 11/21/2012   Procedure: INTRAOPERATIVE TRANSESOPHAGEAL ECHOCARDIOGRAM;  Surgeon: Kerin Perna, MD;  Location: [facility] OR;  Service: Open Heart Surgery;  Laterality: N/A;  . LEFT HEART CATHETERIZATION WITH CORONARY ANGIOGRAM N/A 11/04/2012   Procedure: LEFT HEART CATHETERIZATION WITH CORONARY ANGIOGRAM;  Surgeon: Chrystie Nose, MD;  Location: [facility] CATH LAB;  Service: Cardiovascular;  Laterality: N/A;  . MEDIASTINOSCOPY  08/2005   bronchoscopy & mediastinoscopy for mediastinal adenopathy (Dr. Donata Clay)   . NM MYOCAR PERF WALL MOTION  01/2012   lexiscan; small, fixed apical septal breast attenuation artifact & inferolateral bowel attenuation, no reversible ischemia, post-stress EF 64%, low risk scan   . PERICARDIAL WINDOW N/A 11/21/2012   Procedure: PERICARDIAL WINDOW;  Surgeon: Kerin Perna, MD;  Location: [facility] OR;  Service: Thoracic;  Laterality: N/A;  . REPLACEMENT ASCENDING AORTA  11/05/2012   Procedure: REPLACEMENT ASCENDING AORTA;  Surgeon: Kerin Perna, MD;  Location: [facility] OR;  Service: Open Heart Surgery;  Laterality: N/A;  . SUBCLAVIAN ANGIOGRAM  05/10/2000   left subclavian arteriogram; patent graft, mild stenosis (Dr. Amada Kingfisher)   . SUBCLAVIAN BYPASS GRAFT  05/16/1999   left subclavian bypass with 7mm Darcon graft (Dr. Amada Kingfisher)  . TRANSTHORACIC ECHOCARDIOGRAM  11/20/2012   EF 65-70%, mild conc LVH, grade 2 diastolic dysfunction, mech AV, mild MR    Allergies Allergies  Allergen Reactions  . Contrast Media [Iodinated  Diagnostic Agents] Hives, Itching and Swelling   History of Present Illness    [name] is a 67 y.o. female with a hx of HTN, CAD, dilated ascending aorta at cath 11/03/12 s/p root and mechanical AVR 11/05/12 and CAD status post with SVG to LAD and SVG to circumflex,  complicated by pericardial effusion without tamponade requiring subxiphoid window, subclavian artery stenosis status post bypass in 2001, NSTEMI in 2017 with ischemic cardiomyopathy with an EF of 40 to 45%, heart cath resulting in PCI of the SVG to LAD, EF normalized in 2018.  She also has a history of sarcoidosis dyslipidemia, and contrast allergy.  Beta-blocker had been stopped on her previous visit due to what appears to be second-degree heart block.  She presented to the ER on 05/16/2018 with anginal chest pain and an elevated troponin of 0.11.  Admission was recommended but she declined.  She returns for admission on 05/17/2018.  Heart cath on 05/20/2018 with restenosis of the SVG ostium to the LAD.  Unable to engage the graft, staged PCI was planned.  On 05/21/2018, the patient returned to the Cath Lab for PCI of her left main.  She was discharged with  plans for Coumadin aspirin and Plavix for 1 month and then stopping aspirin.  She returned to the office on 06/07/18 for a work-in appt stating she was having problems with her groin site. Left groin was noted to have a hard lump that is pulsatile. She also reported mild hematuria since last week. She had no complaint of anginal symptoms and reports compliance with all medications.   Ultrasound in the office on 06/07/18 confirmed presence of pseudoaneurysm.   Home Medications    Prior to Admission medications   Medication Sig Start Date End Date Taking? Authorizing Provider  acetaminophen (TYLENOL) 500 MG tablet Take 500-1,000 mg by mouth every 6 (six) hours as needed (pain).   Yes [provider]  aspirin EC 81 MG EC tablet Take 1 tablet (81 mg total) by mouth  daily. Patient taking differently: Take 81 mg by mouth daily at 6 PM.  06/26/16  Yes Strader, Lennart Pall, PA-C  atorvastatin (LIPITOR) 80 MG tablet TAKE 1 TABLET (80 MG TOTAL) BY MOUTH DAILY AT 6 PM. 12/18/17  Yes Hilty, Lisette Abu, MD  Calcium Carb-Cholecalciferol (CALCIUM 600+D3 PO) Take 1 tablet by mouth daily.    Yes [provider]  clopidogrel (PLAVIX) 75 MG tablet TAKE 1 TABLET BY MOUTH DAILY WITH BREAKFAST Patient taking differently: Take 75 mg by mouth daily.  02/14/18  Yes Hilty, Lisette Abu, MD  Diclofenac Sodium 3 % GEL Place 1 application onto the skin 2 (two) times daily. To affected area. Patient taking differently: Place 1 application onto the skin 2 (two) times daily as needed (arthritis pain).  12/29/16  Yes Everlene Farrier, PA-C  isosorbide mononitrate (IMDUR) 30 MG 24 hr tablet TAKE 1 TABLET BY MOUTH EVERY DAY Patient taking differently: Take 30 mg by mouth daily.  11/15/17  Yes Barrett, Joline Salt, PA-C  lisinopril (PRINIVIL,ZESTRIL) 2.5 MG tablet Take 1 tablet (2.5 mg total) by mouth daily. 05/25/18  Yes Georgie Chard D, NP  Multiple Vitamin (MULTIVITAMIN WITH MINERALS) TABS tablet Take 1 tablet by mouth daily.   Yes [provider]  nitroGLYCERIN (NITROSTAT) 0.4 MG SL tablet PLACE 1 TABLET (0.4 MG TOTAL) UNDER THE TONGUE EVERY 5 (FIVE) MINUTES AS NEEDED FOR CHEST PAIN. 02/13/18  Yes Hilty, Lisette Abu, MD  ondansetron (ZOFRAN) 4 MG tablet Take 1 tablet (4 mg total) by mouth every 6 (six) hours. Patient taking differently: Take 2-4 mg by mouth daily.  11/20/17  Yes Graciella Freer A, PA-C  pantoprazole (PROTONIX) 40 MG tablet TAKE 1 TABLET BY MOUTH EVERY DAY Patient taking differently: Take 40 mg by mouth daily.  04/29/18  Yes Hilty, Lisette Abu, MD  polyvinyl alcohol (ARTIFICIAL TEARS) 1.4 % ophthalmic solution Place 1-2 drops into both eyes daily as needed for dry eyes.   Yes [provider]  warfarin (COUMADIN) 5 MG tablet TAKE 1/2 TO 1 TABLET BY MOUTH DAILY OR AS  DIRECTED BY COUMADIN CLINIC Patient taking differently: Take 2.5-5 mg by mouth See admin instructions. Take 1/2 tablet (2.5 mg) by mouth on Monday and Thursday at 6pm, take 1 tablet (5 mg) on Sunday, Tuesday, Wednesday, Friday, Saturday at 6pm 03/18/18  Yes Hilty, Lisette Abu, MD  diazepam (VALIUM) 2 MG tablet Take 1 tablet (2 mg total) by mouth every 8 (eight) hours as needed for muscle spasms. Patient not taking: Reported on 06/07/2018 12/29/16   Everlene Farrier, PA-C  enoxaparin (LOVENOX) 80 MG/0.8ML injection Inject 0.8 mLs (80 mg total) into the skin every 12 (twelve) hours for 7  doses. Patient not taking: Reported on 06/07/2018 05/24/18 06/07/18  Filbert Schilder, NP  fluticasone [facility]) 50 MCG/ACT nasal spray Place 1 spray into both nostrils daily. Patient not taking: Reported on 06/07/2018 11/20/17   Maxwell Caul, PA-C    Family History    Family History  Problem Relation Age of Onset  . Hypertension Mother   . Colon cancer Neg Hx   . Esophageal cancer Neg Hx   . Pancreatic cancer Neg Hx   . Stomach cancer Neg Hx   . Liver disease Neg Hx    Social History    Social History   Socioeconomic History  . Marital status: Single    Spouse name: Not on file  . Number of children: 2  . Years of education: 1  . Highest education level: Not on file  Occupational History  . Occupation: Distribution center    Employer: [employer]  Social Needs  . Financial resource strain: Not on file  . Food insecurity:    Worry: Not on file    Inability: Not on file  . Transportation needs:    Medical: Not on file    Non-medical: Not on file  Tobacco Use  . Smoking status: Never Smoker  . Smokeless tobacco: Never Used  Substance and Sexual Activity  . Alcohol use: No  . Drug use: No  . Sexual activity: Not on file  Lifestyle  . Physical activity:    Days per week: Not on file    Minutes per session: Not on file  . Stress: Not on file  Relationships  . Social connections:    Talks on  phone: Not on file    Gets together: Not on file    Attends religious service: Not on file    Active member of club or organization: Not on file    Attends meetings of clubs or organizations: Not on file    Relationship status: Not on file  . Intimate partner violence:    Fear of current or ex partner: Not on file    Emotionally abused: Not on file    Physically abused: Not on file    Forced sexual activity: Not on file  Other Topics Concern  . Not on file  Social History Narrative  . Not on file     Review of Systems   See HPI All other systems reviewed and are otherwise negative except as noted above.  Physical Exam    Blood pressure (!) 111/57, pulse 98, temperature 98.2 F (36.8 C), temperature source Oral, resp. rate (!) 29, SpO2 97 %.   General: Well developed, well nourished, NAD Skin: Warm, dry, intact  Head: Normocephalic, atraumatic, clear, moist mucus membranes. Neck: Negative for carotid bruits. No JVD Lungs:Clear to ausculation bilaterally. No wheezes, rales, or rhonchi. Breathing is unlabored. Cardiovascular: RRR with S1 S2. No murmurs, rubs, gallops, or LV heave appreciated. Abdomen: Soft, non-tender, non-distended with normoactive bowel sounds.  MSK: Strength and tone appear normal for age. Pulsatile mass in left groin  Extremities: No edema. No clubbing or cyanosis. DP/PT pulses 2+ bilaterally Neuro: Alert and oriented. No focal deficits. No facial asymmetry. MAE spontaneously. Psych: Responds to questions appropriately with normal affect.    Labs    Troponin (Point of Care Test) Recent Labs    06/07/18 1414  TROPIPOC 0.00   No results for input(s): CKTOTAL, CKMB, TROPONINI in the last 72 hours. Lab Results  Component Value Date   WBC 6.4 06/07/2018  HGB 9.8 (L) 06/07/2018   HCT 31.4 (L) 06/07/2018   MCV 83.5 06/07/2018   PLT 350 06/07/2018    Recent Labs  Lab 06/07/18 1406  NA 140  K 4.1  CL 107  CO2 25  BUN 15  CREATININE 1.14*    CALCIUM 9.0  PROT 6.4*  BILITOT 1.2  ALKPHOS 123  ALT 32  AST 34  GLUCOSE 107*   Lab Results  Component Value Date   CHOL 113 05/18/2018   HDL 70 05/18/2018   LDLCALC 37 05/18/2018   TRIG 29 05/18/2018   No results found for: [facility]   Radiology Studies    Dg Chest 2 View  Result Date: 06/07/2018 CLINICAL DATA:  Pt had a catheterization of her left groin x 2 weeks ago, and x 1 week has noted increasing swelling, pain and ocasional bleeding to the area of insertion EXAM: CHEST - 2 VIEW COMPARISON:  05/16/2018 FINDINGS: Stable changes from prior cardiac surgery. Cardiac silhouette is mildly enlarged. No mediastinal or hilar masses. No evidence of adenopathy. Diffuse aortic ectasia and atherosclerosis. Clear lungs.  No pleural effusion or pneumothorax. Skeletal structures are intact. IMPRESSION: No active cardiopulmonary disease. Electronically Signed   By: Amie Portland M.D.   On: 06/07/2018 16:27   Dg Chest 2 View  Result Date: 05/16/2018 CLINICAL DATA:  Chest pain. EXAM: CHEST - 2 VIEW COMPARISON:  Radiographs of November 19, 2017. FINDINGS: Stable cardiomediastinal silhouette. Status post coronary artery bypass graft. Atherosclerosis of thoracic aorta is noted. No pneumothorax or pleural effusion is noted. Both lungs are clear. The visualized skeletal structures are unremarkable. IMPRESSION: No active cardiopulmonary disease. Aortic Atherosclerosis (ICD10-I70.0). Electronically Signed   By: Lupita Raider, M.D.   On: 05/16/2018 09:31   ECG & Cardiac Imaging   EKG 06/07/18: The ekg ordered today demonstrates ST depression and TWI inferior leads and V4/5/6 that were not present on her post-heart cath EKG   Cardiac catheterization 05/20/2018:  Ost LM to LM lesion is 95% stenosed.  Ost LAD to Prox LAD lesion is 100% stenosed.  Ost 1st Mrg lesion is 90% stenosed.  Ost RCA to Prox RCA lesion is 100% stenosed. Not injected today.  SVG Y-graft to LAD and circumflex. Possible Origin  lesion is 90% stenosed of the prior SVG stent from 2017.  Normal right heart pressures.  Ao sat 98%, PA sat 76%, CO 6.4 L/min; CI 3.5; mean PA 13 mm Hg; mean PCWP 8 mm Hg  Thoracic aortic tortuosity made catheter manipulation more difficult.  Chest pain at the conclusion of the procedure was relieved with IV NTG. Significant ST depressions when she had CP and increased BP . Will need close monitoring of BP.  Possible severe in-stent restenosis of 4.0 mm Synergy stent from 2017.Severe native disease including left main. There is a long segment of stent hanging outside of the graft, which makes it difficult to selectively engage the vessel and know if the portion of the stent inside the vessel is patent.  Prox LAD is occluded. Fixing native left main would not help anterior wall.   If Graft stent has severe restenosis once, and restenting may not give a different longterm result.   Need to know LV function. Check echo. Would see if she is a redo candidate as LIMA was not used the last surgery.   Will check options for revascularization.   Higher contrast load due to difficulty finding grafts Recheck kidney function in AM.  Consider CT scan to  see if we can better define patency of the stent.   Given her chest pain and ECG changes, I suspect there is significant disease in the stent causing her ischemia.   Cardiac catheterization 05/21/2018:  Ost LM lesion is 95% stenosed.  A drug-eluting stent was successfully placed using a STENT RESOLUTE ONYX 5.0X12.  Post intervention, there is a 0% residual stenosis.  IABP placed preprocedure for left main intervention.  Prox LAD lesion is 10% stenosed.   Recommend to resume Warfarin, at currently prescribed dose and frequency, on 05/21/18. Recommend concurrent antiplatelet therapy of Aspirin 81mg  daily for 1 monthand Clopidogrel 75mg  daily for indefinitely, as long as there are no bleeding problems. .  Assessment &  Plan    1. Postoperative groin pseudoaneurysm (HCC) - Plan: VAS GROIN PSEUDOANEURYSM Confirmed with ultrasound in the office. Sent to the ER as below. Vascular surgery has evaluated the patient who recommends duplex directed manual occlusion of the pseudoaneurysm at this time with close follow-up. She will need to be off of her coumadin for the injection and most likely bridged with Lovenox or heparin given her mechanical AVR.  Could possibly plan for this on Monday or Tuesday pending INR given that she will be inpatient.   Coumadin will be held.  Will place on IV heparin once INR is less than 1.7.  INR today, 2.09.  2. Hematuria, unspecified type Pt states she has had mild hematuria since last week. She is on triple therapy in the setting of a mechanical aortic valve and recent stent placement: coumadin, ASA, and plavix. Plan was for triple therapy for 1 months, then D/C ASA.  Hemoglobin stable at 9.8 currently.  Will order repeat CBC for a.m. and monitor from there for need of PRBCs.  3. Abnormal EKG EKG changes noted today that were not present during her last hospitalization with stent placement to her left main.  ST depression and TWI inferior leads and V4/5/6.  No anginal symptoms we will continue to monitor rate i-STAT troponin on arrival 0.00.  4. Essential hypertension - No medication changes today.  Case discussed with Dr. Sunday, DOD. Given her left groin pseudoaneurysm, hematuria on triple therapy, and hematuria; hospital admission was recommended with STAT consult to vascular surgery. Dr. Friday is aware. There are no telemetry beds available, so she was sent straight to the [facility] by private vehicle. She agrees with this plan. I notified ER charge nurse of her impending arrival and ongoing medical problems.   5. AKI: Will hold ACE and follow BMET in AM   Severity of Illness: The appropriate patient status for this patient is INPATIENT. Inpatient status is judged to be  reasonable and necessary in order to provide the required intensity of service to ensure the patient's safety. The patient's presenting symptoms, physical exam findings, and initial radiographic and laboratory data in the context of their chronic comorbidities is felt to place them at high risk for further clinical deterioration. Furthermore, it is not anticipated that the patient will be medically stable for discharge from the hospital within 2 midnights of admission. The following factors support the patient status of inpatient.   " The patient's presenting symptoms include pseudoaneurysm . " The worrisome physical exam findings include hematuria. " The initial radiographic and laboratory data are worrisome because of . " The chronic co-morbidities include HTN.   * I certify that at the point of admission it is my clinical judgment that the patient will require inpatient hospital care spanning  beyond 2 midnights from the point of admission due to high intensity of service, high risk for further deterioration and high frequency of surveillance required.*    Signed, Georgie Chard NP-C [facility] Pager: [phone] 06/07/2018, @NOW

## 2018-06-07 NOTE — ED Triage Notes (Signed)
Pt presents from cardiologist office for "Plan: VAS [place] GROIN PSEUDOANEURYSM Confirmed with ultrasound in the office. Sent to the ER as below. Needs vascular surgery consult on arrival.".  Pt had a catheterization of her left groin x 2 weeks ago, and x 1 week has noted increasing swelling, pain and ocasional bleeding to the area of insertion (left groin).  Pt sent here due to pseudoaneurym diagnosis at cardiologist office today.  Patient c/o intermittent hematuria as well.

## 2018-06-07 NOTE — Patient Instructions (Signed)
Please go to [facility] ED.

## 2018-06-07 NOTE — ED Notes (Signed)
Meal arrived

## 2018-06-07 NOTE — Progress Notes (Signed)
ANTICOAGULATION CONSULT NOTE - Initial Consult  Pharmacy Consult for heparin Indication: Mechanical AVR  Allergies  Allergen Reactions  . Contrast Media [Iodinated Diagnostic Agents] Hives, Itching and Swelling    Patient Measurements:   Heparin Dosing Weight: 70 kg  Vital Signs: Temp: 98.2 F (36.8 C) (09/06 1216) Temp Source: Oral (09/06 1216) BP: 111/57 (09/06 1400) Pulse Rate: 98 (09/06 1400)  Labs: Recent Labs    06/07/18 1406  HGB 9.8*  HCT 31.4*  PLT 350  LABPROT 23.3*  INR 2.09  CREATININE 1.14*    Estimated Creatinine Clearance: 47.8 mL/min (A) (by C-G formula based on SCr of 1.14 mg/dL (H)).   Medical History: Past Medical History:  Diagnosis Date  . Ascending aortic aneurysm (HCC)    a.  redo replacement of fusiform aneurysm of ascending aorta (complicated by pericardial hematoma s/p evacuation) in 2014.  Marland Kitchen Contrast media allergy   . Coronary artery disease    a. s/p CABG x2 in 2014. b. s/p DES to SVG-LAD in 2017.  . H/O mechanical aortic valve replacement   . Hyperlipidemia   . Hypertension   . Ischemic cardiomyopathy    a. EF 40-45% in 2017 at time of NSTEMI, improved to normal in 2018.  . Non-STEMI (non-ST elevated myocardial infarction) (HCC)   . Premature atrial contractions   . PVC's (premature ventricular contractions)   . RA (rheumatoid arthritis) (HCC)   . S/P AVR (aortic valve replacement) 08/1999   St. Jude AVR for aortic insuff, Goal INR 2.5-3.5  . 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 Donata Clay) - complicated by pericardial hematoma --> subxiphoid pericardial exposure and evacuation b. Cath 06/2016: DES to SVG-LAD ostium  . Sarcoid   . Second degree AV block    a. ? possibly seen in 12/2016 clinic visit - EKG not truly suggestive  . Severe anemia   . 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 s/p left subclavian bypass.    Medications:   (Not  in a hospital admission)  Assessment: 59 YOF with a mechanical AVR on warfarin at home here with pseudoaneurysm near her PCI groin site. Planning duplex-directed mannual occlusion on Monday or Tuesday. Pharmacy consulted to start IV heparin when INR < 1.7. H/H low, Plt wnl.   Goal of Therapy:  Heparin level 0.3-0.5 units/ml Monitor platelets by anticoagulation protocol: Yes   Plan:  -F/u INR in AM -Will start IV heparin without bolus when INR < 1.7  Vinnie Level, PharmD., BCPS Clinical Pharmacist Clinical phone for 06/07/18 until 11pm: [phone]

## 2018-06-07 NOTE — Progress Notes (Signed)
Received pt from ED to 4E10. VSS. Tele applied, verified x2. CHG bath completed. L groin w/ small hematoma visualized. Pulses intact. Pt denies chest pain, does endorse L groin pain that worsens w/ walking. Meal tray provided. Pt oriented to room and call bell. Will continue to monitor.  Margarito Liner, RN

## 2018-06-07 NOTE — Progress Notes (Addendum)
Cardiology Office Note:    Date:  06/07/2018   ID:  [name], DOB 1951-09-03, MRN [id number]  PCP:  [name], MD  Cardiologist:  Chrystie Nose, MD   Referring MD: [name], MD   Chief Complaint  Patient presents with  . Follow-up    check groin site    History of Present Illness:    [name] is a 67 y.o. female with a hx of HTN, CAD, dilated ascending aorta at cath 11/03/12 s/p root and mechanical AVR 11/05/12 and CAD status post with SVG to LAD and SVG to circumflex,  complicated by pericardial effusion without tamponade requiring subxiphoid window, subclavian artery stenosis status post bypass in 2001, NSTEMI in 2017 with ischemic cardiomyopathy with an EF of 40 to 45%, heart cath resulting in PCI of the SVG to LAD, EF normalized in 2018.  She also has a history of sarcoidosis dyslipidemia, and contrast allergy.  Beta-blocker had been stopped on her previous visit due to what appears to be second-degree heart block.  She presented to the ER on 05/16/2018 with anginal chest pain and an elevated troponin of 0.11.  Admission was recommended but she declined.  She returns for admission on 05/17/2018.  Heart cath on 05/20/2018 with restenosis of the SVG ostium to the LAD.  Unable to engage the graft, staged PCI was planned.  On 05/21/2018, the patient returned to the Cath Lab for PCI of her left main.  She was discharged with plans for Coumadin aspirin and Plavix for 1 month and then stopping aspirin.  She returns today for a work-in appt stating she is having problems with her groin site. Left groin has a hard lump that is pulsatile. She also reports mild hematuria since last week. She has no anginal complaints and has been compliant on all medications.   Ultrasound in the office confirmed presence of pseudoaneurysm.   Past Medical History:  Diagnosis Date  . Ascending aortic aneurysm (HCC)    a.  redo replacement of fusiform aneurysm of ascending aorta (complicated by  pericardial hematoma s/p evacuation) in 2014.  Marland Kitchen Contrast media allergy   . Coronary artery disease    a. s/p CABG x2 in 2014. b. s/p DES to SVG-LAD in 2017.  . H/O mechanical aortic valve replacement   . Hyperlipidemia   . Hypertension   . Ischemic cardiomyopathy    a. EF 40-45% in 2017 at time of NSTEMI, improved to normal in 2018.  . Non-STEMI (non-ST elevated myocardial infarction) (HCC)   . Premature atrial contractions   . PVC's (premature ventricular contractions)   . RA (rheumatoid arthritis) (HCC)   . S/P AVR (aortic valve replacement) 08/1999   St. Jude AVR for aortic insuff, Goal INR 2.5-3.5  . 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 Donata Clay) - complicated by pericardial hematoma --> subxiphoid pericardial exposure and evacuation b. Cath 06/2016: DES to SVG-LAD ostium  . Sarcoid   . Second degree AV block    a. ? possibly seen in 12/2016 clinic visit - EKG not truly suggestive  . Severe anemia   . 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 s/p left subclavian bypass.    Past Surgical History:  Procedure Laterality Date  . AORTIC VALVE REPLACEMENT  08/23/1999   23mm St. Jude, Bentall procedure, root replacemtn   . BYPASS GRAFT ANGIOGRAPHY  05/20/2018   Procedure:  BYPASS GRAFT ANGIOGRAPHY;  Surgeon: Corky Crafts, MD;  Location: [facility] INVASIVE CV LAB;  Service: Cardiovascular;;  . CARDIAC CATHETERIZATION  08/1999   normal L main, LAD & first diagonal are normal, normal Cfx, RCA with mild ostial narrowing, normal LV systolic function, severe AI, mild MR, normal PAP (Dr. Mervyn Skeeters. Little)   . CARDIAC CATHETERIZATION N/A 06/22/2016   Procedure: Left Heart Cath and Cors/Grafts Angiography;  Surgeon: Marykay Lex, MD;  Location: [facility] INVASIVE CV LAB;  Service: Cardiovascular;  Laterality: N/A;  . CARDIAC CATHETERIZATION N/A 06/23/2016   Procedure: Left Heart Cath and Coronary Angiography;   Surgeon: Tonny Bollman, MD;  Location: [facility] INVASIVE CV LAB;  Service: Cardiovascular;  Laterality: N/A;  . CARDIAC CATHETERIZATION N/A 06/23/2016   Procedure: Coronary Stent Intervention;  Surgeon: Tonny Bollman, MD;  Location: [facility] INVASIVE CV LAB;  Service: Cardiovascular;  Laterality: N/A;  . CORONARY ARTERY BYPASS GRAFT  11/05/2012   Procedure: CORONARY ARTERY BYPASS GRAFTING (CABG);  Surgeon: Kerin Perna, MD;  Location: [facility] OR;  Service: Open Heart Surgery;  Laterality: N/A;  cannulate right subclavian  . CORONARY STENT INTERVENTION N/A 05/21/2018   Procedure: CORONARY STENT INTERVENTION;  Surgeon: Corky Crafts, MD;  Location: [facility] INVASIVE CV LAB;  Service: Cardiovascular;  Laterality: N/A;  . ENDOVEIN HARVEST OF GREATER SAPHENOUS VEIN  11/05/2012   Procedure: ENDOVEIN HARVEST OF GREATER SAPHENOUS VEIN;  Surgeon: Kerin Perna, MD;  Location: [facility] OR;  Service: Open Heart Surgery;  Laterality: Right;  . IABP INSERTION N/A 05/21/2018   Procedure: IABP INSERTION;  Surgeon: Corky Crafts, MD;  Location: [facility] INVASIVE CV LAB;  Service: Cardiovascular;  Laterality: N/A;  . INTRAOPERATIVE TRANSESOPHAGEAL ECHOCARDIOGRAM  11/05/2012   Procedure: INTRAOPERATIVE TRANSESOPHAGEAL ECHOCARDIOGRAM;  Surgeon: Kerin Perna, MD;  Location: [facility] OR;  Service: Open Heart Surgery;  Laterality: N/A;  . INTRAOPERATIVE TRANSESOPHAGEAL ECHOCARDIOGRAM N/A 11/21/2012   Procedure: INTRAOPERATIVE TRANSESOPHAGEAL ECHOCARDIOGRAM;  Surgeon: Kerin Perna, MD;  Location: [facility] OR;  Service: Open Heart Surgery;  Laterality: N/A;  . LEFT HEART CATHETERIZATION WITH CORONARY ANGIOGRAM N/A 11/04/2012   Procedure: LEFT HEART CATHETERIZATION WITH CORONARY ANGIOGRAM;  Surgeon: Chrystie Nose, MD;  Location: [facility] CATH LAB;  Service: Cardiovascular;  Laterality: N/A;  . MEDIASTINOSCOPY  08/2005   bronchoscopy & mediastinoscopy for mediastinal adenopathy (Dr. Donata Clay)   . NM MYOCAR PERF WALL MOTION  01/2012   lexiscan; small, fixed  apical septal breast attenuation artifact & inferolateral bowel attenuation, no reversible ischemia, post-stress EF 64%, low risk scan   . PERICARDIAL WINDOW N/A 11/21/2012   Procedure: PERICARDIAL WINDOW;  Surgeon: Kerin Perna, MD;  Location: [facility] OR;  Service: Thoracic;  Laterality: N/A;  . REPLACEMENT ASCENDING AORTA  11/05/2012   Procedure: REPLACEMENT ASCENDING AORTA;  Surgeon: Kerin Perna, MD;  Location: [facility] OR;  Service: Open Heart Surgery;  Laterality: N/A;  . SUBCLAVIAN ANGIOGRAM  05/10/2000   left subclavian arteriogram; patent graft, mild stenosis (Dr. Amada Kingfisher)   . SUBCLAVIAN BYPASS GRAFT  05/16/1999   left subclavian bypass with 70mm Darcon graft (Dr. Amada Kingfisher)  . TRANSTHORACIC ECHOCARDIOGRAM  11/20/2012   EF 65-70%, mild conc LVH, grade 2 diastolic dysfunction, mech AV, mild MR    Current Medications: Current Meds  Medication Sig  . aspirin EC 81 MG EC tablet Take 1 tablet (81 mg total) by mouth daily.  Marland Kitchen atorvastatin (LIPITOR) 80 MG tablet TAKE 1 TABLET (80 MG TOTAL) BY MOUTH DAILY AT 6 PM.  .  Calcium Carb-Cholecalciferol (CALCIUM 600+D3 PO) Take 2,000 Units by mouth.  . clopidogrel (PLAVIX) 75 MG tablet TAKE 1 TABLET BY MOUTH DAILY WITH BREAKFAST (Patient taking differently: Take 75 mg by mouth daily. )  . diazepam (VALIUM) 2 MG tablet Take 1 tablet (2 mg total) by mouth every 8 (eight) hours as needed for muscle spasms.  . Diclofenac Sodium 3 % GEL Place 1 application onto the skin 2 (two) times daily. To affected area.  . enoxaparin (LOVENOX) 80 MG/0.8ML injection Inject 0.8 mLs (80 mg total) into the skin every 12 (twelve) hours for 7 doses.  . fluticasone (FLONASE) 50 MCG/ACT nasal spray Place 1 spray into both nostrils daily.  . isosorbide mononitrate (IMDUR) 30 MG 24 hr tablet TAKE 1 TABLET BY MOUTH EVERY DAY  . lisinopril (PRINIVIL,ZESTRIL) 2.5 MG tablet Take 1 tablet (2.5 mg total) by mouth daily.  . Multiple Vitamin (MULTIVITAMIN WITH MINERALS) TABS tablet Take 1  tablet by mouth daily.  . nitroGLYCERIN (NITROSTAT) 0.4 MG SL tablet PLACE 1 TABLET (0.4 MG TOTAL) UNDER THE TONGUE EVERY 5 (FIVE) MINUTES AS NEEDED FOR CHEST PAIN.  Marland Kitchen ondansetron (ZOFRAN) 4 MG tablet Take 1 tablet (4 mg total) by mouth every 6 (six) hours.  . pantoprazole (PROTONIX) 40 MG tablet TAKE 1 TABLET BY MOUTH EVERY DAY (Patient taking differently: Take 40 mg by mouth daily. )  . polyvinyl alcohol (ARTIFICIAL TEARS) 1.4 % ophthalmic solution Place 1-2 drops into both eyes daily as needed for dry eyes.  Marland Kitchen warfarin (COUMADIN) 5 MG tablet TAKE 1/2 TO 1 TABLET BY MOUTH DAILY OR AS DIRECTED BY COUMADIN CLINIC (Patient taking differently: TAKE 1/2 TO 1 TABLET BY MOUTH DAILY OR AS DIRECTED BY COUMADIN CLINIC)     Allergies:   Contrast media [iodinated diagnostic agents]   Social History   Socioeconomic History  . Marital status: Single    Spouse name: Not on file  . Number of children: 2  . Years of education: 17  . Highest education level: Not on file  Occupational History  . Occupation: Distribution center    Employer: [employer]  Social Needs  . Financial resource strain: Not on file  . Food insecurity:    Worry: Not on file    Inability: Not on file  . Transportation needs:    Medical: Not on file    Non-medical: Not on file  Tobacco Use  . Smoking status: Never Smoker  . Smokeless tobacco: Never Used  Substance and Sexual Activity  . Alcohol use: No  . Drug use: No  . Sexual activity: Not on file  Lifestyle  . Physical activity:    Days per week: Not on file    Minutes per session: Not on file  . Stress: Not on file  Relationships  . Social connections:    Talks on phone: Not on file    Gets together: Not on file    Attends religious service: Not on file    Active member of club or organization: Not on file    Attends meetings of clubs or organizations: Not on file    Relationship status: Not on file  Other Topics Concern  . Not on file  Social History  Narrative  . Not on file     Family History: The patient's family history includes Hypertension in her mother. There is no history of Colon cancer, Esophageal cancer, Pancreatic cancer, Stomach cancer, or Liver disease.  ROS:   Please see the history of present  illness.    All other systems reviewed and are negative.  EKGs/Labs/Other Studies Reviewed:    The following studies were reviewed today:  Cardiac catheterization 05/20/2018:  Ost LM to LM lesion is 95% stenosed.  Ost LAD to Prox LAD lesion is 100% stenosed.  Ost 1st Mrg lesion is 90% stenosed.  Ost RCA to Prox RCA lesion is 100% stenosed. Not injected today.  SVG Y-graft to LAD and circumflex. Possible Origin lesion is 90% stenosed of the prior SVG stent from 2017.  Normal right heart pressures.  Ao sat 98%, PA sat 76%, CO 6.4 L/min; CI 3.5; mean PA 13 mm Hg; mean PCWP 8 mm Hg  Thoracic aortic tortuosity made catheter manipulation more difficult.  Chest pain at the conclusion of the procedure was relieved with IV NTG. Significant ST depressions when she had CP and increased BP . Will need close monitoring of BP.  Possible severe in-stent restenosis of 4.0 mm Synergy stent from 2017.Severe native disease including left main. There is a long segment of stent hanging outside of the graft, which makes it difficult to selectively engage the vessel and know if the portion of the stent inside the vessel is patent.  Prox LAD is occluded. Fixing native left main would not help anterior wall.   If Graft stent has severe restenosis once, and restenting may not give a different longterm result.   Need to know LV function. Check echo. Would see if she is a redo candidate as LIMA was not used the last surgery.   Will check options for revascularization.   Higher contrast load due to difficulty finding grafts Recheck kidney function in AM.  Consider CT scan to see if we can better define patency of the stent.     Given her chest pain and ECG changes, I suspect there is significant disease in the stent causing her ischemia.   Cardiac catheterization 05/21/2018:  Ost LM lesion is 95% stenosed.  A drug-eluting stent was successfully placed using a STENT RESOLUTE ONYX 5.0X12.  Post intervention, there is a 0% residual stenosis.  IABP placed preprocedure for left main intervention.  Prox LAD lesion is 10% stenosed.   Recommend to resume Warfarin, at currently prescribed dose and frequency, on 05/21/18. Recommend concurrent antiplatelet therapy of Aspirin 81mg  daily for 1 monthand Clopidogrel 75mg  daily for indefinitely, as long as there are no bleeding problems. . _____________   EKG:  EKG is ordered today.  The ekg ordered today demonstrates ST depression and TWI inferior leads and V4/5/6 that were not present on her post-heart cath EKG  Recent Labs: 05/17/2018: ALT 31; TSH 1.910 05/24/2018: BUN 13; Creatinine, Ser 0.88; Hemoglobin 10.7; Platelets 147; Potassium 3.6; Sodium 142  Recent Lipid Panel    Component Value Date/Time   CHOL 113 05/18/2018 0332   CHOL 134 02/22/2018 0843   CHOL 173 10/14/2013 0943   TRIG 29 05/18/2018 0332   TRIG 75 10/14/2013 0943   HDL 70 05/18/2018 0332   HDL 84 02/22/2018 0843   HDL 79 10/14/2013 0943   CHOLHDL 1.6 05/18/2018 0332   VLDL 6 05/18/2018 0332   LDLCALC 37 05/18/2018 0332   LDLCALC 42 02/22/2018 0843   LDLCALC 79 10/14/2013 0943    Physical Exam:    VS:  BP (!) 100/52   Pulse 77   Ht 5\' 7"  (1.702 m)   Wt 156 lb 6.4 oz (70.9 kg)   BMI 24.50 kg/m     Wt Readings from Last 3  Encounters:  06/07/18 156 lb 6.4 oz (70.9 kg)  05/24/18 170 lb 10.2 oz (77.4 kg)  02/22/18 158 lb 12.8 oz (72 kg)     GEN: Well nourished, well developed in no acute distress HEENT: Normal NECK: No JVD; No carotid bruits LYMPHATICS: No lymphadenopathy CARDIAC: RRR, no murmurs, rubs, gallops RESPIRATORY:  Clear to auscultation without rales, wheezing  or rhonchi  ABDOMEN: Soft, non-tender, non-distended MUSCULOSKELETAL:  No edema; left groin with pulsatile mass, pseudoaneurysm SKIN: Warm and dry NEUROLOGIC:  Alert and oriented x 3 PSYCHIATRIC:  Normal affect   ASSESSMENT:    1. Postoperative groin pseudoaneurysm (HCC)   2. Essential hypertension   3. Hematuria, unspecified type   4. Abnormal EKG    PLAN:    In order of problems listed above:  Postoperative groin pseudoaneurysm (HCC) - Plan: VAS [place] GROIN PSEUDOANEURYSM Confirmed with ultrasound in the office. Sent to the ER as below. Needs vascular surgery consult on arrival.   Hematuria, unspecified type Pt states she has had mild hematuria since last week. She is on triple therapy in the setting of a mechanical aortic valve and recent stent placement: coumadin, ASA, and plavix. Plan was for triple therapy for 1 months, then D/C ASA. Needs CBC.  Abnormal EKG EKG changes noted today that were not present during her last hospitalization with stent placement to her left main.  ST depression and TWI inferior leads and V4/5/6.  Essential hypertension - Plan: EKG 12-Lead No medication changes today.  Case discussed with Dr. Royann Shivers, DOD. Given her left groin pseudoaneurysm, hematuria on triple therapy, and hematuria; hospital admission was recommended with STAT consult to vascular surgery. Dr. Allyson Sabal is aware. There are no telemetry beds available, so she was sent straight to the [facility] by private vehicle. She agrees with this plan. I notified ER charge nurse of her impending arrival and ongoing medical problems.    Medication Adjustments/Labs and Tests Ordered: Current medicines are reviewed at length with the patient today.  Concerns regarding medicines are outlined above.  Orders Placed This Encounter  Procedures  . EKG 12-Lead   No orders of the defined types were placed in this encounter.   Signed, Marcelino Duster, PA  06/07/2018 11:47 AM    [facility]

## 2018-06-07 NOTE — ED Notes (Signed)
Ordered heart healthy tray  

## 2018-06-08 DIAGNOSIS — I729 Aneurysm of unspecified site: Secondary | ICD-10-CM

## 2018-06-08 DIAGNOSIS — I5042 Chronic combined systolic (congestive) and diastolic (congestive) heart failure: Secondary | ICD-10-CM | POA: Diagnosis not present

## 2018-06-08 DIAGNOSIS — I724 Aneurysm of artery of lower extremity: Secondary | ICD-10-CM | POA: Diagnosis not present

## 2018-06-08 DIAGNOSIS — Z952 Presence of prosthetic heart valve: Secondary | ICD-10-CM | POA: Diagnosis not present

## 2018-06-08 LAB — LIPID PANEL
CHOLESTEROL: 107 mg/dL (ref 0–200)
HDL: 51 mg/dL (ref >40)
LDL CALC: 40 mg/dL (ref 0–99)
TRIGLYCERIDES: 82 mg/dL (ref <150)
Total CHOL/HDL Ratio: 2.1 RATIO
VLDL: 16 mg/dL (ref 0–40)

## 2018-06-08 LAB — CBC
HCT: 30.2 % — ABNORMAL LOW (ref 36.0–46.0)
Hemoglobin: 9.6 g/dL — ABNORMAL LOW (ref 12.0–15.0)
MCH: 26.4 pg (ref 26.0–34.0)
MCHC: 31.8 g/dL (ref 30.0–36.0)
MCV: 83 fL (ref 78.0–100.0)
Platelets: 328 10*3/uL (ref 150–400)
RBC: 3.64 MIL/uL — ABNORMAL LOW (ref 3.87–5.11)
RDW: 17.6 % — AB (ref 11.5–15.5)
WBC: 7.9 10*3/uL (ref 4.0–10.5)

## 2018-06-08 LAB — PROTIME-INR
INR: 2.12
INR: 2.09
INR: 2.08
PROTHROMBIN TIME: 23.3 s — AB (ref 11.4–15.2)
Prothrombin Time: 23.6 seconds — ABNORMAL HIGH (ref 11.4–15.2)
Prothrombin Time: 23.2 seconds — ABNORMAL HIGH (ref 11.4–15.2)

## 2018-06-08 LAB — BASIC METABOLIC PANEL
ANION GAP: 9 (ref 5–15)
BUN: 11 mg/dL (ref 8–23)
CHLORIDE: 105 mmol/L (ref 98–111)
CO2: 24 mmol/L (ref 22–32)
Calcium: 8.8 mg/dL — ABNORMAL LOW (ref 8.9–10.3)
Creatinine, Ser: 1 mg/dL (ref 0.44–1.00)
GFR calc Af Amer: >60 mL/min (ref >60)
GFR calc non Af Amer: 58 mL/min — ABNORMAL LOW (ref >60)
Glucose, Bld: 131 mg/dL — ABNORMAL HIGH (ref 70–99)
POTASSIUM: 4 mmol/L (ref 3.5–5.1)
SODIUM: 138 mmol/L (ref 135–145)

## 2018-06-08 LAB — TROPONIN I
Troponin I: <0.03 ng/mL (ref <0.03)
Troponin I: <0.03 ng/mL (ref <0.03)

## 2018-06-08 MED ORDER — PHYTONADIONE 5 MG PO TABS
2.5000 mg | ORAL_TABLET | Freq: Once | ORAL | Status: DC
  Filled 2018-06-08: qty 1

## 2018-06-08 MED ORDER — CEFAZOLIN SODIUM-DEXTROSE 1-4 GM/50ML-% IV SOLN
1.0000 g | INTRAVENOUS | Status: AC
  Filled 2018-06-08: qty 50

## 2018-06-08 MED ORDER — VITAMIN K1 10 MG/ML IJ SOLN
1.0000 mg | Freq: Once | INTRAVENOUS | Status: AC
  Administered 2018-06-08: 1 mg via INTRAVENOUS
  Filled 2018-06-08: qty 0.1

## 2018-06-08 NOTE — H&P (View-Only) (Signed)
   VASCULAR SURGERY ASSESSMENT & PLAN:   LEFT GROIN PSEUDOANEURYSM: This patient has a fairly large left groin pseudoaneurysm which originates from the distal left external iliac artery and proximal left common femoral artery.  This study was done at [facility] and I was able to review the images.  I am concerned that the neck of the pseudoaneurysm is very short (less than 1 cm) and therefore I am not sure that this patient is a good candidate for thrombin injection or compression.  For this reason I think it would probably be best to repair this surgically.  Her INR this morning is 2.12.  She is on heparin as a bridge.  Monday's schedule is full and therefore I would like to proceed with repairing this tomorrow if we can get her INR in a safer range.  If it is okay with cardiology I would like to give her a small dose of vitamin K so that hopefully we could proceed with surgery tomorrow.  I have discussed with her the indications for surgery and the potential risks including but not limited to bleeding, wound healing problems, and infection.  She is agreeable to proceed.  SUBJECTIVE:   Still with some tenderness in the left groin from the pseudoaneurysm.  PHYSICAL EXAM:   Vitals:   06/07/18 1932 06/07/18 1952 06/07/18 2335 06/08/18 0456  BP: (!) 97/59 102/66 (!) 101/51 102/69  Pulse:  97 78 80  Resp:  14 20 20   Temp:  99 F (37.2 C) 98.1 F (36.7 C) 98.1 F (36.7 C)  TempSrc:  Oral Oral Oral  SpO2:  99% 98% 97%  Weight:  70.7 kg  70.6 kg  Height:  5\' 7"  (1.702 m)     Tender mass left groin Palpable left dorsalis pedis pulse  LABS:   Lab Results  Component Value Date   WBC 5.4 06/07/2018   HGB 9.1 (L) 06/07/2018   HCT 28.6 (L) 06/07/2018   MCV 81.7 06/07/2018   PLT 314 06/07/2018   Lab Results  Component Value Date   CREATININE 1.14 (H) 06/07/2018   Lab Results  Component Value Date   INR 2.12 06/08/2018    PROBLEM LIST:    Active Problems:   Pseudoaneurysm  (HCC)   CURRENT MEDS:   . aspirin EC  81 mg Oral Daily  . atorvastatin  80 mg Oral q1800  . clopidogrel  75 mg Oral Q breakfast  . isosorbide mononitrate  30 mg Oral Daily  . pantoprazole  40 mg Oral Daily    08/07/2018 Beeper: 08/08/2018 Office: [phone] 06/08/2018

## 2018-06-08 NOTE — Progress Notes (Signed)
Progress Note  Patient Name: [name] Date of Encounter: 06/08/2018  Primary Cardiologist: Chrystie Nose, MD   Subjective   She did not sleep well last night because of abdominal pain.  She has no chest pain and her breathing is stable.  Her groin has been stable.  Inpatient Medications    Scheduled Meds: . aspirin EC  81 mg Oral Daily  . atorvastatin  80 mg Oral q1800  . clopidogrel  75 mg Oral Q breakfast  . isosorbide mononitrate  30 mg Oral Daily  . pantoprazole  40 mg Oral Daily   Continuous Infusions: . [START ON 06/09/2018]  ceFAZolin (ANCEF) IV    . phytonadione (VITAMIN K) IV     PRN Meds: acetaminophen, magnesium hydroxide, nitroGLYCERIN, ondansetron (ZOFRAN) IV   Vital Signs    Vitals:   06/07/18 1932 06/07/18 1952 06/07/18 2335 06/08/18 0456  BP: (!) 97/59 102/66 (!) 101/51 102/69  Pulse:  97 78 80  Resp:  14 20 20   Temp:  99 F (37.2 C) 98.1 F (36.7 C) 98.1 F (36.7 C)  TempSrc:  Oral Oral Oral  SpO2:  99% 98% 97%  Weight:  70.7 kg  70.6 kg  Height:  5\' 7"  (1.702 m)      Intake/Output Summary (Last 24 hours) at 06/08/2018 1235 Last data filed at 06/08/2018 0503 Gross per 24 hour  Intake -  Output 4 ml  Net -4 ml   Filed Weights   06/07/18 1952 06/08/18 0456  Weight: 70.7 kg 70.6 kg    Telemetry    Sinus rhythm.  Occasional PVCs.- Personally Reviewed  ECG    Sinus rhythm.  Rate 82 bpm.  - Personally Reviewed  Physical Exam   VS:  BP 102/69 (BP Location: Right Arm)   Pulse 80   Temp 98.1 F (36.7 C) (Oral)   Resp 20   Ht 5\' 7"  (1.702 m)   Wt 70.6 kg   SpO2 97%   BMI 24.39 kg/m  , BMI Body mass index is 24.39 kg/m. GENERAL:  Well appearing HEENT: Pupils equal round and reactive, fundi not visualized, oral mucosa unremarkable NECK:  No jugular venous distention, waveform within normal limits, carotid upstroke brisk and symmetric, no bruits LUNGS:  Clear to auscultation bilaterally HEART:  RRR.  PMI not displaced or  sustained,S1 within normal limits.  Mechanical S2. No S3, no S4, no clicks, no rubs, II/VI systolic murmur ABD:  Flat, positive bowel sounds normal in frequency in pitch, no bruits, no rebound, no guarding, no midline pulsatile mass, no hepatomegaly, no splenomegaly EXT:  2 plus pulses throughout, no edema, no cyanosis no clubbing.  L groin hematoma and systolic bruit SKIN:  No rashes no nodules NEURO:  Cranial nerves II through XII grossly intact, motor grossly intact throughout [facility]:  Cognitively intact, oriented to person place and time   Labs    Chemistry Recent Labs  Lab 06/07/18 1406 06/08/18 0834  NA 140 138  K 4.1 4.0  CL 107 105  CO2 25 24  GLUCOSE 107* 131*  BUN 15 11  CREATININE 1.14* 1.00  CALCIUM 9.0 8.8*  PROT 6.4*  --   ALBUMIN 3.2*  --   AST 34  --   ALT 32  --   ALKPHOS 123  --   BILITOT 1.2  --   GFRNONAA 49* 58*  GFRAA 57* >60  ANIONGAP 8 9     Hematology Recent Labs  Lab 06/07/18 1406 06/07/18 2041  06/08/18 0834  WBC 6.4 5.4 7.9  RBC 3.76* 3.50* 3.64*  HGB 9.8* 9.1* 9.6*  HCT 31.4* 28.6* 30.2*  MCV 83.5 81.7 83.0  MCH 26.1 26.0 26.4  MCHC 31.2 31.8 31.8  RDW 17.6* 17.5* 17.6*  PLT 350 314 328    Cardiac Enzymes Recent Labs  Lab 06/07/18 2041 06/08/18 0235 06/08/18 0834  TROPONINI <0.03 <0.03 <0.03    Recent Labs  Lab 06/07/18 1414  TROPIPOC 0.00     BNPNo results for input(s): BNP, PROBNP in the last 168 hours.   DDimer No results for input(s): DDIMER in the last 168 hours.   Radiology    Dg Chest 2 View  Result Date: 06/07/2018 CLINICAL DATA:  Pt had a catheterization of her left groin x 2 weeks ago, and x 1 week has noted increasing swelling, pain and ocasional bleeding to the area of insertion EXAM: CHEST - 2 VIEW COMPARISON:  05/16/2018 FINDINGS: Stable changes from prior cardiac surgery. Cardiac silhouette is mildly enlarged. No mediastinal or hilar masses. No evidence of adenopathy. Diffuse aortic ectasia and  atherosclerosis. Clear lungs.  No pleural effusion or pneumothorax. Skeletal structures are intact. IMPRESSION: No active cardiopulmonary disease. Electronically Signed   By: Amie Portland M.D.   On: 06/07/2018 16:27    Cardiac Studies   L groin u/s 06/07/18:  An area with well defined borders measuring 2.6 cm x 2.9 cm was visualized arising off of the distal external/proximal common femoral artery with ultrasound characteristics of a pseudoaneurysm. The neck measures approximately 0.3 cm wide and 0.7 cm long.   Cardiac catheterization 05/20/2018:  Ost LM to LM lesion is 95% stenosed.  Ost LAD to Prox LAD lesion is 100% stenosed.  Ost 1st Mrg lesion is 90% stenosed.  Ost RCA to Prox RCA lesion is 100% stenosed. Not injected today.  SVG Y-graft to LAD and circumflex. Possible Origin lesion is 90% stenosed of the prior SVG stent from 2017.  Normal right heart pressures.  Ao sat 98%, PA sat 76%, CO 6.4 L/min; CI 3.5; mean PA 13 mm Hg; mean PCWP 8 mm Hg  Thoracic aortic tortuosity made catheter manipulation more difficult.  Chest pain at the conclusion of the procedure was relieved with IV NTG. Significant ST depressions when she had CP and increased BP . Will need close monitoring of BP.  Possible severe in-stent restenosis of 4.0 mm Synergy stent from 2017.Severe native disease including left main. There is a long segment of stent hanging outside of the graft, which makes it difficult to selectively engage the vessel and know if the portion of the stent inside the vessel is patent.  Prox LAD is occluded. Fixing native left main would not help anterior wall.   If Graft stent has severe restenosis once, and restenting may not give a different longterm result.   Need to know LV function. Check echo. Would see if she is a redo candidate as LIMA was not used the last surgery.   Will check options for revascularization.   Higher contrast load due to difficulty finding  grafts Recheck kidney function in AM.  Consider CT scan to see if we can better define patency of the stent.   Given her chest pain and ECG changes, I suspect there is significant disease in the stent causing her ischemia.   Cardiac catheterization 05/21/2018:  Ost LM lesion is 95% stenosed.  A drug-eluting stent was successfully placed using a STENT RESOLUTE ONYX 5.0X12.  Post intervention, there is  a 0% residual stenosis.  IABP placed preprocedure for left main intervention.  Prox LAD lesion is 10% stenosed.   Recommend to resume Warfarin, at currently prescribed dose and frequency, on 05/21/18. Recommend concurrent antiplatelet therapy of Aspirin 81mg  daily for 1 monthand Clopidogrel 75mg  daily for indefinitely, as long as there are no bleeding problems.   Patient Profile     67 y.o. female with CAD status post CABG and recent left main PCI, hypertension, ascending aortic aneurysm, status post aortic root and mechanical AVR 12/2012, subclavian artery stenosis status post bypass 2011, chronic systolic and diastolic heart failure LVEF 4045%, ischemic cardia myopathy, sarcoidosis, hyperlipidemia, and contrast allergy here with pseudoaneurysm after her left main PCI.  Assessment & Plan    # L groin pseudoaneurysm:  Stable.  Appreciate Vascular Surgery team.  She will go for repair with Dr. 01/2013 tomorrow.  She isn't a good candidate for injection or compression given that the neck of the pseudoaneurysm is short.  INR was 2 today.  We will give 2.5 mg of vitamin K and repeat an INR level this afternoon.  If her INR is less than 1.7 we will start heparin.  Continue aspirin.  # CAD s/p CABG and recent LM PCI: # Hyperlipidemia: Stable.  Continue aspirin, atorvastatin, clopidogrel, and isosorbide mononitrate.  # Chronic systolic and diastolic heart failure: She is euvolemic on exam.  Blood pressure is too low for antihypertensives.  # S/p AVR: # S/p Aortic root  repair: Holding warfarin for surgery.  Goal INR 2-3.  Will give vitamin K and start heparin when INR <1.7.  For questions or updates, please contact [facility] Please consult [email or web address] for contact info under        Signed, 2012, MD  06/08/2018, 12:35 PM

## 2018-06-08 NOTE — Plan of Care (Signed)
  Problem: Health Behavior/Discharge Planning: Goal: Ability to manage health-related needs will improve Outcome: Progressing   Problem: Clinical Measurements: Goal: Diagnostic test results will improve Outcome: Progressing   Problem: Coping: Goal: Level of anxiety will decrease Outcome: Progressing   

## 2018-06-08 NOTE — Progress Notes (Signed)
   VASCULAR SURGERY ASSESSMENT & PLAN:   LEFT GROIN PSEUDOANEURYSM: This patient has a fairly large left groin pseudoaneurysm which originates from the distal left external iliac artery and proximal left common femoral artery.  This study was done at [facility] and I was able to review the images.  I am concerned that the neck of the pseudoaneurysm is very short (less than 1 cm) and therefore I am not sure that this patient is a good candidate for thrombin injection or compression.  For this reason I think it would probably be best to repair this surgically.  Her INR this morning is 2.12.  She is on heparin as a bridge.  Monday's schedule is full and therefore I would like to proceed with repairing this tomorrow if we can get her INR in a safer range.  If it is okay with cardiology I would like to give her a small dose of vitamin K so that hopefully we could proceed with surgery tomorrow.  I have discussed with her the indications for surgery and the potential risks including but not limited to bleeding, wound healing problems, and infection.  She is agreeable to proceed.  SUBJECTIVE:   Still with some tenderness in the left groin from the pseudoaneurysm.  PHYSICAL EXAM:   Vitals:   06/07/18 1932 06/07/18 1952 06/07/18 2335 06/08/18 0456  BP: (!) 97/59 102/66 (!) 101/51 102/69  Pulse:  97 78 80  Resp:  14 20 20  Temp:  99 F (37.2 C) 98.1 F (36.7 C) 98.1 F (36.7 C)  TempSrc:  Oral Oral Oral  SpO2:  99% 98% 97%  Weight:  70.7 kg  70.6 kg  Height:  5' 7" (1.702 m)     Tender mass left groin Palpable left dorsalis pedis pulse  LABS:   Lab Results  Component Value Date   WBC 5.4 06/07/2018   HGB 9.1 (L) 06/07/2018   HCT 28.6 (L) 06/07/2018   MCV 81.7 06/07/2018   PLT 314 06/07/2018   Lab Results  Component Value Date   CREATININE 1.14 (H) 06/07/2018   Lab Results  Component Value Date   INR 2.12 06/08/2018    PROBLEM LIST:    Active Problems:   Pseudoaneurysm  (HCC)   CURRENT MEDS:   . aspirin EC  81 mg Oral Daily  . atorvastatin  80 mg Oral q1800  . clopidogrel  75 mg Oral Q breakfast  . isosorbide mononitrate  30 mg Oral Daily  . pantoprazole  40 mg Oral Daily    [name] Beeper: [phone] Office: [phone] 06/08/2018  

## 2018-06-08 NOTE — Progress Notes (Signed)
ANTICOAGULATION CONSULT NOTE   Pharmacy Consult for heparin Indication: Mechanical AVR  Allergies  Allergen Reactions  . Contrast Media [Iodinated Diagnostic Agents] Hives, Itching and Swelling    Patient Measurements: Height: 5\' 7"  (170.2 cm) Weight: 155 lb 11.2 oz (70.6 kg) IBW/kg (Calculated) : 61.6 Heparin Dosing Weight: 70 kg  Vital Signs: Temp: 98.1 F (36.7 C) (09/07 0456) Temp Source: Oral (09/07 0456) BP: 102/69 (09/07 0456) Pulse Rate: 80 (09/07 0456)  Labs: Recent Labs    06/07/18 1406 06/07/18 2041 06/08/18 0235 06/08/18 0834  HGB 9.8* 9.1*  --  9.6*  HCT 31.4* 28.6*  --  30.2*  PLT 350 314  --  328  LABPROT 23.3*  --  23.6* 23.3*  INR 2.09  --  2.12 2.09  CREATININE 1.14*  --   --  1.00  TROPONINI  --  <0.03 <0.03 <0.03    Estimated Creatinine Clearance: 54.5 mL/min (by C-G formula based on SCr of 1 mg/dL).   Medical History: Past Medical History:  Diagnosis Date  . Ascending aortic aneurysm (HCC)    a.  redo replacement of fusiform aneurysm of ascending aorta (complicated by pericardial hematoma s/p evacuation) in 2014.  2015 Contrast media allergy   . Coronary artery disease    a. s/p CABG x2 in 2014. b. s/p DES to SVG-LAD in 2017.  . H/O mechanical aortic valve replacement   . Hyperlipidemia   . Hypertension   . Ischemic cardiomyopathy    a. EF 40-45% in 2017 at time of NSTEMI, improved to normal in 2018.  . Non-STEMI (non-ST elevated myocardial infarction) (HCC)   . Premature atrial contractions   . PVC's (premature ventricular contractions)   . RA (rheumatoid arthritis) (HCC)   . S/P AVR (aortic valve replacement) 08/1999   St. Jude AVR for aortic insuff, Goal INR 2.5-3.5  . 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 11/2012) - complicated by pericardial hematoma --> subxiphoid pericardial exposure and evacuation b. Cath 06/2016: DES to SVG-LAD ostium  .  Sarcoid   . Second degree AV block    a. ? possibly seen in 12/2016 clinic visit - EKG not truly suggestive  . Severe anemia   . 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 s/p left subclavian bypass.    Medications:  Medications Prior to Admission  Medication Sig Dispense Refill Last Dose  . acetaminophen (TYLENOL) 500 MG tablet Take 500-1,000 mg by mouth every 6 (six) hours as needed (pain).   06/06/2018 at pm  . aspirin EC 81 MG EC tablet Take 1 tablet (81 mg total) by mouth daily. (Patient taking differently: Take 81 mg by mouth daily at 6 PM. )   06/06/2018 at pm  . atorvastatin (LIPITOR) 80 MG tablet TAKE 1 TABLET (80 MG TOTAL) BY MOUTH DAILY AT 6 PM. 90 tablet 2 06/06/2018 at pm  . Calcium Carb-Cholecalciferol (CALCIUM 600+D3 PO) Take 1 tablet by mouth daily.    06/06/2018 at am  . clopidogrel (PLAVIX) 75 MG tablet TAKE 1 TABLET BY MOUTH DAILY WITH BREAKFAST (Patient taking differently: Take 75 mg by mouth daily. ) 90 tablet 3 06/06/2018 at am  . Diclofenac Sodium 3 % GEL Place 1 application onto the skin 2 (two) times daily. To affected area. (Patient taking differently: Place 1 application onto the skin 2 (two) times daily as needed (arthritis pain). ) 100 g 0  2-3 weeks ago  . isosorbide mononitrate (IMDUR) 30 MG 24 hr tablet TAKE 1 TABLET BY MOUTH EVERY DAY (Patient taking differently: Take 30 mg by mouth daily. ) 30 tablet 6 06/06/2018 at am  . lisinopril (PRINIVIL,ZESTRIL) 2.5 MG tablet Take 1 tablet (2.5 mg total) by mouth daily. 90 tablet 4 06/06/2018 at am  . Multiple Vitamin (MULTIVITAMIN WITH MINERALS) TABS tablet Take 1 tablet by mouth daily.   06/06/2018 at am  . nitroGLYCERIN (NITROSTAT) 0.4 MG SL tablet PLACE 1 TABLET (0.4 MG TOTAL) UNDER THE TONGUE EVERY 5 (FIVE) MINUTES AS NEEDED FOR CHEST PAIN. 25 tablet 6 2-3 weeks agop  . ondansetron (ZOFRAN) 4 MG tablet Take 1 tablet (4 mg total) by mouth every 6 (six) hours. (Patient taking differently: Take 2-4 mg by mouth daily. ) 12 tablet 0  06/06/2018 at am  . pantoprazole (PROTONIX) 40 MG tablet TAKE 1 TABLET BY MOUTH EVERY DAY (Patient taking differently: Take 40 mg by mouth daily. ) 30 tablet 6 06/06/2018 at am  . polyvinyl alcohol (ARTIFICIAL TEARS) 1.4 % ophthalmic solution Place 1-2 drops into both eyes daily as needed for dry eyes.   06/07/2018 at am  . warfarin (COUMADIN) 5 MG tablet TAKE 1/2 TO 1 TABLET BY MOUTH DAILY OR AS DIRECTED BY COUMADIN CLINIC (Patient taking differently: Take 2.5-5 mg by mouth See admin instructions. Take 1/2 tablet (2.5 mg) by mouth on Monday and Thursday at 6pm, take 1 tablet (5 mg) on Sunday, Tuesday, Wednesday, Friday, Saturday at 6pm) 90 tablet 0 06/06/2018 at 1800  . diazepam (VALIUM) 2 MG tablet Take 1 tablet (2 mg total) by mouth every 8 (eight) hours as needed for muscle spasms. (Patient not taking: Reported on 06/07/2018) 10 tablet 0 Not Taking at Unknown time  . enoxaparin (LOVENOX) 80 MG/0.8ML injection Inject 0.8 mLs (80 mg total) into the skin every 12 (twelve) hours for 7 doses. (Patient not taking: Reported on 06/07/2018) 7 Syringe 0 Not Taking at Unknown time  . fluticasone (FLONASE) 50 MCG/ACT nasal spray Place 1 spray into both nostrils daily. (Patient not taking: Reported on 06/07/2018) 16 g 2 Not Taking at Unknown time    Assessment: 77 YOF with a mechanical AVR on warfarin at home here with pseudoaneurysm near her PCI groin site. Planning vascular procedure 9/8 with Dr. Durwin Nora. INR 2.0 this morning, patient is to receive Vitamin K 1mg  IV now and will recheck INR later this afternoon. Pharmacy consulted to start IV heparin when INR < 1.7. H/H low, Plt wnl.   Goal of Therapy:  Heparin level 0.3-0.5 units/ml Monitor platelets by anticoagulation protocol: Yes   Plan:  -F/u INR this afternoon -Will start IV heparin without bolus when INR < 1.7  , PharmD Clinical Pharmacist 06/08/2018 12:55 PM Please check AMION for all [facility] Pharmacy numbers

## 2018-06-09 ENCOUNTER — Encounter (HOSPITAL_COMMUNITY): Payer: Commercial Managed Care - PPO

## 2018-06-09 ENCOUNTER — Observation Stay (HOSPITAL_COMMUNITY): Payer: Commercial Managed Care - PPO

## 2018-06-09 ENCOUNTER — Encounter (HOSPITAL_COMMUNITY)
Admission: EM | Disposition: A | Discharge status: Home Health | Payer: Self-pay | Source: Ambulatory Visit | Attending: Cardiovascular Disease

## 2018-06-09 ENCOUNTER — Observation Stay (HOSPITAL_COMMUNITY): Payer: Commercial Managed Care - PPO | Admitting: Certified Registered"

## 2018-06-09 ENCOUNTER — Encounter (HOSPITAL_COMMUNITY): Payer: Self-pay | Admitting: Certified Registered"

## 2018-06-09 DIAGNOSIS — I25708 Atherosclerosis of coronary artery bypass graft(s), unspecified, with other forms of angina pectoris: Secondary | ICD-10-CM | POA: Diagnosis not present

## 2018-06-09 DIAGNOSIS — I5042 Chronic combined systolic (congestive) and diastolic (congestive) heart failure: Secondary | ICD-10-CM | POA: Diagnosis not present

## 2018-06-09 DIAGNOSIS — I724 Aneurysm of artery of lower extremity: Secondary | ICD-10-CM | POA: Diagnosis not present

## 2018-06-09 DIAGNOSIS — Z7901 Long term (current) use of anticoagulants: Secondary | ICD-10-CM | POA: Diagnosis not present

## 2018-06-09 DIAGNOSIS — B9620 Unspecified Escherichia coli [E. coli] as the cause of diseases classified elsewhere: Secondary | ICD-10-CM | POA: Diagnosis present

## 2018-06-09 DIAGNOSIS — I2510 Atherosclerotic heart disease of native coronary artery without angina pectoris: Secondary | ICD-10-CM | POA: Diagnosis not present

## 2018-06-09 DIAGNOSIS — M069 Rheumatoid arthritis, unspecified: Secondary | ICD-10-CM | POA: Diagnosis present

## 2018-06-09 DIAGNOSIS — N179 Acute kidney failure, unspecified: Secondary | ICD-10-CM | POA: Diagnosis not present

## 2018-06-09 DIAGNOSIS — E785 Hyperlipidemia, unspecified: Secondary | ICD-10-CM | POA: Diagnosis present

## 2018-06-09 DIAGNOSIS — I97638 Postprocedural hematoma of a circulatory system organ or structure following other circulatory system procedure: Secondary | ICD-10-CM | POA: Diagnosis not present

## 2018-06-09 DIAGNOSIS — I110 Hypertensive heart disease with heart failure: Secondary | ICD-10-CM | POA: Diagnosis not present

## 2018-06-09 DIAGNOSIS — I9789 Other postprocedural complications and disorders of the circulatory system, not elsewhere classified: Secondary | ICD-10-CM | POA: Diagnosis not present

## 2018-06-09 DIAGNOSIS — I729 Aneurysm of unspecified site: Secondary | ICD-10-CM | POA: Diagnosis present

## 2018-06-09 DIAGNOSIS — R008 Other abnormalities of heart beat: Secondary | ICD-10-CM | POA: Diagnosis present

## 2018-06-09 DIAGNOSIS — Z952 Presence of prosthetic heart valve: Secondary | ICD-10-CM | POA: Diagnosis not present

## 2018-06-09 DIAGNOSIS — I739 Peripheral vascular disease, unspecified: Secondary | ICD-10-CM | POA: Diagnosis present

## 2018-06-09 DIAGNOSIS — I491 Atrial premature depolarization: Secondary | ICD-10-CM | POA: Diagnosis not present

## 2018-06-09 DIAGNOSIS — Z9889 Other specified postprocedural states: Secondary | ICD-10-CM | POA: Diagnosis not present

## 2018-06-09 DIAGNOSIS — Z7982 Long term (current) use of aspirin: Secondary | ICD-10-CM | POA: Diagnosis not present

## 2018-06-09 DIAGNOSIS — T40605A Adverse effect of unspecified narcotics, initial encounter: Secondary | ICD-10-CM | POA: Diagnosis not present

## 2018-06-09 DIAGNOSIS — Z7902 Long term (current) use of antithrombotics/antiplatelets: Secondary | ICD-10-CM | POA: Diagnosis not present

## 2018-06-09 DIAGNOSIS — I493 Ventricular premature depolarization: Secondary | ICD-10-CM | POA: Diagnosis not present

## 2018-06-09 DIAGNOSIS — I25118 Atherosclerotic heart disease of native coronary artery with other forms of angina pectoris: Secondary | ICD-10-CM | POA: Diagnosis not present

## 2018-06-09 DIAGNOSIS — D500 Iron deficiency anemia secondary to blood loss (chronic): Secondary | ICD-10-CM | POA: Diagnosis not present

## 2018-06-09 DIAGNOSIS — D62 Acute posthemorrhagic anemia: Secondary | ICD-10-CM | POA: Diagnosis not present

## 2018-06-09 DIAGNOSIS — I255 Ischemic cardiomyopathy: Secondary | ICD-10-CM | POA: Diagnosis not present

## 2018-06-09 DIAGNOSIS — N390 Urinary tract infection, site not specified: Secondary | ICD-10-CM | POA: Diagnosis not present

## 2018-06-09 DIAGNOSIS — K219 Gastro-esophageal reflux disease without esophagitis: Secondary | ICD-10-CM | POA: Diagnosis present

## 2018-06-09 DIAGNOSIS — K851 Biliary acute pancreatitis without necrosis or infection: Secondary | ICD-10-CM | POA: Diagnosis not present

## 2018-06-09 DIAGNOSIS — D869 Sarcoidosis, unspecified: Secondary | ICD-10-CM | POA: Diagnosis present

## 2018-06-09 DIAGNOSIS — K5903 Drug induced constipation: Secondary | ICD-10-CM | POA: Diagnosis not present

## 2018-06-09 DIAGNOSIS — I252 Old myocardial infarction: Secondary | ICD-10-CM | POA: Diagnosis not present

## 2018-06-09 HISTORY — PX: FEMORAL-FEMORAL BYPASS GRAFT: SHX936

## 2018-06-09 LAB — POCT I-STAT 7, (LYTES, BLD GAS, ICA,H+H)
ACID-BASE EXCESS: 2 mmol/L (ref 0.0–2.0)
BICARBONATE: 26.3 mmol/L (ref 20.0–28.0)
CALCIUM ION: 1.21 mmol/L (ref 1.15–1.40)
HEMATOCRIT: 29 % — AB (ref 36.0–46.0)
Hemoglobin: 9.9 g/dL — ABNORMAL LOW (ref 12.0–15.0)
O2 SAT: 100 %
PH ART: 7.438 (ref 7.350–7.450)
POTASSIUM: 3.9 mmol/L (ref 3.5–5.1)
Patient temperature: 36
SODIUM: 139 mmol/L (ref 135–145)
TCO2: 27 mmol/L (ref 22–32)
pCO2 arterial: 38.5 mmHg (ref 32.0–48.0)
pO2, Arterial: 254 mmHg — ABNORMAL HIGH (ref 83.0–108.0)

## 2018-06-09 LAB — PROTIME-INR
INR: 1.82
INR: 1.59
Prothrombin Time: 20.9 seconds — ABNORMAL HIGH (ref 11.4–15.2)
Prothrombin Time: 18.8 seconds — ABNORMAL HIGH (ref 11.4–15.2)

## 2018-06-09 LAB — URINE CULTURE: Culture: >=100000 — AB

## 2018-06-09 LAB — CBC
HEMATOCRIT: 30.7 % — AB (ref 36.0–46.0)
Hemoglobin: 9.6 g/dL — ABNORMAL LOW (ref 12.0–15.0)
MCH: 25.7 pg — AB (ref 26.0–34.0)
MCHC: 31.3 g/dL (ref 30.0–36.0)
MCV: 82.1 fL (ref 78.0–100.0)
PLATELETS: 378 10*3/uL (ref 150–400)
RBC: 3.74 MIL/uL — ABNORMAL LOW (ref 3.87–5.11)
RDW: 17.4 % — ABNORMAL HIGH (ref 11.5–15.5)
WBC: 7.8 10*3/uL (ref 4.0–10.5)

## 2018-06-09 LAB — PREPARE RBC (CROSSMATCH)

## 2018-06-09 SURGERY — CREATION, BYPASS, ARTERIAL, FEMORAL TO FEMORAL, USING GRAFT
Anesthesia: General | Site: Groin | Laterality: Left

## 2018-06-09 MED ORDER — PROTAMINE SULFATE 10 MG/ML IV SOLN
INTRAVENOUS | Status: AC
  Filled 2018-06-09: qty 5

## 2018-06-09 MED ORDER — LIDOCAINE 2% (20 MG/ML) 5 ML SYRINGE
INTRAMUSCULAR | Status: DC | PRN
  Administered 2018-06-09: 80 mg via INTRAVENOUS

## 2018-06-09 MED ORDER — FENTANYL CITRATE (PF) 250 MCG/5ML IJ SOLN
INTRAMUSCULAR | Status: AC
  Filled 2018-06-09: qty 5

## 2018-06-09 MED ORDER — SODIUM CHLORIDE 0.9 % IV SOLN
INTRAVENOUS | Status: AC
  Filled 2018-06-09: qty 1.2

## 2018-06-09 MED ORDER — LACTATED RINGERS IV SOLN
INTRAVENOUS | Status: DC | PRN
  Administered 2018-06-09: 07:00:00 via INTRAVENOUS

## 2018-06-09 MED ORDER — LIDOCAINE HCL (PF) 1 % IJ SOLN
INTRAMUSCULAR | Status: AC
  Administered 2018-06-09: 17:00:00
  Filled 2018-06-09: qty 5

## 2018-06-09 MED ORDER — SODIUM CHLORIDE 0.9% IV SOLUTION: Freq: Once | INTRAVENOUS | Status: DC

## 2018-06-09 MED ORDER — SODIUM CHLORIDE 0.9 % IV SOLN
INTRAVENOUS | Status: DC
  Administered 2018-06-09 – 2018-06-17 (×9): via INTRAVENOUS

## 2018-06-09 MED ORDER — PROPOFOL 10 MG/ML IV BOLUS
INTRAVENOUS | Status: DC | PRN
  Administered 2018-06-09: 115 mg via INTRAVENOUS

## 2018-06-09 MED ORDER — SODIUM CHLORIDE 0.9 % IV SOLN
INTRAVENOUS | Status: DC | PRN
  Administered 2018-06-09: 25 ug/min via INTRAVENOUS

## 2018-06-09 MED ORDER — HYDROMORPHONE HCL 1 MG/ML IJ SOLN
INTRAMUSCULAR | Status: AC
  Filled 2018-06-09: qty 1

## 2018-06-09 MED ORDER — SODIUM CHLORIDE 0.9 % IV SOLN
500.0000 mL | Freq: Once | INTRAVENOUS | Status: AC | PRN
  Administered 2018-06-11: 500 mL via INTRAVENOUS

## 2018-06-09 MED ORDER — SUGAMMADEX SODIUM 200 MG/2ML IV SOLN
INTRAVENOUS | Status: DC | PRN
  Administered 2018-06-09: 100 mg via INTRAVENOUS

## 2018-06-09 MED ORDER — CEFAZOLIN SODIUM-DEXTROSE 2-4 GM/100ML-% IV SOLN
INTRAVENOUS | Status: AC
  Administered 2018-06-09: 2000 mg
  Filled 2018-06-09: qty 100

## 2018-06-09 MED ORDER — DOCUSATE SODIUM 100 MG PO CAPS
100.0000 mg | ORAL_CAPSULE | Freq: Every day | ORAL | Status: DC
  Administered 2018-06-10 – 2018-06-18 (×7): 100 mg via ORAL
  Filled 2018-06-09 (×8): qty 1

## 2018-06-09 MED ORDER — 0.9 % SODIUM CHLORIDE (POUR BTL) OPTIME
TOPICAL | Status: DC | PRN
  Administered 2018-06-09: 2000 mL

## 2018-06-09 MED ORDER — CLOPIDOGREL BISULFATE 75 MG PO TABS
75.0000 mg | ORAL_TABLET | Freq: Every day | ORAL | Status: DC
  Administered 2018-06-10 – 2018-06-18 (×9): 75 mg via ORAL
  Filled 2018-06-09 (×9): qty 1

## 2018-06-09 MED ORDER — PROPOFOL 10 MG/ML IV BOLUS
INTRAVENOUS | Status: AC
  Filled 2018-06-09: qty 20

## 2018-06-09 MED ORDER — OXYCODONE-ACETAMINOPHEN 5-325 MG PO TABS
1.0000 | ORAL_TABLET | ORAL | Status: DC | PRN
  Administered 2018-06-09 – 2018-06-12 (×10): 2 via ORAL
  Administered 2018-06-12: 1 via ORAL
  Administered 2018-06-13: 2 via ORAL
  Administered 2018-06-13 (×2): 1 via ORAL
  Administered 2018-06-13 – 2018-06-14 (×4): 2 via ORAL
  Administered 2018-06-15: 1 via ORAL
  Administered 2018-06-15 – 2018-06-16 (×2): 2 via ORAL
  Administered 2018-06-16: 1 via ORAL
  Administered 2018-06-17: 2 via ORAL
  Filled 2018-06-09 (×4): qty 2
  Filled 2018-06-09: qty 1
  Filled 2018-06-09 (×6): qty 2
  Filled 2018-06-09 (×2): qty 1
  Filled 2018-06-09 (×7): qty 2
  Filled 2018-06-09: qty 1
  Filled 2018-06-09 (×2): qty 2
  Filled 2018-06-09: qty 1

## 2018-06-09 MED ORDER — ALUM & MAG HYDROXIDE-SIMETH 200-200-20 MG/5ML PO SUSP
15.0000 mL | Freq: Four times a day (QID) | ORAL | Status: DC | PRN
  Administered 2018-06-09 – 2018-06-14 (×7): 15 mL via ORAL
  Filled 2018-06-09 (×7): qty 30

## 2018-06-09 MED ORDER — ONDANSETRON HCL 4 MG/2ML IJ SOLN
INTRAMUSCULAR | Status: DC | PRN
  Administered 2018-06-09: 4 mg via INTRAVENOUS

## 2018-06-09 MED ORDER — MIDAZOLAM HCL 2 MG/2ML IJ SOLN
INTRAMUSCULAR | Status: AC
  Filled 2018-06-09: qty 2

## 2018-06-09 MED ORDER — ONDANSETRON HCL 4 MG/2ML IJ SOLN
INTRAMUSCULAR | Status: AC
  Filled 2018-06-09: qty 2

## 2018-06-09 MED ORDER — HEPARIN (PORCINE) IN NACL 100-0.45 UNIT/ML-% IJ SOLN
750.0000 [IU]/h | INTRAMUSCULAR | Status: DC
  Administered 2018-06-09: 1000 [IU]/h via INTRAVENOUS
  Administered 2018-06-10: 750 [IU]/h via INTRAVENOUS
  Filled 2018-06-09 (×2): qty 250

## 2018-06-09 MED ORDER — HEPARIN SODIUM (PORCINE) 1000 UNIT/ML IJ SOLN
INTRAMUSCULAR | Status: DC | PRN
  Administered 2018-06-09: 7000 [IU] via INTRAVENOUS

## 2018-06-09 MED ORDER — HYDROMORPHONE HCL 1 MG/ML IJ SOLN
0.2500 mg | INTRAMUSCULAR | Status: DC | PRN
  Administered 2018-06-09: 0.25 mg via INTRAVENOUS

## 2018-06-09 MED ORDER — DEXAMETHASONE SODIUM PHOSPHATE 10 MG/ML IJ SOLN
INTRAMUSCULAR | Status: DC | PRN
  Administered 2018-06-09: 10 mg via INTRAVENOUS

## 2018-06-09 MED ORDER — EPHEDRINE SULFATE-NACL 50-0.9 MG/10ML-% IV SOSY
PREFILLED_SYRINGE | INTRAVENOUS | Status: DC | PRN
  Administered 2018-06-09: 5 mg via INTRAVENOUS

## 2018-06-09 MED ORDER — HEPARIN SODIUM (PORCINE) 1000 UNIT/ML IJ SOLN
INTRAMUSCULAR | Status: AC
  Filled 2018-06-09: qty 1

## 2018-06-09 MED ORDER — PROTAMINE SULFATE 10 MG/ML IV SOLN
INTRAVENOUS | Status: DC | PRN
  Administered 2018-06-09: 40 mg via INTRAVENOUS

## 2018-06-09 MED ORDER — SODIUM CHLORIDE 0.9 % IV SOLN
INTRAVENOUS | Status: DC | PRN
  Administered 2018-06-09: 07:00:00

## 2018-06-09 MED ORDER — CEFAZOLIN SODIUM-DEXTROSE 2-3 GM-%(50ML) IV SOLR
INTRAVENOUS | Status: DC | PRN
  Administered 2018-06-09: 2 g via INTRAVENOUS

## 2018-06-09 MED ORDER — MIDAZOLAM HCL 5 MG/5ML IJ SOLN
INTRAMUSCULAR | Status: DC | PRN
  Administered 2018-06-09 (×2): 1 mg via INTRAVENOUS

## 2018-06-09 MED ORDER — FENTANYL CITRATE (PF) 100 MCG/2ML IJ SOLN
INTRAMUSCULAR | Status: DC | PRN
  Administered 2018-06-09: 50 ug via INTRAVENOUS
  Administered 2018-06-09 (×2): 100 ug via INTRAVENOUS

## 2018-06-09 MED ORDER — CEFAZOLIN SODIUM-DEXTROSE 2-4 GM/100ML-% IV SOLN
2.0000 g | Freq: Three times a day (TID) | INTRAVENOUS | Status: AC
  Administered 2018-06-09 (×2): 2 g via INTRAVENOUS
  Filled 2018-06-09 (×2): qty 100

## 2018-06-09 MED ORDER — ROCURONIUM BROMIDE 10 MG/ML (PF) SYRINGE
PREFILLED_SYRINGE | INTRAVENOUS | Status: DC | PRN
  Administered 2018-06-09: 30 mg via INTRAVENOUS

## 2018-06-09 MED ORDER — MORPHINE SULFATE (PF) 2 MG/ML IV SOLN: 2.0000 mg | INTRAVENOUS | Status: DC | PRN

## 2018-06-09 SURGICAL SUPPLY — 48 items
ADH SKN CLS APL DERMABOND .7 (GAUZE/BANDAGES/DRESSINGS) ×1
CANISTER SUCT 3000ML PPV (MISCELLANEOUS) ×2 IMPLANT
CANNULA VESSEL 3MM 2 BLNT TIP (CANNULA) ×4 IMPLANT
CLIP VESOCCLUDE MED 24/CT (CLIP) ×2 IMPLANT
CLIP VESOCCLUDE SM WIDE 24/CT (CLIP) ×2 IMPLANT
DERMABOND ADVANCED (GAUZE/BANDAGES/DRESSINGS) ×1
DERMABOND ADVANCED .7 DNX12 (GAUZE/BANDAGES/DRESSINGS) IMPLANT
DRAIN CHANNEL 15F RND FF W/TCR (WOUND CARE) ×1 IMPLANT
ELECT REM PT RETURN 9FT ADLT (ELECTROSURGICAL) ×2
ELECTRODE REM PT RTRN 9FT ADLT (ELECTROSURGICAL) ×1 IMPLANT
EVACUATOR SILICONE 100CC (DRAIN) ×1 IMPLANT
GAUZE SPONGE 2X2 8PLY STRL LF (GAUZE/BANDAGES/DRESSINGS) IMPLANT
GLOVE BIO SURGEON STRL SZ 6.5 (GLOVE) ×2 IMPLANT
GLOVE BIO SURGEON STRL SZ7.5 (GLOVE) ×2 IMPLANT
GLOVE BIOGEL PI IND STRL 7.0 (GLOVE) IMPLANT
GLOVE BIOGEL PI IND STRL 8 (GLOVE) ×1 IMPLANT
GLOVE BIOGEL PI INDICATOR 7.0 (GLOVE) ×4
GLOVE BIOGEL PI INDICATOR 8 (GLOVE) ×1
GLOVE SURG SS PI 7.0 STRL IVOR (GLOVE) ×2 IMPLANT
GOWN STRL REUS W/ TWL LRG LVL3 (GOWN DISPOSABLE) ×3 IMPLANT
GOWN STRL REUS W/ TWL XL LVL3 (GOWN DISPOSABLE) IMPLANT
GOWN STRL REUS W/TWL LRG LVL3 (GOWN DISPOSABLE) ×4
GOWN STRL REUS W/TWL XL LVL3 (GOWN DISPOSABLE) ×4
INSERT FOGARTY SM (MISCELLANEOUS) ×1 IMPLANT
KIT BASIN OR (CUSTOM PROCEDURE TRAY) ×2 IMPLANT
KIT TURNOVER KIT B (KITS) ×2 IMPLANT
NS IRRIG 1000ML POUR BTL (IV SOLUTION) ×4 IMPLANT
PACK PERIPHERAL VASCULAR (CUSTOM PROCEDURE TRAY) ×2 IMPLANT
PAD ARMBOARD 7.5X6 YLW CONV (MISCELLANEOUS) ×4 IMPLANT
SPONGE GAUZE 2X2 STER 10/PKG (GAUZE/BANDAGES/DRESSINGS) ×1
SPONGE INTESTINAL PEANUT (DISPOSABLE) ×1 IMPLANT
SPONGE SURGIFOAM ABS GEL 100 (HEMOSTASIS) IMPLANT
STAPLER VISISTAT (STAPLE) IMPLANT
STOPCOCK 4 WAY LG BORE MALE ST (IV SETS) ×1 IMPLANT
SUT PROLENE 5 0 C 1 24 (SUTURE) ×7 IMPLANT
SUT PROLENE 6 0 BV (SUTURE) ×3 IMPLANT
SUT SILK 2 0 PERMA HAND 18 BK (SUTURE) IMPLANT
SUT SILK 2 0 SH (SUTURE) ×2 IMPLANT
SUT VIC AB 2-0 CTB1 (SUTURE) ×3 IMPLANT
SUT VIC AB 3-0 SH 27 (SUTURE) ×2
SUT VIC AB 3-0 SH 27X BRD (SUTURE) ×2 IMPLANT
SUT VICRYL 4-0 PS2 18IN ABS (SUTURE) ×3 IMPLANT
SYR 3ML LL SCALE MARK (SYRINGE) ×1 IMPLANT
TAPE CLOTH SURG 4X10 WHT LF (GAUZE/BANDAGES/DRESSINGS) ×1 IMPLANT
TOWEL GREEN STERILE (TOWEL DISPOSABLE) ×2 IMPLANT
TRAY FOLEY MTR SLVR 16FR STAT (SET/KITS/TRAYS/PACK) ×2 IMPLANT
UNDERPAD 30X30 (UNDERPADS AND DIAPERS) ×2 IMPLANT
WATER STERILE IRR 1000ML POUR (IV SOLUTION) ×2 IMPLANT

## 2018-06-09 NOTE — Anesthesia Procedure Notes (Signed)
Arterial Line Insertion Start/End9/05/2018 7:50 AM, 06/09/2018 7:55 AM Performed by: Lovie Chol, CRNA, CRNA  Preanesthetic checklist: patient identified, IV checked, site marked, risks and benefits discussed, surgical consent, monitors and equipment checked and pre-op evaluation Right, radial was placed Catheter size: 20 G Hand hygiene performed  and maximum sterile barriers used   Attempts: 1 Procedure performed without using ultrasound guided technique. Following insertion, dressing applied and Biopatch. Post procedure assessment: normal  Patient tolerated the procedure well with no immediate complications.

## 2018-06-09 NOTE — Anesthesia Postprocedure Evaluation (Signed)
Anesthesia Post Note  Patient: [name]  Procedure(s) Performed: REPAIR OF LEFT FEMORAL ARTERY PSEUDO ANEURYSM (Left Groin)     Patient location during evaluation: PACU Anesthesia Type: General Level of consciousness: awake Pain management: pain level controlled Vital Signs Assessment: post-procedure vital signs reviewed and stable Cardiovascular status: stable Anesthetic complications: no    Last Vitals:  Vitals:   06/09/18 1100 06/09/18 1129  BP: (!) 119/46 92/73  Pulse: 65 78  Resp: 19 16  Temp: 36.5 C 36.6 C  SpO2: 99% 100%    Last Pain:  Vitals:   06/09/18 1129  TempSrc: Oral  PainSc:                  [name]

## 2018-06-09 NOTE — Progress Notes (Signed)
ANTICOAGULATION CONSULT NOTE   Pharmacy Consult for heparin Indication: Mechanical AVR  Allergies  Allergen Reactions  . Contrast Media [Iodinated Diagnostic Agents] Hives, Itching and Swelling    Patient Measurements: Height: 5\' 7"  (170.2 cm) Weight: 159 lb 14.4 oz (72.5 kg) IBW/kg (Calculated) : 61.6 Heparin Dosing Weight: 70 kg  Vital Signs: Temp: 97.7 F (36.5 C) (09/08 1720) Temp Source: Oral (09/08 1720) BP: 120/43 (09/08 1720) Pulse Rate: 61 (09/08 1720)  Labs: Recent Labs    06/07/18 1406 06/07/18 2041 06/08/18 0235 06/08/18 0834 06/08/18 1628 06/09/18 0433 06/09/18 0440 06/09/18 0839 06/09/18 1553  HGB 9.8* 9.1*  --  9.6*  --   --  9.6* 9.9*  --   HCT 31.4* 28.6*  --  30.2*  --   --  30.7* 29.0*  --   PLT 350 314  --  328  --   --  378  --   --   LABPROT 23.3*  --  23.6* 23.3* 23.2* 20.9*  --   --  18.8*  INR 2.09  --  2.12 2.09 2.08 1.82  --   --  1.59  CREATININE 1.14*  --   --  1.00  --   --   --   --   --   TROPONINI  --  <0.03 <0.03 <0.03  --   --   --   --   --     Estimated Creatinine Clearance: 54.5 mL/min (by C-G formula based on SCr of 1 mg/dL).   Medical History: Past Medical History:  Diagnosis Date  . Ascending aortic aneurysm (HCC)    a.  redo replacement of fusiform aneurysm of ascending aorta (complicated by pericardial hematoma s/p evacuation) in 2014.  2015 Contrast media allergy   . Coronary artery disease    a. s/p CABG x2 in 2014. b. s/p DES to SVG-LAD in 2017.  . H/O mechanical aortic valve replacement   . Hyperlipidemia   . Hypertension   . Ischemic cardiomyopathy    a. EF 40-45% in 2017 at time of NSTEMI, improved to normal in 2018.  . Non-STEMI (non-ST elevated myocardial infarction) (HCC)   . Premature atrial contractions   . PVC's (premature ventricular contractions)   . RA (rheumatoid arthritis) (HCC)   . S/P AVR (aortic valve replacement) 08/1999   St. Jude AVR for aortic insuff, Goal INR 2.5-3.5  . S/P CABG x 2  11/2012   a. 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 11/2012) - complicated by pericardial hematoma --> subxiphoid pericardial exposure and evacuation b. Cath 06/2016: DES to SVG-LAD ostium  . Sarcoid   . Second degree AV block    a. ? possibly seen in 12/2016 clinic visit - EKG not truly suggestive  . Severe anemia   . 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 s/p left subclavian bypass.    Medications:  Medications Prior to Admission  Medication Sig Dispense Refill Last Dose  . acetaminophen (TYLENOL) 500 MG tablet Take 500-1,000 mg by mouth every 6 (six) hours as needed (pain).   06/06/2018 at pm  . aspirin EC 81 MG EC tablet Take 1 tablet (81 mg total) by mouth daily. (Patient taking differently: Take 81 mg by mouth daily at 6 PM. )   06/06/2018 at pm  . atorvastatin (LIPITOR) 80 MG tablet TAKE 1 TABLET (80 MG TOTAL) BY MOUTH DAILY AT 6 PM. 90 tablet 2 06/06/2018  at pm  . Calcium Carb-Cholecalciferol (CALCIUM 600+D3 PO) Take 1 tablet by mouth daily.    06/06/2018 at am  . clopidogrel (PLAVIX) 75 MG tablet TAKE 1 TABLET BY MOUTH DAILY WITH BREAKFAST (Patient taking differently: Take 75 mg by mouth daily. ) 90 tablet 3 06/06/2018 at am  . Diclofenac Sodium 3 % GEL Place 1 application onto the skin 2 (two) times daily. To affected area. (Patient taking differently: Place 1 application onto the skin 2 (two) times daily as needed (arthritis pain). ) 100 g 0 2-3 weeks ago  . isosorbide mononitrate (IMDUR) 30 MG 24 hr tablet TAKE 1 TABLET BY MOUTH EVERY DAY (Patient taking differently: Take 30 mg by mouth daily. ) 30 tablet 6 06/06/2018 at am  . lisinopril (PRINIVIL,ZESTRIL) 2.5 MG tablet Take 1 tablet (2.5 mg total) by mouth daily. 90 tablet 4 06/06/2018 at am  . Multiple Vitamin (MULTIVITAMIN WITH MINERALS) TABS tablet Take 1 tablet by mouth daily.   06/06/2018 at am  . nitroGLYCERIN (NITROSTAT) 0.4 MG SL tablet PLACE 1 TABLET (0.4 MG TOTAL) UNDER THE  TONGUE EVERY 5 (FIVE) MINUTES AS NEEDED FOR CHEST PAIN. 25 tablet 6 2-3 weeks agop  . ondansetron (ZOFRAN) 4 MG tablet Take 1 tablet (4 mg total) by mouth every 6 (six) hours. (Patient taking differently: Take 2-4 mg by mouth daily. ) 12 tablet 0 06/06/2018 at am  . pantoprazole (PROTONIX) 40 MG tablet TAKE 1 TABLET BY MOUTH EVERY DAY (Patient taking differently: Take 40 mg by mouth daily. ) 30 tablet 6 06/06/2018 at am  . polyvinyl alcohol (ARTIFICIAL TEARS) 1.4 % ophthalmic solution Place 1-2 drops into both eyes daily as needed for dry eyes.   06/07/2018 at am  . warfarin (COUMADIN) 5 MG tablet TAKE 1/2 TO 1 TABLET BY MOUTH DAILY OR AS DIRECTED BY COUMADIN CLINIC (Patient taking differently: Take 2.5-5 mg by mouth See admin instructions. Take 1/2 tablet (2.5 mg) by mouth on Monday and Thursday at 6pm, take 1 tablet (5 mg) on Sunday, Tuesday, Wednesday, Friday, Saturday at 6pm) 90 tablet 0 06/06/2018 at 1800  . diazepam (VALIUM) 2 MG tablet Take 1 tablet (2 mg total) by mouth every 8 (eight) hours as needed for muscle spasms. (Patient not taking: Reported on 06/07/2018) 10 tablet 0 Not Taking at Unknown time  . enoxaparin (LOVENOX) 80 MG/0.8ML injection Inject 0.8 mLs (80 mg total) into the skin every 12 (twelve) hours for 7 doses. (Patient not taking: Reported on 06/07/2018) 7 Syringe 0 Not Taking at Unknown time  . fluticasone (FLONASE) 50 MCG/ACT nasal spray Place 1 spray into both nostrils daily. (Patient not taking: Reported on 06/07/2018) 16 g 2 Not Taking at Unknown time    Assessment: 67 YOF with a mechanical AVR on warfarin at home here with pseudoaneurysm near her PCI groin site. Planning vascular procedure 9/8 with Dr. Durwin Nora. INR 1.59 this afternoon. Pharmacy consulted to start IV heparin when INR < 1.7. H/H low, Plt wnl.   Goal of Therapy:  Heparin level 0.3-0.5 units/ml Monitor platelets by anticoagulation protocol: Yes   Plan:  Heparin drip at 1000 units/hr, no bolus Heparin level in 6  hours Daily Heparin level and CBC Monitor for s/sx of bleeding.   [name] A. Jeanella Craze, PharmD, BCPS Clinical Pharmacist [place] Pager: [phone] Please utilize Amion for appropriate phone number to reach the unit pharmacist [facility])

## 2018-06-09 NOTE — Progress Notes (Signed)
Patient complaining of abdominal pain that radiates to the right side. MD on call paged. Order for Abdominal US received.

## 2018-06-09 NOTE — Transfer of Care (Signed)
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  Last Pain:  Vitals:   06/09/18 0406  TempSrc: Oral  PainSc:       Patients Stated Pain Goal: 0 (06/08/18 2150)  Complications: No apparent anesthesia complications

## 2018-06-09 NOTE — Progress Notes (Signed)
Pt s/p repair left femoral artery pseudoaneurysm this morning.  May restart heparin at 1730 06/09/18 if INR < 1.7.    Doreatha Massed, [facility] 06/09/2018 9:30 AM

## 2018-06-09 NOTE — Anesthesia Procedure Notes (Signed)
Procedure Name: Intubation Date/Time: 06/09/2018 7:40 AM Performed by: Moshe Salisbury, CRNA Pre-anesthesia Checklist: Patient identified, Emergency Drugs available, Suction available and Patient being monitored Patient Re-evaluated:Patient Re-evaluated prior to induction Oxygen Delivery Method: Circle System Utilized Preoxygenation: Pre-oxygenation with 100% oxygen Induction Type: IV induction Ventilation: Mask ventilation without difficulty Laryngoscope Size: Mac and 3 Grade View: Grade I Tube type: Oral Tube size: 7.5 mm Number of attempts: 1 Airway Equipment and Method: Stylet Placement Confirmation: ETT inserted through vocal cords under direct vision,  positive ETCO2 and breath sounds checked- equal and bilateral Secured at: 20 cm Tube secured with: Tape Dental Injury: Teeth and Oropharynx as per pre-operative assessment

## 2018-06-09 NOTE — Op Note (Signed)
    NAME: [name]    MRN: [id number] DOB: 09/19/1951    DATE OF OPERATION: 06/09/2018  PREOP DIAGNOSIS:    Left femoral artery pseudoaneurysm  POSTOP DIAGNOSIS:    Same  PROCEDURE:    Repair of left femoral artery pseudoaneurysm  SURGEON: Di Kindle. Edilia Bo, MD, FACS  ASSIST: Doreatha Massed, PA  ANESTHESIA: General  EBL: 100 cc  INDICATIONS:    [name] is a 67 y.o. female who had an intra-aortic balloon pump placed on the left side approximately 3 weeks ago and developed a pseudoaneurysm.  This was quite painful.  The neck of the aneurysm was short and I did not think the patient was a good candidate for compression or thrombin injection.  For this reason I recommended surgical repair.  Her INR was 1.8 her Coumadin had been held for 3 days.  FINDINGS:   The stick was at the junction of the proximal common femoral artery and the distal external iliac artery.  This was repaired with a 5-0 Prolene suture.  TECHNIQUE:   The patient was taken to the operating room and received a general anesthetic.  The left groin lower abdomen and thigh were prepped and draped in usual sterile fashion.  I elected to make an oblique incision just above the inguinal crease.  The dissection was carried down to the pseudoaneurysm which was quite large.  It was very difficult to get proximal control as the aneurysm extended up to the inguinal ligament.  Try to get distal control also but the aneurysm was essentially in the way.  In dissecting the aneurysm free there was some bleeding noted therefore I opened the aneurysm and held pressure over the area that was bleeding in order that I could dissect this out better to visualize this this was then sewn with a 5-0 Prolene suture.  However this was on the scar tissue on top of the artery and therefore I next proceeded to dissected out the artery fully so that I could repair the hole more precisely.  I was able to get control of the common  femoral artery distally.  This was well above the deep femoral artery takeoff.  I then was able to get control of the external iliac artery beneath the inguinal ligament.  Once I had proximal distal control the patient was heparinized.  The artery was clamped proximally and distally and all of the scar tissue excised from the anterior wall of the artery.  The hole was then repaired with a 5-0 Prolene suture.  There was good hemostasis.  Hemostasis was obtained in the wound.  A 15 Blake drain was placed.  The wound was then closed with a deep layer of 2-0 Vicryl, the subcutaneous layer with 3-0 Vicryl and the skin closed with 4-0 Vicryl.  Dermabond was applied.  The patient tolerated procedure well transferred to the recovery room in stable condition.  All needle and sponge counts were correct.  [name], MD, FACS [facility]  DATE OF DICTATION:   06/09/2018

## 2018-06-09 NOTE — Interval H&P Note (Signed)
History and Physical Interval Note:  06/09/2018 7:19 AM  [name]  has presented today for surgery, with the diagnosis of femoral artery anyerisum  The various methods of treatment have been discussed with the patient and family. After consideration of risks, benefits and other options for treatment, the patient has consented to  Procedure(s): REPAIR OF LEFT FEMORAL ARTERY PSEUDO ANEURYSM (Left) as a surgical intervention .  The patient's history has been reviewed, patient examined, no change in status, stable for surgery.  I have reviewed the patient's chart and labs.  Questions were answered to the patient's satisfaction.     [name]

## 2018-06-09 NOTE — Anesthesia Preprocedure Evaluation (Addendum)
Anesthesia Evaluation  Patient identified by MRN, date of birth, ID band Patient awake    Reviewed: Allergy & Precautions, NPO status , Patient's Chart, lab work & pertinent test results  Airway Mallampati: I  TM Distance: >3 FB Neck ROM: Full    Dental  (+) Teeth Intact, Dental Advisory Given   Pulmonary neg pulmonary ROS,    breath sounds clear to auscultation       Cardiovascular hypertension, + angina + CAD, + Past MI, + Peripheral Vascular Disease and + DOE  + dysrhythmias  Rhythm:Regular Rate:Normal     Neuro/Psych    GI/Hepatic Neg liver ROS, History noted. CG   Endo/Other  negative endocrine ROS  Renal/GU negative Renal ROS     Musculoskeletal  (+) Arthritis ,   Abdominal   Peds  Hematology  (+) anemia ,   Anesthesia Other Findings   Reproductive/Obstetrics                            Anesthesia Physical Anesthesia Plan  ASA: III  Anesthesia Plan: General   Post-op Pain Management:    Induction: Intravenous  PONV Risk Score and Plan: 3 and Ondansetron, Dexamethasone and Midazolam  Airway Management Planned: Oral ETT  Additional Equipment: None  Intra-op Plan:   Post-operative Plan: Extubation in OR  Informed Consent: I have reviewed the patients History and Physical, chart, labs and discussed the procedure including the risks, benefits and alternatives for the proposed anesthesia with the patient or authorized representative who has indicated his/her understanding and acceptance.   Dental advisory given  Plan Discussed with: Anesthesiologist, Surgeon and CRNA  Anesthesia Plan Comments:        Anesthesia Quick Evaluation

## 2018-06-10 ENCOUNTER — Other Ambulatory Visit: Payer: Self-pay

## 2018-06-10 ENCOUNTER — Encounter (HOSPITAL_COMMUNITY): Payer: Self-pay | Admitting: Vascular Surgery

## 2018-06-10 ENCOUNTER — Inpatient Hospital Stay (HOSPITAL_COMMUNITY): Payer: Commercial Managed Care - PPO

## 2018-06-10 DIAGNOSIS — I25118 Atherosclerotic heart disease of native coronary artery with other forms of angina pectoris: Secondary | ICD-10-CM

## 2018-06-10 DIAGNOSIS — Z9889 Other specified postprocedural states: Secondary | ICD-10-CM

## 2018-06-10 LAB — CBC
HCT: 24.6 % — ABNORMAL LOW (ref 36.0–46.0)
HEMATOCRIT: 27.7 % — AB (ref 36.0–46.0)
HEMOGLOBIN: 8.7 g/dL — AB (ref 12.0–15.0)
Hemoglobin: 8 g/dL — ABNORMAL LOW (ref 12.0–15.0)
MCH: 25.9 pg — ABNORMAL LOW (ref 26.0–34.0)
MCH: 26.5 pg (ref 26.0–34.0)
MCHC: 31.4 g/dL (ref 30.0–36.0)
MCHC: 32.5 g/dL (ref 30.0–36.0)
MCV: 82.4 fL (ref 78.0–100.0)
MCV: 81.5 fL (ref 78.0–100.0)
PLATELETS: 368 10*3/uL (ref 150–400)
Platelets: 354 10*3/uL (ref 150–400)
RBC: 3.02 MIL/uL — ABNORMAL LOW (ref 3.87–5.11)
RBC: 3.36 MIL/uL — AB (ref 3.87–5.11)
RDW: 17.8 % — ABNORMAL HIGH (ref 11.5–15.5)
RDW: 17.6 % — ABNORMAL HIGH (ref 11.5–15.5)
WBC: 13.5 10*3/uL — ABNORMAL HIGH (ref 4.0–10.5)
WBC: 10.2 10*3/uL (ref 4.0–10.5)

## 2018-06-10 LAB — BASIC METABOLIC PANEL
ANION GAP: 9 (ref 5–15)
BUN: 10 mg/dL (ref 8–23)
CHLORIDE: 108 mmol/L (ref 98–111)
CO2: 23 mmol/L (ref 22–32)
CREATININE: 0.97 mg/dL (ref 0.44–1.00)
Calcium: 8.8 mg/dL — ABNORMAL LOW (ref 8.9–10.3)
GFR calc Af Amer: >60 mL/min (ref >60)
GFR calc non Af Amer: 60 mL/min — ABNORMAL LOW (ref >60)
Glucose, Bld: 139 mg/dL — ABNORMAL HIGH (ref 70–99)
POTASSIUM: 4.5 mmol/L (ref 3.5–5.1)
SODIUM: 140 mmol/L (ref 135–145)

## 2018-06-10 LAB — AMYLASE: AMYLASE: 1277 U/L — AB (ref 28–100)

## 2018-06-10 LAB — PROTIME-INR
INR: 1.56
PROTHROMBIN TIME: 18.5 s — AB (ref 11.4–15.2)

## 2018-06-10 LAB — HEPARIN LEVEL (UNFRACTIONATED)
HEPARIN UNFRACTIONATED: 0.54 [IU]/mL (ref 0.30–0.70)
Heparin Unfractionated: 0.59 IU/mL (ref 0.30–0.70)

## 2018-06-10 LAB — LIPASE, BLOOD: Lipase: 1217 U/L — ABNORMAL HIGH (ref 11–51)

## 2018-06-10 LAB — MAGNESIUM: Magnesium: 2.3 mg/dL (ref 1.7–2.4)

## 2018-06-10 MED ORDER — FAMOTIDINE 20 MG PO TABS
20.0000 mg | ORAL_TABLET | Freq: Every day | ORAL | Status: DC
  Administered 2018-06-10 – 2018-06-18 (×9): 20 mg via ORAL
  Filled 2018-06-10 (×9): qty 1

## 2018-06-10 MED ORDER — WARFARIN SODIUM 5 MG PO TABS: 5.0000 mg | ORAL_TABLET | Freq: Once | ORAL | Status: DC

## 2018-06-10 MED ORDER — WARFARIN - PHARMACIST DOSING INPATIENT: Freq: Every day | Status: DC

## 2018-06-10 NOTE — Progress Notes (Signed)
PT Cancellation Note  Patient Details Name: [place] MRN: [id number] DOB: 03-Feb-1951   Cancelled Treatment:    Reason Eval/Treat Not Completed: Medical issues which prohibited therapy. Pt has had oozing from site of JP drain removal. Nursing asked to defer ambulation.   Angelina Ok Maycok 06/10/2018, 4:05 PM Skip Mayer PT Acute Rehabilitation Services Pager [phone] Office [phone]

## 2018-06-10 NOTE — Progress Notes (Signed)
ANTICOAGULATION CONSULT NOTE   Pharmacy Consult for heparin + warfarin Indication: Mechanical AVR  Allergies  Allergen Reactions  . Contrast Media [Iodinated Diagnostic Agents] Hives, Itching and Swelling    Patient Measurements: Height: 5\' 7"  (170.2 cm) Weight: 159 lb 9.6 oz (72.4 kg) IBW/kg (Calculated) : 61.6 Heparin Dosing Weight: 70 kg  Vital Signs: Temp: 98.4 F (36.9 C) (09/09 1953) Temp Source: Oral (09/09 1953) BP: 94/43 (09/09 1953) Pulse Rate: 77 (09/09 1953)  Labs: Recent Labs    06/07/18 2041  06/08/18 0235 06/08/18 08/08/18  06/09/18 0433 06/09/18 0440 06/09/18 0839 06/09/18 1553 06/10/18 0016 06/10/18 0742 06/10/18 1014 06/10/18 1807  HGB 9.1*  --   --  9.6*  --   --  9.6* 9.9*  --  8.7*  --  8.0*  --   HCT 28.6*  --   --  30.2*  --   --  30.7* 29.0*  --  27.7*  --  24.6*  --   PLT 314  --   --  328  --   --  378  --   --  354  --  368  --   LABPROT  --    < > 23.6* 23.3*   < > 20.9*  --   --  18.8* 18.5*  --   --   --   INR  --    < > 2.12 2.09   < > 1.82  --   --  1.59 1.56  --   --   --   HEPARINUNFRC  --   --   --   --   --   --   --   --   --  <0.10* 0.54  --  0.59  CREATININE  --   --   --  1.00  --   --   --   --   --  0.97  --   --   --   TROPONINI <0.03  --  <0.03 <0.03  --   --   --   --   --   --   --   --   --    < > = values in this interval not displayed.    Estimated Creatinine Clearance: 56.2 mL/min (by C-G formula based on SCr of 0.97 mg/dL).   Medical History: Past Medical History:  Diagnosis Date  . Ascending aortic aneurysm (HCC)    a.  redo replacement of fusiform aneurysm of ascending aorta (complicated by pericardial hematoma s/p evacuation) in 2014.  2015 Contrast media allergy   . Coronary artery disease    a. s/p CABG x2 in 2014. b. s/p DES to SVG-LAD in 2017.  . H/O mechanical aortic valve replacement   . Hyperlipidemia   . Hypertension   . Ischemic cardiomyopathy    a. EF 40-45% in 2017 at time of NSTEMI, improved to  normal in 2018.  . Non-STEMI (non-ST elevated myocardial infarction) (HCC)   . Premature atrial contractions   . PVC's (premature ventricular contractions)   . RA (rheumatoid arthritis) (HCC)   . S/P AVR (aortic valve replacement) 08/1999   [employer] AVR for aortic insuff, Goal INR 2.5-3.5  . 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 11/2012) - complicated by pericardial hematoma --> subxiphoid pericardial exposure and evacuation b. Cath 06/2016: DES to SVG-LAD ostium  . Sarcoid   .  Second degree AV block    a. ? possibly seen in 12/2016 clinic visit - EKG not truly suggestive  . Severe anemia   . 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 s/p left subclavian bypass.    Medications:  Medications Prior to Admission  Medication Sig Dispense Refill Last Dose  . acetaminophen (TYLENOL) 500 MG tablet Take 500-1,000 mg by mouth every 6 (six) hours as needed (pain).   06/06/2018 at pm  . aspirin EC 81 MG EC tablet Take 1 tablet (81 mg total) by mouth daily. (Patient taking differently: Take 81 mg by mouth daily at 6 PM. )   06/06/2018 at pm  . atorvastatin (LIPITOR) 80 MG tablet TAKE 1 TABLET (80 MG TOTAL) BY MOUTH DAILY AT 6 PM. 90 tablet 2 06/06/2018 at pm  . Calcium Carb-Cholecalciferol (CALCIUM 600+D3 PO) Take 1 tablet by mouth daily.    06/06/2018 at am  . clopidogrel (PLAVIX) 75 MG tablet TAKE 1 TABLET BY MOUTH DAILY WITH BREAKFAST (Patient taking differently: Take 75 mg by mouth daily. ) 90 tablet 3 06/06/2018 at am  . Diclofenac Sodium 3 % GEL Place 1 application onto the skin 2 (two) times daily. To affected area. (Patient taking differently: Place 1 application onto the skin 2 (two) times daily as needed (arthritis pain). ) 100 g 0 2-3 weeks ago  . isosorbide mononitrate (IMDUR) 30 MG 24 hr tablet TAKE 1 TABLET BY MOUTH EVERY DAY (Patient taking differently: Take 30 mg by mouth daily. ) 30 tablet 6 06/06/2018 at am   . lisinopril (PRINIVIL,ZESTRIL) 2.5 MG tablet Take 1 tablet (2.5 mg total) by mouth daily. 90 tablet 4 06/06/2018 at am  . Multiple Vitamin (MULTIVITAMIN WITH MINERALS) TABS tablet Take 1 tablet by mouth daily.   06/06/2018 at am  . nitroGLYCERIN (NITROSTAT) 0.4 MG SL tablet PLACE 1 TABLET (0.4 MG TOTAL) UNDER THE TONGUE EVERY 5 (FIVE) MINUTES AS NEEDED FOR CHEST PAIN. 25 tablet 6 2-3 weeks agop  . ondansetron (ZOFRAN) 4 MG tablet Take 1 tablet (4 mg total) by mouth every 6 (six) hours. (Patient taking differently: Take 2-4 mg by mouth daily. ) 12 tablet 0 06/06/2018 at am  . pantoprazole (PROTONIX) 40 MG tablet TAKE 1 TABLET BY MOUTH EVERY DAY (Patient taking differently: Take 40 mg by mouth daily. ) 30 tablet 6 06/06/2018 at am  . polyvinyl alcohol (ARTIFICIAL TEARS) 1.4 % ophthalmic solution Place 1-2 drops into both eyes daily as needed for dry eyes.   06/07/2018 at am  . warfarin (COUMADIN) 5 MG tablet TAKE 1/2 TO 1 TABLET BY MOUTH DAILY OR AS DIRECTED BY COUMADIN CLINIC (Patient taking differently: Take 2.5-5 mg by mouth See admin instructions. Take 1/2 tablet (2.5 mg) by mouth on Monday and Thursday at 6pm, take 1 tablet (5 mg) on Sunday, Tuesday, Wednesday, Friday, Saturday at 6pm) 90 tablet 0 06/06/2018 at 1800  . diazepam (VALIUM) 2 MG tablet Take 1 tablet (2 mg total) by mouth every 8 (eight) hours as needed for muscle spasms. (Patient not taking: Reported on 06/07/2018) 10 tablet 0 Not Taking at Unknown time  . enoxaparin (LOVENOX) 80 MG/0.8ML injection Inject 0.8 mLs (80 mg total) into the skin every 12 (twelve) hours for 7 doses. (Patient not taking: Reported on 06/07/2018) 7 Syringe 0 Not Taking at Unknown time  . fluticasone (FLONASE) 50 MCG/ACT nasal spray Place 1 spray into both nostrils daily. (Patient not taking: Reported on 06/07/2018) 16 g 2 Not Taking at Unknown  time    Assessment:  42 YOF with a mechanical AVR on warfarin at home here with pseudoaneurysm near her PCI groin site now s/p repair  with VVS 9/8.   JP drain site continues to ooze since removed this morning. Pressure was held this evening for 20 minutes by nursing - will continue to monitor for any increased oozing/bleeding. Hgb continues to drift down to 8, plt 368 on last check this afternoon. Heparin level came back elevated at 0.59, on 900 units/hr. Warfarin remains on hold. No infusion issues  Home warfarin dose = 2.5mg  Mon/Thurs; 5mg  all other bdays  Goal of Therapy:  Heparin level 0.3-0.5 units/ml Monitor platelets by anticoagulation protocol: Yes   Plan:  -Reduce IV heparin to 750 units/h -Recheck 6hr heparin level -Continue to hold warfarin -Daily INR, heparin level, CBC  , PharmD Clinical Pharmacist  Pager: [phone] Phone: [phone] Please check AMION for all [facility] Pharmacy numbers 06/10/2018

## 2018-06-10 NOTE — Progress Notes (Signed)
JP/Blake drain removed per MD order without difficulty.  No bleeding noted at the time of removal.  Will continue to monitor.

## 2018-06-10 NOTE — Progress Notes (Signed)
   VASCULAR SURGERY ASSESSMENT & PLAN:   1 Day Post-Op s/p: Repair of left femoral artery pseudoaneurysm.  JP drained only 10 cc last shift.  This can be discontinued.  She is having some epigastric pain.  Sounds like this could potentially be reflux.   Okay to ambulate.  She can restart her Coumadin.  She is on intravenous heparin.  SUBJECTIVE:   Complains of Some Epigastric Pain.  PHYSICAL EXAM:   Vitals:   06/09/18 1129 06/09/18 1720 06/09/18 2100 06/10/18 0300  BP: 92/73 (!) 120/43 (!) 113/47   Pulse: 78 61 (!) 43   Resp: 16 13 15    Temp: 97.9 F (36.6 C) 97.7 F (36.5 C)  98 F (36.7 C)  TempSrc: Oral Oral    SpO2: 100% 99% 99%   Weight:    72.4 kg  Height:       Her left groin incision looks fine. She had some drainage around the JP exit site but this is now stopped. She has a palpable left dorsalis pedis pulse.  LABS:   Lab Results  Component Value Date   WBC 10.2 06/10/2018   HGB 8.7 (L) 06/10/2018   HCT 27.7 (L) 06/10/2018   MCV 82.4 06/10/2018   PLT 354 06/10/2018   Lab Results  Component Value Date   CREATININE 0.97 06/10/2018   Lab Results  Component Value Date   INR 1.56 06/10/2018    PROBLEM LIST:    Active Problems:   Pseudoaneurysm (HCC)   CURRENT MEDS:   . sodium chloride   Intravenous Once  . aspirin EC  81 mg Oral Daily  . atorvastatin  80 mg Oral q1800  . clopidogrel  75 mg Oral Q breakfast  . docusate sodium  100 mg Oral Daily  . isosorbide mononitrate  30 mg Oral Daily  . pantoprazole  40 mg Oral Daily    08/10/2018 Beeper: [name] Office: [phone] 06/10/2018

## 2018-06-10 NOTE — Progress Notes (Signed)
Pt bandage saturated; unclogged JP drain; cleaned and re-dressed. Will continue to monitor.

## 2018-06-10 NOTE — Progress Notes (Addendum)
Progress Note  Patient Name: [name] Date of Encounter: 06/10/2018  Primary Cardiologist: Chrystie Nose, MD   Subjective   Pt is complaining of epigastric pain and left upper abdominal pain.   Inpatient Medications    Scheduled Meds: . sodium chloride   Intravenous Once  . aspirin EC  81 mg Oral Daily  . atorvastatin  80 mg Oral q1800  . clopidogrel  75 mg Oral Q breakfast  . docusate sodium  100 mg Oral Daily  . isosorbide mononitrate  30 mg Oral Daily  . pantoprazole  40 mg Oral Daily   Continuous Infusions: . sodium chloride    . sodium chloride 50 mL/hr at 06/09/18 1336  . heparin 1,000 Units/hr (06/09/18 1859)   PRN Meds: sodium chloride, acetaminophen, alum & mag hydroxide-simeth, magnesium hydroxide, morphine injection, nitroGLYCERIN, ondansetron (ZOFRAN) IV, oxyCODONE-acetaminophen   Vital Signs    Vitals:   06/09/18 1129 06/09/18 1720 06/09/18 2100 06/10/18 0300  BP: 92/73 (!) 120/43 (!) 113/47   Pulse: 78 61 (!) 43   Resp: 16 13 15    Temp: 97.9 F (36.6 C) 97.7 F (36.5 C)  98 F (36.7 C)  TempSrc: Oral Oral    SpO2: 100% 99% 99%   Weight:    72.4 kg  Height:        Intake/Output Summary (Last 24 hours) at 06/10/2018 0854 Last data filed at 06/10/2018 0444 Gross per 24 hour  Intake 970 ml  Output 1420 ml  Net -450 ml   Filed Weights   06/08/18 0456 06/09/18 0406 06/10/18 0300  Weight: 70.6 kg 72.5 kg 72.4 kg    Telemetry    Bigeminy and frequent PVC runs - Personally Reviewed  ECG    No new tracings - Personally Reviewed  Physical Exam   GEN: No acute distress.   Neck: No JVD Cardiac: RRR, aortic valve click, aortic murmur, sternotomy scar Respiratory: Clear to auscultation bilaterally. GI: Soft, nontender, non-distended  MS: No edema; No deformity. Neuro:  Nonfocal  Psych: Normal affect   Labs    Chemistry Recent Labs  Lab 06/07/18 1406 06/08/18 0834 06/09/18 0839 06/10/18 0016  NA 140 138 139 140  K 4.1 4.0 3.9  4.5  CL 107 105  --  108  CO2 25 24  --  23  GLUCOSE 107* 131*  --  139*  BUN 15 11  --  10  CREATININE 1.14* 1.00  --  0.97  CALCIUM 9.0 8.8*  --  8.8*  PROT 6.4*  --   --   --   ALBUMIN 3.2*  --   --   --   AST 34  --   --   --   ALT 32  --   --   --   ALKPHOS 123  --   --   --   BILITOT 1.2  --   --   --   GFRNONAA 49* 58*  --  60*  GFRAA 57* >60  --  >60  ANIONGAP 8 9  --  9     Hematology Recent Labs  Lab 06/08/18 0834 06/09/18 0440 06/09/18 0839 06/10/18 0016  WBC 7.9 7.8  --  10.2  RBC 3.64* 3.74*  --  3.36*  HGB 9.6* 9.6* 9.9* 8.7*  HCT 30.2* 30.7* 29.0* 27.7*  MCV 83.0 82.1  --  82.4  MCH 26.4 25.7*  --  25.9*  MCHC 31.8 31.3  --  31.4  RDW 17.6* 17.4*  --  17.8*  PLT 328 378  --  354    Cardiac Enzymes Recent Labs  Lab 06/07/18 2041 06/08/18 0235 06/08/18 0834  TROPONINI <0.03 <0.03 <0.03    Recent Labs  Lab 06/07/18 1414  TROPIPOC 0.00     BNPNo results for input(s): BNP, PROBNP in the last 168 hours.   DDimer No results for input(s): DDIMER in the last 168 hours.   Radiology    Dg Abd 1 View  Result Date: 06/09/2018 CLINICAL DATA:  Abdominal pain. EXAM: ABDOMEN - 1 VIEW COMPARISON:  None. FINDINGS: Gas and stool throughout the colon. No small or large bowel distention. Calcifications in the upper abdomen medially may represent adrenal gland calcifications. No radiopaque stones. Vascular calcifications. Degenerative changes in the spine. Soft tissue contours appear intact. IMPRESSION: Nonobstructive bowel gas pattern.  Diffusely stool-filled colon. Electronically Signed   By: Burman Nieves M.D.   On: 06/09/2018 02:50   US Abdomen Complete  Result Date: 06/09/2018 CLINICAL DATA:  Abdominal pain radiating to the right side. EXAM: ABDOMEN ULTRASOUND COMPLETE COMPARISON:  None. FINDINGS: Gallbladder: Multiple small stones and sludge layering in the dependent gallbladder. Largest stone measuring about 4 mm. No gallbladder wall thickening. Murphy's  sign is negative. Common bile duct: Diameter: 6 mm, normal Liver: No focal lesion identified. Within normal limits in parenchymal echogenicity. Portal vein is patent on color Doppler imaging with normal direction of blood flow towards the liver. IVC: No abnormality visualized. Pancreas: Limited visualization due to bowel gas. Spleen: Size and appearance within normal limits. Right Kidney: Length: 13.1 cm. Mild right hydronephrosis. Cause is indeterminate. Normal parenchymal echotexture and thickness. No focal lesions identified. Left Kidney: Length: 10.4 cm. Echogenicity within normal limits. No mass or hydronephrosis visualized. Abdominal aorta: No aneurysm visualized. Other findings: None. IMPRESSION: 1. Small stones and sludge in the gallbladder. No additional changes to suggest cholecystitis. 2. Mild right renal hydronephrosis of uncertain etiology. Electronically Signed   By: Burman Nieves M.D.   On: 06/09/2018 02:48    Cardiac Studies   L groin u/s 06/07/18:  An area with well defined borders measuring 2.6 cm x 2.9 cm was visualized arising off of the distal external/proximal common femoral artery with ultrasound characteristics of a pseudoaneurysm. The neck measures approximately 0.3 cm wide and 0.7 cm long.   Cardiac catheterization 05/20/2018:  Ost LM to LM lesion is 95% stenosed.  Ost LAD to Prox LAD lesion is 100% stenosed.  Ost 1st Mrg lesion is 90% stenosed.  Ost RCA to Prox RCA lesion is 100% stenosed. Not injected today.  SVG Y-graft to LAD and circumflex. Possible Origin lesion is 90% stenosed of the prior SVG stent from 2017.  Normal right heart pressures.  Ao sat 98%, PA sat 76%, CO 6.4 L/min; CI 3.5; mean PA 13 mm Hg; mean PCWP 8 mm Hg  Thoracic aortic tortuosity made catheter manipulation more difficult.  Chest pain at the conclusion of the procedure was relieved with IV NTG. Significant ST depressions when she had CP and increased BP . Will need close monitoring of  BP.  Possible severe in-stent restenosis of 4.0 mm Synergy stent from 2017.Severe native disease including left main. There is a long segment of stent hanging outside of the graft, which makes it difficult to selectively engage the vessel and know if the portion of the stent inside the vessel is patent.  Prox LAD is occluded. Fixing native left main would not help anterior wall.   If Graft stent has severe  restenosis once, and restenting may not give a different longterm result.   Need to know LV function. Check echo. Would see if she is a redo candidate as LIMA was not used the last surgery.   Will check options for revascularization.   Higher contrast load due to difficulty finding grafts Recheck kidney function in AM.  Consider CT scan to see if we can better define patency of the stent.   Given her chest pain and ECG changes, I suspect there is significant disease in the stent causing her ischemia.   Cardiac catheterization 05/21/2018:  Ost LM lesion is 95% stenosed.  A drug-eluting stent was successfully placed using a STENT RESOLUTE ONYX 5.0X12.  Post intervention, there is a 0% residual stenosis.  IABP placed preprocedure for left main intervention.  Prox LAD lesion is 10% stenosed.   Recommend to resume Warfarin, at currently prescribed dose and frequency, on 05/21/18. Recommend concurrent antiplatelet therapy of Aspirin 81mg  daily for 1 monthand Clopidogrel 75mg  daily for indefinitely, as long as there are no bleeding problems.   Patient Profile     67 y.o. female with a hx of HTN, CAD, dilated ascending aorta at cath 11/03/12 s/p root andmechanicalAVR 11/05/12 andCAD status post with SVG to LAD and SVG to circumflex, complicated by pericardial effusion without tamponade requiring subxiphoid window, subclavian artery stenosis status post bypass in 2001,NSTEMIin 2017 with ischemic cardiomyopathy with an EF of 40 to 45%, heart cath resulting in PCI  of the SVG to LAD, EF normalized in 2018.She also has a history of sarcoidosis dyslipidemia, and contrast allergy seen in the office on 06/07/18 with post cath groin concerns found to have pseudoaneurysm by ultrasound.   Assessment & Plan    1. Pseudoaneurysm, post-cath site - vascular surgery repair yesterday 06/09/18, apparently had blood clots in drain  - on heparin drip, vascular OK to restart coumadin tonight - pharmacy has restarted unless Hb continues to drop  2.Hematuria - pt is on triple therapy for recent stent and mechanical aortic valve - she denies hematuria while in hospital - plan was to stop ASA in 1 month - Hb is 8.7, from 9.9 - vascular ok to restart coumadin - I will recheck a hb this afternoon before warfarin  3. Mechanical aortic valve - heparin drip, 1 g drop in Hb overnight - INR 1.56  4. Chronic systolic and diastolic heart failure - euvolemic on exam  5. Epigastric and left upper abdominal pain - tender to palpation - will check amylase, lipase today - protonix, pepsid  6. PVCs and bigeminy - will check a Mg - she is generally asymptomatic, only reporting occasional palpitations  7. Abnormal EKG in office - EKG has normalized - she denies anginal pain   For questions or updates, please contact [facility] Please consult [email or web address] for contact info under        Signed, 08/07/18, PA  06/10/2018, 8:54 AM    I have examined the patient and reviewed assessment and plan and discussed with patient.  Agree with above as stated.  She reports some leaking from the left groin incision.  She has some pain as well.  SHe did not sleep well.  No chest pain.  Holding off on IV Coumadin given CBC result. OK to hold aspirin.  COntinue Plavix and heparin IV.    Marcelino Duster

## 2018-06-10 NOTE — Progress Notes (Addendum)
ANTICOAGULATION CONSULT NOTE   Pharmacy Consult for heparin + warfarin Indication: Mechanical AVR  Allergies  Allergen Reactions  . Contrast Media [Iodinated Diagnostic Agents] Hives, Itching and Swelling    Patient Measurements: Height: 5\' 7"  (170.2 cm) Weight: 159 lb 9.6 oz (72.4 kg) IBW/kg (Calculated) : 61.6 Heparin Dosing Weight: 70 kg  Vital Signs: Temp: 98.1 F (36.7 C) (09/09 0900) Temp Source: Oral (09/09 0900) BP: 114/101 (09/09 0900)  Labs: Recent Labs    06/07/18 1406 06/07/18 2041 06/08/18 0235 06/08/18 0834  06/09/18 0433 06/09/18 0440 06/09/18 0839 06/09/18 1553 06/10/18 0016 06/10/18 0742 06/10/18 1014  HGB 9.8* 9.1*  --  9.6*  --   --  9.6* 9.9*  --  8.7*  --  8.0*  HCT 31.4* 28.6*  --  30.2*  --   --  30.7* 29.0*  --  27.7*  --  24.6*  PLT 350 314  --  328  --   --  378  --   --  354  --  368  LABPROT 23.3*  --  23.6* 23.3*   < > 20.9*  --   --  18.8* 18.5*  --   --   INR 2.09  --  2.12 2.09   < > 1.82  --   --  1.59 1.56  --   --   HEPARINUNFRC  --   --   --   --   --   --   --   --   --  <0.10* 0.54  --   CREATININE 1.14*  --   --  1.00  --   --   --   --   --  0.97  --   --   TROPONINI  --  <0.03 <0.03 <0.03  --   --   --   --   --   --   --   --    < > = values in this interval not displayed.    Estimated Creatinine Clearance: 56.2 mL/min (by C-G formula based on SCr of 0.97 mg/dL).   Medical History: Past Medical History:  Diagnosis Date  . Ascending aortic aneurysm (HCC)    a.  redo replacement of fusiform aneurysm of ascending aorta (complicated by pericardial hematoma s/p evacuation) in 2014.  2015 Contrast media allergy   . Coronary artery disease    a. s/p CABG x2 in 2014. b. s/p DES to SVG-LAD in 2017.  . H/O mechanical aortic valve replacement   . Hyperlipidemia   . Hypertension   . Ischemic cardiomyopathy    a. EF 40-45% in 2017 at time of NSTEMI, improved to normal in 2018.  . Non-STEMI (non-ST elevated myocardial infarction)  (HCC)   . Premature atrial contractions   . PVC's (premature ventricular contractions)   . RA (rheumatoid arthritis) (HCC)   . S/P AVR (aortic valve replacement) 08/1999   [employer] AVR for aortic insuff, Goal INR 2.5-3.5  . 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 11/2012) - complicated by pericardial hematoma --> subxiphoid pericardial exposure and evacuation b. Cath 06/2016: DES to SVG-LAD ostium  . Sarcoid   . Second degree AV block    a. ? possibly seen in 12/2016 clinic visit - EKG not truly suggestive  . Severe anemia   . 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 s/p left subclavian bypass.  Medications:  Medications Prior to Admission  Medication Sig Dispense Refill Last Dose  . acetaminophen (TYLENOL) 500 MG tablet Take 500-1,000 mg by mouth every 6 (six) hours as needed (pain).   06/06/2018 at pm  . aspirin EC 81 MG EC tablet Take 1 tablet (81 mg total) by mouth daily. (Patient taking differently: Take 81 mg by mouth daily at 6 PM. )   06/06/2018 at pm  . atorvastatin (LIPITOR) 80 MG tablet TAKE 1 TABLET (80 MG TOTAL) BY MOUTH DAILY AT 6 PM. 90 tablet 2 06/06/2018 at pm  . Calcium Carb-Cholecalciferol (CALCIUM 600+D3 PO) Take 1 tablet by mouth daily.    06/06/2018 at am  . clopidogrel (PLAVIX) 75 MG tablet TAKE 1 TABLET BY MOUTH DAILY WITH BREAKFAST (Patient taking differently: Take 75 mg by mouth daily. ) 90 tablet 3 06/06/2018 at am  . Diclofenac Sodium 3 % GEL Place 1 application onto the skin 2 (two) times daily. To affected area. (Patient taking differently: Place 1 application onto the skin 2 (two) times daily as needed (arthritis pain). ) 100 g 0 2-3 weeks ago  . isosorbide mononitrate (IMDUR) 30 MG 24 hr tablet TAKE 1 TABLET BY MOUTH EVERY DAY (Patient taking differently: Take 30 mg by mouth daily. ) 30 tablet 6 06/06/2018 at am  . lisinopril (PRINIVIL,ZESTRIL) 2.5 MG tablet Take 1 tablet (2.5 mg  total) by mouth daily. 90 tablet 4 06/06/2018 at am  . Multiple Vitamin (MULTIVITAMIN WITH MINERALS) TABS tablet Take 1 tablet by mouth daily.   06/06/2018 at am  . nitroGLYCERIN (NITROSTAT) 0.4 MG SL tablet PLACE 1 TABLET (0.4 MG TOTAL) UNDER THE TONGUE EVERY 5 (FIVE) MINUTES AS NEEDED FOR CHEST PAIN. 25 tablet 6 2-3 weeks agop  . ondansetron (ZOFRAN) 4 MG tablet Take 1 tablet (4 mg total) by mouth every 6 (six) hours. (Patient taking differently: Take 2-4 mg by mouth daily. ) 12 tablet 0 06/06/2018 at am  . pantoprazole (PROTONIX) 40 MG tablet TAKE 1 TABLET BY MOUTH EVERY DAY (Patient taking differently: Take 40 mg by mouth daily. ) 30 tablet 6 06/06/2018 at am  . polyvinyl alcohol (ARTIFICIAL TEARS) 1.4 % ophthalmic solution Place 1-2 drops into both eyes daily as needed for dry eyes.   06/07/2018 at am  . warfarin (COUMADIN) 5 MG tablet TAKE 1/2 TO 1 TABLET BY MOUTH DAILY OR AS DIRECTED BY COUMADIN CLINIC (Patient taking differently: Take 2.5-5 mg by mouth See admin instructions. Take 1/2 tablet (2.5 mg) by mouth on Monday and Thursday at 6pm, take 1 tablet (5 mg) on Sunday, Tuesday, Wednesday, Friday, Saturday at 6pm) 90 tablet 0 06/06/2018 at 1800  . diazepam (VALIUM) 2 MG tablet Take 1 tablet (2 mg total) by mouth every 8 (eight) hours as needed for muscle spasms. (Patient not taking: Reported on 06/07/2018) 10 tablet 0 Not Taking at Unknown time  . enoxaparin (LOVENOX) 80 MG/0.8ML injection Inject 0.8 mLs (80 mg total) into the skin every 12 (twelve) hours for 7 doses. (Patient not taking: Reported on 06/07/2018) 7 Syringe 0 Not Taking at Unknown time  . fluticasone (FLONASE) 50 MCG/ACT nasal spray Place 1 spray into both nostrils daily. (Patient not taking: Reported on 06/07/2018) 16 g 2 Not Taking at Unknown time    Assessment:  10 YOF with a mechanical AVR on warfarin at home here with pseudoaneurysm near her PCI groin site now s/p repair with VVS 9/8. Per vascular, pt ok to resume warfarin tonight. Bleeding  noted at  site of JP drain which was removed this morning, Hgb low this morning, heparin level slightly above goal at 0.54.  Home warfarin dose = 2.5mg  Mon/Thurs; 5mg  all other bdays  Goal of Therapy:  Heparin level 0.3-0.5 units/ml Monitor platelets by anticoagulation protocol: Yes   Plan:  -Reduce IV heparin to 900 units/h -Recheck 6hr heparin level -Warfarin 5mg  PO x1 tonight -Daily INR, heparin level, CBC  ADDENDUM: Discussed with cardiology, given falling hemoglobin will hold warfarin tonight.  , PharmD, BCPS Clinical Pharmacist [phone] Please check AMION for all [facility] numbers 06/10/2018

## 2018-06-10 NOTE — Progress Notes (Signed)
Patient assessed again for bleeding at JP drain site. Dressing is clean and dry. Left leg is warm to touch. Patient denies numbness, tingling or pain. BP checked again - 98/47 (MAP 63). Pressure is trending upward. Will continue to monitor. Victorino December, RN

## 2018-06-10 NOTE — Progress Notes (Signed)
Removed pt foley; will continue to monitor.

## 2018-06-10 NOTE — Progress Notes (Signed)
Physician on call made aware of the fact that patient's left thigh drain site is still oozing. Will continue to monitor.Marland Kitchen

## 2018-06-10 NOTE — Progress Notes (Signed)
Received report from day RN that patient was bleeding from L thigh JP drain site. Drain was pulled this morning. Patient oozing and the dressing was 1/3 saturated. At 2000, rechecked patient dressing. Dressing was 80% saturated. I held pressure 2cm above site for 20 minutes. Blood clot covering incision but also still oozing after pressure held. Placed a pressure dressing with folded gauze, ABD and tape. Will continue to monitor. BP 94/43 (MAP60). Victorino December, RN

## 2018-06-10 NOTE — Progress Notes (Signed)
VASCULAR LAB PRELIMINARY  ARTERIAL  ABI completed:ABIs and waveforms are within normal limits.     RIGHT    LEFT    PRESSURE WAVEFORM  PRESSURE WAVEFORM  BRACHIAL 110 T BRACHIAL 100 T  DP 145 T DP 146 T  AT   AT    PT 141 T PT 148 T  PER   PER    GREAT TOE  NA GREAT TOE  NA    RIGHT LEFT  ABI 1.3 1.3     Anastasha Ortez, RVT 06/10/2018, 9:50 AM

## 2018-06-10 NOTE — Evaluation (Signed)
Occupational Therapy Evaluation Patient Details Name: [name] MRN: [id number] DOB: Nov 12, 1950 Today's Date: 06/10/2018    History of Present Illness 67 yo female s/p Repair of left femoral artery pseudoaneurysm. PHMx: CAD s/p CABG, HTN, non-STEMI, RA, severe anemia, AVR   Clinical Impression   This 67 yo female admitted and underwent above presents to acute OT with increased pain (left groin area and lower mid chest area), decreased mobility and balance all affecting her safety and independence with basic ADLs. Pta pt was totally independent with basic ADLs, IADLs, and working--now pt min A-setup level. She will benefit from acute OT without need for follow up.    Follow Up Recommendations  No OT follow up(A for IADLs)    Equipment Recommendations  None recommended by OT       Precautions / Restrictions Precautions Precautions: Fall Restrictions Weight Bearing Restrictions: No      Mobility Bed Mobility Overal bed mobility: Modified Independent             General bed mobility comments: HOB up and increased time due to pain  Transfers Overall transfer level: Needs assistance Equipment used: Rolling walker (2 wheeled) Transfers: Sit to/from Stand Sit to Stand: Min guard         General transfer comment: VCs for safe hand placement, close to not needing RW in room--but may need more for longer distances    Balance Overall balance assessment: Needs assistance Sitting-balance support: No upper extremity supported;Feet supported Sitting balance-Leahy Scale: Fair Sitting balance - Comments: pain is keeping from being at "good" level   Standing balance support: No upper extremity supported;During functional activity Standing balance-Leahy Scale: Fair Standing balance comment: standing at sink for grooming                           ADL either performed or assessed with clinical judgement   ADL Overall ADL's : Needs  assistance/impaired Eating/Feeding: Independent;Sitting   Grooming: Min guard;Standing;Wash/dry face;Wash/dry hands;Oral care   Upper Body Bathing: Set up;Sitting   Lower Body Bathing: Minimal assistance Lower Body Bathing Details (indicate cue type and reason): min guard A sit<>stand Upper Body Dressing : Set up;Sitting   Lower Body Dressing: Minimal assistance Lower Body Dressing Details (indicate cue type and reason): min guard A sit<>stand Toilet Transfer: Min guard;Ambulation;RW;Regular Toilet;Grab bars   Toileting- Clothing Manipulation and Hygiene: Min guard;Sit to/from stand               Vision Patient Visual Report: No change from baseline              Pertinent Vitals/Pain Pain Assessment: Faces Faces Pain Scale: Hurts whole lot Pain Location: left groin incision with sitting/hip flexion, lower chest with laying flat Pain Descriptors / Indicators: (leg--tighthness, shooting, grimacing; lower chest--pain, feel like going to emesis) Pain Intervention(s): Limited activity within patient's tolerance;Monitored during session;Repositioned;Patient requesting pain meds-RN notified(pt also requesting for reflux meds)     Hand Dominance Right   Extremity/Trunk Assessment Upper Extremity Assessment Upper Extremity Assessment: Overall WFL for tasks assessed           Communication Communication Communication: No difficulties   Cognition Arousal/Alertness: Awake/alert Behavior During Therapy: WFL for tasks assessed/performed Overall Cognitive Status: Within Functional Limits for tasks assessed  Home Living Family/patient expects to be discharged to:: Private residence Living Arrangements: Alone Available Help at Discharge: Family;Available PRN/intermittently Type of Home: Apartment Home Access: Stairs to enter Entrance Stairs-Number of Steps: 12 Entrance Stairs-Rails: Left(going up) Home  Layout: One level     Bathroom Shower/Tub: Tub/shower unit;Curtain   Bathroom Toilet: Standard(sink beside she can hold onto for sit<>stand)     Home Equipment: None          Prior Functioning/Environment Level of Independence: Independent        Comments: works Medical laboratory scientific officer Problem List: Decreased range of motion;Impaired balance (sitting and/or standing);Pain      OT Treatment/Interventions: Self-care/ADL training;Balance training;DME and/or AE instruction;Patient/family education    OT Goals(Current goals can be found in the care plan section) Acute Rehab OT Goals Patient Stated Goal: to not come back to hospital OT Goal Formulation: With patient Time For Goal Achievement: 06/24/18 Potential to Achieve Goals: Good  OT Frequency: Min 2X/week              AM-PAC PT "6 Clicks" Daily Activity     Outcome Measure Help from another person eating meals?: None Help from another person taking care of personal grooming?: A Little Help from another person toileting, which includes using toliet, bedpan, or urinal?: A Little Help from another person bathing (including washing, rinsing, drying)?: A Little Help from another person to put on and taking off regular upper body clothing?: A Little Help from another person to put on and taking off regular lower body clothing?: A Little 6 Click Score: 19   End of Session Equipment Utilized During Treatment: Rolling walker Nurse Communication: Mobility status;Patient requests pain meds(and request reflux meds)  Activity Tolerance: Patient tolerated treatment well(despite pain) Patient left: in bed;with call bell/phone within reach;with bed alarm set  OT Visit Diagnosis: Unsteadiness on feet (R26.81);Pain Pain - Right/Left: Left Pain - part of body: Leg(and lower chest)                Time: 2423-5361 OT Time Calculation (min): 26 min Charges:  OT General Charges $OT Visit: 1 Visit OT Evaluation $OT Eval  Moderate Complexity: 1 Mod OT Treatments $Self Care/Home Management : 8-22 mins  Ignacia Palma, OTR/L Acute [employer] Pager [phone] Office [phone]

## 2018-06-10 NOTE — Progress Notes (Signed)
Patient having small amount of bleeding from JP drain removal site.  Held pressure and dressing with ABD and gauze.  Will continue to monitor.

## 2018-06-10 NOTE — Progress Notes (Signed)
ANTICOAGULATION CONSULT NOTE - Follow Up Consult  Pharmacy Consult for heparin Indication: AVR  Labs: Recent Labs    06/07/18 1406 06/07/18 2041 06/08/18 0235 06/08/18 0834  06/09/18 0433 06/09/18 0440 06/09/18 0839 06/09/18 1553 06/10/18 0016  HGB 9.8* 9.1*  --  9.6*  --   --  9.6* 9.9*  --  8.7*  HCT 31.4* 28.6*  --  30.2*  --   --  30.7* 29.0*  --  27.7*  PLT 350 314  --  328  --   --  378  --   --  354  LABPROT 23.3*  --  23.6* 23.3*   < > 20.9*  --   --  18.8* 18.5*  INR 2.09  --  2.12 2.09   < > 1.82  --   --  1.59 1.56  HEPARINUNFRC  --   --   --   --   --   --   --   --   --  <0.10*  CREATININE 1.14*  --   --  1.00  --   --   --   --   --  0.97  TROPONINI  --  <0.03 <0.03 <0.03  --   --   --   --   --   --    < > = values in this interval not displayed.    Assessment/Plan:  67yo female undetectable on heparin though RN reports pt was bleeding at wound and had to stop heparin x1hr. Will continue gtt at current rate for and check additional level; pt recently was therapeutic at similar rates.   Vernard Gambles, PharmD, BCPS  06/10/2018,1:52 AM

## 2018-06-11 ENCOUNTER — Encounter (HOSPITAL_COMMUNITY): Payer: Self-pay | Admitting: Orthopedic Surgery

## 2018-06-11 ENCOUNTER — Inpatient Hospital Stay (HOSPITAL_COMMUNITY): Payer: Commercial Managed Care - PPO | Admitting: Anesthesiology

## 2018-06-11 ENCOUNTER — Encounter (HOSPITAL_COMMUNITY)
Admission: EM | Disposition: A | Discharge status: Home Health | Payer: Self-pay | Source: Ambulatory Visit | Attending: Cardiovascular Disease

## 2018-06-11 DIAGNOSIS — I9789 Other postprocedural complications and disorders of the circulatory system, not elsewhere classified: Secondary | ICD-10-CM

## 2018-06-11 DIAGNOSIS — Z7901 Long term (current) use of anticoagulants: Secondary | ICD-10-CM

## 2018-06-11 HISTORY — PX: GROIN DEBRIDEMENT: SHX5159

## 2018-06-11 LAB — CBC
HEMATOCRIT: 22.8 % — AB (ref 36.0–46.0)
Hemoglobin: 7.3 g/dL — ABNORMAL LOW (ref 12.0–15.0)
MCH: 26.6 pg (ref 26.0–34.0)
MCHC: 32 g/dL (ref 30.0–36.0)
MCV: 83.2 fL (ref 78.0–100.0)
Platelets: 363 10*3/uL (ref 150–400)
RBC: 2.74 MIL/uL — AB (ref 3.87–5.11)
RDW: 18 % — ABNORMAL HIGH (ref 11.5–15.5)
WBC: 11.4 10*3/uL — ABNORMAL HIGH (ref 4.0–10.5)

## 2018-06-11 LAB — POCT I-STAT 4, (NA,K, GLUC, HGB,HCT)
GLUCOSE: 99 mg/dL (ref 70–99)
HEMATOCRIT: 26 % — AB (ref 36.0–46.0)
HEMOGLOBIN: 8.8 g/dL — AB (ref 12.0–15.0)
POTASSIUM: 4.3 mmol/L (ref 3.5–5.1)
SODIUM: 141 mmol/L (ref 135–145)

## 2018-06-11 LAB — PROTIME-INR
INR: 1.54
Prothrombin Time: 18.4 seconds — ABNORMAL HIGH (ref 11.4–15.2)

## 2018-06-11 LAB — PREPARE RBC (CROSSMATCH)

## 2018-06-11 SURGERY — DEBRIDEMENT, INGUINAL REGION
Anesthesia: General | Site: Groin | Laterality: Left

## 2018-06-11 MED ORDER — ROCURONIUM BROMIDE 50 MG/5ML IV SOSY
PREFILLED_SYRINGE | INTRAVENOUS | Status: AC
  Filled 2018-06-11: qty 5

## 2018-06-11 MED ORDER — LACTATED RINGERS IV SOLN
INTRAVENOUS | Status: DC
  Administered 2018-06-11: 15:00:00 via INTRAVENOUS

## 2018-06-11 MED ORDER — ONDANSETRON HCL 4 MG/2ML IJ SOLN
INTRAMUSCULAR | Status: AC
  Filled 2018-06-11: qty 2

## 2018-06-11 MED ORDER — LIDOCAINE 2% (20 MG/ML) 5 ML SYRINGE
INTRAMUSCULAR | Status: DC | PRN
  Administered 2018-06-11: 80 mg via INTRAVENOUS
  Administered 2018-06-11: 100 mg via INTRAVENOUS

## 2018-06-11 MED ORDER — LIDOCAINE 2% (20 MG/ML) 5 ML SYRINGE
INTRAMUSCULAR | Status: AC
  Filled 2018-06-11: qty 5

## 2018-06-11 MED ORDER — 0.9 % SODIUM CHLORIDE (POUR BTL) OPTIME
TOPICAL | Status: DC | PRN
  Administered 2018-06-11: 1000 mL

## 2018-06-11 MED ORDER — PHENYLEPHRINE 40 MCG/ML (10ML) SYRINGE FOR IV PUSH (FOR BLOOD PRESSURE SUPPORT)
PREFILLED_SYRINGE | INTRAVENOUS | Status: AC
  Filled 2018-06-11: qty 10

## 2018-06-11 MED ORDER — ONDANSETRON HCL 4 MG/2ML IJ SOLN: 4.0000 mg | Freq: Once | INTRAMUSCULAR | Status: DC | PRN

## 2018-06-11 MED ORDER — PROPOFOL 10 MG/ML IV BOLUS
INTRAVENOUS | Status: DC | PRN
  Administered 2018-06-11: 120 mg via INTRAVENOUS

## 2018-06-11 MED ORDER — FENTANYL CITRATE (PF) 100 MCG/2ML IJ SOLN
INTRAMUSCULAR | Status: DC | PRN
  Administered 2018-06-11: 25 ug via INTRAVENOUS
  Administered 2018-06-11: 100 ug via INTRAVENOUS

## 2018-06-11 MED ORDER — SODIUM CHLORIDE 0.9% IV SOLUTION: Freq: Once | INTRAVENOUS | Status: DC

## 2018-06-11 MED ORDER — SUGAMMADEX SODIUM 200 MG/2ML IV SOLN
INTRAVENOUS | Status: DC | PRN
  Administered 2018-06-11: 200 mg via INTRAVENOUS

## 2018-06-11 MED ORDER — ONDANSETRON HCL 4 MG/2ML IJ SOLN
INTRAMUSCULAR | Status: DC | PRN
  Administered 2018-06-11: 4 mg via INTRAVENOUS

## 2018-06-11 MED ORDER — SODIUM CHLORIDE 0.9 % IV SOLN
INTRAVENOUS | Status: DC | PRN
  Administered 2018-06-11: 50 ug/min via INTRAVENOUS

## 2018-06-11 MED ORDER — PROTAMINE SULFATE 10 MG/ML IV SOLN
INTRAVENOUS | Status: AC
  Filled 2018-06-11: qty 10

## 2018-06-11 MED ORDER — HEPARIN (PORCINE) IN NACL 100-0.45 UNIT/ML-% IJ SOLN: 750.0000 [IU]/h | INTRAMUSCULAR | Status: DC

## 2018-06-11 MED ORDER — SODIUM CHLORIDE 0.9 % IV SOLN
1.5000 g | INTRAVENOUS | Status: AC
  Administered 2018-06-11: 1.5 g via INTRAVENOUS
  Filled 2018-06-11: qty 1.5

## 2018-06-11 MED ORDER — THROMBIN 20000 UNITS EX KIT
PACK | CUTANEOUS | Status: AC
  Filled 2018-06-11: qty 1

## 2018-06-11 MED ORDER — SUCCINYLCHOLINE CHLORIDE 200 MG/10ML IV SOSY
PREFILLED_SYRINGE | INTRAVENOUS | Status: AC
  Filled 2018-06-11: qty 10

## 2018-06-11 MED ORDER — SUCCINYLCHOLINE CHLORIDE 200 MG/10ML IV SOSY
PREFILLED_SYRINGE | INTRAVENOUS | Status: DC | PRN
  Administered 2018-06-11: 80 mg via INTRAVENOUS

## 2018-06-11 MED ORDER — MIDAZOLAM HCL 2 MG/2ML IJ SOLN
INTRAMUSCULAR | Status: AC
  Filled 2018-06-11: qty 2

## 2018-06-11 MED ORDER — NEOSTIGMINE METHYLSULFATE 3 MG/3ML IV SOSY
PREFILLED_SYRINGE | INTRAVENOUS | Status: AC
  Filled 2018-06-11: qty 3

## 2018-06-11 MED ORDER — FENTANYL CITRATE (PF) 100 MCG/2ML IJ SOLN: 25.0000 ug | INTRAMUSCULAR | Status: DC | PRN

## 2018-06-11 MED ORDER — LABETALOL HCL 5 MG/ML IV SOLN
INTRAVENOUS | Status: AC
  Filled 2018-06-11: qty 4

## 2018-06-11 MED ORDER — HEPARIN SODIUM (PORCINE) 1000 UNIT/ML IJ SOLN
INTRAMUSCULAR | Status: AC
  Filled 2018-06-11: qty 2

## 2018-06-11 MED ORDER — SODIUM CHLORIDE 0.9 % IV SOLN
INTRAVENOUS | Status: AC
  Filled 2018-06-11: qty 1.2

## 2018-06-11 MED ORDER — HEPARIN (PORCINE) IN NACL 100-0.45 UNIT/ML-% IJ SOLN
750.0000 [IU]/h | INTRAMUSCULAR | Status: DC
  Administered 2018-06-11: 750 [IU]/h via INTRAVENOUS

## 2018-06-11 MED ORDER — ROCURONIUM BROMIDE 10 MG/ML (PF) SYRINGE
PREFILLED_SYRINGE | INTRAVENOUS | Status: DC | PRN
  Administered 2018-06-11: 50 mg via INTRAVENOUS

## 2018-06-11 MED ORDER — PHENYLEPHRINE HCL 10 MG/ML IJ SOLN
INTRAMUSCULAR | Status: DC | PRN
  Administered 2018-06-11 (×2): 80 ug via INTRAVENOUS
  Administered 2018-06-11: 120 ug via INTRAVENOUS

## 2018-06-11 MED ORDER — GLYCOPYRROLATE PF 0.2 MG/ML IJ SOSY
PREFILLED_SYRINGE | INTRAMUSCULAR | Status: AC
  Filled 2018-06-11: qty 1

## 2018-06-11 MED ORDER — HEPARIN (PORCINE) IN NACL 100-0.45 UNIT/ML-% IJ SOLN
950.0000 [IU]/h | INTRAMUSCULAR | Status: DC
  Administered 2018-06-12: 750 [IU]/h via INTRAVENOUS
  Administered 2018-06-13 – 2018-06-14 (×2): 850 [IU]/h via INTRAVENOUS
  Administered 2018-06-15 – 2018-06-17 (×3): 950 [IU]/h via INTRAVENOUS
  Filled 2018-06-11 (×6): qty 250

## 2018-06-11 MED ORDER — PROPOFOL 10 MG/ML IV BOLUS
INTRAVENOUS | Status: AC
  Filled 2018-06-11: qty 20

## 2018-06-11 MED ORDER — FENTANYL CITRATE (PF) 250 MCG/5ML IJ SOLN
INTRAMUSCULAR | Status: AC
  Filled 2018-06-11: qty 5

## 2018-06-11 SURGICAL SUPPLY — 36 items
ADH SKN CLS APL DERMABOND .7 (GAUZE/BANDAGES/DRESSINGS) ×1
BNDG GAUZE ELAST 4 BULKY (GAUZE/BANDAGES/DRESSINGS) IMPLANT
CANISTER SUCT 3000ML PPV (MISCELLANEOUS) ×2 IMPLANT
COVER SURGICAL LIGHT HANDLE (MISCELLANEOUS) ×1 IMPLANT
DERMABOND ADVANCED (GAUZE/BANDAGES/DRESSINGS) ×1
DERMABOND ADVANCED .7 DNX12 (GAUZE/BANDAGES/DRESSINGS) IMPLANT
DRAIN CHANNEL 19F RND (DRAIN) ×1 IMPLANT
ELECT CAUTERY BLADE 6.4 (BLADE) ×1 IMPLANT
ELECT REM PT RETURN 9FT ADLT (ELECTROSURGICAL) ×2
ELECTRODE REM PT RTRN 9FT ADLT (ELECTROSURGICAL) ×1 IMPLANT
EVACUATOR SILICONE 100CC (DRAIN) ×1 IMPLANT
GAUZE SPONGE 4X4 12PLY STRL (GAUZE/BANDAGES/DRESSINGS) ×1 IMPLANT
GAUZE SPONGE 4X4 12PLY STRL LF (GAUZE/BANDAGES/DRESSINGS) ×1 IMPLANT
GLOVE BIO SURGEON STRL SZ7.5 (GLOVE) ×2 IMPLANT
GLOVE BIOGEL PI IND STRL 8 (GLOVE) ×1 IMPLANT
GLOVE BIOGEL PI INDICATOR 8 (GLOVE) ×1
GOWN STRL REUS W/ TWL LRG LVL3 (GOWN DISPOSABLE) ×3 IMPLANT
GOWN STRL REUS W/TWL LRG LVL3 (GOWN DISPOSABLE) ×6
KIT BASIN OR (CUSTOM PROCEDURE TRAY) ×2 IMPLANT
KIT TURNOVER KIT B (KITS) ×2 IMPLANT
NS IRRIG 1000ML POUR BTL (IV SOLUTION) ×3 IMPLANT
PACK GENERAL/GYN (CUSTOM PROCEDURE TRAY) ×2 IMPLANT
PACK UNIVERSAL I (CUSTOM PROCEDURE TRAY) ×2 IMPLANT
PAD ARMBOARD 7.5X6 YLW CONV (MISCELLANEOUS) ×4 IMPLANT
STAPLER VISISTAT (STAPLE) IMPLANT
SUT ETHILON 3 0 PS 1 (SUTURE) ×1 IMPLANT
SUT PROLENE 5 0 C 1 24 (SUTURE) ×1 IMPLANT
SUT SILK 2 0 (SUTURE) ×2
SUT SILK 2-0 18XBRD TIE 12 (SUTURE) IMPLANT
SUT VIC AB 2-0 CTB1 (SUTURE) IMPLANT
SUT VIC AB 3-0 SH 27 (SUTURE)
SUT VIC AB 3-0 SH 27X BRD (SUTURE) IMPLANT
SUT VICRYL 4-0 PS2 18IN ABS (SUTURE) IMPLANT
TAPE CLOTH SURG 4X10 WHT LF (GAUZE/BANDAGES/DRESSINGS) ×1 IMPLANT
TOWEL GREEN STERILE (TOWEL DISPOSABLE) ×2 IMPLANT
WATER STERILE IRR 1000ML POUR (IV SOLUTION) ×2 IMPLANT

## 2018-06-11 NOTE — Progress Notes (Signed)
Patient still oozing from JP drain site. Contacted pharmacy and will hold heparin gtt for 1 hour and reassess. Will continue to monitor. Victorino December, RN

## 2018-06-11 NOTE — H&P (View-Only) (Signed)
[facility]  Subjective  - Doing OK over all, worried about the bleeding from the drain sites.      Objective (!) 90/50 66 98 F (36.7 C) (Oral) (!) 23 100%  Intake/Output Summary (Last 24 hours) at 06/11/2018 0742 Last data filed at 06/11/2018 0609 Gross per 24 hour  Intake 2687.17 ml  Output 600 ml  Net 2087.17 ml   Left groin firm, skin intact Bleeding at drain exit site, hemostatic "snow" placed with pressure held, then pressure dressing applied. BP hypotensive 90/50, symptomatic with light headedness.  HGB 7.3    Assessment/Planning: POD # 2 Repair of left femoral artery pseudoaneurysm.  Will transfuse 2 units of PRBC for surgical blood loss anemia, chronic anemia starting at 9.1 pre-op.  Check H/H post transfusion. Heparin was held for 1 hour last night and rate decreased 100 units/ml Coumadin on hold. INR 1.54 Will re check drain site later today.  Mosetta Pigeon 06/11/2018 7:42 AM --  Laboratory Lab Results: Recent Labs    06/10/18 1014 06/11/18 0505  WBC 13.5* 11.4*  HGB 8.0* 7.3*  HCT 24.6* 22.8*  PLT 368 363   BMET Recent Labs    06/08/18 0834 06/09/18 0839 06/10/18 0016  NA 138 139 140  K 4.0 3.9 4.5  CL 105  --  108  CO2 24  --  23  GLUCOSE 131*  --  139*  BUN 11  --  10  CREATININE 1.00  --  0.97  CALCIUM 8.8*  --  8.8*    COAG Lab Results  Component Value Date   INR 1.54 06/11/2018   INR 1.56 06/10/2018   INR 1.59 06/09/2018   No results found for: PTT

## 2018-06-11 NOTE — Progress Notes (Addendum)
PT Cancellation Note  Patient Details Name: [name] MRN: [id number] DOB: Jul 20, 1951   Cancelled Treatment:    Reason Eval/Treat Not Completed: Medical issues which prohibited therapy. Continues to have bleeding from groin.    Angelina Ok Maycok 06/11/2018, 11:14 AM Skip Mayer PT Acute Rehabilitation Services Pager [phone] Office [phone]

## 2018-06-11 NOTE — Anesthesia Procedure Notes (Signed)
Procedure Name: Intubation Date/Time: 06/11/2018 3:36 PM Performed by: Trevor Iha, MD Pre-anesthesia Checklist: Patient identified, Emergency Drugs available, Suction available and Patient being monitored Patient Re-evaluated:Patient Re-evaluated prior to induction Oxygen Delivery Method: Circle system utilized Preoxygenation: Pre-oxygenation with 100% oxygen Induction Type: IV induction, Cricoid Pressure applied and Rapid sequence Ventilation: Mask ventilation without difficulty Laryngoscope Size: Miller and 1 Grade View: Grade I Tube type: Oral Tube size: 7.0 mm Number of attempts: 1 Airway Equipment and Method: Stylet Placement Confirmation: ETT inserted through vocal cords under direct vision,  positive ETCO2,  CO2 detector and breath sounds checked- equal and bilateral Secured at: 22 cm Tube secured with: Tape Dental Injury: Teeth and Oropharynx as per pre-operative assessment

## 2018-06-11 NOTE — Progress Notes (Signed)
Patient c/o some lightheadedness. She believes it is d/t the fact that she hasn't slept for most of the night. A second manual BP was 90/50. Automatic BP at 6:00 was 92/47 (MAP 61). JP drain site still oozing. Will continue to monitor. Victorino December, RN

## 2018-06-11 NOTE — Progress Notes (Signed)
[facility]  Subjective  - Doing OK over all, worried about the bleeding from the drain sites.      Objective (!) 90/50 66 98 F (36.7 C) (Oral) (!) 23 100%  Intake/Output Summary (Last 24 hours) at 06/11/2018 0742 Last data filed at 06/11/2018 0609 Gross per 24 hour  Intake 2687.17 ml  Output 600 ml  Net 2087.17 ml   Left groin firm, skin intact Bleeding at drain exit site, hemostatic "snow" placed with pressure held, then pressure dressing applied. BP hypotensive 90/50, symptomatic with light headedness.  HGB 7.3    Assessment/Planning: POD # 2 Repair of left femoral artery pseudoaneurysm.  Will transfuse 2 units of PRBC for surgical blood loss anemia, chronic anemia starting at 9.1 pre-op.  Check H/H post transfusion. Heparin was held for 1 hour last night and rate decreased 100 units/ml Coumadin on hold. INR 1.54 Will re check drain site later today.  [name] 06/11/2018 7:42 AM --  Laboratory Lab Results: Recent Labs    06/10/18 1014 06/11/18 0505  WBC 13.5* 11.4*  HGB 8.0* 7.3*  HCT 24.6* 22.8*  PLT 368 363   BMET Recent Labs    06/08/18 0834 06/09/18 0839 06/10/18 0016  NA 138 139 140  K 4.0 3.9 4.5  CL 105  --  108  CO2 24  --  23  GLUCOSE 131*  --  139*  BUN 11  --  10  CREATININE 1.00  --  0.97  CALCIUM 8.8*  --  8.8*    COAG Lab Results  Component Value Date   INR 1.54 06/11/2018   INR 1.56 06/10/2018   INR 1.59 06/09/2018   No results found for: PTT    

## 2018-06-11 NOTE — Op Note (Signed)
    NAME: [name]    MRN: [id number] DOB: [birth year]    DATE OF OPERATION: 06/11/2018  PREOP DIAGNOSIS:    Persistent bleeding from left groin incision  POSTOP DIAGNOSIS:    Same  PROCEDURE:    Exploration of left groin wound and placement of 19 Blake drain  SURGEON: Di Kindle. Edilia Bo, MD, FACS  ASSIST: None  ANESTHESIA: General  EBL: 50 cc  INDICATIONS:    [name] is a 67 y.o. female who underwent repair of a left common femoral artery pseudoaneurysm 2 days ago.  She is on heparin and Coumadin and has had persistent bleeding from the left groin despite holding her heparin.  I felt that exploration was indicated to washout the hematoma and be sure there was no active bleeding.  FINDINGS:   No specific bleeding site identified.  Hematoma was washed out and a Blake drain placed.  There was some generalized oozing which was cauterized.  TECHNIQUE:   The patient was taken to the operating room and received a general anesthetic.  The left groin was prepped and draped in usual sterile fashion.  The previous incision in the left groin was opened in the sutures were all taken out exposing the repair of the common femoral artery.  There was some generalized oozing.  There is one area on the artery which I repaired with a 5-0 Prolene suture.  Large amount of hematoma was evacuated.  There was some generalized oozing which was cauterized.  The wound was irrigated with copious amounts of saline.  Hemostasis was obtained and I placed a new 19 Blake drain.  This was secured with a 3-0 nylon.  The exit site for the old drain was closed with 3-0 nylon.  The skin was then closed with a deep layer of 2-0 Vicryl, a subcutaneous layer of 3-0 Vicryl, and the skin closed with 4-0 Vicryl.  Dermabond was applied.  The patient tolerated the procedure well and was transferred to the recovery room in stable condition.  All needle and sponge counts were correct.  [name], MD,  FACS [facility]  DATE OF DICTATION:   06/11/2018

## 2018-06-11 NOTE — Progress Notes (Signed)
Pressure held at [facility] drain site for 15 minutes. Patient denies any changes in status such as lightheadedness, tiredness, etc. Endorses pain at left groin incision site. Patient has order for PRN 500cc NS bolus for SBP<100. Will check blood pressure when bolus is done. Will continue to monitor. Victorino December, RN

## 2018-06-11 NOTE — Progress Notes (Signed)
Occupational Therapy Treatment Patient Details Name: [name] MRN: [id number] DOB: [birth year] Today's Date: 06/11/2018    History of present illness 67 yo female s/p Repair of left femoral artery pseudoaneurysm. PHMx: CAD s/p CABG, HTN, non-STEMI, RA, severe anemia, AVR   OT comments  Pt with bleeding/drainage from post catheter site and receiving blood transfusion. Limited session to education in use of AE for LB ADL and bed level grooming. Will continue to follow.  Follow Up Recommendations  No OT follow up    Equipment Recommendations  None recommended by OT    Recommendations for Other Services      Precautions / Restrictions Precautions Precautions: Fall Restrictions Weight Bearing Restrictions: No       Mobility Bed Mobility                  Transfers                      Balance                                           ADL either performed or assessed with clinical judgement   ADL Overall ADL's : Needs assistance/impaired     Grooming: Wash/dry face;Oral care;Wash/dry hands;Bed level;Set up         Lower Body Bathing Details (indicate cue type and reason): educated in use of long handled bath sponge       Lower Body Dressing Details (indicate cue type and reason): educated in use of reacher, sock aide, long shoe horn               General ADL Comments: Pt with oozing groin incision, limited activity.     Vision       Perception     Praxis      Cognition Arousal/Alertness: Awake/alert Behavior During Therapy: WFL for tasks assessed/performed Overall Cognitive Status: Within Functional Limits for tasks assessed                                 General Comments: pt fatigued after minimal sleep last night, receiving blood        Exercises     Shoulder Instructions       General Comments      Pertinent Vitals/ Pain       Pain Assessment: Faces Faces Pain Scale: Hurts a  little bit Pain Location: L groin Pain Descriptors / Indicators: Sore Pain Intervention(s): Monitored during session  Home Living                                          Prior Functioning/Environment              Frequency  Min 2X/week        Progress Toward Goals  OT Goals(current goals can now be found in the care plan section)  Progress towards OT goals: Not progressing toward goals - comment(limited session due to groin drainage)  Acute Rehab OT Goals Patient Stated Goal: to not come back to hospital OT Goal Formulation: With patient Time For Goal Achievement: 06/24/18 Potential to Achieve Goals: Good  Plan Discharge plan remains appropriate    Co-evaluation  AM-PAC PT "6 Clicks" Daily Activity     Outcome Measure   Help from another person eating meals?: None Help from another person taking care of personal grooming?: A Little Help from another person toileting, which includes using toliet, bedpan, or urinal?: A Little Help from another person bathing (including washing, rinsing, drying)?: A Little Help from another person to put on and taking off regular upper body clothing?: None Help from another person to put on and taking off regular lower body clothing?: A Little 6 Click Score: 20    End of Session    OT Visit Diagnosis: Pain   Activity Tolerance Treatment limited secondary to medical complications (Comment)(bleeding incision)   Patient Left in bed;with call bell/phone within reach   Nurse Communication (PA--limit activity today)        Time: 0946-1000 OT Time Calculation (min): 14 min  Charges: OT General Charges $OT Visit: 1 Visit OT Treatments $Self Care/Home Management : 8-22 mins  06/11/2018 Martie Round, OTR/L Pager: [phone] [place] Planas, [place] Bailiff 06/11/2018, 10:10 AM

## 2018-06-11 NOTE — Transfer of Care (Signed)
Immediate Anesthesia Transfer of Care Note  Patient: [name]  Procedure(s) Performed: EXPLORATION LEFT GROIN FOR BLEEDING (Left Groin)  Patient Location: PACU  Anesthesia Type:General  Level of Consciousness: awake, alert  and oriented  Airway & Oxygen Therapy: Patient Spontanous Breathing and Patient connected to nasal cannula oxygen  Post-op Assessment: Report given to RN, Post -op Vital signs reviewed and stable and Patient moving all extremities  Post vital signs: Reviewed and stable  Last Vitals:  Vitals Value Taken Time  BP 144/57 06/11/2018  4:47 PM  Temp    Pulse 76 06/11/2018  4:47 PM  Resp 18 06/11/2018  4:47 PM  SpO2 100 % 06/11/2018  4:47 PM  Vitals shown include unvalidated device data.  Last Pain:  Vitals:   06/11/18 1222  TempSrc: Oral  PainSc:       Patients Stated Pain Goal: 0 (06/08/18 2150)  Complications: No apparent anesthesia complications

## 2018-06-11 NOTE — Progress Notes (Signed)
ANTICOAGULATION CONSULT NOTE - Follow Up Consult  Pharmacy Consult for heparin Indication: AVR  Labs: Recent Labs    06/08/18 0235  06/08/18 0834  06/09/18 0433 06/09/18 0440 06/09/18 0839 06/09/18 1553 06/10/18 0016 06/10/18 0742 06/10/18 1014 06/10/18 1807  HGB  --    < > 9.6*  --   --  9.6* 9.9*  --  8.7*  --  8.0*  --   HCT  --    < > 30.2*  --   --  30.7* 29.0*  --  27.7*  --  24.6*  --   PLT  --    < > 328  --   --  378  --   --  354  --  368  --   LABPROT 23.6*  --  23.3*   < > 20.9*  --   --  18.8* 18.5*  --   --   --   INR 2.12  --  2.09   < > 1.82  --   --  1.59 1.56  --   --   --   HEPARINUNFRC  --   --   --   --   --   --   --   --  <0.10* 0.54  --  0.59  CREATININE  --   --  1.00  --   --   --   --   --  0.97  --   --   --   TROPONINI <0.03  --  <0.03  --   --   --   --   --   --   --   --   --    < > = values in this interval not displayed.    Assessment/Plan:  RN reports that pt is still oozing from left thigh JP drain site w/ enough bloody drainage to saturate dressing; RN also reports soft BP.  Heparin level has not yet been drawn for current heparin rate.  Will hold heparin gtt for 1hr to attempt to control oozing then resume gtt at current rate.  Vernard Gambles, PharmD, BCPS  06/11/2018,1:37 AM

## 2018-06-11 NOTE — Progress Notes (Signed)
ANTICOAGULATION CONSULT NOTE   Pharmacy Consult for heparin Indication: Mechanical AVR  Allergies  Allergen Reactions  . Contrast Media [Iodinated Diagnostic Agents] Hives, Itching and Swelling    Patient Measurements: Height: 5' 7.01" (170.2 cm) Weight: 162 lb 0.1 oz (73.5 kg) IBW/kg (Calculated) : 61.62 Heparin Dosing Weight: 70 kg  Vital Signs: Temp: 97.3 F (36.3 C) (09/10 1738) Temp Source: Oral (09/10 1222) BP: 129/57 (09/10 1732) Pulse Rate: 69 (09/10 1732)  Labs: Recent Labs    06/09/18 1553 06/10/18 0016 06/10/18 0742 06/10/18 1014 06/10/18 1807 06/11/18 0505 06/11/18 1548  HGB  --  8.7*  --  8.0*  --  7.3* 8.8*  HCT  --  27.7*  --  24.6*  --  22.8* 26.0*  PLT  --  354  --  368  --  363  --   LABPROT 18.8* 18.5*  --   --   --  18.4*  --   INR 1.59 1.56  --   --   --  1.54  --   HEPARINUNFRC  --  <0.10* 0.54  --  0.59  --   --   CREATININE  --  0.97  --   --   --   --   --     Estimated Creatinine Clearance: 56.2 mL/min (by C-G formula based on SCr of 0.97 mg/dL).   Medical History: Past Medical History:  Diagnosis Date  . Ascending aortic aneurysm (HCC)    a.  redo replacement of fusiform aneurysm of ascending aorta (complicated by pericardial hematoma s/p evacuation) in 2014.  Marland Kitchen Contrast media allergy   . Coronary artery disease    a. s/p CABG x2 in 2014. b. s/p DES to SVG-LAD in 2017.  . H/O mechanical aortic valve replacement   . Hyperlipidemia   . Hypertension   . Ischemic cardiomyopathy    a. EF 40-45% in 2017 at time of NSTEMI, improved to normal in 2018.  . Non-STEMI (non-ST elevated myocardial infarction) (HCC)   . Premature atrial contractions   . PVC's (premature ventricular contractions)   . RA (rheumatoid arthritis) (HCC)   . S/P AVR (aortic valve replacement) 08/1999   [employer] AVR for aortic insuff, Goal INR 2.5-3.5  . 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 SVG to LAD, SVG to Cfx; re-do replacement of fusiform aneursym of ascending thoracic  aorta (Dr. Donata Clay) - complicated by pericardial hematoma --> subxiphoid pericardial exposure and evacuation b. Cath 06/2016: DES to SVG-LAD ostium  . Sarcoid   . Second degree AV block    a. ? possibly seen in 12/2016 clinic visit - EKG not truly suggestive  . Severe anemia   . 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 s/p left subclavian bypass.    Medications:  Medications Prior to Admission  Medication Sig Dispense Refill Last Dose  . acetaminophen (TYLENOL) 500 MG tablet Take 500-1,000 mg by mouth every 6 (six) hours as needed (pain).   06/06/2018 at pm  . aspirin EC 81 MG EC tablet Take 1 tablet (81 mg total) by mouth daily. (Patient taking differently: Take 81 mg by mouth daily at 6 PM. )   06/06/2018 at pm  . atorvastatin (LIPITOR) 80 MG tablet TAKE 1 TABLET (80 MG TOTAL) BY MOUTH DAILY AT 6 PM. 90 tablet 2 06/06/2018 at pm  . Calcium Carb-Cholecalciferol (CALCIUM 600+D3 PO) Take 1 tablet by mouth daily.    06/06/2018 at am  . clopidogrel (PLAVIX) 75  MG tablet TAKE 1 TABLET BY MOUTH DAILY WITH BREAKFAST (Patient taking differently: Take 75 mg by mouth daily. ) 90 tablet 3 06/06/2018 at am  . Diclofenac Sodium 3 % GEL Place 1 application onto the skin 2 (two) times daily. To affected area. (Patient taking differently: Place 1 application onto the skin 2 (two) times daily as needed (arthritis pain). ) 100 g 0 2-3 weeks ago  . isosorbide mononitrate (IMDUR) 30 MG 24 hr tablet TAKE 1 TABLET BY MOUTH EVERY DAY (Patient taking differently: Take 30 mg by mouth daily. ) 30 tablet 6 06/06/2018 at am  . lisinopril (PRINIVIL,ZESTRIL) 2.5 MG tablet Take 1 tablet (2.5 mg total) by mouth daily. 90 tablet 4 06/06/2018 at am  . Multiple Vitamin (MULTIVITAMIN WITH MINERALS) TABS tablet Take 1 tablet by mouth daily.   06/06/2018 at am  . nitroGLYCERIN (NITROSTAT) 0.4 MG SL tablet PLACE 1 TABLET (0.4 MG TOTAL) UNDER THE TONGUE EVERY 5 (FIVE) MINUTES AS NEEDED FOR CHEST PAIN. 25 tablet 6 2-3 weeks agop  . ondansetron  (ZOFRAN) 4 MG tablet Take 1 tablet (4 mg total) by mouth every 6 (six) hours. (Patient taking differently: Take 2-4 mg by mouth daily. ) 12 tablet 0 06/06/2018 at am  . pantoprazole (PROTONIX) 40 MG tablet TAKE 1 TABLET BY MOUTH EVERY DAY (Patient taking differently: Take 40 mg by mouth daily. ) 30 tablet 6 06/06/2018 at am  . polyvinyl alcohol (ARTIFICIAL TEARS) 1.4 % ophthalmic solution Place 1-2 drops into both eyes daily as needed for dry eyes.   06/07/2018 at am  . warfarin (COUMADIN) 5 MG tablet TAKE 1/2 TO 1 TABLET BY MOUTH DAILY OR AS DIRECTED BY COUMADIN CLINIC (Patient taking differently: Take 2.5-5 mg by mouth See admin instructions. Take 1/2 tablet (2.5 mg) by mouth on Monday and Thursday at 6pm, take 1 tablet (5 mg) on Sunday, Tuesday, Wednesday, Friday, Saturday at 6pm) 90 tablet 0 06/06/2018 at 1800  . diazepam (VALIUM) 2 MG tablet Take 1 tablet (2 mg total) by mouth every 8 (eight) hours as needed for muscle spasms. (Patient not taking: Reported on 06/07/2018) 10 tablet 0 Not Taking at Unknown time  . enoxaparin (LOVENOX) 80 MG/0.8ML injection Inject 0.8 mLs (80 mg total) into the skin every 12 (twelve) hours for 7 doses. (Patient not taking: Reported on 06/07/2018) 7 Syringe 0 Not Taking at Unknown time  . fluticasone (FLONASE) 50 MCG/ACT nasal spray Place 1 spray into both nostrils daily. (Patient not taking: Reported on 06/07/2018) 16 g 2 Not Taking at Unknown time    Assessment:  67 YOF with a mechanical AVR on warfarin at home here with pseudoaneurysm near her PCI groin site now s/p repair with VVS 9/8.   Patient continued to have bleeding from JP drain and Hgb trended to 7.3 this morning. Now up to 8.8 after 2 units PRBCs. Underwent exploration of L groin wound and placement of Blake drain this afternoon. Aspirin has been held, Plavix needed for recent cardiac stent, holding off on resuming warfarin.  Okay per surgery to restart heparin on 9/11 at 0400 without bolus. Will resume at previous  rate.   Home warfarin dose = 2.5mg  Mon/Thurs; 5mg  all other bdays  Goal of Therapy:  Heparin level 0.3-0.5 units/ml Monitor platelets by anticoagulation protocol: Yes   Plan:  -Resume IV heparin 750 units/h with no bolus at 0400 on 9/11 -Recheck 6hr heparin level after restart -Monitor S/Sx bleeding closely   11/11, PharmD Clinical Pharmacist  Pager: [phone] Phone: [phone] Please check AMION for all [facility] numbers 06/11/2018

## 2018-06-11 NOTE — Progress Notes (Signed)
Patient dressing changed again on left thigh JP drain site. Applied pressure 2 cm above site for 10 minutes. Site still oozing. Applied a pressure dressing. Will continue to monitor. Patient BP 99/47 (Map 62). Victorino December, RN

## 2018-06-11 NOTE — Anesthesia Preprocedure Evaluation (Addendum)
Anesthesia Evaluation  Patient identified by MRN, date of birth, ID band Patient awake    Reviewed: Allergy & Precautions, NPO status , Patient's Chart, lab work & pertinent test results  Airway Mallampati: II  TM Distance: >3 FB Neck ROM: Full    Dental  (+) Poor Dentition, Chipped,    Pulmonary neg pulmonary ROS,    Pulmonary exam normal breath sounds clear to auscultation       Cardiovascular hypertension, Pt. on medications + angina with exertion + CAD, + Past MI, + Cardiac Stents, + CABG, + Peripheral Vascular Disease, + DOE and + DVT  Normal cardiovascular exam Rhythm:Regular Rate:Normal  Subclavian artery stenosis  ECG: SR, rate 63  ECHO: Left ventricle: The cavity size was normal. Wall thickness was normal. Systolic function was normal. The estimated ejection fraction was in the range of 60% to 65%. Wall motion was normal; there were no regional wall motion abnormalities. Doppler parameters are consistent with abnormal left ventricular relaxation (grade 1 diastolic dysfunction). Aortic valve: A mechanical prosthesis was present. Transvalvular gradient is higher than expected. Consider further evaluation with fluoroscopy or TEE to evaluate metalic leaflet excursion. Peak velocity (S): 413 cm/s. 280cm/s prior ECHO. Valve area (VTI): 0.98 cm^2. Valve area (Vmax): 0.9 cm^2. Valve area (Vmean): 0.91 cm^2. Mitral valve: There was mild regurgitation. Left atrium: The atrium was mildly dilated.  Ost LM lesion is 95% stenosed. A drug-eluting stent was successfully placed using a STENT RESOLUTE ONYX 5.0X12. Post intervention, there is a 0% residual stenosis. IABP placed preprocedure for left main intervention. Prox LAD lesion is 10% stenosed.     Recommend to resume Warfarin, at currently prescribed dose and frequency, on 05/21/18.  Recommend concurrent antiplatelet therapy of Aspirin 81mg  daily for 1 month and Clopidogrel 75mg   daily for indefinitely, as long as there are no bleeding problems.   Sees cardiologist [facility])  S/p AVR   Neuro/Psych negative neurological ROS  negative psych ROS   GI/Hepatic Neg liver ROS, GERD  Medicated and Controlled,  Endo/Other  negative endocrine ROS  Renal/GU negative Renal ROS     Musculoskeletal  (+) Arthritis ,   Abdominal   Peds  Hematology  (+) anemia , HLD   Anesthesia Other Findings BLEEDING  Reproductive/Obstetrics                           Anesthesia Physical Anesthesia Plan  ASA: IV  Anesthesia Plan: General   Post-op Pain Management:    Induction: Intravenous  PONV Risk Score and Plan: 3 and Ondansetron, Dexamethasone, Midazolam and Treatment may vary due to age or medical condition  Airway Management Planned: Oral ETT  Additional Equipment:   Intra-op Plan:   Post-operative Plan: Extubation in OR  Informed Consent: I have reviewed the patients History and Physical, chart, labs and discussed the procedure including the risks, benefits and alternatives for the proposed anesthesia with the patient or authorized representative who has indicated his/her understanding and acceptance.   Dental advisory given  Plan Discussed with: CRNA  Anesthesia Plan Comments:        Anesthesia Quick Evaluation

## 2018-06-11 NOTE — Interval H&P Note (Signed)
History and Physical Interval Note:  06/11/2018 3:02 PM  [name]  has presented today for surgery, with the diagnosis of BLEEDING  The various methods of treatment have been discussed with the patient and family. After consideration of risks, benefits and other options for treatment, the patient has consented to  Procedure(s): EXPLORATION LEFT GROIN (Left) as a surgical intervention .  The patient's history has been reviewed, patient examined, no change in status, stable for surgery.  I have reviewed the patient's chart and labs.  Questions were answered to the patient's satisfaction.     [name]

## 2018-06-11 NOTE — Progress Notes (Signed)
Patient dressing changed d/t oozing at JP drain site in R upper thigh. BP was 87/46 (MAP 60). Took a manual BP also - 95/50, both in right arm. HR is 61. Will continue to monitor. Victorino December, RN

## 2018-06-11 NOTE — Progress Notes (Addendum)
Progress Note  Patient Name: [name] Date of Encounter: 06/11/2018  Primary Cardiologist: Chrystie Nose, MD   Subjective   Pt states her abdominal pain is better today. Denies chest pain. She has not slept in 2 days  Inpatient Medications    Scheduled Meds: . sodium chloride   Intravenous Once  . sodium chloride   Intravenous Once  . atorvastatin  80 mg Oral q1800  . clopidogrel  75 mg Oral Q breakfast  . docusate sodium  100 mg Oral Daily  . famotidine  20 mg Oral Daily  . isosorbide mononitrate  30 mg Oral Daily  . pantoprazole  40 mg Oral Daily   Continuous Infusions: . sodium chloride 50 mL/hr at 06/11/18 0758  . heparin     PRN Meds: acetaminophen, alum & mag hydroxide-simeth, magnesium hydroxide, morphine injection, nitroGLYCERIN, ondansetron (ZOFRAN) IV, oxyCODONE-acetaminophen   Vital Signs    Vitals:   06/11/18 0555 06/11/18 0600 06/11/18 0850 06/11/18 0905  BP: (!) 90/50  (!) 98/40 (!) 93/46  Pulse:   62   Resp: (!) 23     Temp:   97.8 F (36.6 C) 98.1 F (36.7 C)  TempSrc:   Oral Oral  SpO2:   96%   Weight:  73.5 kg    Height:        Intake/Output Summary (Last 24 hours) at 06/11/2018 0925 Last data filed at 06/11/2018 0905 Gross per 24 hour  Intake 3540.17 ml  Output 600 ml  Net 2940.17 ml   Filed Weights   06/09/18 0406 06/10/18 0300 06/11/18 0600  Weight: 72.5 kg 72.4 kg 73.5 kg    Telemetry    Sinus with PVCs - Personally Reviewed  ECG    No new tracings - Personally Reviewed  Physical Exam   GEN: No acute distress.   Neck: No JVD Cardiac: irregular rhythm, regular rate, + aortic valve click Respiratory: Clear to auscultation bilaterally. GI: Soft, nontender, non-distended  MS: No edema; - left groin with pressure dressing Neuro:  Nonfocal  Psych: Normal affect   Labs    Chemistry Recent Labs  Lab 06/07/18 1406 06/08/18 0834 06/09/18 0839 06/10/18 0016  NA 140 138 139 140  K 4.1 4.0 3.9 4.5  CL 107 105   --  108  CO2 25 24  --  23  GLUCOSE 107* 131*  --  139*  BUN 15 11  --  10  CREATININE 1.14* 1.00  --  0.97  CALCIUM 9.0 8.8*  --  8.8*  PROT 6.4*  --   --   --   ALBUMIN 3.2*  --   --   --   AST 34  --   --   --   ALT 32  --   --   --   ALKPHOS 123  --   --   --   BILITOT 1.2  --   --   --   GFRNONAA 49* 58*  --  60*  GFRAA 57* >60  --  >60  ANIONGAP 8 9  --  9     Hematology Recent Labs  Lab 06/10/18 0016 06/10/18 1014 06/11/18 0505  WBC 10.2 13.5* 11.4*  RBC 3.36* 3.02* 2.74*  HGB 8.7* 8.0* 7.3*  HCT 27.7* 24.6* 22.8*  MCV 82.4 81.5 83.2  MCH 25.9* 26.5 26.6  MCHC 31.4 32.5 32.0  RDW 17.8* 17.6* 18.0*  PLT 354 368 363    Cardiac Enzymes Recent Labs  Lab 06/07/18  2041 06/08/18 0235 06/08/18 0834  TROPONINI <0.03 <0.03 <0.03    Recent Labs  Lab 06/07/18 1414  TROPIPOC 0.00     BNPNo results for input(s): BNP, PROBNP in the last 168 hours.   DDimer No results for input(s): DDIMER in the last 168 hours.   Radiology    No results found.  Cardiac Studies   L groin u/s 06/07/18:  An area with well defined borders measuring 2.6 cm x 2.9 cm was visualized arising off of the distal external/proximal common femoral artery with ultrasound characteristics of a pseudoaneurysm. The neck measures approximately 0.3 cm wide and 0.7 cm long.   Cardiac catheterization 05/20/2018:  Ost LM to LM lesion is 95% stenosed.  Ost LAD to Prox LAD lesion is 100% stenosed.  Ost 1st Mrg lesion is 90% stenosed.  Ost RCA to Prox RCA lesion is 100% stenosed. Not injected today.  SVG Y-graft to LAD and circumflex. Possible Origin lesion is 90% stenosed of the prior SVG stent from 2017.  Normal right heart pressures.  Ao sat 98%, PA sat 76%, CO 6.4 L/min; CI 3.5; mean PA 13 mm Hg; mean PCWP 8 mm Hg  Thoracic aortic tortuosity made catheter manipulation more difficult.  Chest pain at the conclusion of the procedure was relieved with IV NTG. Significant ST depressions when  she had CP and increased BP . Will need close monitoring of BP.  Possible severe in-stent restenosis of 4.0 mm Synergy stent from 2017.Severe native disease including left main. There is a long segment of stent hanging outside of the graft, which makes it difficult to selectively engage the vessel and know if the portion of the stent inside the vessel is patent.  Prox LAD is occluded. Fixing native left main would not help anterior wall.   If Graft stent has severe restenosis once, and restenting may not give a different longterm result.   Need to know LV function. Check echo. Would see if she is a redo candidate as LIMA was not used the last surgery.   Will check options for revascularization.   Higher contrast load due to difficulty finding grafts Recheck kidney function in AM.  Consider CT scan to see if we can better define patency of the stent.   Given her chest pain and ECG changes, I suspect there is significant disease in the stent causing her ischemia.   Cardiac catheterization 05/21/2018:  Ost LM lesion is 95% stenosed.  A drug-eluting stent was successfully placed using a STENT RESOLUTE ONYX 5.0X12.  Post intervention, there is a 0% residual stenosis.  IABP placed preprocedure for left main intervention.  Prox LAD lesion is 10% stenosed.   Recommend to resume Warfarin, at currently prescribed dose and frequency, on 05/21/18. Recommend concurrent antiplatelet therapy of Aspirin 81mg  daily for 1 monthand Clopidogrel 75mg  daily for indefinitely, as long as there are no bleeding problems.  Patient Profile     67 y.o. female with a hx of HTN, CAD, dilated ascending aorta at cath 11/03/12 s/p root andmechanicalAVR 11/05/12 andCAD status post with SVG to LAD and SVG to circumflex, complicated by pericardial effusion without tamponade requiring subxiphoid window, subclavian artery stenosis status post bypass in 2001,NSTEMIin 2017 with ischemic  cardiomyopathy with an EF of 40 to 45%, heart cath resulting in PCI of the SVG to LAD, EF normalized in 2018.She also has a history of sarcoidosis dyslipidemia, and contrast allergyseen in the office on 06/07/18 with post cath groin concerns found to havepseudoaneurysmby ultrasound.  Assessment & Plan    1. Pseudoaneurysm post cath site - appreciate vascular surgery recs - pressure dressing in place, bleeds with any movement  2. Blood loss anemia - held off on restarting coumadin yesterday - Hb down 8.7 yesterday, stopped ASA - continued heparin drip - vascular surgery ordered 2U PRBC for Hb 7.3 today  3. Mechanical aortic valve - heparin drip running  4. CAD s/p PCI 05/2018 - was on triple therapy with ASA, plavix, and coumadin - continue only plavix for now  5. Epigastric pain, left upper abdominal pain - amylase and lipase were elevated - will consult GI for guidance  6. PVCs and bigeminy - Mg WNL  7. Abnormal EKG in office - denies anginal symptoms  8. Hematuria - denies further hematuria - follow output    For questions or updates, please contact [facility] Please consult [email or web address] for contact info under        Signed, Marcelino Duster, PA  06/11/2018, 9:25 AM    I have examined the patient and reviewed assessment and plan and discussed with patient.  Agree with above as stated.  Doing well from a cardiac standpoint.  She states she has to go back to the OR for left groin repair.   Need to find out what type of prosthetic aortic valve she has.  If it is a [employer].Jude's could hold heparin IV for up to 7 days if needed.  Could certainly hold for at least a few days if that would help left groin site.  Would have to continue Plavix.    Lance Muss

## 2018-06-11 NOTE — Progress Notes (Signed)
ANTICOAGULATION CONSULT NOTE   Pharmacy Consult for heparin Indication: Mechanical AVR  Allergies  Allergen Reactions  . Contrast Media [Iodinated Diagnostic Agents] Hives, Itching and Swelling    Patient Measurements: Height: 5\' 7"  (170.2 cm) Weight: 162 lb (73.5 kg) IBW/kg (Calculated) : 61.6 Heparin Dosing Weight: 70 kg  Vital Signs: Temp: 98.1 F (36.7 C) (09/10 0905) Temp Source: Oral (09/10 0905) BP: 93/46 (09/10 0905) Pulse Rate: 62 (09/10 0850)  Labs: Recent Labs    06/09/18 1553 06/10/18 0016 06/10/18 0742 06/10/18 1014 06/10/18 1807 06/11/18 0505  HGB  --  8.7*  --  8.0*  --  7.3*  HCT  --  27.7*  --  24.6*  --  22.8*  PLT  --  354  --  368  --  363  LABPROT 18.8* 18.5*  --   --   --  18.4*  INR 1.59 1.56  --   --   --  1.54  HEPARINUNFRC  --  <0.10* 0.54  --  0.59  --   CREATININE  --  0.97  --   --   --   --     Estimated Creatinine Clearance: 56.2 mL/min (by C-G formula based on SCr of 0.97 mg/dL).   Medical History: Past Medical History:  Diagnosis Date  . Ascending aortic aneurysm (HCC)    a.  redo replacement of fusiform aneurysm of ascending aorta (complicated by pericardial hematoma s/p evacuation) in 2014.  2015 Contrast media allergy   . Coronary artery disease    a. s/p CABG x2 in 2014. b. s/p DES to SVG-LAD in 2017.  . H/O mechanical aortic valve replacement   . Hyperlipidemia   . Hypertension   . Ischemic cardiomyopathy    a. EF 40-45% in 2017 at time of NSTEMI, improved to normal in 2018.  . Non-STEMI (non-ST elevated myocardial infarction) (HCC)   . Premature atrial contractions   . PVC's (premature ventricular contractions)   . RA (rheumatoid arthritis) (HCC)   . S/P AVR (aortic valve replacement) 08/1999   [employer] Jude AVR for aortic insuff, Goal INR 2.5-3.5  . 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 11/2012) - complicated by pericardial hematoma -->  subxiphoid pericardial exposure and evacuation b. Cath 06/2016: DES to SVG-LAD ostium  . Sarcoid   . Second degree AV block    a. ? possibly seen in 12/2016 clinic visit - EKG not truly suggestive  . Severe anemia   . 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 s/p left subclavian bypass.    Medications:  Medications Prior to Admission  Medication Sig Dispense Refill Last Dose  . acetaminophen (TYLENOL) 500 MG tablet Take 500-1,000 mg by mouth every 6 (six) hours as needed (pain).   06/06/2018 at pm  . aspirin EC 81 MG EC tablet Take 1 tablet (81 mg total) by mouth daily. (Patient taking differently: Take 81 mg by mouth daily at 6 PM. )   06/06/2018 at pm  . atorvastatin (LIPITOR) 80 MG tablet TAKE 1 TABLET (80 MG TOTAL) BY MOUTH DAILY AT 6 PM. 90 tablet 2 06/06/2018 at pm  . Calcium Carb-Cholecalciferol (CALCIUM 600+D3 PO) Take 1 tablet by mouth daily.    06/06/2018 at am  . clopidogrel (PLAVIX) 75 MG tablet TAKE 1 TABLET BY MOUTH DAILY WITH BREAKFAST (Patient taking differently: Take 75 mg by mouth daily. ) 90  tablet 3 06/06/2018 at am  . Diclofenac Sodium 3 % GEL Place 1 application onto the skin 2 (two) times daily. To affected area. (Patient taking differently: Place 1 application onto the skin 2 (two) times daily as needed (arthritis pain). ) 100 g 0 2-3 weeks ago  . isosorbide mononitrate (IMDUR) 30 MG 24 hr tablet TAKE 1 TABLET BY MOUTH EVERY DAY (Patient taking differently: Take 30 mg by mouth daily. ) 30 tablet 6 06/06/2018 at am  . lisinopril (PRINIVIL,ZESTRIL) 2.5 MG tablet Take 1 tablet (2.5 mg total) by mouth daily. 90 tablet 4 06/06/2018 at am  . Multiple Vitamin (MULTIVITAMIN WITH MINERALS) TABS tablet Take 1 tablet by mouth daily.   06/06/2018 at am  . nitroGLYCERIN (NITROSTAT) 0.4 MG SL tablet PLACE 1 TABLET (0.4 MG TOTAL) UNDER THE TONGUE EVERY 5 (FIVE) MINUTES AS NEEDED FOR CHEST PAIN. 25 tablet 6 2-3 weeks agop  . ondansetron (ZOFRAN) 4 MG tablet Take 1 tablet (4 mg total) by mouth every  6 (six) hours. (Patient taking differently: Take 2-4 mg by mouth daily. ) 12 tablet 0 06/06/2018 at am  . pantoprazole (PROTONIX) 40 MG tablet TAKE 1 TABLET BY MOUTH EVERY DAY (Patient taking differently: Take 40 mg by mouth daily. ) 30 tablet 6 06/06/2018 at am  . polyvinyl alcohol (ARTIFICIAL TEARS) 1.4 % ophthalmic solution Place 1-2 drops into both eyes daily as needed for dry eyes.   06/07/2018 at am  . warfarin (COUMADIN) 5 MG tablet TAKE 1/2 TO 1 TABLET BY MOUTH DAILY OR AS DIRECTED BY COUMADIN CLINIC (Patient taking differently: Take 2.5-5 mg by mouth See admin instructions. Take 1/2 tablet (2.5 mg) by mouth on Monday and Thursday at 6pm, take 1 tablet (5 mg) on Sunday, Tuesday, Wednesday, Friday, Saturday at 6pm) 90 tablet 0 06/06/2018 at 1800  . diazepam (VALIUM) 2 MG tablet Take 1 tablet (2 mg total) by mouth every 8 (eight) hours as needed for muscle spasms. (Patient not taking: Reported on 06/07/2018) 10 tablet 0 Not Taking at Unknown time  . enoxaparin (LOVENOX) 80 MG/0.8ML injection Inject 0.8 mLs (80 mg total) into the skin every 12 (twelve) hours for 7 doses. (Patient not taking: Reported on 06/07/2018) 7 Syringe 0 Not Taking at Unknown time  . fluticasone (FLONASE) 50 MCG/ACT nasal spray Place 1 spray into both nostrils daily. (Patient not taking: Reported on 06/07/2018) 16 g 2 Not Taking at Unknown time    Assessment:  59 YOF with a mechanical AVR on warfarin at home here with pseudoaneurysm near her PCI groin site now s/p repair with VVS 9/8. Pt having continuous bleeding from JP drain site, Hgb now trending down. Per VVS, pt to receive 2 units pRBCs and heparin to be held x3 hr. No heparin level yet this morning - will resume at previously reduced rate of 750 units/hr. Aspirin has been held, Plavix needed for recent cardiac stent, holding off on resuming warfarin for now.  Home warfarin dose = 2.5mg  Mon/Thurs; 5mg  all other bdays  Goal of Therapy:  Heparin level 0.3-0.5 units/ml Monitor  platelets by anticoagulation protocol: Yes   Plan:  -Resume IV heparin 750 units/h with no bolus at 1100 -Recheck 6hr heparin level -Monitor S/Sx bleeding closely   , PharmD, BCPS Clinical Pharmacist [phone] Please check AMION for all [facility] numbers 06/11/2018

## 2018-06-11 NOTE — Progress Notes (Signed)
Patient currently in the operating room and I tried to call the referring doctor back at the number I had an left 2 messages and her hospital computer chart was reviewed and it looks like she has pancreatitis but according to her nurse and she was not having any pain today and she ate a normal breakfast and she does have gallstones on ultrasound which is the most likely etiology although medicines might be the next possibility and if he still have concerns about pancreatitis I would proceed with a CT scan next and I will add a set of liver tests although they were normal on admission and when appropriate will need to see a surgeon regarding cholecystectomy and please call me tomorrow if I can be of any further assistance

## 2018-06-12 ENCOUNTER — Encounter (HOSPITAL_COMMUNITY): Payer: Self-pay | Admitting: Vascular Surgery

## 2018-06-12 LAB — TYPE AND SCREEN
ABO/RH(D): O POS
ANTIBODY SCREEN: NEGATIVE
Unit division: 0
Unit division: 0

## 2018-06-12 LAB — CBC
HEMATOCRIT: 27.3 % — AB (ref 36.0–46.0)
Hemoglobin: 8.8 g/dL — ABNORMAL LOW (ref 12.0–15.0)
MCH: 26.9 pg (ref 26.0–34.0)
MCHC: 32.2 g/dL (ref 30.0–36.0)
MCV: 83.5 fL (ref 78.0–100.0)
Platelets: 307 10*3/uL (ref 150–400)
RBC: 3.27 MIL/uL — ABNORMAL LOW (ref 3.87–5.11)
RDW: 18.1 % — ABNORMAL HIGH (ref 11.5–15.5)
WBC: 10 10*3/uL (ref 4.0–10.5)

## 2018-06-12 LAB — COMPREHENSIVE METABOLIC PANEL
ALBUMIN: 2.3 g/dL — AB (ref 3.5–5.0)
ALK PHOS: 193 U/L — AB (ref 38–126)
ALT: 98 U/L — AB (ref 0–44)
AST: 53 U/L — AB (ref 15–41)
Anion gap: 6 (ref 5–15)
BUN: 10 mg/dL (ref 8–23)
CALCIUM: 8.4 mg/dL — AB (ref 8.9–10.3)
CO2: 26 mmol/L (ref 22–32)
CREATININE: 0.94 mg/dL (ref 0.44–1.00)
Chloride: 111 mmol/L (ref 98–111)
GFR calc Af Amer: >60 mL/min (ref >60)
GFR calc non Af Amer: >60 mL/min (ref >60)
GLUCOSE: 113 mg/dL — AB (ref 70–99)
Potassium: 4.2 mmol/L (ref 3.5–5.1)
SODIUM: 143 mmol/L (ref 135–145)
Total Bilirubin: 1 mg/dL (ref 0.3–1.2)
Total Protein: 4.8 g/dL — ABNORMAL LOW (ref 6.5–8.1)

## 2018-06-12 LAB — BPAM RBC
BLOOD PRODUCT EXPIRATION DATE: 201910062359
Blood Product Expiration Date: 201910062359
ISSUE DATE / TIME: 201909101201
ISSUE DATE / TIME: 201909100844
UNIT TYPE AND RH: 5100
Unit Type and Rh: 5100

## 2018-06-12 LAB — HEPARIN LEVEL (UNFRACTIONATED)
Heparin Unfractionated: 0.24 IU/mL — ABNORMAL LOW (ref 0.30–0.70)
Heparin Unfractionated: 0.27 IU/mL — ABNORMAL LOW (ref 0.30–0.70)

## 2018-06-12 LAB — LIPASE, BLOOD: Lipase: 51 U/L (ref 11–51)

## 2018-06-12 LAB — PROTIME-INR
INR: 1.34
Prothrombin Time: 16.5 seconds — ABNORMAL HIGH (ref 11.4–15.2)

## 2018-06-12 MED ORDER — WARFARIN - PHARMACIST DOSING INPATIENT
Freq: Every day | Status: DC
  Administered 2018-06-13: 18:00:00

## 2018-06-12 MED ORDER — WARFARIN SODIUM 5 MG PO TABS
5.0000 mg | ORAL_TABLET | Freq: Once | ORAL | Status: AC
  Administered 2018-06-12: 5 mg via ORAL
  Filled 2018-06-12: qty 1

## 2018-06-12 NOTE — Anesthesia Postprocedure Evaluation (Signed)
Anesthesia Post Note  Patient: [name]  Procedure(s) Performed: EXPLORATION LEFT GROIN FOR BLEEDING (Left Groin)     Patient location during evaluation: PACU Anesthesia Type: General Level of consciousness: awake and alert Pain management: pain level controlled Vital Signs Assessment: post-procedure vital signs reviewed and stable Respiratory status: spontaneous breathing, nonlabored ventilation, respiratory function stable and patient connected to nasal cannula oxygen Cardiovascular status: blood pressure returned to baseline and stable Postop Assessment: no apparent nausea or vomiting Anesthetic complications: no    Last Vitals:  Vitals:   06/12/18 0628 06/12/18 0732  BP: (!) 99/46 108/66  Pulse:  64  Resp:  16  Temp:  36.7 C  SpO2:  100%    Last Pain:  Vitals:   06/12/18 1252  TempSrc:   PainSc: 3                  Trevor Iha

## 2018-06-12 NOTE — Progress Notes (Signed)
ANTICOAGULATION CONSULT NOTE   Pharmacy Consult for heparin Indication: Mechanical AVR  Allergies  Allergen Reactions  . Contrast Media [Iodinated Diagnostic Agents] Hives, Itching and Swelling    Patient Measurements: Height: 5' 7.01" (170.2 cm) Weight: 164 lb 0.4 oz (74.4 kg) IBW/kg (Calculated) : 61.62 Heparin Dosing Weight: 70 kg  Vital Signs: Temp: 98.1 F (36.7 C) (09/11 1953) Temp Source: Oral (09/11 1953) BP: 107/33 (09/11 1953) Pulse Rate: 125 (09/11 1953)  Labs: Recent Labs    06/10/18 0016  06/10/18 1014 06/10/18 1807 06/11/18 0505 06/11/18 1548 06/12/18 0333 06/12/18 0956 06/12/18 2041  HGB 8.7*  --  8.0*  --  7.3* 8.8* 8.8*  --   --   HCT 27.7*  --  24.6*  --  22.8* 26.0* 27.3*  --   --   PLT 354  --  368  --  363  --  307  --   --   LABPROT 18.5*  --   --   --  18.4*  --  16.5*  --   --   INR 1.56  --   --   --  1.54  --  1.34  --   --   HEPARINUNFRC <0.10*   < >  --  0.59  --   --   --  0.24* 0.27*  CREATININE 0.97  --   --   --   --   --  0.94  --   --    < > = values in this interval not displayed.    Estimated Creatinine Clearance: 62.8 mL/min (by C-G formula based on SCr of 0.94 mg/dL).   Medical History: Past Medical History:  Diagnosis Date  . Ascending aortic aneurysm (HCC)    a.  redo replacement of fusiform aneurysm of ascending aorta (complicated by pericardial hematoma s/p evacuation) in 2014.  Marland Kitchen Contrast media allergy   . Coronary artery disease    a. s/p CABG x2 in 2014. b. s/p DES to SVG-LAD in 2017.  . H/O mechanical aortic valve replacement   . Hyperlipidemia   . Hypertension   . Ischemic cardiomyopathy    a. EF 40-45% in 2017 at time of NSTEMI, improved to normal in 2018.  . Non-STEMI (non-ST elevated myocardial infarction) (HCC)   . Premature atrial contractions   . PVC's (premature ventricular contractions)   . RA (rheumatoid arthritis) (HCC)   . S/P AVR (aortic valve replacement) 08/1999   St. Jude AVR for aortic  insuff, Goal INR 2.5-3.5  . 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 Donata Clay) - complicated by pericardial hematoma --> subxiphoid pericardial exposure and evacuation b. Cath 06/2016: DES to SVG-LAD ostium  . Sarcoid   . Second degree AV block    a. ? possibly seen in 12/2016 clinic visit - EKG not truly suggestive  . Severe anemia   . 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 s/p left subclavian bypass.    Medications:  Medications Prior to Admission  Medication Sig Dispense Refill Last Dose  . acetaminophen (TYLENOL) 500 MG tablet Take 500-1,000 mg by mouth every 6 (six) hours as needed (pain).   06/06/2018 at pm  . aspirin EC 81 MG EC tablet Take 1 tablet (81 mg total) by mouth daily. (Patient taking differently: Take 81 mg by mouth daily at 6 PM. )   06/06/2018 at pm  .  atorvastatin (LIPITOR) 80 MG tablet TAKE 1 TABLET (80 MG TOTAL) BY MOUTH DAILY AT 6 PM. 90 tablet 2 06/06/2018 at pm  . Calcium Carb-Cholecalciferol (CALCIUM 600+D3 PO) Take 1 tablet by mouth daily.    06/06/2018 at am  . clopidogrel (PLAVIX) 75 MG tablet TAKE 1 TABLET BY MOUTH DAILY WITH BREAKFAST (Patient taking differently: Take 75 mg by mouth daily. ) 90 tablet 3 06/06/2018 at am  . Diclofenac Sodium 3 % GEL Place 1 application onto the skin 2 (two) times daily. To affected area. (Patient taking differently: Place 1 application onto the skin 2 (two) times daily as needed (arthritis pain). ) 100 g 0 2-3 weeks ago  . isosorbide mononitrate (IMDUR) 30 MG 24 hr tablet TAKE 1 TABLET BY MOUTH EVERY DAY (Patient taking differently: Take 30 mg by mouth daily. ) 30 tablet 6 06/06/2018 at am  . lisinopril (PRINIVIL,ZESTRIL) 2.5 MG tablet Take 1 tablet (2.5 mg total) by mouth daily. 90 tablet 4 06/06/2018 at am  . Multiple Vitamin (MULTIVITAMIN WITH MINERALS) TABS tablet Take 1 tablet by mouth daily.   06/06/2018 at am  . nitroGLYCERIN (NITROSTAT) 0.4 MG SL  tablet PLACE 1 TABLET (0.4 MG TOTAL) UNDER THE TONGUE EVERY 5 (FIVE) MINUTES AS NEEDED FOR CHEST PAIN. 25 tablet 6 2-3 weeks agop  . ondansetron (ZOFRAN) 4 MG tablet Take 1 tablet (4 mg total) by mouth every 6 (six) hours. (Patient taking differently: Take 2-4 mg by mouth daily. ) 12 tablet 0 06/06/2018 at am  . pantoprazole (PROTONIX) 40 MG tablet TAKE 1 TABLET BY MOUTH EVERY DAY (Patient taking differently: Take 40 mg by mouth daily. ) 30 tablet 6 06/06/2018 at am  . polyvinyl alcohol (ARTIFICIAL TEARS) 1.4 % ophthalmic solution Place 1-2 drops into both eyes daily as needed for dry eyes.   06/07/2018 at am  . warfarin (COUMADIN) 5 MG tablet TAKE 1/2 TO 1 TABLET BY MOUTH DAILY OR AS DIRECTED BY COUMADIN CLINIC (Patient taking differently: Take 2.5-5 mg by mouth See admin instructions. Take 1/2 tablet (2.5 mg) by mouth on Monday and Thursday at 6pm, take 1 tablet (5 mg) on Sunday, Tuesday, Wednesday, Friday, Saturday at 6pm) 90 tablet 0 06/06/2018 at 1800  . diazepam (VALIUM) 2 MG tablet Take 1 tablet (2 mg total) by mouth every 8 (eight) hours as needed for muscle spasms. (Patient not taking: Reported on 06/07/2018) 10 tablet 0 Not Taking at Unknown time  . enoxaparin (LOVENOX) 80 MG/0.8ML injection Inject 0.8 mLs (80 mg total) into the skin every 12 (twelve) hours for 7 doses. (Patient not taking: Reported on 06/07/2018) 7 Syringe 0 Not Taking at Unknown time  . fluticasone (FLONASE) 50 MCG/ACT nasal spray Place 1 spray into both nostrils daily. (Patient not taking: Reported on 06/07/2018) 16 g 2 Not Taking at Unknown time    Assessment:  33 YOF with a mechanical AVR on warfarin at home here with pseudoaneurysm near her PCI groin site now s/p repair with VVS 9/8. Repair complicated by significant bleeding and fall in hemoglobin. Pt now s/p re-exploration in OR with no source identified but hematoma washed out.  Heparin resumed this morning, per VVS okay to resume warfarin as well. INR 1.34, Hgb stable at 8.8 s/p  pRBCs yesterday.  Heparin level still subtherapeutic at 0.27, on 800 units/hr. No infusion issues or s/sx of bleeding per nursing.   Home warfarin dose = 2.5mg  Mon/Thurs; 5mg  all other bdays  Goal of Therapy:  INR 2-3 Heparin level  0.3-0.5 units/ml Monitor platelets by anticoagulation protocol: Yes   Plan:  -Increase heparin slightly to 850 units/hr -Check 6hr heparin level -Daily heparin level, CBC, INR  Girard Cooter, PharmD Clinical Pharmacist  Pager: [phone] Phone: [phone] Please check AMION for all [facility] Pharmacy numbers 06/12/2018

## 2018-06-12 NOTE — Evaluation (Signed)
Physical Therapy Evaluation Patient Details Name: [name] MRN: [id number] DOB: 05/14/1951 Today's Date: 06/12/2018   History of Present Illness  Pt is a 67 yo female admitted 06/07/18 with post cath groin concerns found to have pseudoaneurysm by ultrasound. 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 Pt with persistent bleeding from L groin incision s/p exploration of wound and drain placement 9/10. PHM includes CAD s/p CABG, HTN, NSTEMI, RA, severe anemia, AVR.    Clinical Impression  Pt presents with an overall decrease in functional mobility secondary to above. PTA, pt indep and lives alone; has 12 steps to enter apartment. Discussed potential for temporary RW use to offload LLE pain while walking; pt not agreeable to gait training this session due to pain and fatigue. Agreeable to seated therex. Expect pt to progress well with mobility. Education on importance of OOB activity. Pt would benefit from continued acute PT services to maximize functional mobility and independence prior to d/c with HHPT services.     Follow Up Recommendations Home health PT;Supervision - Intermittent    Equipment Recommendations  Rolling walker with 5" wheels    Recommendations for Other Services       Precautions / Restrictions Precautions Precautions: Fall Restrictions Weight Bearing Restrictions: No      Mobility  Bed Mobility Overal bed mobility: Modified Independent             General bed mobility comments: HOB up and increased time due to pain  Transfers Overall transfer level: Needs assistance Equipment used: None Transfers: Sit to/from Stand Sit to Stand: Supervision         General transfer comment: Heavy reliance on BUE support to push into standing wanting to keep L knee ext due to incision pain  Ambulation/Gait             General Gait Details: Pt declined secondary to pain and fatigue. Discussed potential for RW use due to LLE pain  Stairs             Wheelchair Mobility    Modified Rankin (Stroke Patients Only)       Balance Overall balance assessment: Needs assistance Sitting-balance support: No upper extremity supported;Feet supported Sitting balance-Leahy Scale: Good       Standing balance-Leahy Scale: Fair Standing balance comment: Can static stand with no UE support                             Pertinent Vitals/Pain Pain Assessment: Faces Faces Pain Scale: Hurts little more Pain Location: L groin incision Pain Descriptors / Indicators: Sore Pain Intervention(s): Monitored during session;Limited activity within patient's tolerance    Home Living Family/patient expects to be discharged to:: Private residence Living Arrangements: Alone Available Help at Discharge: Family;Available PRN/intermittently Type of Home: Apartment Home Access: Stairs to enter Entrance Stairs-Rails: Left Entrance Stairs-Number of Steps: 12 Home Layout: One level Home Equipment: None      Prior Function Level of Independence: Independent               Hand Dominance   Dominant Hand: Right    Extremity/Trunk Assessment   Upper Extremity Assessment Upper Extremity Assessment: Overall WFL for tasks assessed    Lower Extremity Assessment Lower Extremity Assessment: LLE deficits/detail LLE Deficits / Details: s/p pseudoaneurysm repair; hip flex limited by incision pain; knee flex/ext [facility]       Communication   Communication: No difficulties  Cognition Arousal/Alertness: Awake/alert Behavior During  Therapy: Flat affect Overall Cognitive Status: Within Functional Limits for tasks assessed                                        General Comments      Exercises General Exercises - Lower Extremity Long Arc Quad: AROM;Left;Seated Heel Slides: AROM;Left;Supine Hip Flexion/Marching: AROM;Left;Seated   Assessment/Plan    PT Assessment Patient needs continued PT services  PT Problem List  Decreased strength;Decreased activity tolerance;Decreased balance;Decreased mobility;Decreased knowledge of use of DME       PT Treatment Interventions DME instruction;Gait training;Stair training;Functional mobility training;Therapeutic activities;Therapeutic exercise;Balance training;Patient/family education    PT Goals (Current goals can be found in the Care Plan section)  Acute Rehab PT Goals Patient Stated Goal: "Keep my incisions from breaking open" PT Goal Formulation: With patient Time For Goal Achievement: 06/26/18 Potential to Achieve Goals: Good    Frequency Min 3X/week   Barriers to discharge Decreased caregiver support;Inaccessible home environment      Co-evaluation               AM-PAC PT "6 Clicks" Daily Activity  Outcome Measure Difficulty turning over in bed (including adjusting bedclothes, sheets and blankets)?: None Difficulty moving from lying on back to sitting on the side of the bed? : None Difficulty sitting down on and standing up from a chair with arms (e.g., wheelchair, bedside commode, etc,.)?: A Little Help needed moving to and from a bed to chair (including a wheelchair)?: A Little Help needed walking in hospital room?: A Little Help needed climbing 3-5 steps with a railing? : A Lot 6 Click Score: 19    End of Session Equipment Utilized During Treatment: Gait belt Activity Tolerance: Patient limited by fatigue;Patient limited by pain Patient left: in bed;with call bell/phone within reach Nurse Communication: Mobility status PT Visit Diagnosis: Other abnormalities of gait and mobility (R26.89);Pain Pain - Right/Left: Left Pain - part of body: Leg    Time: 1448-1856 PT Time Calculation (min) (ACUTE ONLY): 11 min   Charges:   PT Evaluation $PT Eval Moderate Complexity: 1 Mod         Ina Homes, PT, DPT Acute Rehabilitation Services  Pager [phone] Office [phone]  Malachy Chamber 06/12/2018, 9:33 AM

## 2018-06-12 NOTE — Progress Notes (Signed)
ANTICOAGULATION CONSULT NOTE   Pharmacy Consult for heparin Indication: Mechanical AVR  Allergies  Allergen Reactions  . Contrast Media [Iodinated Diagnostic Agents] Hives, Itching and Swelling    Patient Measurements: Height: 5' 7.01" (170.2 cm) Weight: 164 lb 0.4 oz (74.4 kg) IBW/kg (Calculated) : 61.62 Heparin Dosing Weight: 70 kg  Vital Signs: Temp: 98 F (36.7 C) (09/11 0732) Temp Source: Oral (09/11 0732) BP: 108/66 (09/11 0732) Pulse Rate: 64 (09/11 0732)  Labs: Recent Labs    06/10/18 0016 06/10/18 0742 06/10/18 1014 06/10/18 1807 06/11/18 0505 06/11/18 1548 06/12/18 0333  HGB 8.7*  --  8.0*  --  7.3* 8.8* 8.8*  HCT 27.7*  --  24.6*  --  22.8* 26.0* 27.3*  PLT 354  --  368  --  363  --  307  LABPROT 18.5*  --   --   --  18.4*  --  16.5*  INR 1.56  --   --   --  1.54  --  1.34  HEPARINUNFRC <0.10* 0.54  --  0.59  --   --   --   CREATININE 0.97  --   --   --   --   --  0.94    Estimated Creatinine Clearance: 62.8 mL/min (by C-G formula based on SCr of 0.94 mg/dL).   Medical History: Past Medical History:  Diagnosis Date  . Ascending aortic aneurysm (HCC)    a.  redo replacement of fusiform aneurysm of ascending aorta (complicated by pericardial hematoma s/p evacuation) in 2014.  Marland Kitchen Contrast media allergy   . Coronary artery disease    a. s/p CABG x2 in 2014. b. s/p DES to SVG-LAD in 2017.  . H/O mechanical aortic valve replacement   . Hyperlipidemia   . Hypertension   . Ischemic cardiomyopathy    a. EF 40-45% in 2017 at time of NSTEMI, improved to normal in 2018.  . Non-STEMI (non-ST elevated myocardial infarction) (HCC)   . Premature atrial contractions   . PVC's (premature ventricular contractions)   . RA (rheumatoid arthritis) (HCC)   . S/P AVR (aortic valve replacement) 08/1999   [employer] AVR for aortic insuff, Goal INR 2.5-3.5  . 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 SVG to LAD, SVG to Cfx; re-do replacement of fusiform aneursym of ascending thoracic  aorta (Dr. Donata Clay) - complicated by pericardial hematoma --> subxiphoid pericardial exposure and evacuation b. Cath 06/2016: DES to SVG-LAD ostium  . Sarcoid   . Second degree AV block    a. ? possibly seen in 12/2016 clinic visit - EKG not truly suggestive  . Severe anemia   . 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 s/p left subclavian bypass.    Medications:  Medications Prior to Admission  Medication Sig Dispense Refill Last Dose  . acetaminophen (TYLENOL) 500 MG tablet Take 500-1,000 mg by mouth every 6 (six) hours as needed (pain).   06/06/2018 at pm  . aspirin EC 81 MG EC tablet Take 1 tablet (81 mg total) by mouth daily. (Patient taking differently: Take 81 mg by mouth daily at 6 PM. )   06/06/2018 at pm  . atorvastatin (LIPITOR) 80 MG tablet TAKE 1 TABLET (80 MG TOTAL) BY MOUTH DAILY AT 6 PM. 90 tablet 2 06/06/2018 at pm  . Calcium Carb-Cholecalciferol (CALCIUM 600+D3 PO) Take 1 tablet by mouth daily.    06/06/2018 at am  . clopidogrel (PLAVIX) 75 MG tablet TAKE 1 TABLET BY MOUTH DAILY  WITH BREAKFAST (Patient taking differently: Take 75 mg by mouth daily. ) 90 tablet 3 06/06/2018 at am  . Diclofenac Sodium 3 % GEL Place 1 application onto the skin 2 (two) times daily. To affected area. (Patient taking differently: Place 1 application onto the skin 2 (two) times daily as needed (arthritis pain). ) 100 g 0 2-3 weeks ago  . isosorbide mononitrate (IMDUR) 30 MG 24 hr tablet TAKE 1 TABLET BY MOUTH EVERY DAY (Patient taking differently: Take 30 mg by mouth daily. ) 30 tablet 6 06/06/2018 at am  . lisinopril (PRINIVIL,ZESTRIL) 2.5 MG tablet Take 1 tablet (2.5 mg total) by mouth daily. 90 tablet 4 06/06/2018 at am  . Multiple Vitamin (MULTIVITAMIN WITH MINERALS) TABS tablet Take 1 tablet by mouth daily.   06/06/2018 at am  . nitroGLYCERIN (NITROSTAT) 0.4 MG SL tablet PLACE 1 TABLET (0.4 MG TOTAL) UNDER THE TONGUE EVERY 5 (FIVE) MINUTES AS NEEDED FOR CHEST PAIN. 25 tablet 6 2-3 weeks agop  . ondansetron  (ZOFRAN) 4 MG tablet Take 1 tablet (4 mg total) by mouth every 6 (six) hours. (Patient taking differently: Take 2-4 mg by mouth daily. ) 12 tablet 0 06/06/2018 at am  . pantoprazole (PROTONIX) 40 MG tablet TAKE 1 TABLET BY MOUTH EVERY DAY (Patient taking differently: Take 40 mg by mouth daily. ) 30 tablet 6 06/06/2018 at am  . polyvinyl alcohol (ARTIFICIAL TEARS) 1.4 % ophthalmic solution Place 1-2 drops into both eyes daily as needed for dry eyes.   06/07/2018 at am  . warfarin (COUMADIN) 5 MG tablet TAKE 1/2 TO 1 TABLET BY MOUTH DAILY OR AS DIRECTED BY COUMADIN CLINIC (Patient taking differently: Take 2.5-5 mg by mouth See admin instructions. Take 1/2 tablet (2.5 mg) by mouth on Monday and Thursday at 6pm, take 1 tablet (5 mg) on Sunday, Tuesday, Wednesday, Friday, Saturday at 6pm) 90 tablet 0 06/06/2018 at 1800  . diazepam (VALIUM) 2 MG tablet Take 1 tablet (2 mg total) by mouth every 8 (eight) hours as needed for muscle spasms. (Patient not taking: Reported on 06/07/2018) 10 tablet 0 Not Taking at Unknown time  . enoxaparin (LOVENOX) 80 MG/0.8ML injection Inject 0.8 mLs (80 mg total) into the skin every 12 (twelve) hours for 7 doses. (Patient not taking: Reported on 06/07/2018) 7 Syringe 0 Not Taking at Unknown time  . fluticasone (FLONASE) 50 MCG/ACT nasal spray Place 1 spray into both nostrils daily. (Patient not taking: Reported on 06/07/2018) 16 g 2 Not Taking at Unknown time    Assessment:  61 YOF with a mechanical AVR on warfarin at home here with pseudoaneurysm near her PCI groin site now s/p repair with VVS 9/8. Repair complicated by significant bleeding and fall in hemoglobin. Pt now s/p re-exploration in OR with no source identified but hematoma washed out.  Heparin resumed this morning, per VVS okay to resume warfarin as well. INR 1.34, Hgb stable at 8.8 s/p pRBCs yesterday. Heparin level slightly below goal at 0.24.   Home warfarin dose = 2.5mg  Mon/Thurs; 5mg  all other bdays  Goal of Therapy:  INR  2-3 Heparin level 0.3-0.5 units/ml Monitor platelets by anticoagulation protocol: Yes   Plan:  -Warfarin 5mg  PO tonight - will resume home regimen -Increase heparin slightly to 800 units/hr -Check 6hr heparin level -Daily heparin level, CBC, INR  , PharmD, BCPS Clinical Pharmacist [phone] Please check AMION for all [facility] numbers 06/12/2018

## 2018-06-12 NOTE — Progress Notes (Addendum)
Progress Note  Patient Name: [name] Date of Encounter: 06/12/2018  Primary Cardiologist: Chrystie Nose, MD   Subjective   Taken back to OR yesterday afternoon. She has pain at her groin site, pain meds helping. Hb stable. She does not feel palpitations.  Inpatient Medications    Scheduled Meds: . sodium chloride   Intravenous Once  . sodium chloride   Intravenous Once  . atorvastatin  80 mg Oral q1800  . clopidogrel  75 mg Oral Q breakfast  . docusate sodium  100 mg Oral Daily  . famotidine  20 mg Oral Daily  . pantoprazole  40 mg Oral Daily   Continuous Infusions: . sodium chloride 180 mL/hr at 06/11/18 1227  . heparin 750 Units/hr (06/12/18 0416)  . lactated ringers 10 mL/hr at 06/11/18 1431   PRN Meds: acetaminophen, alum & mag hydroxide-simeth, magnesium hydroxide, morphine injection, nitroGLYCERIN, ondansetron (ZOFRAN) IV, oxyCODONE-acetaminophen   Vital Signs    Vitals:   06/12/18 0415 06/12/18 0504 06/12/18 0628 06/12/18 0732  BP: (!) 98/37 (!) 106/42 (!) 99/46 108/66  Pulse: (!) 57   64  Resp: 19 14  16   Temp:    98 F (36.7 C)  TempSrc:    Oral  SpO2: 99% 98%  100%  Weight:      Height:        Intake/Output Summary (Last 24 hours) at 06/12/2018 0948 Last data filed at 06/12/2018 0416 Gross per 24 hour  Intake 3762.47 ml  Output 1050 ml  Net 2712.47 ml   Filed Weights   06/11/18 0600 06/11/18 1401 06/12/18 0300  Weight: 73.5 kg 73.5 kg 74.4 kg    Telemetry    Sinus but frequent PVCs - Personally Reviewed  ECG    No new tracings - Personally Reviewed  Physical Exam   GEN: No acute distress.   Neck: No JVD Cardiac: RRR, crisp valve click, + murmur Respiratory: Clear to auscultation bilaterally. GI: Soft, nontender, non-distended  MS: No edema; No deformity. Right groin with drain Neuro:  Nonfocal  Psych: Normal affect   Labs    Chemistry Recent Labs  Lab 06/07/18 1406 06/08/18 0834  06/10/18 0016 06/11/18 1548  06/12/18 0333  NA 140 138   < > 140 141 143  K 4.1 4.0   < > 4.5 4.3 4.2  CL 107 105  --  108  --  111  CO2 25 24  --  23  --  26  GLUCOSE 107* 131*  --  139* 99 113*  BUN 15 11  --  10  --  10  CREATININE 1.14* 1.00  --  0.97  --  0.94  CALCIUM 9.0 8.8*  --  8.8*  --  8.4*  PROT 6.4*  --   --   --   --  4.8*  ALBUMIN 3.2*  --   --   --   --  2.3*  AST 34  --   --   --   --  53*  ALT 32  --   --   --   --  98*  ALKPHOS 123  --   --   --   --  193*  BILITOT 1.2  --   --   --   --  1.0  GFRNONAA 49* 58*  --  60*  --  >60  GFRAA 57* >60  --  >60  --  >60  ANIONGAP 8 9  --  9  --  6   < > = values in this interval not displayed.     Hematology Recent Labs  Lab 06/10/18 1014 06/11/18 0505 06/11/18 1548 06/12/18 0333  WBC 13.5* 11.4*  --  10.0  RBC 3.02* 2.74*  --  3.27*  HGB 8.0* 7.3* 8.8* 8.8*  HCT 24.6* 22.8* 26.0* 27.3*  MCV 81.5 83.2  --  83.5  MCH 26.5 26.6  --  26.9  MCHC 32.5 32.0  --  32.2  RDW 17.6* 18.0*  --  18.1*  PLT 368 363  --  307    Cardiac Enzymes Recent Labs  Lab 06/07/18 2041 06/08/18 0235 06/08/18 0834  TROPONINI <0.03 <0.03 <0.03    Recent Labs  Lab 06/07/18 1414  TROPIPOC 0.00     BNPNo results for input(s): BNP, PROBNP in the last 168 hours.   DDimer No results for input(s): DDIMER in the last 168 hours.   Radiology    No results found.  Cardiac Studies   L groin u/s 06/07/18:  An area with well defined borders measuring 2.6 cm x 2.9 cm was visualized arising off of the distal external/proximal common femoral artery with ultrasound characteristics of a pseudoaneurysm. The neck measures approximately 0.3 cm wide and 0.7 cm long.   Cardiac catheterization 05/20/2018:  Ost LM to LM lesion is 95% stenosed.  Ost LAD to Prox LAD lesion is 100% stenosed.  Ost 1st Mrg lesion is 90% stenosed.  Ost RCA to Prox RCA lesion is 100% stenosed. Not injected today.  SVG Y-graft to LAD and circumflex. Possible Origin lesion is 90%  stenosed of the prior SVG stent from 2017.  Normal right heart pressures.  Ao sat 98%, PA sat 76%, CO 6.4 L/min; CI 3.5; mean PA 13 mm Hg; mean PCWP 8 mm Hg  Thoracic aortic tortuosity made catheter manipulation more difficult.  Chest pain at the conclusion of the procedure was relieved with IV NTG. Significant ST depressions when she had CP and increased BP . Will need close monitoring of BP.  Possible severe in-stent restenosis of 4.0 mm Synergy stent from 2017.Severe native disease including left main. There is a long segment of stent hanging outside of the graft, which makes it difficult to selectively engage the vessel and know if the portion of the stent inside the vessel is patent.  Prox LAD is occluded. Fixing native left main would not help anterior wall.   If Graft stent has severe restenosis once, and restenting may not give a different longterm result.   Need to know LV function. Check echo. Would see if she is a redo candidate as LIMA was not used the last surgery.   Will check options for revascularization.   Higher contrast load due to difficulty finding grafts Recheck kidney function in AM.  Consider CT scan to see if we can better define patency of the stent.   Given her chest pain and ECG changes, I suspect there is significant disease in the stent causing her ischemia.   Cardiac catheterization 05/21/2018:  Ost LM lesion is 95% stenosed.  A drug-eluting stent was successfully placed using a STENT RESOLUTE ONYX 5.0X12.  Post intervention, there is a 0% residual stenosis.  IABP placed preprocedure for left main intervention.  Prox LAD lesion is 10% stenosed.   Recommend to resume Warfarin, at currently prescribed dose and frequency, on 05/21/18. Recommend concurrent antiplatelet therapy of Aspirin 81mg  daily for 1 monthand Clopidogrel 75mg  daily for indefinitely, as long as there are no  bleeding problems.  Patient Profile     67  y.o. female with a hx of HTN, CAD, dilated ascending aorta at cath 11/03/12 s/p root andmechanicalAVR 11/05/12 andCAD status post with SVG to LAD and SVG to circumflex, complicated by pericardial effusion without tamponade requiring subxiphoid window, subclavian artery stenosis status post bypass in 2001,NSTEMIin 2017 with ischemic cardiomyopathy with an EF of 40 to 45%, heart cath resulting in PCI of the SVG to LAD, EF normalized in 2018.She also has a history of sarcoidosis dyslipidemia, and contrast allergyseen in the office on 06/07/18 with post cath groin concerns found to havepseudoaneurysmby ultrasound.  Assessment & Plan    1. Pseudoaneurysm, left groin, post-cath site - vascular on board - taken back to OR yesterday for generalized oozing/exploration - new blake placed - she is tolerating the pain well with pain meds   2. Blood loss anemia - vascular surgery gave 2U PRBC yesterday - Hb this morning stable - 8.8 - still on heparin drip for mechanical aortic valve - OK per vascular to start coumadin slowly - pharmacy consulted   3. Mechanical aortic valve - on heparin - resuming coumadin tonight   4. CAD s/p PCI 05/2018 - was on triple therapy: ASA, plavix, and coumadin - continue plavix only for now - will discuss with attending timing of ASA resumption   5. Epigastric pain, left upper abdominal pain - consulted GI yesterday, never received phone call back - GI suspects pancreatitis; however, symptoms have resolved and she is eating normally - lipase has normalized (1217 --> 51 today) - per GI, if further issues will consider CT scan   6. PVCs with bigeminy - pt asymptomatic   7. Abnormal EKG in office - normalized, no anginal symptoms   8. Hematuria - no further hematuria - will follow as we add back coumadin   For questions or updates, please contact [facility] Please consult [email or web address] for contact info under        Signed, Marcelino Duster, PA  06/12/2018, 9:48 AM    I have examined the patient and reviewed assessment and plan and discussed with patient.  Agree with above as stated.  Doing well from cardiac standpoint.  If heparin gtt needs to be held for another day, could do that.  Heparin was restarted this AM and she seems to be tolerating this well. No angian.   Lance Muss

## 2018-06-12 NOTE — Progress Notes (Addendum)
[facility]  Subjective  - Doing well over all.  Left thigh sore.  Objective (!) 99/46 (!) 57 98.2 F (36.8 C) (Oral) 14 98%  Intake/Output Summary (Last 24 hours) at 06/12/2018 0722 Last data filed at 06/12/2018 0416 Gross per 24 hour  Intake 4615.47 ml  Output 1050 ml  Net 3565.47 ml    Palpable left DP 2+ Left groin dry dressing in place, no active bleeding through dressing. JP bulb SS drainage 100 cc total since surgey  Assessment/Planning: POD # 1 Exploration of left groin wound and placement of 19 Blake drain POD# 3 Repair of left femoral artery pseudoaneurysm.   Post transfusion 2 units chronic anemia HGB 8.8 Heparin 750 units per hr continuous Advised to take it slow, will maintain drain.  Mosetta Pigeon 06/12/2018 7:22 AM --  Laboratory Lab Results: Recent Labs    06/11/18 0505 06/11/18 1548 06/12/18 0333  WBC 11.4*  --  10.0  HGB 7.3* 8.8* 8.8*  HCT 22.8* 26.0* 27.3*  PLT 363  --  307   BMET Recent Labs    06/10/18 0016 06/11/18 1548 06/12/18 0333  NA 140 141 143  K 4.5 4.3 4.2  CL 108  --  111  CO2 23  --  26  GLUCOSE 139* 99 113*  BUN 10  --  10  CREATININE 0.97  --  0.94  CALCIUM 8.8*  --  8.4*    COAG Lab Results  Component Value Date   INR 1.34 06/12/2018   INR 1.54 06/11/2018   INR 1.56 06/10/2018   No results found for: PTT  I have interviewed the patient and examined the patient. I agree with the findings by the PA. Can restart Coumadin tonight.  Will need to go very slow.  Cari Caraway, MD [phone]

## 2018-06-12 NOTE — Progress Notes (Signed)
Patient feeling slightly lightheaded. Pressures remain soft despite surgery to correct bleeding issue of last night. Manual BP 90/40. Will continue to monitor. Victorino December, RN

## 2018-06-12 NOTE — Plan of Care (Signed)

## 2018-06-12 NOTE — Care Management Note (Signed)
Case Management Note  Patient Details  Name: [name] MRN: [id number] Date of Birth: 1950/11/25  Subjective/Objective:   From home alone, pta indep, presents with pseudoaneurysm, per pt eval rec HHPT, NCM offered choice, she chose [facility] , referral given to [facility]. Soc will begin 24-48 hrs post dc. She states she does not want a rolling walker. Will need HHPT orders prior to dc.                  Action/Plan: DC home when ready.  Expected Discharge Date:  06/10/18               Expected Discharge Plan:  Home w Home Health Services  In-House Referral:     Discharge planning Services  CM Consult  Post Acute Care Choice:  Home Health Choice offered to:  Patient  DME Arranged:    DME Agency:     HH Arranged:  PT HH Agency:  [facility]  Status of Service:  In process, will continue to follow  If discussed at Long Length of Stay Meetings, dates discussed:    Additional Comments:  Jozlin, Bently, RN 06/12/2018, 1:02 PM

## 2018-06-13 LAB — PROTIME-INR
INR: 1.32
Prothrombin Time: 16.3 seconds — ABNORMAL HIGH (ref 11.4–15.2)

## 2018-06-13 LAB — CBC
HEMATOCRIT: 27.4 % — AB (ref 36.0–46.0)
Hemoglobin: 8.7 g/dL — ABNORMAL LOW (ref 12.0–15.0)
MCH: 26.8 pg (ref 26.0–34.0)
MCHC: 31.8 g/dL (ref 30.0–36.0)
MCV: 84.3 fL (ref 78.0–100.0)
Platelets: 295 10*3/uL (ref 150–400)
RBC: 3.25 MIL/uL — ABNORMAL LOW (ref 3.87–5.11)
RDW: 18.5 % — AB (ref 11.5–15.5)
WBC: 10.9 10*3/uL — AB (ref 4.0–10.5)

## 2018-06-13 LAB — HEPARIN LEVEL (UNFRACTIONATED): Heparin Unfractionated: 0.38 IU/mL (ref 0.30–0.70)

## 2018-06-13 MED ORDER — WARFARIN SODIUM 2.5 MG PO TABS
2.5000 mg | ORAL_TABLET | Freq: Once | ORAL | Status: AC
  Administered 2018-06-13: 2.5 mg via ORAL
  Filled 2018-06-13: qty 1

## 2018-06-13 NOTE — Progress Notes (Signed)
Progress Note  Patient Name: [name] Date of Encounter: 06/13/2018  Primary Cardiologist: Chrystie Nose, MD   Subjective   Pain better  Inpatient Medications    Scheduled Meds: . sodium chloride   Intravenous Once  . sodium chloride   Intravenous Once  . atorvastatin  80 mg Oral q1800  . clopidogrel  75 mg Oral Q breakfast  . docusate sodium  100 mg Oral Daily  . famotidine  20 mg Oral Daily  . pantoprazole  40 mg Oral Daily  . warfarin  2.5 mg Oral ONCE-1800  . Warfarin - Pharmacist Dosing Inpatient   Does not apply q1800   Continuous Infusions: . sodium chloride 50 mL/hr at 06/13/18 1054  . heparin 850 Units/hr (06/13/18 1057)  . lactated ringers 10 mL/hr at 06/11/18 1431   PRN Meds: acetaminophen, alum & mag hydroxide-simeth, magnesium hydroxide, morphine injection, nitroGLYCERIN, ondansetron (ZOFRAN) IV, oxyCODONE-acetaminophen   Vital Signs    Vitals:   06/12/18 2348 06/13/18 0400 06/13/18 0730 06/13/18 1306  BP: (!) 115/33 (!) 100/50 (!) 116/51 (!) 101/35  Pulse: (!) 55 61  60  Resp: 13 18 17 18   Temp: 98.1 F (36.7 C) 98 F (36.7 C) 97.9 F (36.6 C) (!) 97.4 F (36.3 C)  TempSrc: Oral Oral  Oral  SpO2: (!) 85% 97%  99%  Weight:  75.9 kg    Height:        Intake/Output Summary (Last 24 hours) at 06/13/2018 1310 Last data filed at 06/13/2018 1310 Gross per 24 hour  Intake 1402.87 ml  Output 920 ml  Net 482.87 ml   Filed Weights   06/11/18 1401 06/12/18 0300 06/13/18 0400  Weight: 73.5 kg 74.4 kg 75.9 kg    Telemetry    NSR - Personally Reviewed  ECG      Physical Exam   GEN: No acute distress.   Neck: No JVD Cardiac: RRR, no murmurs, rubs, or gallops.  Respiratory: Clear to auscultation bilaterally. GI: Soft, nontender, non-distended  MS: No edema; No deformity. Neuro:  Nonfocal  Psych: Normal affect   Labs    Chemistry Recent Labs  Lab 06/07/18 1406 06/08/18 0834  06/10/18 0016 06/11/18 1548 06/12/18 0333  NA  140 138   < > 140 141 143  K 4.1 4.0   < > 4.5 4.3 4.2  CL 107 105  --  108  --  111  CO2 25 24  --  23  --  26  GLUCOSE 107* 131*  --  139* 99 113*  BUN 15 11  --  10  --  10  CREATININE 1.14* 1.00  --  0.97  --  0.94  CALCIUM 9.0 8.8*  --  8.8*  --  8.4*  PROT 6.4*  --   --   --   --  4.8*  ALBUMIN 3.2*  --   --   --   --  2.3*  AST 34  --   --   --   --  53*  ALT 32  --   --   --   --  98*  ALKPHOS 123  --   --   --   --  193*  BILITOT 1.2  --   --   --   --  1.0  GFRNONAA 49* 58*  --  60*  --  >60  GFRAA 57* >60  --  >60  --  >60  ANIONGAP 8 9  --  9  --  6   < > = values in this interval not displayed.     Hematology Recent Labs  Lab 06/11/18 0505 06/11/18 1548 06/12/18 0333 06/13/18 0349  WBC 11.4*  --  10.0 10.9*  RBC 2.74*  --  3.27* 3.25*  HGB 7.3* 8.8* 8.8* 8.7*  HCT 22.8* 26.0* 27.3* 27.4*  MCV 83.2  --  83.5 84.3  MCH 26.6  --  26.9 26.8  MCHC 32.0  --  32.2 31.8  RDW 18.0*  --  18.1* 18.5*  PLT 363  --  307 295    Cardiac Enzymes Recent Labs  Lab 06/07/18 2041 06/08/18 0235 06/08/18 0834  TROPONINI <0.03 <0.03 <0.03    Recent Labs  Lab 06/07/18 1414  TROPIPOC 0.00     BNPNo results for input(s): BNP, PROBNP in the last 168 hours.   DDimer No results for input(s): DDIMER in the last 168 hours.   Radiology    No results found.  Cardiac Studies     Patient Profile     67 y.o. female with CAD, pseudoaneurysm  Assessment & Plan    1) CAD: Stable. Continue PLavix  2) s/p AVR: on IV heparin.  Waiting to start COumadin.  Hbg stable.   For questions or updates, please contact [facility] Please consult [email or web address] for contact info under        Signed, Lance Muss, MD  06/13/2018, 1:10 PM

## 2018-06-13 NOTE — Progress Notes (Signed)
Physical Therapy Treatment Patient Details Name: [name] MRN: [id number] DOB: 1951/01/03 Today's Date: 06/13/2018    History of Present Illness Pt is a 67 yo female admitted 06/07/18 with post cath groin concerns found to have pseudoaneurysm by ultrasound. 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 Pt with persistent bleeding from L groin incision s/p exploration of wound and drain placement 9/10. PHM includes CAD s/p CABG, HTN, NSTEMI, RA, severe anemia, AVR.   PT Comments    Pt progressing with mobility. Ambulatory at supervision-level. Limited by fatigue and c/o discomfort at L groin incision. Max education and encouragement on importance of continued ambulation throughout the day during hospital admission (with supervision from nursing staff). Continue to recommend HHPT services. Will follow acutely.   Follow Up Recommendations  Home health PT;Supervision - Intermittent     Equipment Recommendations  None recommended by PT(declined RW)    Recommendations for Other Services       Precautions / Restrictions Precautions Precautions: Fall Restrictions Weight Bearing Restrictions: No    Mobility  Bed Mobility Overal bed mobility: Modified Independent             General bed mobility comments: HOB up and increased time due to pain  Transfers Overall transfer level: Needs assistance Equipment used: None Transfers: Sit to/from Stand Sit to Stand: Supervision            Ambulation/Gait Ambulation/Gait assistance: Supervision Gait Distance (Feet): 150 Feet Assistive device: IV Pole Gait Pattern/deviations: Step-through pattern;Decreased stride length;Decreased weight shift to left;Trunk flexed;Antalgic Gait velocity: Decreased Gait velocity interpretation: 1.31 - 2.62 ft/sec, indicative of limited community ambulator General Gait Details: Declined use of RW to offload LLE. Pt wanting to hold onto IV pole for UE support despite encouragement to attempt  amb without it. Supervision, antalgic gait   Stairs             Wheelchair Mobility    Modified Rankin (Stroke Patients Only)       Balance Overall balance assessment: Needs assistance Sitting-balance support: No upper extremity supported;Feet supported Sitting balance-Leahy Scale: Good     Standing balance support: No upper extremity supported;During functional activity Standing balance-Leahy Scale: Fair                              Cognition Arousal/Alertness: Awake/alert Behavior During Therapy: Flat affect Overall Cognitive Status: Within Functional Limits for tasks assessed                                        Exercises      General Comments        Pertinent Vitals/Pain Pain Assessment: 0-10 Pain Score: 5  Pain Location: L groin incision Pain Descriptors / Indicators: Sore Pain Intervention(s): Monitored during session;Limited activity within patient's tolerance    Home Living                      Prior Function            PT Goals (current goals can now be found in the care plan section) Acute Rehab PT Goals Patient Stated Goal: "Keep my incisions from breaking open" PT Goal Formulation: With patient Time For Goal Achievement: 06/26/18 Potential to Achieve Goals: Good Progress towards PT goals: Progressing toward goals    Frequency    Min 3X/week  PT Plan Current plan remains appropriate    Co-evaluation              AM-PAC PT "6 Clicks" Daily Activity  Outcome Measure  Difficulty turning over in bed (including adjusting bedclothes, sheets and blankets)?: None Difficulty moving from lying on back to sitting on the side of the bed? : None Difficulty sitting down on and standing up from a chair with arms (e.g., wheelchair, bedside commode, etc,.)?: None Help needed moving to and from a bed to chair (including a wheelchair)?: A Little Help needed walking in hospital room?: A  Little Help needed climbing 3-5 steps with a railing? : A Little 6 Click Score: 21    End of Session   Activity Tolerance: Patient tolerated treatment well;Patient limited by fatigue Patient left: in bed;with call bell/phone within reach Nurse Communication: Mobility status PT Visit Diagnosis: Other abnormalities of gait and mobility (R26.89);Pain Pain - Right/Left: Left Pain - part of body: Leg     Time: 6440-3474 PT Time Calculation (min) (ACUTE ONLY): 12 min  Charges:  $Gait Training: 8-22 mins                     Ina Homes, PT, DPT Acute Rehabilitation Services  Pager [phone] Office [phone]  Malachy Chamber 06/13/2018, 12:34 PM

## 2018-06-13 NOTE — Progress Notes (Addendum)
ANTICOAGULATION CONSULT NOTE   Pharmacy Consult for heparin Indication: Mechanical AVR  Allergies  Allergen Reactions  . Contrast Media [Iodinated Diagnostic Agents] Hives, Itching and Swelling    Patient Measurements: Height: 5' 7.01" (170.2 cm) Weight: 167 lb 5.3 oz (75.9 kg) IBW/kg (Calculated) : 61.62 Heparin Dosing Weight: 70 kg  Vital Signs: Temp: 97.9 F (36.6 C) (09/12 0730) Temp Source: Oral (09/12 0400) BP: 116/51 (09/12 0730) Pulse Rate: 61 (09/12 0400)  Labs: Recent Labs    06/11/18 0505 06/11/18 1548 06/12/18 0333 06/12/18 0956 06/12/18 2041 06/13/18 0349  HGB 7.3* 8.8* 8.8*  --   --  8.7*  HCT 22.8* 26.0* 27.3*  --   --  27.4*  PLT 363  --  307  --   --  295  LABPROT 18.4*  --  16.5*  --   --  16.3*  INR 1.54  --  1.34  --   --  1.32  HEPARINUNFRC  --   --   --  0.24* 0.27* 0.38  CREATININE  --   --  0.94  --   --   --     Estimated Creatinine Clearance: 63.4 mL/min (by C-G formula based on SCr of 0.94 mg/dL).   Medical History: Past Medical History:  Diagnosis Date  . Ascending aortic aneurysm (HCC)    a.  redo replacement of fusiform aneurysm of ascending aorta (complicated by pericardial hematoma s/p evacuation) in 2014.  Marland Kitchen Contrast media allergy   . Coronary artery disease    a. s/p CABG x2 in 2014. b. s/p DES to SVG-LAD in 2017.  . H/O mechanical aortic valve replacement   . Hyperlipidemia   . Hypertension   . Ischemic cardiomyopathy    a. EF 40-45% in 2017 at time of NSTEMI, improved to normal in 2018.  . Non-STEMI (non-ST elevated myocardial infarction) (HCC)   . Premature atrial contractions   . PVC's (premature ventricular contractions)   . RA (rheumatoid arthritis) (HCC)   . S/P AVR (aortic valve replacement) 08/1999   St. Jude AVR for aortic insuff, Goal INR 2.5-3.5  . 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 Donata Clay) - complicated by pericardial  hematoma --> subxiphoid pericardial exposure and evacuation b. Cath 06/2016: DES to SVG-LAD ostium  . Sarcoid   . Second degree AV block    a. ? possibly seen in 12/2016 clinic visit - EKG not truly suggestive  . Severe anemia   . 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 s/p left subclavian bypass.    Medications:  Medications Prior to Admission  Medication Sig Dispense Refill Last Dose  . acetaminophen (TYLENOL) 500 MG tablet Take 500-1,000 mg by mouth every 6 (six) hours as needed (pain).   06/06/2018 at pm  . aspirin EC 81 MG EC tablet Take 1 tablet (81 mg total) by mouth daily. (Patient taking differently: Take 81 mg by mouth daily at 6 PM. )   06/06/2018 at pm  . atorvastatin (LIPITOR) 80 MG tablet TAKE 1 TABLET (80 MG TOTAL) BY MOUTH DAILY AT 6 PM. 90 tablet 2 06/06/2018 at pm  . Calcium Carb-Cholecalciferol (CALCIUM 600+D3 PO) Take 1 tablet by mouth daily.    06/06/2018 at am  . clopidogrel (PLAVIX) 75 MG tablet TAKE 1 TABLET BY MOUTH DAILY WITH BREAKFAST (Patient taking differently: Take 75 mg by mouth daily. ) 90 tablet 3  06/06/2018 at am  . Diclofenac Sodium 3 % GEL Place 1 application onto the skin 2 (two) times daily. To affected area. (Patient taking differently: Place 1 application onto the skin 2 (two) times daily as needed (arthritis pain). ) 100 g 0 2-3 weeks ago  . isosorbide mononitrate (IMDUR) 30 MG 24 hr tablet TAKE 1 TABLET BY MOUTH EVERY DAY (Patient taking differently: Take 30 mg by mouth daily. ) 30 tablet 6 06/06/2018 at am  . lisinopril (PRINIVIL,ZESTRIL) 2.5 MG tablet Take 1 tablet (2.5 mg total) by mouth daily. 90 tablet 4 06/06/2018 at am  . Multiple Vitamin (MULTIVITAMIN WITH MINERALS) TABS tablet Take 1 tablet by mouth daily.   06/06/2018 at am  . nitroGLYCERIN (NITROSTAT) 0.4 MG SL tablet PLACE 1 TABLET (0.4 MG TOTAL) UNDER THE TONGUE EVERY 5 (FIVE) MINUTES AS NEEDED FOR CHEST PAIN. 25 tablet 6 2-3 weeks agop  . ondansetron (ZOFRAN) 4 MG tablet Take 1 tablet (4 mg total) by  mouth every 6 (six) hours. (Patient taking differently: Take 2-4 mg by mouth daily. ) 12 tablet 0 06/06/2018 at am  . pantoprazole (PROTONIX) 40 MG tablet TAKE 1 TABLET BY MOUTH EVERY DAY (Patient taking differently: Take 40 mg by mouth daily. ) 30 tablet 6 06/06/2018 at am  . polyvinyl alcohol (ARTIFICIAL TEARS) 1.4 % ophthalmic solution Place 1-2 drops into both eyes daily as needed for dry eyes.   06/07/2018 at am  . warfarin (COUMADIN) 5 MG tablet TAKE 1/2 TO 1 TABLET BY MOUTH DAILY OR AS DIRECTED BY COUMADIN CLINIC (Patient taking differently: Take 2.5-5 mg by mouth See admin instructions. Take 1/2 tablet (2.5 mg) by mouth on Monday and Thursday at 6pm, take 1 tablet (5 mg) on Sunday, Tuesday, Wednesday, Friday, Saturday at 6pm) 90 tablet 0 06/06/2018 at 1800  . diazepam (VALIUM) 2 MG tablet Take 1 tablet (2 mg total) by mouth every 8 (eight) hours as needed for muscle spasms. (Patient not taking: Reported on 06/07/2018) 10 tablet 0 Not Taking at Unknown time  . enoxaparin (LOVENOX) 80 MG/0.8ML injection Inject 0.8 mLs (80 mg total) into the skin every 12 (twelve) hours for 7 doses. (Patient not taking: Reported on 06/07/2018) 7 Syringe 0 Not Taking at Unknown time  . fluticasone (FLONASE) 50 MCG/ACT nasal spray Place 1 spray into both nostrils daily. (Patient not taking: Reported on 06/07/2018) 16 g 2 Not Taking at Unknown time    Assessment:  82 YOF with a mechanical AVR on warfarin at home here with pseudoaneurysm near her PCI groin site now s/p repair with VVS 9/8. Repair complicated by significant bleeding and fall in hemoglobin. Pt now s/p re-exploration in OR with no source identified but hematoma washed out.  Heparin resume yesterday without complications, heparin level therapeutic. INR stable at 1.32 as expected. CBC stable, no S/Sx bleeding documented overnight. Will resume home regimen of warfarin and avoid acute loading.   Home warfarin dose = 2.5mg  Mon/Thurs; 5mg  all other bdays  Goal of Therapy:   INR 2-3 Heparin level 0.3-0.5 units/ml Monitor platelets by anticoagulation protocol: Yes   Plan:  -Warfarin 2.5mg  PO tonight - will resume home regimen -Continue IV heparin 850 units/hr -Recheck heparin level, CBC, INR in am  , PharmD, BCPS Clinical Pharmacist [phone] Please check AMION for all [facility] Pharmacy numbers 06/13/2018

## 2018-06-13 NOTE — Progress Notes (Addendum)
[facility]  Subjective  - Doing well no new complaints  Objective (!) 100/50 61 98 F (36.7 C) (Oral) 18 97%  Intake/Output Summary (Last 24 hours) at 06/13/2018 0737 Last data filed at 06/13/2018 0400 Gross per 24 hour  Intake 922.87 ml  Output 670 ml  Net 252.87 ml    Palpable left DP, thigh soft, JP in place with watery drainage JP out put 20 cc total  Heart Irregular Lungs non labored  Assessment/Planning: POD # 2 Exploration of left groin wound and placement of 19 Blake drain POD# 4 Repair of left femoral artery pseudoaneurysm  Post transfusion 2 units chronic anemia HGB 8.8 Heparin 850 units per hr continuous D/C drain today, ambulate  PT Eval and treatment Coumadin started yesterday INR 1.32    Mosetta Pigeon 06/13/2018 7:37 AM --  Laboratory Lab Results: Recent Labs    06/12/18 0333 06/13/18 0349  WBC 10.0 10.9*  HGB 8.8* 8.7*  HCT 27.3* 27.4*  PLT 307 295   BMET Recent Labs    06/11/18 1548 06/12/18 0333  NA 141 143  K 4.3 4.2  CL  --  111  CO2  --  26  GLUCOSE 99 113*  BUN  --  10  CREATININE  --  0.94  CALCIUM  --  8.4*    COAG Lab Results  Component Value Date   INR 1.32 06/13/2018   INR 1.34 06/12/2018   INR 1.54 06/11/2018     I have interviewed the patient and examined the patient. I agree with the findings by the PA. D/C JP. Resume PTx Go slow with Coumadin. She is on IV heparin bridge.  Cari Caraway, MD [phone]

## 2018-06-14 ENCOUNTER — Encounter (HOSPITAL_COMMUNITY): Payer: Self-pay | Admitting: Gastroenterology

## 2018-06-14 ENCOUNTER — Inpatient Hospital Stay (HOSPITAL_COMMUNITY): Payer: Commercial Managed Care - PPO

## 2018-06-14 LAB — COMPREHENSIVE METABOLIC PANEL
ALT: 51 U/L — ABNORMAL HIGH (ref 0–44)
AST: 22 U/L (ref 15–41)
Albumin: 2.4 g/dL — ABNORMAL LOW (ref 3.5–5.0)
Alkaline Phosphatase: 152 U/L — ABNORMAL HIGH (ref 38–126)
Anion gap: 8 (ref 5–15)
BILIRUBIN TOTAL: 1.1 mg/dL (ref 0.3–1.2)
BUN: 7 mg/dL — AB (ref 8–23)
CO2: 25 mmol/L (ref 22–32)
Calcium: 8.6 mg/dL — ABNORMAL LOW (ref 8.9–10.3)
Chloride: 108 mmol/L (ref 98–111)
Creatinine, Ser: 0.76 mg/dL (ref 0.44–1.00)
GFR calc Af Amer: >60 mL/min (ref >60)
Glucose, Bld: 153 mg/dL — ABNORMAL HIGH (ref 70–99)
POTASSIUM: 3.8 mmol/L (ref 3.5–5.1)
Sodium: 141 mmol/L (ref 135–145)
Total Protein: 5 g/dL — ABNORMAL LOW (ref 6.5–8.1)

## 2018-06-14 LAB — CBC
HEMATOCRIT: 27 % — AB (ref 36.0–46.0)
Hemoglobin: 8.5 g/dL — ABNORMAL LOW (ref 12.0–15.0)
MCH: 26.8 pg (ref 26.0–34.0)
MCHC: 31.5 g/dL (ref 30.0–36.0)
MCV: 85.2 fL (ref 78.0–100.0)
Platelets: 286 10*3/uL (ref 150–400)
RBC: 3.17 MIL/uL — AB (ref 3.87–5.11)
RDW: 18.6 % — ABNORMAL HIGH (ref 11.5–15.5)
WBC: 10.5 10*3/uL (ref 4.0–10.5)

## 2018-06-14 LAB — AMYLASE: Amylase: 73 U/L (ref 28–100)

## 2018-06-14 LAB — LIPASE, BLOOD: LIPASE: 31 U/L (ref 11–51)

## 2018-06-14 LAB — HEPARIN LEVEL (UNFRACTIONATED): Heparin Unfractionated: 0.3 IU/mL (ref 0.30–0.70)

## 2018-06-14 LAB — PROTIME-INR
INR: 1.44
PROTHROMBIN TIME: 17.4 s — AB (ref 11.4–15.2)

## 2018-06-14 MED ORDER — POLYETHYLENE GLYCOL 3350 17 G PO PACK: 17.0000 g | PACK | Freq: Every day | ORAL | Status: DC

## 2018-06-14 MED ORDER — WARFARIN SODIUM 5 MG PO TABS
5.0000 mg | ORAL_TABLET | Freq: Once | ORAL | Status: AC
  Administered 2018-06-14: 5 mg via ORAL
  Filled 2018-06-14: qty 1

## 2018-06-14 MED ORDER — POLYETHYLENE GLYCOL 3350 17 G PO PACK: 17.0000 g | PACK | Freq: Every day | ORAL | Status: DC | PRN

## 2018-06-14 MED ORDER — POLYETHYLENE GLYCOL 3350 17 G PO PACK
17.0000 g | PACK | Freq: Two times a day (BID) | ORAL | Status: DC
  Administered 2018-06-14 – 2018-06-17 (×2): 17 g via ORAL
  Filled 2018-06-14 (×5): qty 1

## 2018-06-14 MED ORDER — IOPAMIDOL (ISOVUE-300) INJECTION 61%
INTRAVENOUS | Status: AC
  Filled 2018-06-14: qty 30

## 2018-06-14 NOTE — Progress Notes (Signed)
ANTICOAGULATION CONSULT NOTE   Pharmacy Consult for heparin Indication: Mechanical AVR  Allergies  Allergen Reactions  . Contrast Media [Iodinated Diagnostic Agents] Hives, Itching and Swelling    Patient Measurements: Height: 5' 7.01" (170.2 cm) Weight: 169 lb 11.2 oz (77 kg)(Scale A) IBW/kg (Calculated) : 61.62 Heparin Dosing Weight: 70 kg  Vital Signs: Temp: 98 F (36.7 C) (09/13 0753) Temp Source: Oral (09/13 0753) BP: 102/82 (09/13 0753) Pulse Rate: 59 (09/13 0753)  Labs: Recent Labs    06/12/18 0333  06/12/18 2041 06/13/18 0349 06/14/18 0407  HGB 8.8*  --   --  8.7* 8.5*  HCT 27.3*  --   --  27.4* 27.0*  PLT 307  --   --  295 286  LABPROT 16.5*  --   --  16.3* 17.4*  INR 1.34  --   --  1.32 1.44  HEPARINUNFRC  --    < > 0.27* 0.38 0.30  CREATININE 0.94  --   --   --   --    < > = values in this interval not displayed.    Estimated Creatinine Clearance: 63.9 mL/min (by C-G formula based on SCr of 0.94 mg/dL).   Medical History: Past Medical History:  Diagnosis Date  . Ascending aortic aneurysm (HCC)    a.  redo replacement of fusiform aneurysm of ascending aorta (complicated by pericardial hematoma s/p evacuation) in 2014.  Marland Kitchen Contrast media allergy   . Coronary artery disease    a. s/p CABG x2 in 2014. b. s/p DES to SVG-LAD in 2017.  . H/O mechanical aortic valve replacement   . Hyperlipidemia   . Hypertension   . Ischemic cardiomyopathy    a. EF 40-45% in 2017 at time of NSTEMI, improved to normal in 2018.  . Non-STEMI (non-ST elevated myocardial infarction) (HCC)   . Premature atrial contractions   . PVC's (premature ventricular contractions)   . RA (rheumatoid arthritis) (HCC)   . S/P AVR (aortic valve replacement) 08/1999   St. Jude AVR for aortic insuff, Goal INR 2.5-3.5  . 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 Donata Clay) - complicated by pericardial hematoma  --> subxiphoid pericardial exposure and evacuation b. Cath 06/2016: DES to SVG-LAD ostium  . Sarcoid   . Second degree AV block    a. ? possibly seen in 12/2016 clinic visit - EKG not truly suggestive  . Severe anemia   . 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 s/p left subclavian bypass.    Medications:  Medications Prior to Admission  Medication Sig Dispense Refill Last Dose  . acetaminophen (TYLENOL) 500 MG tablet Take 500-1,000 mg by mouth every 6 (six) hours as needed (pain).   06/06/2018 at pm  . aspirin EC 81 MG EC tablet Take 1 tablet (81 mg total) by mouth daily. (Patient taking differently: Take 81 mg by mouth daily at 6 PM. )   06/06/2018 at pm  . atorvastatin (LIPITOR) 80 MG tablet TAKE 1 TABLET (80 MG TOTAL) BY MOUTH DAILY AT 6 PM. 90 tablet 2 06/06/2018 at pm  . Calcium Carb-Cholecalciferol (CALCIUM 600+D3 PO) Take 1 tablet by mouth daily.    06/06/2018 at am  . clopidogrel (PLAVIX) 75 MG tablet TAKE 1 TABLET BY MOUTH DAILY WITH BREAKFAST (Patient taking differently: Take 75 mg by mouth daily. ) 90 tablet 3 06/06/2018 at am  . Diclofenac Sodium  3 % GEL Place 1 application onto the skin 2 (two) times daily. To affected area. (Patient taking differently: Place 1 application onto the skin 2 (two) times daily as needed (arthritis pain). ) 100 g 0 2-3 weeks ago  . isosorbide mononitrate (IMDUR) 30 MG 24 hr tablet TAKE 1 TABLET BY MOUTH EVERY DAY (Patient taking differently: Take 30 mg by mouth daily. ) 30 tablet 6 06/06/2018 at am  . lisinopril (PRINIVIL,ZESTRIL) 2.5 MG tablet Take 1 tablet (2.5 mg total) by mouth daily. 90 tablet 4 06/06/2018 at am  . Multiple Vitamin (MULTIVITAMIN WITH MINERALS) TABS tablet Take 1 tablet by mouth daily.   06/06/2018 at am  . nitroGLYCERIN (NITROSTAT) 0.4 MG SL tablet PLACE 1 TABLET (0.4 MG TOTAL) UNDER THE TONGUE EVERY 5 (FIVE) MINUTES AS NEEDED FOR CHEST PAIN. 25 tablet 6 2-3 weeks agop  . ondansetron (ZOFRAN) 4 MG tablet Take 1 tablet (4 mg total) by mouth  every 6 (six) hours. (Patient taking differently: Take 2-4 mg by mouth daily. ) 12 tablet 0 06/06/2018 at am  . pantoprazole (PROTONIX) 40 MG tablet TAKE 1 TABLET BY MOUTH EVERY DAY (Patient taking differently: Take 40 mg by mouth daily. ) 30 tablet 6 06/06/2018 at am  . polyvinyl alcohol (ARTIFICIAL TEARS) 1.4 % ophthalmic solution Place 1-2 drops into both eyes daily as needed for dry eyes.   06/07/2018 at am  . warfarin (COUMADIN) 5 MG tablet TAKE 1/2 TO 1 TABLET BY MOUTH DAILY OR AS DIRECTED BY COUMADIN CLINIC (Patient taking differently: Take 2.5-5 mg by mouth See admin instructions. Take 1/2 tablet (2.5 mg) by mouth on Monday and Thursday at 6pm, take 1 tablet (5 mg) on Sunday, Tuesday, Wednesday, Friday, Saturday at 6pm) 90 tablet 0 06/06/2018 at 1800  . diazepam (VALIUM) 2 MG tablet Take 1 tablet (2 mg total) by mouth every 8 (eight) hours as needed for muscle spasms. (Patient not taking: Reported on 06/07/2018) 10 tablet 0 Not Taking at Unknown time  . enoxaparin (LOVENOX) 80 MG/0.8ML injection Inject 0.8 mLs (80 mg total) into the skin every 12 (twelve) hours for 7 doses. (Patient not taking: Reported on 06/07/2018) 7 Syringe 0 Not Taking at Unknown time  . fluticasone (FLONASE) 50 MCG/ACT nasal spray Place 1 spray into both nostrils daily. (Patient not taking: Reported on 06/07/2018) 16 g 2 Not Taking at Unknown time    Assessment:  69 YOF with a mechanical AVR on warfarin at home here with pseudoaneurysm near her PCI groin site now s/p repair with VVS 9/8. Repair complicated by significant bleeding and fall in hemoglobin. Pt now s/p re-exploration in OR with no source identified but hematoma washed out. Heparin and coumadin resumed 9/11 -heparin level= 0.3, INR= 1.44  Home warfarin dose = 2.5mg  Mon/Thurs; 5mg  all other bdays  Goal of Therapy:  INR 2-3 Heparin level 0.3-0.5 units/ml Monitor platelets by anticoagulation protocol: Yes   Plan:  -Warfarin 5mg  PO tonight -Continue IV heparin 850  units/hr -Recheck heparin level, CBC, INR in am  , PharmD Clinical Pharmacist Please check Amion for pharmacy contact number

## 2018-06-14 NOTE — Progress Notes (Addendum)
Progress Note  Patient Name: [name] Date of Encounter: 06/14/2018  Primary Cardiologist: Chrystie Nose, MD  Subjective   Had a rough night with L groin pain, also complaining of intermittent recurrence of epigastric pain and has not had BM since 9/8 despite colace and MOM  Inpatient Medications    Scheduled Meds: . sodium chloride   Intravenous Once  . sodium chloride   Intravenous Once  . atorvastatin  80 mg Oral q1800  . clopidogrel  75 mg Oral Q breakfast  . docusate sodium  100 mg Oral Daily  . famotidine  20 mg Oral Daily  . pantoprazole  40 mg Oral Daily  . Warfarin - Pharmacist Dosing Inpatient   Does not apply q1800   Continuous Infusions: . sodium chloride 50 mL/hr at 06/13/18 1054  . heparin 850 Units/hr (06/14/18 0300)  . lactated ringers 10 mL/hr at 06/11/18 1431   PRN Meds: acetaminophen, alum & mag hydroxide-simeth, magnesium hydroxide, morphine injection, nitroGLYCERIN, ondansetron (ZOFRAN) IV, oxyCODONE-acetaminophen   Vital Signs    Vitals:   06/14/18 0003 06/14/18 0412 06/14/18 0427 06/14/18 0753  BP: (!) 113/44  120/65 102/82  Pulse: 75  81 (!) 59  Resp: 12  19 18   Temp: 98.9 F (37.2 C)  98.3 F (36.8 C) 98 F (36.7 C)  TempSrc: Oral  Oral Oral  SpO2: 100%  100% 99%  Weight:  77 kg    Height:        Intake/Output Summary (Last 24 hours) at 06/14/2018 1145 Last data filed at 06/14/2018 0830 Gross per 24 hour  Intake 1348 ml  Output 1000 ml  Net 348 ml   Filed Weights   06/12/18 0300 06/13/18 0400 06/14/18 0412  Weight: 74.4 kg 75.9 kg 77 kg    Telemetry    NSR occ PACs/PVCs - Personally Reviewed  Physical Exam   GEN: No acute distress.  HEENT: Normocephalic, atraumatic, sclera non-icteric. Neck: No JVD or bruits. Cardiac: RRR 2/6 SEM at LSB with crisp valve click. No rubs or gallops.  Radials/DP/PT 1+ and equal bilaterally.  Respiratory: Clear to auscultation bilaterally. Breathing is unlabored. GI: Soft, nontender,  non-distended, BS +x 4. MS: no deformity. Extremities: No clubbing or cyanosis. No edema. Distal pedal pulses are 2+ and equal bilaterally. Moderate swelling L groin, remains tender, no acute bleeding. Neuro:  AAOx3. Follows commands. Psych:  Responds to questions appropriately with a normal affect.  Labs    Chemistry Recent Labs  Lab 06/07/18 1406 06/08/18 0834  06/10/18 0016 06/11/18 1548 06/12/18 0333  NA 140 138   < > 140 141 143  K 4.1 4.0   < > 4.5 4.3 4.2  CL 107 105  --  108  --  111  CO2 25 24  --  23  --  26  GLUCOSE 107* 131*  --  139* 99 113*  BUN 15 11  --  10  --  10  CREATININE 1.14* 1.00  --  0.97  --  0.94  CALCIUM 9.0 8.8*  --  8.8*  --  8.4*  PROT 6.4*  --   --   --   --  4.8*  ALBUMIN 3.2*  --   --   --   --  2.3*  AST 34  --   --   --   --  53*  ALT 32  --   --   --   --  98*  ALKPHOS 123  --   --   --   --  193*  BILITOT 1.2  --   --   --   --  1.0  GFRNONAA 49* 58*  --  60*  --  >60  GFRAA 57* >60  --  >60  --  >60  ANIONGAP 8 9  --  9  --  6   < > = values in this interval not displayed.     Hematology Recent Labs  Lab 06/12/18 0333 06/13/18 0349 06/14/18 0407  WBC 10.0 10.9* 10.5  RBC 3.27* 3.25* 3.17*  HGB 8.8* 8.7* 8.5*  HCT 27.3* 27.4* 27.0*  MCV 83.5 84.3 85.2  MCH 26.9 26.8 26.8  MCHC 32.2 31.8 31.5  RDW 18.1* 18.5* 18.6*  PLT 307 295 286    Cardiac Enzymes Recent Labs  Lab 06/07/18 2041 06/08/18 0235 06/08/18 0834  TROPONINI <0.03 <0.03 <0.03    Recent Labs  Lab 06/07/18 1414  TROPIPOC 0.00    Radiology    No results found.  Patient Profile     67 y.o. female with aortic insufficiency s/p St. Jude AVR 2000, subclavian artery stenosis s/p bypass 2001, CAD s/p CABGx2 (SVG-LAD and SVG-Cx) in 2014 with redo replacement of fusiform aneurysm of ascending aorta (complicated by pericardial hematoma s/p evacuation), NSTEMI 2017 with ICM 40-45% and cath resulting in PCI of SVG-LAD, EF normalized in 2018, PACs/PVCs, RA,  sarcoidosis, dyslipidemia, contrast allergy (hives). She was recently seen in ER with chest pain/elevated troponin. She initially refused admission that day but agreed to return the following day. F/u troponin was negative. Cath showed restenosis of the stent of the ostium of the SVG to the LAD and the left circumflex artery. She returned for PCI of LM on 05/21/18 with plan to continue Coumadin+ASA+Plavix x 1 month then stopping the aspirin. She was seen in the office on 06/07/18 with post cath groin concerns found to have pseudoaneurysm by ultrasound. She went to the OR with VVS 9/8 and back on 9/10.  Assessment & Plan    1. Pseudoaneurysm, left groin, post-cath site - vascular on board, to OR 9/8 and 9/10 - she has been cleared to resume anticoag (now on heparin->Coumadin) - home when INR therapeutic   2. Blood loss anemia - vascular surgery gave 2U PRBC this admission - Hgb relatively stable, need to follow closely  3. Mechanical aortic valve - on heparin - Coumadin resumed 06/12/18  4. CAD s/p prior CABG, PCI, and most recent further PCI 05/2018 - was on triple therapy: ASA, plavix, and coumadin - continue plavix only for now - will discuss with attending about ASA resumption - the plan was for this to have discontinued 9/20 anyway  5. Epigastric pain, left upper abdominal pain, constipation with elevated lipase/amlyase - per notes, looks like GI was consulted but they had difficulty accessing the patient as she was in OR. See curbside note 9/10, pancreatitis suspected. Lipase initially improved but now with more symptoms so will call GI for formal consult (also need assistance with constipation in setting of abd pain) - relayed consult to [facility] - recheck LFTs to trend, lipase, amylase  6. PVCs/PACs -asymptomatic  - last lytes wnl  7. Hematuria reported earlier this admission - no further hematuria - will follow as we add back coumadin  For questions or updates, please  contact [facility] Please consult [email or web address] for contact info under Cardiology/STEMI.  Signed, Laurann Montana, PA-C 06/14/2018, 11:45 AM    I have examined the patient and reviewed assessment and plan and discussed  with patient.  Agree with above as stated.  Recheck amylase lipase which were elevated,but then decreased suddenly.  SHe is able to eat, so this abdominal pain may be more related to constipation.   Lance Muss

## 2018-06-14 NOTE — Consult Note (Signed)
Reason for Consult:Abdominal pain and constipation Referring Physician: cardiology  [name] is an 67 y.o. female.  HPI: patient seen and examined and hospital computer chart reviewed and she has not seen a gastroenterologist before and other than a little reflux at home has not had any other GI problems and her family history is negative from a GI standpoint she did not know she had gallstones and she did have a bout of pancreatitis earlier in the hospital stay but it's hard to know if that is medicine-induced or from her procedure or hypotension ordered from her gallstonesand her liver tests were not a elevated currently she is doing better and I think she is just hurting over her left lower quadrant hematoma and not her upper abdomen and she is on pain medicines and has not had a bowel movement in a while and does feel constipated will use some stool softeners at home and has no other complaints  Past Medical History:  Diagnosis Date  . Ascending aortic aneurysm (HCC)    a.  redo replacement of fusiform aneurysm of ascending aorta (complicated by pericardial hematoma s/p evacuation) in 2014.  Marland Kitchen Contrast media allergy   . Coronary artery disease    a. s/p CABG x2 in 2014. b. s/p DES to SVG-LAD in 2017.  . H/O mechanical aortic valve replacement   . Hyperlipidemia   . Hypertension   . Ischemic cardiomyopathy    a. EF 40-45% in 2017 at time of NSTEMI, improved to normal in 2018.  . Non-STEMI (non-ST elevated myocardial infarction) ([place])   . Premature atrial contractions   . PVC's (premature ventricular contractions)   . RA (rheumatoid arthritis) ([place])   . S/P AVR (aortic valve replacement) 08/1999   St. Jude AVR for aortic insuff, Goal INR 2.5-3.5  . 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 Prescott Gum) - complicated by pericardial hematoma --> subxiphoid pericardial exposure and evacuation b. Cath 06/2016:  DES to SVG-LAD ostium  . Sarcoid   . Second degree AV block    a. ? possibly seen in 12/2016 clinic visit - EKG not truly suggestive  . Severe anemia   . Subclavian artery stenosis ([place]) 06/2000   a. s/p left subclavian bypass.    Past Surgical History:  Procedure Laterality Date  . AORTIC VALVE REPLACEMENT  08/23/1999   71m St. Jude, [place] procedure, root replacemtn   . BYPASS GRAFT ANGIOGRAPHY  05/20/2018   Procedure: BYPASS GRAFT ANGIOGRAPHY;  Surgeon: VJettie Booze MD;  [facility];  Service: Cardiovascular;;  . CARDIAC CATHETERIZATION  08/1999   normal L main, LAD & first diagonal are normal, normal Cfx, RCA with mild ostial narrowing, normal LV systolic function, severe AI, mild MR, normal PAP (Dr. ALoni Muse Little)   . CARDIAC CATHETERIZATION N/A 06/22/2016   Procedure: Left Heart Cath and Cors/Grafts Angiography;  Surgeon: DLeonie Man MD;  [facility];  Service: Cardiovascular;  Laterality: N/A;  . CARDIAC CATHETERIZATION N/A 06/23/2016   Procedure: Left Heart Cath and Coronary Angiography;  Surgeon: MSherren Mocha MD;  [facility];  Service: Cardiovascular;  Laterality: N/A;  . CARDIAC CATHETERIZATION N/A 06/23/2016   Procedure: Coronary Stent Intervention;  Surgeon: MSherren Mocha MD;  [facility];  Service: Cardiovascular;  Laterality: N/A;  . CORONARY ARTERY BYPASS GRAFT  11/05/2012   Procedure: CORONARY ARTERY  BYPASS GRAFTING (CABG);  Surgeon: Ivin Poot, MD;  Location: [facility];  Service: Open Heart Surgery;  Laterality: N/A;  cannulate right subclavian  . CORONARY STENT INTERVENTION N/A 05/21/2018   Procedure: CORONARY STENT INTERVENTION;  Surgeon: Jettie Booze, MD;  Location: [facility];  Service: Cardiovascular;  Laterality: N/A;  . ENDOVEIN HARVEST OF GREATER SAPHENOUS VEIN  11/05/2012   Procedure: ENDOVEIN HARVEST OF GREATER SAPHENOUS VEIN;  Surgeon: Ivin Poot, MD;  Location: [facility];  Service: Open Heart Surgery;  Laterality: Right;  . FEMORAL-FEMORAL BYPASS GRAFT Left 06/09/2018   Procedure: REPAIR OF LEFT FEMORAL ARTERY PSEUDO ANEURYSM;  Surgeon: Angelia Mould, MD;  Location: [facility];  Service: Vascular;  Laterality: Left;  . GROIN DEBRIDEMENT Left 06/11/2018   Procedure: EXPLORATION LEFT GROIN FOR BLEEDING;  Surgeon: Angelia Mould, MD;  Location: [facility];  Service: Vascular;  Laterality: Left;  . IABP INSERTION N/A 05/21/2018   Procedure: IABP INSERTION;  Surgeon: Jettie Booze, MD;  Location: [facility];  Service: Cardiovascular;  Laterality: N/A;  . INTRAOPERATIVE TRANSESOPHAGEAL ECHOCARDIOGRAM  11/05/2012   Procedure: INTRAOPERATIVE TRANSESOPHAGEAL ECHOCARDIOGRAM;  Surgeon: Ivin Poot, MD;  Location: [place];  Service: Open Heart Surgery;  Laterality: N/A;  . INTRAOPERATIVE TRANSESOPHAGEAL ECHOCARDIOGRAM N/A 11/21/2012   Procedure: INTRAOPERATIVE TRANSESOPHAGEAL ECHOCARDIOGRAM;  Surgeon: Ivin Poot, MD;  Location: [place];  Service: Open Heart Surgery;  Laterality: N/A;  . LEFT HEART CATHETERIZATION WITH CORONARY ANGIOGRAM N/A 11/04/2012   Procedure: LEFT HEART CATHETERIZATION WITH CORONARY ANGIOGRAM;  Surgeon: Pixie Casino, MD;  Location: [facility];  Service: Cardiovascular;  Laterality: N/A;  . MEDIASTINOSCOPY  08/2005   bronchoscopy & mediastinoscopy for mediastinal adenopathy (Dr. Prescott Gum)   . NM MYOCAR PERF WALL MOTION  01/2012   lexiscan; small, fixed apical septal breast attenuation artifact & inferolateral bowel attenuation, no reversible ischemia, post-stress EF 64%, low risk scan   . PERICARDIAL WINDOW N/A 11/21/2012   Procedure: PERICARDIAL WINDOW;  Surgeon: Ivin Poot, MD;  Location: [place];  Service: Thoracic;  Laterality: N/A;  . REPLACEMENT ASCENDING AORTA  11/05/2012   Procedure: REPLACEMENT ASCENDING AORTA;  Surgeon: Ivin Poot, MD;  Location: [facility];  Service: Open Heart Surgery;  Laterality: N/A;  .  SUBCLAVIAN ANGIOGRAM  05/10/2000   left subclavian arteriogram; patent graft, mild stenosis (Dr. Delton See)   . SUBCLAVIAN BYPASS GRAFT  05/16/1999   left subclavian bypass with 82m Darcon graft (Dr. GDelton See  . TRANSTHORACIC ECHOCARDIOGRAM  11/20/2012   EF 65-70%, mild conc LVH, grade 2 diastolic dysfunction, mech AV, mild MR    Family History  Problem Relation Age of Onset  . Hypertension Mother   . Colon cancer Neg Hx   . Esophageal cancer Neg Hx   . Pancreatic cancer Neg Hx   . Stomach cancer Neg Hx   . Liver disease Neg Hx     Social History:  reports that she has never smoked. She has never used smokeless tobacco. She reports that she does not drink alcohol or use drugs.  Allergies:  Allergies  Allergen Reactions  . Contrast Media [Iodinated Diagnostic Agents] Hives, Itching and Swelling    Medications: I have reviewed the patient's current medications.  Results for orders placed or performed during the hospital encounter of 06/07/18 (from the past 48 hour(s))  Heparin level (unfractionated)     Status: Abnormal   Collection Time: 06/12/18  8:41 PM  Result Value  Ref Range   Heparin Unfractionated 0.27 (L) 0.30 - 0.70 IU/mL    Comment: (NOTE) If heparin results are below expected values, and patient dosage has  been confirmed, suggest follow up testing of antithrombin III levels. Performed at [facility], [place] [address] [place], [place] [address]   CBC     Status: Abnormal   Collection Time: 06/13/18  3:49 AM  Result Value Ref Range   WBC 10.9 (H) 4.0 - 10.5 K/uL   RBC 3.25 (L) 3.87 - 5.11 MIL/uL   Hemoglobin 8.7 (L) 12.0 - 15.0 g/dL   HCT 27.4 (L) 36.0 - 46.0 %   MCV 84.3 78.0 - 100.0 fL   MCH 26.8 26.0 - 34.0 pg   MCHC 31.8 30.0 - 36.0 g/dL   RDW 18.5 (H) 11.5 - 15.5 %   Platelets 295 150 - 400 K/uL    Comment: Performed at [facility], [place] [address], [place], [place] [address]  Heparin level (unfractionated)     Status: None   Collection Time:  06/13/18  3:49 AM  Result Value Ref Range   Heparin Unfractionated 0.38 0.30 - 0.70 IU/mL    Comment: (NOTE) If heparin results are below expected values, and patient dosage has  been confirmed, suggest follow up testing of antithrombin III levels. Performed at [facility], [place] [address], [place], [place] [address]   Protime-INR     Status: Abnormal   Collection Time: 06/13/18  3:49 AM  Result Value Ref Range   Prothrombin Time 16.3 (H) 11.4 - 15.2 seconds   INR 1.32     Comment: Performed at [facility] [address], [place], [place] [address]  Protime-INR     Status: Abnormal   Collection Time: 06/14/18  4:07 AM  Result Value Ref Range   Prothrombin Time 17.4 (H) 11.4 - 15.2 seconds   INR 1.44     Comment: Performed at [facility] [address] [place], [place] [address]  CBC     Status: Abnormal   Collection Time: 06/14/18  4:07 AM  Result Value Ref Range   WBC 10.5 4.0 - 10.5 K/uL   RBC 3.17 (L) 3.87 - 5.11 MIL/uL   Hemoglobin 8.5 (L) 12.0 - 15.0 g/dL   HCT 27.0 (L) 36.0 - 46.0 %   MCV 85.2 78.0 - 100.0 fL   MCH 26.8 26.0 - 34.0 pg   MCHC 31.5 30.0 - 36.0 g/dL   RDW 18.6 (H) 11.5 - 15.5 %   Platelets 286 150 - 400 K/uL    Comment: Performed at [facility], [place] [address], [place], [place] [address]  Heparin level (unfractionated)     Status: None   Collection Time: 06/14/18  4:07 AM  Result Value Ref Range   Heparin Unfractionated 0.30 0.30 - 0.70 IU/mL    Comment: (NOTE) If heparin results are below expected values, and patient dosage has  been confirmed, suggest follow up testing of antithrombin III levels. Performed at [facility], [place] [address] [place], [place] [address]   Lipase, blood     Status: None   Collection Time: 06/14/18  1:19 PM  Result Value Ref Range   Lipase 31 11 - 51 U/L    Comment: Performed at [facility] [address] [place], [place] [address]  Amylase     Status: None   Collection  Time: 06/14/18  1:19 PM  Result Value Ref Range   Amylase 73 28 - 100 U/L    Comment: Performed at [facility] [address] [place], [place] [address]  Comprehensive metabolic panel     Status: Abnormal   Collection Time: 06/14/18  1:19 PM  Result Value Ref Range   Sodium 141 135 - 145 mmol/L   Potassium 3.8 3.5 - 5.1 mmol/L   Chloride 108 98 - 111 mmol/L   CO2 25 22 - 32 mmol/L   Glucose, Bld 153 (H) 70 - 99 mg/dL   BUN 7 (L) 8 - 23 mg/dL   Creatinine, Ser 0.76 0.44 - 1.00 mg/dL   Calcium 8.6 (L) 8.9 - 10.3 mg/dL   Total Protein 5.0 (L) 6.5 - 8.1 g/dL   Albumin 2.4 (L) 3.5 - 5.0 g/dL   AST 22 15 - 41 U/L   ALT 51 (H) 0 - 44 U/L   Alkaline Phosphatase 152 (H) 38 - 126 U/L   Total Bilirubin 1.1 0.3 - 1.2 mg/dL   GFR calc non Af Amer >60 >60 mL/min   GFR calc Af Amer >60 >60 mL/min    Comment: (NOTE) The eGFR has been calculated using the CKD EPI equation. This calculation has not been validated in all clinical situations. eGFR's persistently <60 mL/min signify possible Chronic Kidney Disease.    Anion gap 8 5 - 15    Comment: Performed at [facility] [address] [place], [place] [address]    No results found.  ROSpertinent for her hematoma pain as above Blood pressure (!) 137/114, pulse 66, temperature 97.9 F (36.6 C), temperature source Oral, resp. rate 18, height 5' 7.01" (1.702 m), weight 77 kg, SpO2 100 %. Physical Examvital signs stable afebrile no acute distress her pain seems to be in the left lower quadrant over her hematoma compared elsewhere on her abdomen ultrasound shows gallstones labs reviewed lipase back to normal liver tests normalwhite count okay hemoglobin stable  Assessment/Plan: Multiple medical problems including probable resolved pancreatitis and probable constipation due to narcotics and decreased activity Plan: Will get a noncontrast CT chest to better evaluate the pancreas and she probably needs her gallbladder out at some  point and would recommend and Intra-Op cholangiogram since she is not a candidate for an MRCP based on her valve and will increase her MiraLAX to twice a day and still a bowel movement and could increase at further if needed and will last my partner to check on this weekend and happy to see back as an outpatient when necessary  [facility] 06/14/2018, 3:22 PM

## 2018-06-14 NOTE — Progress Notes (Signed)
Physical Therapy Treatment & Discharge Patient Details Name: [name] MRN: [id number] DOB: Jul 02, 1951 Today's Date: 06/14/2018    History of Present Illness Pt is a 67 yo female admitted 06/07/18 with post cath groin concerns found to have pseudoaneurysm by ultrasound. 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 Pt with persistent bleeding from L groin incision s/p exploration of wound and drain placement 9/10. PHM includes CAD s/p CABG, HTN, NSTEMI, RA, severe anemia, AVR.   PT Comments    Pt progressing with mobility. Mod indep ambulating while pushing IV pole; slow, antalgic gait, limited by c/o LLE incision soreness; pt not interested in gait training without UE support, but also declining SPC/RW for home use. Pt also declining stair training; educ on technique with LLE sore. Encouraged pt to continue ambulating more often during hospital admission. Has met short-term acute PT goals. Will d/c acute PT.   Follow Up Recommendations  Home health PT;Supervision - Intermittent     Equipment Recommendations  None recommended by PT(declined RW)    Recommendations for Other Services       Precautions / Restrictions Precautions Precautions: Fall Restrictions Weight Bearing Restrictions: No    Mobility  Bed Mobility               General bed mobility comments: Received sitting in chair  Transfers Overall transfer level: Independent Equipment used: 1 person hand held assist Transfers: Sit to/from Stand Sit to Stand: Supervision            Ambulation/Gait Ambulation/Gait assistance: Modified independent (Device/Increase time)   Assistive device: IV Pole Gait Pattern/deviations: Step-through pattern;Decreased stride length;Decreased weight shift to left;Trunk flexed;Antalgic Gait velocity: Decreased Gait velocity interpretation: 1.31 - 2.62 ft/sec, indicative of limited community ambulator General Gait Details: Pt not interested in ambulating without IV  pole   Stairs Stairs: (Pt declined stair training; educ on technique with sore LLE)           Wheelchair Mobility    Modified Rankin (Stroke Patients Only)       Balance Overall balance assessment: Needs assistance Sitting-balance support: No upper extremity supported;Feet supported Sitting balance-Leahy Scale: Good     Standing balance support: No upper extremity supported;During functional activity Standing balance-Leahy Scale: Fair                              Cognition Arousal/Alertness: Awake/alert Behavior During Therapy: WFL for tasks assessed/performed Overall Cognitive Status: Within Functional Limits for tasks assessed                                        Exercises      General Comments        Pertinent Vitals/Pain Pain Assessment: Faces Faces Pain Scale: Hurts little more Pain Location: L groin incision, headache Pain Descriptors / Indicators: Sore;Headache Pain Intervention(s): Monitored during session;Patient requesting pain meds-RN notified    Home Living                      Prior Function            PT Goals (current goals can now be found in the care plan section) Acute Rehab PT Goals Patient Stated Goal: "Keep my incisions from breaking open" PT Goal Formulation: All assessment and education complete, DC therapy Progress towards PT goals: Goals met/education completed,  patient discharged from PT    Frequency    Min 3X/week      PT Plan Current plan remains appropriate    Co-evaluation              AM-PAC PT "6 Clicks" Daily Activity  Outcome Measure  Difficulty turning over in bed (including adjusting bedclothes, sheets and blankets)?: None Difficulty moving from lying on back to sitting on the side of the bed? : None Difficulty sitting down on and standing up from a chair with arms (e.g., wheelchair, bedside commode, etc,.)?: None Help needed moving to and from a bed to chair  (including a wheelchair)?: None Help needed walking in hospital room?: None Help needed climbing 3-5 steps with a railing? : A Little 6 Click Score: 23    End of Session Equipment Utilized During Treatment: Gait belt Activity Tolerance: Patient tolerated treatment well;Patient limited by pain Patient left: in chair;with call bell/phone within reach Nurse Communication: Mobility status PT Visit Diagnosis: Other abnormalities of gait and mobility (R26.89);Pain Pain - Right/Left: Left Pain - part of body: Leg     Time: 9242-6834 PT Time Calculation (min) (ACUTE ONLY): 22 min  Charges:  $Gait Training: 8-22 mins                     Mabeline Caras, PT, DPT Acute Rehabilitation Services  Pager [phone] Office [place] 06/14/2018, 1:50 PM

## 2018-06-14 NOTE — Progress Notes (Addendum)
   VASCULAR SURGERY ASSESSMENT & PLAN:   STATUS POST REPAIR OF LEFT FEMORAL ARTERY PSEUDOANEURYSM: Her drain is out.  She can start her Coumadin.  She is on IV heparin.  She is status post aortic valve replacement.  Home when Coumadin therapeutic.   SUBJECTIVE:   Some incisional discomfort.  PHYSICAL EXAM:   Vitals:   06/13/18 1944 06/14/18 0003 06/14/18 0412 06/14/18 0427  BP: 104/75 (!) 113/44  120/65  Pulse: 83 75  81  Resp: 14 12  19   Temp: 98 F (36.7 C) 98.9 F (37.2 C)  98.3 F (36.8 C)  TempSrc: Oral Oral  Oral  SpO2: 99% 100%  100%  Weight:   77 kg   Height:       Moderate swelling left groin.  No bleeding from drain site. Palpable left dorsalis pedis pulse.  LABS:   Lab Results  Component Value Date   WBC 10.5 06/14/2018   HGB 8.5 (L) 06/14/2018   HCT 27.0 (L) 06/14/2018   MCV 85.2 06/14/2018   PLT 286 06/14/2018   Lab Results  Component Value Date   CREATININE 0.94 06/12/2018   Lab Results  Component Value Date   INR 1.44 06/14/2018    PROBLEM LIST:    Active Problems:   Pseudoaneurysm (HCC)   CURRENT MEDS:   . sodium chloride   Intravenous Once  . sodium chloride   Intravenous Once  . atorvastatin  80 mg Oral q1800  . clopidogrel  75 mg Oral Q breakfast  . docusate sodium  100 mg Oral Daily  . famotidine  20 mg Oral Daily  . pantoprazole  40 mg Oral Daily  . Warfarin - Pharmacist Dosing Inpatient   Does not apply q1800    06/16/2018 Beeper: [name] Office: [phone] 06/14/2018

## 2018-06-14 NOTE — Progress Notes (Signed)
Occupational Therapy Treatment Patient Details Name: [name] MRN: [id number] DOB: 1951-06-23 Today's Date: 06/14/2018    History of present illness Pt is a 67 yo female admitted 06/07/18 with post cath groin concerns found to have pseudoaneurysm by ultrasound. 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 Pt with persistent bleeding from L groin incision s/p exploration of wound and drain placement 9/10. PHM includes CAD s/p CABG, HTN, NSTEMI, RA, severe anemia, AVR.   OT comments  Pt performed toileting and standing grooming with supervision. Continues to have some incisional pain interfering with LB ADL. Will continue to follow.  Follow Up Recommendations  No OT follow up    Equipment Recommendations  None recommended by OT    Recommendations for Other Services      Precautions / Restrictions Precautions Precautions: Fall       Mobility Bed Mobility               General bed mobility comments: pt in chair  Transfers Overall transfer level: Needs assistance Equipment used: 1 person hand held assist Transfers: Sit to/from Stand Sit to Stand: Supervision              Balance Overall balance assessment: Needs assistance   Sitting balance-Leahy Scale: Good       Standing balance-Leahy Scale: Fair                             ADL either performed or assessed with clinical judgement   ADL Overall ADL's : Needs assistance/impaired     Grooming: Wash/dry hands;Standing;Supervision/safety                   Toilet Transfer: Supervision/safety;BSC;RW;Ambulation   Toileting- Architect and Hygiene: Supervision/safety;Sit to/from stand               Vision       Perception     Praxis      Cognition Arousal/Alertness: Awake/alert Behavior During Therapy: WFL for tasks assessed/performed Overall Cognitive Status: Within Functional Limits for tasks assessed                                           Exercises     Shoulder Instructions       General Comments      Pertinent Vitals/ Pain       Pain Assessment: Faces Faces Pain Scale: Hurts little more Pain Location: L groin incision Pain Descriptors / Indicators: Sore Pain Intervention(s): Monitored during session;Repositioned  Home Living                                          Prior Functioning/Environment              Frequency  Min 2X/week        Progress Toward Goals  OT Goals(current goals can now be found in the care plan section)  Progress towards OT goals: Progressing toward goals  Acute Rehab OT Goals Patient Stated Goal: "Keep my incisions from breaking open" OT Goal Formulation: With patient Time For Goal Achievement: 06/24/18 Potential to Achieve Goals: Good  Plan Discharge plan remains appropriate    Co-evaluation  AM-PAC PT "6 Clicks" Daily Activity     Outcome Measure   Help from another person eating meals?: None Help from another person taking care of personal grooming?: A Little Help from another person toileting, which includes using toliet, bedpan, or urinal?: A Little Help from another person bathing (including washing, rinsing, drying)?: A Little Help from another person to put on and taking off regular upper body clothing?: None Help from another person to put on and taking off regular lower body clothing?: A Little 6 Click Score: 20    End of Session    OT Visit Diagnosis: Pain   Activity Tolerance Patient tolerated treatment well   Patient Left in chair;with call bell/phone within reach   Nurse Communication          Time: 1204-1224 OT Time Calculation (min): 20 min  Charges: OT General Charges $OT Visit: 1 Visit OT Treatments $Self Care/Home Management : 8-22 mins  Martie Round, OTR/L Acute Rehabilitation Services Pager: [phone] Office: [phone]   Evern Bio 06/14/2018, 1:01  PM

## 2018-06-15 DIAGNOSIS — I25708 Atherosclerosis of coronary artery bypass graft(s), unspecified, with other forms of angina pectoris: Secondary | ICD-10-CM

## 2018-06-15 DIAGNOSIS — D500 Iron deficiency anemia secondary to blood loss (chronic): Secondary | ICD-10-CM

## 2018-06-15 DIAGNOSIS — Z955 Presence of coronary angioplasty implant and graft: Secondary | ICD-10-CM

## 2018-06-15 DIAGNOSIS — R1013 Epigastric pain: Secondary | ICD-10-CM

## 2018-06-15 LAB — CBC
HCT: 25.7 % — ABNORMAL LOW (ref 36.0–46.0)
Hemoglobin: 8.2 g/dL — ABNORMAL LOW (ref 12.0–15.0)
MCH: 26.9 pg (ref 26.0–34.0)
MCHC: 31.9 g/dL (ref 30.0–36.0)
MCV: 84.3 fL (ref 78.0–100.0)
Platelets: 265 K/uL (ref 150–400)
RBC: 3.05 MIL/uL — ABNORMAL LOW (ref 3.87–5.11)
RDW: 18.3 % — ABNORMAL HIGH (ref 11.5–15.5)
WBC: 9.3 K/uL (ref 4.0–10.5)

## 2018-06-15 LAB — HEPARIN LEVEL (UNFRACTIONATED)
Heparin Unfractionated: 0.26 IU/mL — ABNORMAL LOW (ref 0.30–0.70)
Heparin Unfractionated: 0.24 [IU]/mL — ABNORMAL LOW (ref 0.30–0.70)
Heparin Unfractionated: 0.32 IU/mL (ref 0.30–0.70)

## 2018-06-15 LAB — COMPREHENSIVE METABOLIC PANEL
ALK PHOS: 135 U/L — AB (ref 38–126)
ALT: 40 U/L (ref 0–44)
ANION GAP: 6 (ref 5–15)
AST: 18 U/L (ref 15–41)
Albumin: 2.1 g/dL — ABNORMAL LOW (ref 3.5–5.0)
BILIRUBIN TOTAL: 1 mg/dL (ref 0.3–1.2)
BUN: 10 mg/dL (ref 8–23)
CHLORIDE: 108 mmol/L (ref 98–111)
CO2: 26 mmol/L (ref 22–32)
CREATININE: 0.73 mg/dL (ref 0.44–1.00)
Calcium: 8.2 mg/dL — ABNORMAL LOW (ref 8.9–10.3)
GFR calc non Af Amer: >60 mL/min (ref >60)
Glucose, Bld: 91 mg/dL (ref 70–99)
Potassium: 3.7 mmol/L (ref 3.5–5.1)
Sodium: 140 mmol/L (ref 135–145)
Total Protein: 4.4 g/dL — ABNORMAL LOW (ref 6.5–8.1)

## 2018-06-15 LAB — PROTIME-INR
INR: 1.72
Prothrombin Time: 20 s — ABNORMAL HIGH (ref 11.4–15.2)

## 2018-06-15 MED ORDER — WARFARIN SODIUM 5 MG PO TABS
5.0000 mg | ORAL_TABLET | Freq: Once | ORAL | Status: AC
  Administered 2018-06-15: 5 mg via ORAL
  Filled 2018-06-15: qty 1

## 2018-06-15 NOTE — Progress Notes (Signed)
Subjective: Abdominal pain some improved.  Objective: Vital signs in last 24 hours: Temp:  [97.9 F (36.6 C)-98.4 F (36.9 C)] 98.2 F (36.8 C) (09/13 2354) Pulse Rate:  [58-74] 58 (09/13 2354) Resp:  [13-22] 22 (09/14 0638) BP: (101-137)/(36-114) 101/47 (09/13 2354) SpO2:  [98 %-100 %] 100 % (09/13 2354) Weight:  [78 kg] 78 kg (09/14 0638) Weight change: 1.025 kg Last BM Date: 06/14/18  PE: GEN:  NAD ABD:  Abdominal tenderness, mild-to-moderate  Lab Results: CBC    Component Value Date/Time   WBC 9.3 06/15/2018 0417   RBC 3.05 (L) 06/15/2018 0417   HGB 8.2 (L) 06/15/2018 0417   HCT 25.7 (L) 06/15/2018 0417   PLT 265 06/15/2018 0417   MCV 84.3 06/15/2018 0417   MCH 26.9 06/15/2018 0417   MCHC 31.9 06/15/2018 0417   RDW 18.3 (H) 06/15/2018 0417   LYMPHSABS 0.5 (L) 06/07/2018 2041   MONOABS 0.6 06/07/2018 2041   EOSABS 0.1 06/07/2018 2041   BASOSABS 0.0 06/07/2018 2041   CMP     Component Value Date/Time   NA 140 06/15/2018 0417   K 3.7 06/15/2018 0417   CL 108 06/15/2018 0417   CO2 26 06/15/2018 0417   GLUCOSE 91 06/15/2018 0417   BUN 10 06/15/2018 0417   CREATININE 0.73 06/15/2018 0417   CREATININE 0.77 07/14/2015 1615   CALCIUM 8.2 (L) 06/15/2018 0417   PROT 4.4 (L) 06/15/2018 0417   ALBUMIN 2.1 (L) 06/15/2018 0417   AST 18 06/15/2018 0417   ALT 40 06/15/2018 0417   ALKPHOS 135 (H) 06/15/2018 0417   BILITOT 1.0 06/15/2018 0417   GFRNONAA >60 06/15/2018 0417   GFRAA >60 06/15/2018 0417   Assessment:  1.  Femoral artery pseudoaneurysm repair. 2.  Abdominal pain:  Persists despite treatment of constipation. 3.  Pancreatitis, CT- and lab-proven.  Unclear etiology:  Trace elevation of LFTs, has gallstones. 4.  Constipation, resolved with Miralax.  Plan:  1. Deescalate diet, as patient is having ongoing pain. 2. Minimize narcotics as feasible. 3. Surgical consultation in a day or two, pending improvement of her pancreatitis, for consideration of  cholecystectomy + IOC (not MRCP candidate) and defer timing of possible cholecystectomy to surgical team. 4.  [facility] will follow.   [name] 06/15/2018, 10:59 AM   Cell [phone] If no answer or after 5 PM call [phone]

## 2018-06-15 NOTE — Progress Notes (Signed)
ANTICOAGULATION CONSULT NOTE   Pharmacy Consult for heparin Indication: Mechanical AVR  Allergies  Allergen Reactions  . Contrast Media [Iodinated Diagnostic Agents] Hives, Itching and Swelling    Patient Measurements: Height: 5' 7.01" (170.2 cm) Weight: 171 lb 15.3 oz (78 kg) IBW/kg (Calculated) : 61.62 Heparin Dosing Weight: 70 kg  Vital Signs: Temp: 98.2 F (36.8 C) (09/13 2354) Temp Source: Oral (09/13 2354) BP: 101/47 (09/13 2354) Pulse Rate: 58 (09/13 2354)  Labs: Recent Labs    06/13/18 0349 06/14/18 0407 06/14/18 1319 06/15/18 0417  HGB 8.7* 8.5*  --  8.2*  HCT 27.4* 27.0*  --  25.7*  PLT 295 286  --  265  LABPROT 16.3* 17.4*  --  20.0*  INR 1.32 1.44  --  1.72  HEPARINUNFRC 0.38 0.30  --  0.26*  CREATININE  --   --  0.76 0.73    Estimated Creatinine Clearance: 75.5 mL/min (by C-G formula based on SCr of 0.73 mg/dL).   Medical History: Past Medical History:  Diagnosis Date  . Ascending aortic aneurysm (HCC)    a.  redo replacement of fusiform aneurysm of ascending aorta (complicated by pericardial hematoma s/p evacuation) in 2014.  Marland Kitchen Contrast media allergy   . Coronary artery disease    a. s/p CABG x2 in 2014. b. s/p DES to SVG-LAD in 2017.  . H/O mechanical aortic valve replacement   . Hyperlipidemia   . Hypertension   . Ischemic cardiomyopathy    a. EF 40-45% in 2017 at time of NSTEMI, improved to normal in 2018.  . Non-STEMI (non-ST elevated myocardial infarction) (HCC)   . Premature atrial contractions   . PVC's (premature ventricular contractions)   . RA (rheumatoid arthritis) (HCC)   . S/P AVR (aortic valve replacement) 08/1999   St. Jude AVR for aortic insuff, Goal INR 2.5-3.5  . 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 Donata Clay) - complicated by pericardial hematoma --> subxiphoid pericardial exposure and evacuation b. Cath 06/2016: DES to SVG-LAD ostium  .  Sarcoid   . Second degree AV block    a. ? possibly seen in 12/2016 clinic visit - EKG not truly suggestive  . Severe anemia   . 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 s/p left subclavian bypass.    Medications:  Medications Prior to Admission  Medication Sig Dispense Refill Last Dose  . acetaminophen (TYLENOL) 500 MG tablet Take 500-1,000 mg by mouth every 6 (six) hours as needed (pain).   06/06/2018 at pm  . aspirin EC 81 MG EC tablet Take 1 tablet (81 mg total) by mouth daily. (Patient taking differently: Take 81 mg by mouth daily at 6 PM. )   06/06/2018 at pm  . atorvastatin (LIPITOR) 80 MG tablet TAKE 1 TABLET (80 MG TOTAL) BY MOUTH DAILY AT 6 PM. 90 tablet 2 06/06/2018 at pm  . Calcium Carb-Cholecalciferol (CALCIUM 600+D3 PO) Take 1 tablet by mouth daily.    06/06/2018 at am  . clopidogrel (PLAVIX) 75 MG tablet TAKE 1 TABLET BY MOUTH DAILY WITH BREAKFAST (Patient taking differently: Take 75 mg by mouth daily. ) 90 tablet 3 06/06/2018 at am  . Diclofenac Sodium 3 % GEL Place 1 application onto the skin 2 (two) times daily. To affected area. (Patient taking differently: Place 1 application onto the skin 2 (two) times daily as needed (arthritis pain). ) 100 g 0  2-3 weeks ago  . isosorbide mononitrate (IMDUR) 30 MG 24 hr tablet TAKE 1 TABLET BY MOUTH EVERY DAY (Patient taking differently: Take 30 mg by mouth daily. ) 30 tablet 6 06/06/2018 at am  . lisinopril (PRINIVIL,ZESTRIL) 2.5 MG tablet Take 1 tablet (2.5 mg total) by mouth daily. 90 tablet 4 06/06/2018 at am  . Multiple Vitamin (MULTIVITAMIN WITH MINERALS) TABS tablet Take 1 tablet by mouth daily.   06/06/2018 at am  . nitroGLYCERIN (NITROSTAT) 0.4 MG SL tablet PLACE 1 TABLET (0.4 MG TOTAL) UNDER THE TONGUE EVERY 5 (FIVE) MINUTES AS NEEDED FOR CHEST PAIN. 25 tablet 6 2-3 weeks agop  . ondansetron (ZOFRAN) 4 MG tablet Take 1 tablet (4 mg total) by mouth every 6 (six) hours. (Patient taking differently: Take 2-4 mg by mouth daily. ) 12 tablet 0  06/06/2018 at am  . pantoprazole (PROTONIX) 40 MG tablet TAKE 1 TABLET BY MOUTH EVERY DAY (Patient taking differently: Take 40 mg by mouth daily. ) 30 tablet 6 06/06/2018 at am  . polyvinyl alcohol (ARTIFICIAL TEARS) 1.4 % ophthalmic solution Place 1-2 drops into both eyes daily as needed for dry eyes.   06/07/2018 at am  . warfarin (COUMADIN) 5 MG tablet TAKE 1/2 TO 1 TABLET BY MOUTH DAILY OR AS DIRECTED BY COUMADIN CLINIC (Patient taking differently: Take 2.5-5 mg by mouth See admin instructions. Take 1/2 tablet (2.5 mg) by mouth on Monday and Thursday at 6pm, take 1 tablet (5 mg) on Sunday, Tuesday, Wednesday, Friday, Saturday at 6pm) 90 tablet 0 06/06/2018 at 1800  . diazepam (VALIUM) 2 MG tablet Take 1 tablet (2 mg total) by mouth every 8 (eight) hours as needed for muscle spasms. (Patient not taking: Reported on 06/07/2018) 10 tablet 0 Not Taking at Unknown time  . enoxaparin (LOVENOX) 80 MG/0.8ML injection Inject 0.8 mLs (80 mg total) into the skin every 12 (twelve) hours for 7 doses. (Patient not taking: Reported on 06/07/2018) 7 Syringe 0 Not Taking at Unknown time  . fluticasone (FLONASE) 50 MCG/ACT nasal spray Place 1 spray into both nostrils daily. (Patient not taking: Reported on 06/07/2018) 16 g 2 Not Taking at Unknown time    Assessment:  68 YOF with a mechanical AVR on warfarin at home here with pseudoaneurysm near her PCI groin site now s/p repair with VVS 9/8. Repair complicated by significant bleeding and fall in hemoglobin. Pt now s/p re-exploration in OR with no source identified but hematoma washed out. Heparin and coumadin resumed 9/11 Heparin level is subtherapeutic this morning at 0.26. INR up to 1.72 today, but still subtherapeutic. H/H are stable with no s/sx of bleeding noted.  Home warfarin dose = 2.5mg  Mon/Thurs; 5mg  all other bdays  Goal of Therapy:  INR 2-3 Heparin level 0.3-0.5 units/ml Monitor platelets by anticoagulation protocol: Yes   Plan:  -Increase heparin slightly to  900 units/hr -Recheck heparin level at 1400 -Warfarin 5mg  PO tonight -Recheck CBC, INR in am -Monitor for s/sx of bleeding  , PharmD PGY1 Pharmacy Resident Phone [phone] 06/15/2018     7:21 AM

## 2018-06-15 NOTE — Progress Notes (Signed)
Progress Note  Patient Name: [name] Date of Encounter: 06/15/2018  Primary Cardiologist: Chrystie Nose, MD   Subjective   She continues to have some abdominal discomfort, but less so than yesterday.  She denies chest pain, palpitations, leg swelling, and shortness of breath.  Inpatient Medications    Scheduled Meds: . sodium chloride   Intravenous Once  . sodium chloride   Intravenous Once  . atorvastatin  80 mg Oral q1800  . clopidogrel  75 mg Oral Q breakfast  . docusate sodium  100 mg Oral Daily  . famotidine  20 mg Oral Daily  . pantoprazole  40 mg Oral Daily  . polyethylene glycol  17 g Oral BID  . warfarin  5 mg Oral ONCE-1800  . Warfarin - Pharmacist Dosing Inpatient   Does not apply q1800   Continuous Infusions: . sodium chloride 50 mL/hr at 06/15/18 0805  . heparin 900 Units/hr (06/15/18 0807)  . lactated ringers 10 mL/hr at 06/11/18 1431   PRN Meds: acetaminophen, alum & mag hydroxide-simeth, magnesium hydroxide, morphine injection, nitroGLYCERIN, ondansetron (ZOFRAN) IV, oxyCODONE-acetaminophen   Vital Signs    Vitals:   06/14/18 2050 06/14/18 2304 06/14/18 2354 06/15/18 0638  BP: (!) 118/36 (!) 130/46 (!) 101/47   Pulse: 60 74 (!) 58   Resp: 20 15 13  (!) 22  Temp: 98 F (36.7 C) 98.4 F (36.9 C) 98.2 F (36.8 C)   TempSrc: Oral Oral Oral   SpO2: 99% 98% 100%   Weight:    78 kg  Height:        Intake/Output Summary (Last 24 hours) at 06/15/2018 1057 Last data filed at 06/15/2018 0639 Gross per 24 hour  Intake 1240 ml  Output 600 ml  Net 640 ml   Filed Weights   06/13/18 0400 06/14/18 0412 06/15/18 0638  Weight: 75.9 kg 77 kg 78 kg    Telemetry    Sinus rhythm with frequent PACs and isolated PVCs- Personally Reviewed  ECG    No new tracings- Personally Reviewed  Physical Exam   GEN: No acute distress.   Neck: No JVD Cardiac: RRR, prosthetic click appreciated, 2/6 ejection systolic murmur best heard at right upper sternal  border, rubs, or gallops.  Respiratory: Clear to auscultation bilaterally. GI: Soft, mild tenderness, mildly distended MS: No edema; No deformity. Neuro:  Nonfocal  Psych: Normal affect   Labs    Chemistry Recent Labs  Lab 06/12/18 0333 06/14/18 1319 06/15/18 0417  NA 143 141 140  K 4.2 3.8 3.7  CL 111 108 108  CO2 26 25 26   GLUCOSE 113* 153* 91  BUN 10 7* 10  CREATININE 0.94 0.76 0.73  CALCIUM 8.4* 8.6* 8.2*  PROT 4.8* 5.0* 4.4*  ALBUMIN 2.3* 2.4* 2.1*  AST 53* 22 18  ALT 98* 51* 40  ALKPHOS 193* 152* 135*  BILITOT 1.0 1.1 1.0  GFRNONAA >60 >60 >60  GFRAA >60 >60 >60  ANIONGAP 6 8 6      Hematology Recent Labs  Lab 06/13/18 0349 06/14/18 0407 06/15/18 0417  WBC 10.9* 10.5 9.3  RBC 3.25* 3.17* 3.05*  HGB 8.7* 8.5* 8.2*  HCT 27.4* 27.0* 25.7*  MCV 84.3 85.2 84.3  MCH 26.8 26.8 26.9  MCHC 31.8 31.5 31.9  RDW 18.5* 18.6* 18.3*  PLT 295 286 265    Cardiac EnzymesNo results for input(s): TROPONINI in the last 168 hours. No results for input(s): TROPIPOC in the last 168 hours.   BNPNo results for input(s):  BNP, PROBNP in the last 168 hours.   DDimer No results for input(s): DDIMER in the last 168 hours.   Radiology    Ct Abdomen Pelvis Wo Contrast  Result Date: 06/15/2018 CLINICAL DATA:  Acute pancreatitis. First episode. Unknown etiology. Prior left groin debridement for bleeding 4 days ago. Femoral femoral bypass graft 6 days ago. EXAM: CT ABDOMEN AND PELVIS WITHOUT CONTRAST TECHNIQUE: Multidetector CT imaging of the abdomen and pelvis was performed following the standard protocol without IV contrast. COMPARISON:  Plain films 06/09/2018. Abdominal ultrasound 06/09/2018. PET 07/21/2005 FINDINGS: Lower chest: Clear lung bases. Mild cardiomegaly, without pericardial or pleural effusion. Tortuous descending thoracic aorta. Prior median sternotomy. Hepatobiliary: Normal liver. Multiple dependent small gallstones. No evidence of acute cholecystitis. No biliary duct  dilatation. Pancreas: Possible edema within the pancreatic head and neck, including on image 36/3. Is most apparent when compared to the prior PET of 07/21/2005. No pancreatic duct dilatation or peripancreatic fluid collection. Spleen: Normal in size, without focal abnormality. Adrenals/Urinary Tract: Normal adrenal glands. Right extrarenal pelvis. No renal calculi or hydronephrosis. An upper pole left renal low-density lesion of 2.9 cm is likely a cyst. No hydroureter or ureteric calculi. No bladder calculi. Stomach/Bowel: Normal stomach, without wall thickening. Colonic stool burden suggests constipation. Scattered colonic diverticula. Normal terminal ileum. Normal small bowel. Vascular/Lymphatic: Aortic and branch vessel atherosclerosis. No abdominopelvic adenopathy. Reproductive: Normal uterus and adnexa. Other: No significant free fluid. Extensive edema centered about the left groin with ill-defined hyperattenuating material/hemorrhage and minimal gas. Musculoskeletal: Lumbosacral spondylosis. Minimal motion degradation in the upper abdomen with mild artifact from EKG wire and lead. IMPRESSION: 1. Mild degradation secondary to motion, minimal artifact, and lack of IV contrast. 2. Subtle pancreatic head and neck edema suspected, especially given the clinical history. Limited evaluation for choledocholithiasis or pancreas divisum secondary to technique. 3. Cholelithiasis. 4.  Possible constipation. 5. Hemorrhage and small volume gas in the left groin, consistent with the clinical history. 6.  Possible constipation.   Aortic Atherosclerosis (ICD10-I70.0). Electronically Signed   By: Jeronimo Greaves M.D.   On: 06/15/2018 00:05    Cardiac Studies   None this admission  Patient Profile     67 y.o. female with aortic insufficiency s/p St. Jude AVR 2000, subclavian artery stenosis s/p bypass 2001, CAD s/p CABGx2 (SVG-LAD and SVG-Cx) in 2014 with redo replacement of fusiform aneurysm of ascending aorta  (complicated by pericardial hematoma s/p evacuation), NSTEMI 2017 with ICM 40-45% and cath resulting in PCI of SVG-LAD, EF normalized in 2018, PACs/PVCs, RA, sarcoidosis, dyslipidemia, contrast allergy (hives). She was recently seen in ER with chest pain/elevated troponin. She initially refused admission that day but agreed to return the following day. F/u troponin was negative. Cath showed restenosis of the stent of the ostium of the SVG to the LAD and the left circumflex artery. She returned for PCI of LM on 05/21/18 with plan to continue Coumadin+ASA+Plavix x 1 month then stopping the aspirin. She was seen in the office on 06/07/18 with post cath groin concerns found to havepseudoaneurysmby ultrasound.She went to the OR with VVS 9/8 and back on 9/10.  Assessment & Plan    1. Pseudoaneurysm, left groin, post-cath site - vascular on board, to OR 9/8 and 9/10 - she has been cleared to resume anticoag (now on heparin->Coumadin) - home when INR therapeutic   2. Blood loss anemia - vascular surgery gave 2U PRBC this admission - Hgb relatively stable, need to follow closely. 8.2 today.  3. Mechanical aortic  valve - on heparin - Coumadin resumed 06/12/18, INR 1.72 today. Plan for 5 mg warfarin tonight as per pharmacy.  4. CAD s/p prior CABG, PCI, and most recent further PCI 05/2018 - was on triple therapy: ASA, plavix, and coumadin - continue plavix only for now as she is on IV heparin and warfarin  5. Epigastric pain, left upper abdominal pain, constipation with elevated lipase/amlyase -Normal amylase and lipase.  Abdominal CT reviewed above.  Evaluated by gastroenterology.  They have recommended surgical consultation and a day or 2 pending improvement of her pancreatitis for consideration of cholecystectomy.  6. PVCs/PACs -asymptomatic  - last lytes wnl  7. Hematuria reported earlier this admission - no further hematuria - will follow as we add back coumadin  For questions or  updates, please contact [facility] Please consult [email or web address] for contact info under Cardiology/STEMI.      Signed, Prentice Docker, MD  06/15/2018, 10:57 AM

## 2018-06-15 NOTE — Progress Notes (Addendum)
[facility]  Subjective  - No evidence of recurrent bleeding left groin or drain exit site.  Abdominal pain being worked up possible gallbladder.   Objective (!) 101/47 (!) 58 98.2 F (36.8 C) (Oral) (!) 22 100%  Intake/Output Summary (Last 24 hours) at 06/15/2018 0743 Last data filed at 06/15/2018 0639 Gross per 24 hour  Intake 1480 ml  Output 800 ml  Net 680 ml    Left thigh with minimal edema, dry dressing over drain exit site. Independent ambulation in her room  Assessment/Planning: POD #4Exploration of left groin wound and placement of 19 Blake drain POD# 6Repair of left femoral artery pseudoaneurysm  Pending INR therapeutic prior to discharge and abdominal work up. INR 1.72, HGB 8.2 2 units transfused 06/11/2018  Mosetta Pigeon 06/15/2018 7:43 AM --  Laboratory Lab Results: Recent Labs    06/14/18 0407 06/15/18 0417  WBC 10.5 9.3  HGB 8.5* 8.2*  HCT 27.0* 25.7*  PLT 286 265   BMET Recent Labs    06/14/18 1319 06/15/18 0417  NA 141 140  K 3.8 3.7  CL 108 108  CO2 25 26  GLUCOSE 153* 91  BUN 7* 10  CREATININE 0.76 0.73  CALCIUM 8.6* 8.2*    COAG Lab Results  Component Value Date   INR 1.72 06/15/2018   INR 1.44 06/14/2018   INR 1.32 06/13/2018   No results found for: PTT    I have examined the patient, reviewed and agree with above.  Gretta Began, MD 06/15/2018 10:17 AM

## 2018-06-15 NOTE — Progress Notes (Signed)
ANTICOAGULATION CONSULT NOTE   Pharmacy Consult for heparin Indication: Mechanical AVR  Allergies  Allergen Reactions  . Contrast Media [Iodinated Diagnostic Agents] Hives, Itching and Swelling    Patient Measurements: Height: 5' 7.01" (170.2 cm) Weight: 171 lb 15.3 oz (78 kg) IBW/kg (Calculated) : 61.62 Heparin Dosing Weight: 70 kg  Vital Signs: BP: 99/42 (09/14 1544) Pulse Rate: 67 (09/14 1544)  Labs: Recent Labs    06/13/18 0349 06/14/18 0407 06/14/18 1319 06/15/18 0417 06/15/18 1415  HGB 8.7* 8.5*  --  8.2*  --   HCT 27.4* 27.0*  --  25.7*  --   PLT 295 286  --  265  --   LABPROT 16.3* 17.4*  --  20.0*  --   INR 1.32 1.44  --  1.72  --   HEPARINUNFRC 0.38 0.30  --  0.26* 0.24*  CREATININE  --   --  0.76 0.73  --     Estimated Creatinine Clearance: 75.5 mL/min (by C-G formula based on SCr of 0.73 mg/dL).   Medical History: Past Medical History:  Diagnosis Date  . Ascending aortic aneurysm (HCC)    a.  redo replacement of fusiform aneurysm of ascending aorta (complicated by pericardial hematoma s/p evacuation) in 2014.  Marland Kitchen Contrast media allergy   . Coronary artery disease    a. s/p CABG x2 in 2014. b. s/p DES to SVG-LAD in 2017.  . H/O mechanical aortic valve replacement   . Hyperlipidemia   . Hypertension   . Ischemic cardiomyopathy    a. EF 40-45% in 2017 at time of NSTEMI, improved to normal in 2018.  . Non-STEMI (non-ST elevated myocardial infarction) (HCC)   . Premature atrial contractions   . PVC's (premature ventricular contractions)   . RA (rheumatoid arthritis) (HCC)   . S/P AVR (aortic valve replacement) 08/1999   [employer] Jude AVR for aortic insuff, Goal INR 2.5-3.5  . 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 Donata Clay) - complicated by pericardial hematoma --> subxiphoid pericardial exposure and evacuation b. Cath 06/2016: DES to SVG-LAD ostium  . Sarcoid   . Second  degree AV block    a. ? possibly seen in 12/2016 clinic visit - EKG not truly suggestive  . Severe anemia   . 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 s/p left subclavian bypass.    Medications:  Medications Prior to Admission  Medication Sig Dispense Refill Last Dose  . acetaminophen (TYLENOL) 500 MG tablet Take 500-1,000 mg by mouth every 6 (six) hours as needed (pain).   06/06/2018 at pm  . aspirin EC 81 MG EC tablet Take 1 tablet (81 mg total) by mouth daily. (Patient taking differently: Take 81 mg by mouth daily at 6 PM. )   06/06/2018 at pm  . atorvastatin (LIPITOR) 80 MG tablet TAKE 1 TABLET (80 MG TOTAL) BY MOUTH DAILY AT 6 PM. 90 tablet 2 06/06/2018 at pm  . Calcium Carb-Cholecalciferol (CALCIUM 600+D3 PO) Take 1 tablet by mouth daily.    06/06/2018 at am  . clopidogrel (PLAVIX) 75 MG tablet TAKE 1 TABLET BY MOUTH DAILY WITH BREAKFAST (Patient taking differently: Take 75 mg by mouth daily. ) 90 tablet 3 06/06/2018 at am  . Diclofenac Sodium 3 % GEL Place 1 application onto the skin 2 (two) times daily. To affected area. (Patient taking differently: Place 1 application onto the skin 2 (two) times  daily as needed (arthritis pain). ) 100 g 0 2-3 weeks ago  . isosorbide mononitrate (IMDUR) 30 MG 24 hr tablet TAKE 1 TABLET BY MOUTH EVERY DAY (Patient taking differently: Take 30 mg by mouth daily. ) 30 tablet 6 06/06/2018 at am  . lisinopril (PRINIVIL,ZESTRIL) 2.5 MG tablet Take 1 tablet (2.5 mg total) by mouth daily. 90 tablet 4 06/06/2018 at am  . Multiple Vitamin (MULTIVITAMIN WITH MINERALS) TABS tablet Take 1 tablet by mouth daily.   06/06/2018 at am  . nitroGLYCERIN (NITROSTAT) 0.4 MG SL tablet PLACE 1 TABLET (0.4 MG TOTAL) UNDER THE TONGUE EVERY 5 (FIVE) MINUTES AS NEEDED FOR CHEST PAIN. 25 tablet 6 2-3 weeks agop  . ondansetron (ZOFRAN) 4 MG tablet Take 1 tablet (4 mg total) by mouth every 6 (six) hours. (Patient taking differently: Take 2-4 mg by mouth daily. ) 12 tablet 0 06/06/2018 at am  .  pantoprazole (PROTONIX) 40 MG tablet TAKE 1 TABLET BY MOUTH EVERY DAY (Patient taking differently: Take 40 mg by mouth daily. ) 30 tablet 6 06/06/2018 at am  . polyvinyl alcohol (ARTIFICIAL TEARS) 1.4 % ophthalmic solution Place 1-2 drops into both eyes daily as needed for dry eyes.   06/07/2018 at am  . warfarin (COUMADIN) 5 MG tablet TAKE 1/2 TO 1 TABLET BY MOUTH DAILY OR AS DIRECTED BY COUMADIN CLINIC (Patient taking differently: Take 2.5-5 mg by mouth See admin instructions. Take 1/2 tablet (2.5 mg) by mouth on Monday and Thursday at 6pm, take 1 tablet (5 mg) on Sunday, Tuesday, Wednesday, Friday, Saturday at 6pm) 90 tablet 0 06/06/2018 at 1800  . diazepam (VALIUM) 2 MG tablet Take 1 tablet (2 mg total) by mouth every 8 (eight) hours as needed for muscle spasms. (Patient not taking: Reported on 06/07/2018) 10 tablet 0 Not Taking at Unknown time  . enoxaparin (LOVENOX) 80 MG/0.8ML injection Inject 0.8 mLs (80 mg total) into the skin every 12 (twelve) hours for 7 doses. (Patient not taking: Reported on 06/07/2018) 7 Syringe 0 Not Taking at Unknown time  . fluticasone (FLONASE) 50 MCG/ACT nasal spray Place 1 spray into both nostrils daily. 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 Repair complicated by significant bleeding and fall in hemoglobin. Pt now s/p re-exploration in OR with no source identified but hematoma washed out. Heparin and coumadin resumed 9/11.  Heparin level this afternoon remains subtherapeutic at 0.24, on 900 units/hr. Hgb 8.2, plt 265. No s/sx of bleeding. No infusion issues.   Home warfarin dose = 2.5mg  Mon/Thurs; 5mg  all other bdays  Goal of Therapy:  INR 2-3 Heparin level 0.3-0.5 units/ml Monitor platelets by anticoagulation protocol: Yes   Plan:  -Increase heparin slightly to 950 units/hr -Recheck  heparin level in 6 hr -Warfarin 5mg  PO tonight -Recheck CBC, INR in am -Monitor for s/sx of bleeding  , PharmD Clinical Pharmacist  Pager: [phone] Phone: [phone] 06/15/2018     4:12 PM

## 2018-06-16 DIAGNOSIS — R319 Hematuria, unspecified: Secondary | ICD-10-CM

## 2018-06-16 DIAGNOSIS — I491 Atrial premature depolarization: Secondary | ICD-10-CM

## 2018-06-16 DIAGNOSIS — I493 Ventricular premature depolarization: Secondary | ICD-10-CM

## 2018-06-16 DIAGNOSIS — R109 Unspecified abdominal pain: Secondary | ICD-10-CM

## 2018-06-16 LAB — CBC
HCT: 27.2 % — ABNORMAL LOW (ref 36.0–46.0)
Hemoglobin: 8.5 g/dL — ABNORMAL LOW (ref 12.0–15.0)
MCH: 26.6 pg (ref 26.0–34.0)
MCHC: 31.3 g/dL (ref 30.0–36.0)
MCV: 85.3 fL (ref 78.0–100.0)
PLATELETS: 274 10*3/uL (ref 150–400)
RBC: 3.19 MIL/uL — ABNORMAL LOW (ref 3.87–5.11)
RDW: 18.5 % — AB (ref 11.5–15.5)
WBC: 8 10*3/uL (ref 4.0–10.5)

## 2018-06-16 LAB — PROTIME-INR
INR: 1.78
PROTHROMBIN TIME: 20.6 s — AB (ref 11.4–15.2)

## 2018-06-16 LAB — HEPARIN LEVEL (UNFRACTIONATED): HEPARIN UNFRACTIONATED: 0.43 [IU]/mL (ref 0.30–0.70)

## 2018-06-16 MED ORDER — WARFARIN SODIUM 3 MG PO TABS
6.0000 mg | ORAL_TABLET | Freq: Once | ORAL | Status: DC
  Filled 2018-06-16: qty 2

## 2018-06-16 MED ORDER — WARFARIN SODIUM 3 MG PO TABS
6.0000 mg | ORAL_TABLET | Freq: Once | ORAL | Status: AC
  Administered 2018-06-16: 6 mg via ORAL
  Filled 2018-06-16: qty 2

## 2018-06-16 NOTE — Progress Notes (Signed)
ANTICOAGULATION CONSULT NOTE   Pharmacy Consult for heparin Indication: Mechanical AVR  Allergies  Allergen Reactions  . Contrast Media [Iodinated Diagnostic Agents] Hives, Itching and Swelling    Patient Measurements: Height: 5' 7.01" (170.2 cm) Weight: 172 lb 6.4 oz (78.2 kg) IBW/kg (Calculated) : 61.62 Heparin Dosing Weight: 70 kg  Vital Signs: Temp: 97.6 F (36.4 C) (09/15 0456) Temp Source: Oral (09/15 0456) BP: 143/48 (09/15 0456) Pulse Rate: 63 (09/15 0456)  Labs: Recent Labs    06/14/18 0407 06/14/18 1319 06/15/18 0417 06/15/18 1415 06/15/18 2301 06/16/18 0501  HGB 8.5*  --  8.2*  --   --  8.5*  HCT 27.0*  --  25.7*  --   --  27.2*  PLT 286  --  265  --   --  274  LABPROT 17.4*  --  20.0*  --   --  20.6*  INR 1.44  --  1.72  --   --  1.78  HEPARINUNFRC 0.30  --  0.26* 0.24* 0.32 0.43  CREATININE  --  0.76 0.73  --   --   --     Estimated Creatinine Clearance: 75.5 mL/min (by C-G formula based on SCr of 0.73 mg/dL).   Medical History: Past Medical History:  Diagnosis Date  . Ascending aortic aneurysm (HCC)    a.  redo replacement of fusiform aneurysm of ascending aorta (complicated by pericardial hematoma s/p evacuation) in 2014.  Marland Kitchen Contrast media allergy   . Coronary artery disease    a. s/p CABG x2 in 2014. b. s/p DES to SVG-LAD in 2017.  . H/O mechanical aortic valve replacement   . Hyperlipidemia   . Hypertension   . Ischemic cardiomyopathy    a. EF 40-45% in 2017 at time of NSTEMI, improved to normal in 2018.  . Non-STEMI (non-ST elevated myocardial infarction) (HCC)   . Premature atrial contractions   . PVC's (premature ventricular contractions)   . RA (rheumatoid arthritis) (HCC)   . S/P AVR (aortic valve replacement) 08/1999   St[employer] Jude AVR for aortic insuff, Goal INR 2.5-3.5  . 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 Donata Clay) - complicated by pericardial  hematoma --> subxiphoid pericardial exposure and evacuation b. Cath 06/2016: DES to SVG-LAD ostium  . Sarcoid   . Second degree AV block    a. ? possibly seen in 12/2016 clinic visit - EKG not truly suggestive  . Severe anemia   . 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 s/p left subclavian bypass.    Medications:  Medications Prior to Admission  Medication Sig Dispense Refill Last Dose  . acetaminophen (TYLENOL) 500 MG tablet Take 500-1,000 mg by mouth every 6 (six) hours as needed (pain).   06/06/2018 at pm  . aspirin EC 81 MG EC tablet Take 1 tablet (81 mg total) by mouth daily. (Patient taking differently: Take 81 mg by mouth daily at 6 PM. )   06/06/2018 at pm  . atorvastatin (LIPITOR) 80 MG tablet TAKE 1 TABLET (80 MG TOTAL) BY MOUTH DAILY AT 6 PM. 90 tablet 2 06/06/2018 at pm  . Calcium Carb-Cholecalciferol (CALCIUM 600+D3 PO) Take 1 tablet by mouth daily.    06/06/2018 at am  . clopidogrel (PLAVIX) 75 MG tablet TAKE 1 TABLET BY MOUTH DAILY WITH BREAKFAST (Patient taking differently: Take 75 mg by mouth daily. ) 90 tablet 3 06/06/2018 at  am  . Diclofenac Sodium 3 % GEL Place 1 application onto the skin 2 (two) times daily. To affected area. (Patient taking differently: Place 1 application onto the skin 2 (two) times daily as needed (arthritis pain). ) 100 g 0 2-3 weeks ago  . isosorbide mononitrate (IMDUR) 30 MG 24 hr tablet TAKE 1 TABLET BY MOUTH EVERY DAY (Patient taking differently: Take 30 mg by mouth daily. ) 30 tablet 6 06/06/2018 at am  . lisinopril (PRINIVIL,ZESTRIL) 2.5 MG tablet Take 1 tablet (2.5 mg total) by mouth daily. 90 tablet 4 06/06/2018 at am  . Multiple Vitamin (MULTIVITAMIN WITH MINERALS) TABS tablet Take 1 tablet by mouth daily.   06/06/2018 at am  . nitroGLYCERIN (NITROSTAT) 0.4 MG SL tablet PLACE 1 TABLET (0.4 MG TOTAL) UNDER THE TONGUE EVERY 5 (FIVE) MINUTES AS NEEDED FOR CHEST PAIN. 25 tablet 6 2-3 weeks agop  . ondansetron (ZOFRAN) 4 MG tablet Take 1 tablet (4 mg total) by  mouth every 6 (six) hours. (Patient taking differently: Take 2-4 mg by mouth daily. ) 12 tablet 0 06/06/2018 at am  . pantoprazole (PROTONIX) 40 MG tablet TAKE 1 TABLET BY MOUTH EVERY DAY (Patient taking differently: Take 40 mg by mouth daily. ) 30 tablet 6 06/06/2018 at am  . polyvinyl alcohol (ARTIFICIAL TEARS) 1.4 % ophthalmic solution Place 1-2 drops into both eyes daily as needed for dry eyes.   06/07/2018 at am  . warfarin (COUMADIN) 5 MG tablet TAKE 1/2 TO 1 TABLET BY MOUTH DAILY OR AS DIRECTED BY COUMADIN CLINIC (Patient taking differently: Take 2.5-5 mg by mouth See admin instructions. Take 1/2 tablet (2.5 mg) by mouth on Monday and Thursday at 6pm, take 1 tablet (5 mg) on Sunday, Tuesday, Wednesday, Friday, Saturday at 6pm) 90 tablet 0 06/06/2018 at 1800  . diazepam (VALIUM) 2 MG tablet Take 1 tablet (2 mg total) by mouth every 8 (eight) hours as needed for muscle spasms. (Patient not taking: Reported on 06/07/2018) 10 tablet 0 Not Taking at Unknown time  . enoxaparin (LOVENOX) 80 MG/0.8ML injection Inject 0.8 mLs (80 mg total) into the skin every 12 (twelve) hours for 7 doses. (Patient not taking: Reported on 06/07/2018) 7 Syringe 0 Not Taking at Unknown time  . fluticasone (FLONASE) 50 MCG/ACT nasal spray Place 1 spray into both nostrils daily. 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 Repair complicated by significant bleeding and fall in hemoglobin. Pt now s/p re-exploration in OR with no source identified but hematoma washed out. Heparin and coumadin resumed 9/11.  Heparin level this morning came back therapeutic at 0.43, on 950 units/hr. INR is 1.78 after 4 doses of warfarin. Hgb stable at 8.5, plt 274. No s/sx of bleeding. No infusion issues.   Home warfarin dose = 2.5mg Mon/Thurs; 5mg all other days  Goal of Therapy:   INR 2-3 Heparin level 0.3-0.5 units/ml Monitor platelets by anticoagulation protocol: Yes   Plan:  -Continue heparin at 950 units/hr -Warfarin 6 mg PO tonight -Recheck CBC, INR in am -Monitor for s/sx of bleeding  Jhonnie Aliano Perkins, PharmD Clinical Pharmacist  Pager: [phone] Phone: [phone] 06/16/2018     10 :39 AM

## 2018-06-16 NOTE — Consult Note (Signed)
Reason for Consult:pancreatitis Referring Physician: Dr. Rudean Haskell [name] is an 67 y.o. female.  HPI: Patient is a 67 year old female with a history of CAD, mechanical aortic valve replacement, cardiomyopathy, aortic aneurysm who was admitted approximately 5 days ago for pseudoaneurysm repair by Dr. Doren Custard.  Patient is currently on Plavix and Coumadin.  Postoperatively patient was having some epigastric abdominal pain, nausea.  Patient underwent ultrasound which did reveal gallstones.  Patient was laboratory studies did reveal signs consistent with pancreatitis.  Patient underwent CT scan which did reveal peripancreatic edema to the head neck of the pancreas.  Gastroenterology was consulted.  Patient was kept n.p.o.  And transition to a heart healthy diet.  Currently the patient states that she has minimal epigastric abdominal pain.  She states she has more pain when she is up and moving around.  She denies any nausea or pain with p.o.  Past Medical History:  Diagnosis Date  . Ascending aortic aneurysm (HCC)    a.  redo replacement of fusiform aneurysm of ascending aorta (complicated by pericardial hematoma s/p evacuation) in 2014.  Marland Kitchen Contrast media allergy   . Coronary artery disease    a. s/p CABG x2 in 2014. b. s/p DES to SVG-LAD in 2017.  . H/O mechanical aortic valve replacement   . Hyperlipidemia   . Hypertension   . Ischemic cardiomyopathy    a. EF 40-45% in 2017 at time of NSTEMI, improved to normal in 2018.  . Non-STEMI (non-ST elevated myocardial infarction) ([place])   . Premature atrial contractions   . PVC's (premature ventricular contractions)   . RA (rheumatoid arthritis) ([place])   . S/P AVR (aortic valve replacement) 08/1999   St. Jude AVR for aortic insuff, Goal INR 2.5-3.5  . 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 Prescott Gum) - complicated by pericardial hematoma --> subxiphoid pericardial  exposure and evacuation b. Cath 06/2016: DES to SVG-LAD ostium  . Sarcoid   . Second degree AV block    a. ? possibly seen in 12/2016 clinic visit - EKG not truly suggestive  . Severe anemia   . 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 s/p left subclavian bypass.    Past Surgical History:  Procedure Laterality Date  . AORTIC VALVE REPLACEMENT  08/23/1999   57m St. Jude, [place] procedure, root replacemtn   . BYPASS GRAFT ANGIOGRAPHY  05/20/2018   Procedure: BYPASS GRAFT ANGIOGRAPHY;  Surgeon: VJettie Booze MD;  [facility];  Service: Cardiovascular;;  . CARDIAC CATHETERIZATION  08/1999   normal L main, LAD & first diagonal are normal, normal Cfx, RCA with mild ostial narrowing, normal LV systolic function, severe AI, mild MR, normal PAP (Dr. ALoni Muse Little)   . CARDIAC CATHETERIZATION N/A 06/22/2016   Procedure: Left Heart Cath and Cors/Grafts Angiography;  Surgeon: DLeonie Man MD;  [facility];  Service: Cardiovascular;  Laterality: N/A;  . CARDIAC CATHETERIZATION N/A 06/23/2016   Procedure: Left Heart Cath and Coronary Angiography;  Surgeon: MSherren Mocha MD;  [facility];  Service: Cardiovascular;  Laterality: N/A;  . CARDIAC CATHETERIZATION N/A 06/23/2016   Procedure: Coronary Stent Intervention;  Surgeon: MSherren Mocha MD;  [facility];  Service: Cardiovascular;  Laterality: N/A;  . CORONARY ARTERY BYPASS GRAFT  11/05/2012   Procedure: CORONARY ARTERY BYPASS GRAFTING (CABG);  Surgeon: PIvin Poot MD;  [facility];  Service: Open Heart Surgery;  Laterality: N/A;  cannulate right subclavian  . CORONARY STENT INTERVENTION N/A 05/21/2018   Procedure: CORONARY STENT INTERVENTION;  Surgeon: Jettie Booze, MD;  Location: [place] [facility];  Service: Cardiovascular;  Laterality: N/A;  . ENDOVEIN HARVEST OF GREATER SAPHENOUS VEIN  11/05/2012   Procedure: ENDOVEIN HARVEST OF GREATER SAPHENOUS VEIN;   Surgeon: Ivin Poot, MD;  Location: [place];  Service: Open Heart Surgery;  Laterality: Right;  . FEMORAL-FEMORAL BYPASS GRAFT Left 06/09/2018   Procedure: REPAIR OF LEFT FEMORAL ARTERY PSEUDO ANEURYSM;  Surgeon: Angelia Mould, MD;  Location: [place];  Service: Vascular;  Laterality: Left;  . GROIN DEBRIDEMENT Left 06/11/2018   Procedure: EXPLORATION LEFT GROIN FOR BLEEDING;  Surgeon: Angelia Mould, MD;  Location: [place];  Service: Vascular;  Laterality: Left;  . IABP INSERTION N/A 05/21/2018   Procedure: IABP INSERTION;  Surgeon: Jettie Booze, MD;  Location: [facility];  Service: Cardiovascular;  Laterality: N/A;  . INTRAOPERATIVE TRANSESOPHAGEAL ECHOCARDIOGRAM  11/05/2012   Procedure: INTRAOPERATIVE TRANSESOPHAGEAL ECHOCARDIOGRAM;  Surgeon: Ivin Poot, MD;  Location: [place];  Service: Open Heart Surgery;  Laterality: N/A;  . INTRAOPERATIVE TRANSESOPHAGEAL ECHOCARDIOGRAM N/A 11/21/2012   Procedure: INTRAOPERATIVE TRANSESOPHAGEAL ECHOCARDIOGRAM;  Surgeon: Ivin Poot, MD;  Location: [place];  Service: Open Heart Surgery;  Laterality: N/A;  . LEFT HEART CATHETERIZATION WITH CORONARY ANGIOGRAM N/A 11/04/2012   Procedure: LEFT HEART CATHETERIZATION WITH CORONARY ANGIOGRAM;  Surgeon: Pixie Casino, MD;  Location: [facility];  Service: Cardiovascular;  Laterality: N/A;  . MEDIASTINOSCOPY  08/2005   bronchoscopy & mediastinoscopy for mediastinal adenopathy (Dr. Prescott Gum)   . NM MYOCAR PERF WALL MOTION  01/2012   lexiscan; small, fixed apical septal breast attenuation artifact & inferolateral bowel attenuation, no reversible ischemia, post-stress EF 64%, low risk scan   . PERICARDIAL WINDOW N/A 11/21/2012   Procedure: PERICARDIAL WINDOW;  Surgeon: Ivin Poot, MD;  Location: [place];  Service: Thoracic;  Laterality: N/A;  . REPLACEMENT ASCENDING AORTA  11/05/2012   Procedure: REPLACEMENT ASCENDING AORTA;  Surgeon: Ivin Poot, MD;  Location: [place];  Service: Open  Heart Surgery;  Laterality: N/A;  . SUBCLAVIAN ANGIOGRAM  05/10/2000   left subclavian arteriogram; patent graft, mild stenosis (Dr. Delton See)   . SUBCLAVIAN BYPASS GRAFT  05/16/1999   left subclavian bypass with 32m Darcon graft (Dr. GDelton See  . TRANSTHORACIC ECHOCARDIOGRAM  11/20/2012   EF 65-70%, mild conc LVH, grade 2 diastolic dysfunction, mech AV, mild MR    Family History  Problem Relation Age of Onset  . Hypertension Mother   . Colon cancer Neg Hx   . Esophageal cancer Neg Hx   . Pancreatic cancer Neg Hx   . Stomach cancer Neg Hx   . Liver disease Neg Hx     Social History:  reports that she has never smoked. She has never used smokeless tobacco. She reports that she does not drink alcohol or use drugs.  Allergies:  Allergies  Allergen Reactions  . Contrast Media [Iodinated Diagnostic Agents] Hives, Itching and Swelling    Medications: I have reviewed the patient's current medications.  Results for orders placed or performed during the hospital encounter of 06/07/18 (from the past 48 hour(s))  Protime-INR     Status: Abnormal   Collection Time: 06/15/18  4:17 AM  Result Value Ref Range   Prothrombin Time 20.0 (H) 11.4 - 15.2 seconds  INR 1.72     Comment: Performed at [facility], [place] [address] [place], [place] [address]  CBC     Status: Abnormal   Collection Time: 06/15/18  4:17 AM  Result Value Ref Range   WBC 9.3 4.0 - 10.5 K/uL   RBC 3.05 (L) 3.87 - 5.11 MIL/uL   Hemoglobin 8.2 (L) 12.0 - 15.0 g/dL   HCT 25.7 (L) 36.0 - 46.0 %   MCV 84.3 78.0 - 100.0 fL   MCH 26.9 26.0 - 34.0 pg   MCHC 31.9 30.0 - 36.0 g/dL   RDW 18.3 (H) 11.5 - 15.5 %   Platelets 265 150 - 400 K/uL    Comment: Performed at [facility], [place] [address], [place], [place] [address]  Heparin level (unfractionated)     Status: Abnormal   Collection Time: 06/15/18  4:17 AM  Result Value Ref Range   Heparin Unfractionated 0.26 (L) 0.30 - 0.70 IU/mL    Comment: (NOTE) If heparin  results are below expected values, and patient dosage has  been confirmed, suggest follow up testing of antithrombin III levels. Performed at [facility], [place] [address], [place], [place] [address]   Comprehensive metabolic panel     Status: Abnormal   Collection Time: 06/15/18  4:17 AM  Result Value Ref Range   Sodium 140 135 - 145 mmol/L   Potassium 3.7 3.5 - 5.1 mmol/L   Chloride 108 98 - 111 mmol/L   CO2 26 22 - 32 mmol/L   Glucose, Bld 91 70 - 99 mg/dL   BUN 10 8 - 23 mg/dL   Creatinine, Ser 0.73 0.44 - 1.00 mg/dL   Calcium 8.2 (L) 8.9 - 10.3 mg/dL   Total Protein 4.4 (L) 6.5 - 8.1 g/dL   Albumin 2.1 (L) 3.5 - 5.0 g/dL   AST 18 15 - 41 U/L   ALT 40 0 - 44 U/L   Alkaline Phosphatase 135 (H) 38 - 126 U/L   Total Bilirubin 1.0 0.3 - 1.2 mg/dL   GFR calc non Af Amer >60 >60 mL/min   GFR calc Af Amer >60 >60 mL/min    Comment: (NOTE) The eGFR has been calculated using the CKD EPI equation. This calculation has not been validated in all clinical situations. eGFR's persistently <60 mL/min signify possible Chronic Kidney Disease.    Anion gap 6 5 - 15    Comment: Performed at [place] [address], [place], [place] [address]  Heparin level (unfractionated)     Status: Abnormal   Collection Time: 06/15/18  2:15 PM  Result Value Ref Range   Heparin Unfractionated 0.24 (L) 0.30 - 0.70 IU/mL    Comment: (NOTE) If heparin results are below expected values, and patient dosage has  been confirmed, suggest follow up testing of antithrombin III levels. Performed at [facility], [place] [address] [place], [place] [address]   Heparin level (unfractionated)     Status: None   Collection Time: 06/15/18 11:01 PM  Result Value Ref Range   Heparin Unfractionated 0.32 0.30 - 0.70 IU/mL    Comment: (NOTE) If heparin results are below expected values, and patient dosage has  been confirmed, suggest follow up testing of antithrombin III levels. Performed at [facility], [place] [address] [place], [place] [address]   Protime-INR     Status: Abnormal   Collection Time: 06/16/18  5:01 AM  Result Value Ref Range  Prothrombin Time 20.6 (H) 11.4 - 15.2 seconds   INR 1.78     Comment: Performed at [facility], [place] [address], [place], [place] [address]  CBC     Status: Abnormal   Collection Time: 06/16/18  5:01 AM  Result Value Ref Range   WBC 8.0 4.0 - 10.5 K/uL   RBC 3.19 (L) 3.87 - 5.11 MIL/uL   Hemoglobin 8.5 (L) 12.0 - 15.0 g/dL   HCT 27.2 (L) 36.0 - 46.0 %   MCV 85.3 78.0 - 100.0 fL   MCH 26.6 26.0 - 34.0 pg   MCHC 31.3 30.0 - 36.0 g/dL   RDW 18.5 (H) 11.5 - 15.5 %   Platelets 274 150 - 400 K/uL    Comment: Performed at [facility], [place] [address], [place], [place] [address]  Heparin level (unfractionated)     Status: None   Collection Time: 06/16/18  5:01 AM  Result Value Ref Range   Heparin Unfractionated 0.43 0.30 - 0.70 IU/mL    Comment: (NOTE) If heparin results are below expected values, and patient dosage has  been confirmed, suggest follow up testing of antithrombin III levels. Performed at [facility], [place] [address], [place], [place] [address]     Ct Abdomen Pelvis Wo Contrast  Result Date: 06/15/2018 CLINICAL DATA:  Acute pancreatitis. First episode. Unknown etiology. Prior left groin debridement for bleeding 4 days ago. Femoral femoral bypass graft 6 days ago. EXAM: CT ABDOMEN AND PELVIS WITHOUT CONTRAST TECHNIQUE: Multidetector CT imaging of the abdomen and pelvis was performed following the standard protocol without IV contrast. COMPARISON:  Plain films 06/09/2018. Abdominal ultrasound 06/09/2018. PET 07/21/2005 FINDINGS: Lower chest: Clear lung bases. Mild cardiomegaly, without pericardial or pleural effusion. Tortuous descending thoracic aorta. Prior median sternotomy. Hepatobiliary: Normal liver. Multiple dependent small gallstones. No evidence of acute cholecystitis. No biliary duct dilatation.  Pancreas: Possible edema within the pancreatic head and neck, including on image 36/3. Is most apparent when compared to the prior PET of 07/21/2005. No pancreatic duct dilatation or peripancreatic fluid collection. Spleen: Normal in size, without focal abnormality. Adrenals/Urinary Tract: Normal adrenal glands. Right extrarenal pelvis. No renal calculi or hydronephrosis. An upper pole left renal low-density lesion of 2.9 cm is likely a cyst. No hydroureter or ureteric calculi. No bladder calculi. Stomach/Bowel: Normal stomach, without wall thickening. Colonic stool burden suggests constipation. Scattered colonic diverticula. Normal terminal ileum. Normal small bowel. Vascular/Lymphatic: Aortic and branch vessel atherosclerosis. No abdominopelvic adenopathy. Reproductive: Normal uterus and adnexa. Other: No significant free fluid. Extensive edema centered about the left groin with ill-defined hyperattenuating material/hemorrhage and minimal gas. Musculoskeletal: Lumbosacral spondylosis. Minimal motion degradation in the upper abdomen with mild artifact from EKG wire and lead. IMPRESSION: 1. Mild degradation secondary to motion, minimal artifact, and lack of IV contrast. 2. Subtle pancreatic head and neck edema suspected, especially given the clinical history. Limited evaluation for choledocholithiasis or pancreas divisum secondary to technique. 3. Cholelithiasis. 4.  Possible constipation. 5. Hemorrhage and small volume gas in the left groin, consistent with the clinical history. 6.  Possible constipation.   Aortic Atherosclerosis (ICD10-I70.0). Electronically Signed   By: Abigail Miyamoto M.D.   On: 06/15/2018 00:05    Review of Systems  Constitutional: Negative for chills, fever and malaise/fatigue.  HENT: Negative for ear discharge, hearing loss and sore throat.   Eyes: Negative for blurred vision and discharge.  Respiratory: Negative for cough and shortness of breath.   Cardiovascular: Negative for  chest  pain, orthopnea and leg swelling.  Gastrointestinal: Positive for abdominal pain (epigastric ). Negative for constipation, diarrhea, heartburn, nausea and vomiting.  Musculoskeletal: Negative for myalgias and neck pain.  Skin: Negative for itching and rash.  Neurological: Negative for dizziness, focal weakness, seizures and loss of consciousness.  Endo/Heme/Allergies: Negative for environmental allergies. Does not bruise/bleed easily.  Psychiatric/Behavioral: Negative for depression and suicidal ideas.  All other systems reviewed and are negative.  Blood pressure (!) 143/48, pulse 63, temperature 97.6 F (36.4 C), temperature source Oral, resp. rate 12, height 5' 7.01" (1.702 m), weight 78.2 kg, SpO2 100 %. Physical Exam  Constitutional: She is oriented to person, place, and time. Vital signs are normal. She appears well-developed and well-nourished.  Conversant No acute distress  HENT:  Head: Normocephalic and atraumatic.  Eyes: Lids are normal. No scleral icterus.  No lid lag Moist conjunctiva  Neck: No tracheal tenderness present. No thyromegaly present.  No cervical lymphadenopathy  Cardiovascular: Normal rate, regular rhythm and intact distal pulses.  No murmur heard. Respiratory: Effort normal and breath sounds normal. She has no wheezes. She has no rales.  GI: Soft. Bowel sounds are normal. She exhibits no distension. There is no hepatosplenomegaly. There is tenderness (epigastric abd pain). There is no rebound and no guarding. No hernia.  Neurological: She is alert and oriented to person, place, and time.  Normal gait and station  Skin: Skin is warm. No rash noted. No cyanosis. Nails show no clubbing.  Normal skin turgor  Psychiatric: Judgment normal.  Appropriate affect    Assessment/Plan: 67 year old female with likely biliary pancreatitis Status post pseudoaneurysm repair CAD Aortic valve replacement Cardiomyopathy  1.  Patient's pancreatitis appears to be  improving for laboratory values as well as clinically.  Patient likely okay for continuing her diet as tolerated. 2.  At this time patient is currently on heparin drip, on Coumadin, on Plavix.  Patient likely to need cholecystectomy at some point.  This potentially could be during his hospital stay versus outpatient as patient is currently undergoing optimization for anticoagulation.  3.  We will continue to follow her abdominal exam.  Rosario Jacks., Anne Hahn 06/16/2018, 3:02 PM

## 2018-06-16 NOTE — Progress Notes (Signed)
Subjective: Having bowel movements. Less abdominal pain/soreness  Objective: Vital signs in last 24 hours: Temp:  [97.5 F (36.4 C)-98.8 F (37.1 C)] 97.6 F (36.4 C) (09/15 0456) Pulse Rate:  [54-76] 63 (09/15 0456) Resp:  [12-22] 12 (09/15 0456) BP: (99-143)/(39-54) 143/48 (09/15 0456) SpO2:  [100 %] 100 % (09/15 0456) Weight:  [78.2 kg] 78.2 kg (09/15 0456) Weight change: 0.2 kg Last BM Date: 06/15/18  PE: GEN:  NAD ABD:  Soft, mild epigastric tenderness  Lab Results: CBC    Component Value Date/Time   WBC 8.0 06/16/2018 0501   RBC 3.19 (L) 06/16/2018 0501   HGB 8.5 (L) 06/16/2018 0501   HCT 27.2 (L) 06/16/2018 0501   PLT 274 06/16/2018 0501   MCV 85.3 06/16/2018 0501   MCH 26.6 06/16/2018 0501   MCHC 31.3 06/16/2018 0501   RDW 18.5 (H) 06/16/2018 0501   LYMPHSABS 0.5 (L) 06/07/2018 2041   MONOABS 0.6 06/07/2018 2041   EOSABS 0.1 06/07/2018 2041   BASOSABS 0.0 06/07/2018 2041   CMP     Component Value Date/Time   NA 140 06/15/2018 0417   K 3.7 06/15/2018 0417   CL 108 06/15/2018 0417   CO2 26 06/15/2018 0417   GLUCOSE 91 06/15/2018 0417   BUN 10 06/15/2018 0417   CREATININE 0.73 06/15/2018 0417   CREATININE 0.77 07/14/2015 1615   CALCIUM 8.2 (L) 06/15/2018 0417   PROT 4.4 (L) 06/15/2018 0417   ALBUMIN 2.1 (L) 06/15/2018 0417   AST 18 06/15/2018 0417   ALT 40 06/15/2018 0417   ALKPHOS 135 (H) 06/15/2018 0417   BILITOT 1.0 06/15/2018 0417   GFRNONAA >60 06/15/2018 0417   GFRAA >60 06/15/2018 0417    Assessment:  1.  Femoral artery pseudoaneurysm repair. 2.  Abdominal pain, improving. 3.  Pancreatitis, CT- and lab-proven.  Unclear etiology:  Trace elevation of LFTs, has gallstones. 4.  Constipation, resolved with Miralax. 5.  Gallstones.  Plan:  1.  Diet back advanced to heart healthy. 2.  CCS consultation tomorrow re: candidacy for, and potential timing of, cholecystectomy. 3.  Not MRCP candidate re: metal heart valve. 4.  No further GI  work-up anticipated; [facility] will sign-off; please call with questions; thank you for the consultation.   [name] 06/16/2018, 1:02 PM   Cell [phone] If no answer or after 5 PM call [phone]

## 2018-06-16 NOTE — Progress Notes (Signed)
Progress Note  Patient Name: [name] Date of Encounter: 06/16/2018  Primary Cardiologist: Chrystie Nose, MD   Subjective   Had a BM last night and this morning. Less abdominal pain. Has some soreness at surgical site. Denies chest pain, fevers, shortness of breath, palpitations, and leg swelling. Wants to go home but knows INR has to be therapeutic.  Inpatient Medications    Scheduled Meds: . sodium chloride   Intravenous Once  . sodium chloride   Intravenous Once  . atorvastatin  80 mg Oral q1800  . clopidogrel  75 mg Oral Q breakfast  . docusate sodium  100 mg Oral Daily  . famotidine  20 mg Oral Daily  . pantoprazole  40 mg Oral Daily  . polyethylene glycol  17 g Oral BID  . Warfarin - Pharmacist Dosing Inpatient   Does not apply q1800   Continuous Infusions: . sodium chloride 50 mL/hr at 06/16/18 0410  . heparin 950 Units/hr (06/15/18 2040)  . lactated ringers 10 mL/hr at 06/11/18 1431   PRN Meds: acetaminophen, alum & mag hydroxide-simeth, magnesium hydroxide, morphine injection, nitroGLYCERIN, ondansetron (ZOFRAN) IV, oxyCODONE-acetaminophen   Vital Signs    Vitals:   06/15/18 1544 06/15/18 2027 06/15/18 2347 06/16/18 0456  BP: (!) 99/42 (!) 100/54 (!) 113/39 (!) 143/48  Pulse: 67 76 (!) 54 63  Resp: 18 (!) 22 13 12   Temp:  98.8 F (37.1 C) (!) 97.5 F (36.4 C) 97.6 F (36.4 C)  TempSrc:  Oral Oral Oral  SpO2: 100% 100% 100% 100%  Weight:    78.2 kg  Height:        Intake/Output Summary (Last 24 hours) at 06/16/2018 1030 Last data filed at 06/16/2018 0300 Gross per 24 hour  Intake 2803.94 ml  Output 700 ml  Net 2103.94 ml   Filed Weights   06/14/18 0412 06/15/18 0638 06/16/18 0456  Weight: 77 kg 78 kg 78.2 kg    Telemetry    Sinus rhythm, sinus bradycardia, PAC's, PVC's - Personally Reviewed  ECG    NA - Personally Reviewed  Physical Exam   GEN: No acute distress.   Neck: No JVD Cardiac: RRR, prosthetic click appreciated, 2/6  ejection systolic murmur best heard at right upper sternal border, no rubs, or gallops.  Respiratory: Clear to auscultation bilaterally. GI: Soft, nontender, non-distended  MS: No edema; No deformity. Neuro:  Nonfocal  Psych: Normal affect   Labs    Chemistry Recent Labs  Lab 06/12/18 0333 06/14/18 1319 06/15/18 0417  NA 143 141 140  K 4.2 3.8 3.7  CL 111 108 108  CO2 26 25 26   GLUCOSE 113* 153* 91  BUN 10 7* 10  CREATININE 0.94 0.76 0.73  CALCIUM 8.4* 8.6* 8.2*  PROT 4.8* 5.0* 4.4*  ALBUMIN 2.3* 2.4* 2.1*  AST 53* 22 18  ALT 98* 51* 40  ALKPHOS 193* 152* 135*  BILITOT 1.0 1.1 1.0  GFRNONAA >60 >60 >60  GFRAA >60 >60 >60  ANIONGAP 6 8 6      Hematology Recent Labs  Lab 06/14/18 0407 06/15/18 0417 06/16/18 0501  WBC 10.5 9.3 8.0  RBC 3.17* 3.05* 3.19*  HGB 8.5* 8.2* 8.5*  HCT 27.0* 25.7* 27.2*  MCV 85.2 84.3 85.3  MCH 26.8 26.9 26.6  MCHC 31.5 31.9 31.3  RDW 18.6* 18.3* 18.5*  PLT 286 265 274    Cardiac EnzymesNo results for input(s): TROPONINI in the last 168 hours. No results for input(s): TROPIPOC in the last 168  hours.   BNPNo results for input(s): BNP, PROBNP in the last 168 hours.   DDimer No results for input(s): DDIMER in the last 168 hours.   Radiology    Ct Abdomen Pelvis Wo Contrast  Result Date: 06/15/2018 CLINICAL DATA:  Acute pancreatitis. First episode. Unknown etiology. Prior left groin debridement for bleeding 4 days ago. Femoral femoral bypass graft 6 days ago. EXAM: CT ABDOMEN AND PELVIS WITHOUT CONTRAST TECHNIQUE: Multidetector CT imaging of the abdomen and pelvis was performed following the standard protocol without IV contrast. COMPARISON:  Plain films 06/09/2018. Abdominal ultrasound 06/09/2018. PET 07/21/2005 FINDINGS: Lower chest: Clear lung bases. Mild cardiomegaly, without pericardial or pleural effusion. Tortuous descending thoracic aorta. Prior median sternotomy. Hepatobiliary: Normal liver. Multiple dependent small gallstones.  No evidence of acute cholecystitis. No biliary duct dilatation. Pancreas: Possible edema within the pancreatic head and neck, including on image 36/3. Is most apparent when compared to the prior PET of 07/21/2005. No pancreatic duct dilatation or peripancreatic fluid collection. Spleen: Normal in size, without focal abnormality. Adrenals/Urinary Tract: Normal adrenal glands. Right extrarenal pelvis. No renal calculi or hydronephrosis. An upper pole left renal low-density lesion of 2.9 cm is likely a cyst. No hydroureter or ureteric calculi. No bladder calculi. Stomach/Bowel: Normal stomach, without wall thickening. Colonic stool burden suggests constipation. Scattered colonic diverticula. Normal terminal ileum. Normal small bowel. Vascular/Lymphatic: Aortic and branch vessel atherosclerosis. No abdominopelvic adenopathy. Reproductive: Normal uterus and adnexa. Other: No significant free fluid. Extensive edema centered about the left groin with ill-defined hyperattenuating material/hemorrhage and minimal gas. Musculoskeletal: Lumbosacral spondylosis. Minimal motion degradation in the upper abdomen with mild artifact from EKG wire and lead. IMPRESSION: 1. Mild degradation secondary to motion, minimal artifact, and lack of IV contrast. 2. Subtle pancreatic head and neck edema suspected, especially given the clinical history. Limited evaluation for choledocholithiasis or pancreas divisum secondary to technique. 3. Cholelithiasis. 4.  Possible constipation. 5. Hemorrhage and small volume gas in the left groin, consistent with the clinical history. 6.  Possible constipation.   Aortic Atherosclerosis (ICD10-I70.0). Electronically Signed   By: Jeronimo Greaves M.D.   On: 06/15/2018 00:05    Cardiac Studies   None this admission  Patient Profile     67 y.o. female withaortic insufficiency s/p St. Jude AVR 2000, subclavian artery stenosis s/p bypass 2001, CAD s/p CABGx2 (SVG-LAD and SVG-Cx) in 2014 with redo  replacement of fusiform aneurysm of ascending aorta (complicated by pericardial hematoma s/p evacuation), NSTEMI 2017 with ICM 40-45% and cath resulting in PCI of SVG-LAD, EF normalized in 2018, PACs/PVCs, RA, sarcoidosis, dyslipidemia, contrast allergy (hives). She was recently seen in ER with chest pain/elevated troponin. She initially refused admission that day but agreed to return the following day. F/u troponin was negative. Cath showedrestenosis of the stent of the ostium of the SVG to the LAD and the left circumflex artery.She returned for PCI of LM on 05/21/18 with plan to continue Coumadin+ASA+Plavix x 1 month then stopping the aspirin. She wasseen in the office on 06/07/18 with post cath groin concerns found to havepseudoaneurysmby ultrasound.She went to the OR with VVS 9/8 and back on 9/10.  Assessment & Plan    1. Pseudoaneurysm, left groin, post-cath site - vascular on board, to OR 9/8 and 9/10 - she has been cleared to resume anticoag (now on heparin->Coumadin) - home when INR therapeutic  2. Blood loss anemia - vascular surgery gave 2U PRBCthis admission - Hgb relatively stable, need to follow closely. 8.5 today.  3. Mechanical  aortic valve - on heparin - Coumadin resumed 06/12/18, INR 1.78 today. Warfarin as per pharmacy.  4. CAD s/pprior CABG, PCI, and most recent furtherPCI 05/2018 - was on triple therapy: ASA, plavix, and coumadin - continue plavix only for now as she is on IV heparin and warfarin  5. Epigastric pain, left upper abdominal pain, constipation with elevated lipase/amlyase -Normal amylase and lipase.  Abdominal CT reviewed above.  Evaluated by gastroenterology.  They have recommended surgical consultation in a day or two pending improvement of her pancreatitis for consideration of cholecystectomy.  6. PVCs/PACs -asymptomatic - last lytes wnl on 9/14  7. Hematuriareported earlier this admission - no further hematuria - will follow as we  added back coumadin  For questions or updates, please contact [facility] Please consult [email or web address] for contact info under Cardiology/STEMI.      Signed, Prentice Docker, MD  06/16/2018, 10:30 AM

## 2018-06-16 NOTE — Progress Notes (Signed)
ANTICOAGULATION CONSULT NOTE  Pharmacy Consult for heparin Indication: Mechanical AVR  Allergies  Allergen Reactions  . Contrast Media [Iodinated Diagnostic Agents] Hives, Itching and Swelling    Patient Measurements: Height: 5' 7.01" (170.2 cm) Weight: 171 lb 15.3 oz (78 kg) IBW/kg (Calculated) : 61.62 Heparin Dosing Weight: 70 kg  Vital Signs: Temp: 97.5 F (36.4 C) (09/14 2347) Temp Source: Oral (09/14 2347) BP: 113/39 (09/14 2347) Pulse Rate: 54 (09/14 2347)  Labs: Recent Labs    06/13/18 0349 06/14/18 0407 06/14/18 1319 06/15/18 0417 06/15/18 1415 06/15/18 2301  HGB 8.7* 8.5*  --  8.2*  --   --   HCT 27.4* 27.0*  --  25.7*  --   --   PLT 295 286  --  265  --   --   LABPROT 16.3* 17.4*  --  20.0*  --   --   INR 1.32 1.44  --  1.72  --   --   HEPARINUNFRC 0.38 0.30  --  0.26* 0.24* 0.32  CREATININE  --   --  0.76 0.73  --   --     Estimated Creatinine Clearance: 75.5 mL/min (by C-G formula based on SCr of 0.73 mg/dL).  Assessment:  67 y.o. female with AVR for heparin  Goal of Therapy:  INR 2-3 Heparin level 0.3-0.5 units/ml Monitor platelets by anticoagulation protocol: Yes   Plan:  Continue Heparin at current rate  Geannie Risen, PharmD, BCPS  06/16/2018     12:16 AM

## 2018-06-17 LAB — HEPARIN LEVEL (UNFRACTIONATED): HEPARIN UNFRACTIONATED: 0.39 [IU]/mL (ref 0.30–0.70)

## 2018-06-17 LAB — PROTIME-INR
INR: 1.92
Prothrombin Time: 21.8 seconds — ABNORMAL HIGH (ref 11.4–15.2)

## 2018-06-17 MED ORDER — WARFARIN SODIUM 3 MG PO TABS
6.0000 mg | ORAL_TABLET | Freq: Once | ORAL | Status: AC
  Administered 2018-06-17: 6 mg via ORAL
  Filled 2018-06-17: qty 2

## 2018-06-17 NOTE — Progress Notes (Signed)
[facility]  Subjective  - Doing well still has abdominal pain suprumbilical area and left groin, but getting better.   Objective (!) 110/42 (!) 118 97.8 F (36.6 C) (Oral) 20 100%  Intake/Output Summary (Last 24 hours) at 06/17/2018 0714 Last data filed at 06/17/2018 0400 Gross per 24 hour  Intake 1562.5 ml  Output -  Net 1562.5 ml   Left groin with resolving hematoma, no active bleeding Drain site clean and dry Feet warm well perfused   Assessment/Planning: S/P repair of pseudoaneurysms and re exploration for post op bleeding Stable from vascular point of view to discharge once INR is therapeutic.     Mosetta Pigeon 06/17/2018 7:14 AM --  Laboratory Lab Results: Recent Labs    06/15/18 0417 06/16/18 0501  WBC 9.3 8.0  HGB 8.2* 8.5*  HCT 25.7* 27.2*  PLT 265 274   BMET Recent Labs    06/14/18 1319 06/15/18 0417  NA 141 140  K 3.8 3.7  CL 108 108  CO2 25 26  GLUCOSE 153* 91  BUN 7* 10  CREATININE 0.76 0.73  CALCIUM 8.6* 8.2*    COAG Lab Results  Component Value Date   INR 1.78 06/16/2018   INR 1.72 06/15/2018   INR 1.44 06/14/2018   No results found for: PTT

## 2018-06-17 NOTE — Progress Notes (Signed)
ANTICOAGULATION CONSULT NOTE   Pharmacy Consult for heparin Indication: Mechanical AVR  Allergies  Allergen Reactions  . Contrast Media [Iodinated Diagnostic Agents] Hives, Itching and Swelling    Patient Measurements: Height: 5' 7.01" (170.2 cm) Weight: 174 lb 1.6 oz (79 kg) IBW/kg (Calculated) : 61.62 Heparin Dosing Weight: 70 kg  Vital Signs: Temp: 97.8 F (36.6 C) (09/16 0445) Temp Source: Oral (09/16 0445) BP: 110/42 (09/16 0445)  Labs: Recent Labs    06/14/18 1319 06/15/18 0417  06/15/18 2301 06/16/18 0501 06/17/18 0717  HGB  --  8.2*  --   --  8.5*  --   HCT  --  25.7*  --   --  27.2*  --   PLT  --  265  --   --  274  --   LABPROT  --  20.0*  --   --  20.6* 21.8*  INR  --  1.72  --   --  1.78 1.92  HEPARINUNFRC  --  0.26*   < > 0.32 0.43 0.39  CREATININE 0.76 0.73  --   --   --   --    < > = values in this interval not displayed.    Estimated Creatinine Clearance: 75.9 mL/min (by C-G formula based on SCr of 0.73 mg/dL).   Medical History: Past Medical History:  Diagnosis Date  . Ascending aortic aneurysm (HCC)    a.  redo replacement of fusiform aneurysm of ascending aorta (complicated by pericardial hematoma s/p evacuation) in 2014.  Marland Kitchen Contrast media allergy   . Coronary artery disease    a. s/p CABG x2 in 2014. b. s/p DES to SVG-LAD in 2017.  . H/O mechanical aortic valve replacement   . Hyperlipidemia   . Hypertension   . Ischemic cardiomyopathy    a. EF 40-45% in 2017 at time of NSTEMI, improved to normal in 2018.  . Non-STEMI (non-ST elevated myocardial infarction) (HCC)   . Premature atrial contractions   . PVC's (premature ventricular contractions)   . RA (rheumatoid arthritis) (HCC)   . S/P AVR (aortic valve replacement) 08/1999   St. Jude AVR for aortic insuff, Goal INR 2.5-3.5  . 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 Donata Clay) - complicated by  pericardial hematoma --> subxiphoid pericardial exposure and evacuation b. Cath 06/2016: DES to SVG-LAD ostium  . Sarcoid   . Second degree AV block    a. ? possibly seen in 12/2016 clinic visit - EKG not truly suggestive  . Severe anemia   . 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 s/p left subclavian bypass.    Assessment:  56 YOF with a mechanical AVR on warfarin at home here with pseudoaneurysm near her PCI groin site now s/p repair with VVS 9/8. Repair complicated by significant bleeding and fall in hemoglobin. Pt now s/p re-exploration in OR with no source identified but hematoma washed out. Heparin and coumadin resumed 9/11.  Heparin level this morning continues to be therapeutic at 0.39, on 950 units/hr. INR is 1.92 after 5 doses of warfarin. Hgb stable as of yesterday, cbc dropped off today, will reorder. Should be able to stop heparin in am.   Home warfarin dose = 2.5mg  Mon/Thurs; 5mg  all other days  Goal of Therapy:  INR 2-3 Heparin level 0.3-0.5 units/ml Monitor platelets by anticoagulation protocol: Yes   Plan:  -Continue heparin at 950  units/hr -Warfarin 6 mg PO tonight -Recheck CBC, INR in am -Monitor for s/sx of bleeding  Sheppard Coil PharmD., BCPS Clinical Pharmacist 06/17/2018 8:13 AM

## 2018-06-17 NOTE — Progress Notes (Addendum)
Progress Note  Patient Name: [name] Date of Encounter: 06/17/2018  Primary Cardiologist: Chrystie Nose, MD   Subjective   Patient denies chest pain, palpitations, or feeling as if her heart is racing.  She does have some pain located around her L groin area. Denies stomach pain, nausea, vomiting, diarrhea. No headache.   She reports she is eager to go home.  Inpatient Medications    Scheduled Meds: . sodium chloride   Intravenous Once  . sodium chloride   Intravenous Once  . atorvastatin  80 mg Oral q1800  . clopidogrel  75 mg Oral Q breakfast  . docusate sodium  100 mg Oral Daily  . famotidine  20 mg Oral Daily  . pantoprazole  40 mg Oral Daily  . polyethylene glycol  17 g Oral BID  . warfarin  6 mg Oral ONCE-1800  . Warfarin - Pharmacist Dosing Inpatient   Does not apply q1800   Continuous Infusions: . sodium chloride 50 mL/hr at 06/16/18 2127  . heparin 950 Units/hr (06/16/18 2129)  . lactated ringers 10 mL/hr at 06/11/18 1431   PRN Meds: acetaminophen, alum & mag hydroxide-simeth, magnesium hydroxide, morphine injection, nitroGLYCERIN, ondansetron (ZOFRAN) IV, oxyCODONE-acetaminophen   Vital Signs    Vitals:   06/16/18 0500 06/16/18 2106 06/17/18 0030 06/17/18 0445  BP: (!) 143/48 (!) 99/59 (!) 106/36 (!) 110/42  Pulse: (!) 118     Resp: 13 15 12 20   Temp:  98.2 F (36.8 C) 97.9 F (36.6 C) 97.8 F (36.6 C)  TempSrc:  Oral Oral Oral  SpO2: 99% 100% 100% 100%  Weight:    79 kg  Height:        Intake/Output Summary (Last 24 hours) at 06/17/2018 1020 Last data filed at 06/17/2018 0828 Gross per 24 hour  Intake 1823.8 ml  Output -  Net 1823.8 ml   Filed Weights   06/15/18 0638 06/16/18 0456 06/17/18 0445  Weight: 78 kg 78.2 kg 79 kg    Telemetry    HR 60-70s - Personally Reviewed  Physical Exam   GEN: No acute distress.  About to eat.  Neck: No JVD Cardiac: RRR, mechanical valve with click & ejection systolic murmur heard, rubs, or  gallops.  Respiratory: Clear to auscultation bilaterally. GI: Soft, L side mildly tender in LLQ as near the groin MS: No edema; No deformity. Neuro:  Nonfocal  Psych: Normal affect   Labs    Chemistry Recent Labs  Lab 06/12/18 0333 06/14/18 1319 06/15/18 0417  NA 143 141 140  K 4.2 3.8 3.7  CL 111 108 108  CO2 26 25 26   GLUCOSE 113* 153* 91  BUN 10 7* 10  CREATININE 0.94 0.76 0.73  CALCIUM 8.4* 8.6* 8.2*  PROT 4.8* 5.0* 4.4*  ALBUMIN 2.3* 2.4* 2.1*  AST 53* 22 18  ALT 98* 51* 40  ALKPHOS 193* 152* 135*  BILITOT 1.0 1.1 1.0  GFRNONAA >60 >60 >60  GFRAA >60 >60 >60  ANIONGAP 6 8 6      Hematology Recent Labs  Lab 06/14/18 0407 06/15/18 0417 06/16/18 0501  WBC 10.5 9.3 8.0  RBC 3.17* 3.05* 3.19*  HGB 8.5* 8.2* 8.5*  HCT 27.0* 25.7* 27.2*  MCV 85.2 84.3 85.3  MCH 26.8 26.9 26.6  MCHC 31.5 31.9 31.3  RDW 18.6* 18.3* 18.5*  PLT 286 265 274    Cardiac EnzymesNo results for input(s): TROPONINI in the last 168 hours. No results for input(s): TROPIPOC in the last 168  hours.   BNPNo results for input(s): BNP, PROBNP in the last 168 hours.   DDimer No results for input(s): DDIMER in the last 168 hours.   Radiology    No results found.  Cardiac Studies   05/22/18 TTE Left ventricle: The cavity size was normal. Wall thickness was   normal. Systolic function was normal. The estimated ejection   fraction was in the range of 60% to 65%. Wall motion was normal;   there were no regional wall motion abnormalities. Doppler   parameters are consistent with abnormal left ventricular   relaxation (grade 1 diastolic dysfunction). - Aortic valve: A mechanical prosthesis was present. Transvalvular   gradient is higher than expected. Consider further evaluation   with fluoroscopy or TEE to evaluate metalic leaflet excursion.   Peak velocity (S): 413 cm/s. 280cm/s prior ECHO. Valve area   (VTI): 0.98 cm^2. Valve area (Vmax): 0.9 cm^2. Valve area   (Vmean): 0.91 cm^2. -  Mitral valve: There was mild regurgitation. - Left atrium: The atrium was mildly dilated.  05/21/18 PCI  Ost LM lesion is 95% stenosed.  A drug-eluting stent was successfully placed using a STENT RESOLUTE ONYX 5.0X12.  Post intervention, there is a 0% residual stenosis.  IABP placed preprocedure for left main intervention.  Prox LAD lesion is 10% stenosed.   05/20/2018 LHC/RHC with cors/ graft angio  Ost LM to LM lesion is 95% stenosed.  Ost LAD to Prox LAD lesion is 100% stenosed.  Ost 1st Mrg lesion is 90% stenosed.  Ost RCA to Prox RCA lesion is 100% stenosed. Not injected today.  SVG Y-graft to LAD and circumflex. Possible Origin lesion is 90% stenosed of the prior SVG stent from 2017.  Normal right heart pressures.  Ao sat 98%, PA sat 76%, CO 6.4 L/min; CI 3.5; mean PA 13 mm Hg; mean PCWP 8 mm Hg  Thoracic aortic tortuosity made catheter manipulation more difficult.  Chest pain at the conclusion of the procedure was relieved with IV NTG. Significant ST depressions when she had CP and increased BP . Will need close monitoring of BP. Possible severe in-stent restenosis of 4.0 mm Synergy stent from 2017.  Severe native disease including left main.  There is a long segment of stent hanging outside of the graft, which makes it difficult to selectively engage the vessel and know if the portion of the stent inside the vessel is patent. Prox LAD is occluded.  Fixing native left main would not help anterior wall.   If Graft stent has severe restenosis once, and restenting may not give a different longterm result.   Need to know LV function.  Check echo.  Would see if she is a redo candidate as LIMA was not used the last surgery.   Will check options for revascularization.  Higher contrast load due to difficulty finding grafts  Recheck kidney function in AM. Consider CT scan to see if we can better define patency of the stent.  Given her chest pain and ECG changes, I suspect  there is significant disease in the stent causing her ischemia.  Patient Profile     67 y.o. female is a 67 y.o. female with a hx of aortic insufficiency s/p St. Jude AVR 2000, subclavian artery stenosis s/p bypass 2001, CAD s/p CABGx2 (SVG-LAD, SVG-Cx) in 2014 with redo replacement of fusiform aneurysm of ascending aorta (complicated by pericardial hematoma s/p evacuation), NSTEMI 2017 with ICM 40-45% and cath resulting in PCI of SVG-LAD, EF normalized in 2018,  PACs/PVCs, RA, sarcoidosis, dyslipidemia, contrast allergy (hives). She was recently seen in ER with chest pain/elevated troponin. She initially refused admission that day but agreed to return the following day. F/u troponin was negative. 05/20/18 Cath showed restenosis of the stent of the ostium of the SVG to the LAD and the left circumflex artery. She returned for PCI of LM on 05/21/18 with plan to continue Coumadin+ASA+Plavix x 1 month then stopping the aspirin. She was seen in the office on 06/07/18 with post cath groin concerns found to havepseudoaneurysmby ultrasound.She went to the OR with VVS 9/8 and back on 9/10.  Assessment & Plan    Pseudoaneurysm, left groin, post-cath site - Seen by vascular. OR 9/8, 9/10 for exploration and repair of L femoral artery pseudoaneurysm.  - Started Coumadin 06/12/18. Also on Plavix, Heparin. - 9/16 INR 1.92  - Plan for home / discharge once INR therapeutic.  - Suspect we will be able to discharge tomorrow   CAD s/p CABG, PCI with most recent 05/20/2018  - Discharged previously on triple therapy: Coumadin, Asa, Plavix x1 month then stopped ASA.  - Continue current plavix, coumadin, heparin. Hold asa for now.   Blood loss anemia - 2U PRBC pm 06/11/2018 - Hgb 8.0  7.3  8.8  8.5  8.2  8.5 - RBC 2.74   3.19; plts 274 - Continue to follow Hgb with recommendation to transfuse for Hgb <8  Mechanical Ao Valve - Coumadin resumed 06/12/18.  - Continue coumadin and discharge when INR  therapeutic.  PVCs, PACs - Asx - Electrolytes: K 3.9  4.5  4.2  3.7; (9/9) Mg 2.3 - Daily BMP. Continue to monitor electrolytes   Hematuria - Reported earlier this admission. None since that time. - Continue to monitor for s/sx of bleeding with antiplatelet / anticoagulation therapy - Daily CBC, INR 1.92.  Hypertension - Stable - BP 99-110 systolic /36-59 diastolic; HR 22-97. - Continue to monitor vitals.  Episgastric LUQ pain, constipation with elevated lipase / amylase - GI & Surgery consulted.  - Per 9/13 formal consultation note suggests improving gallstone pancreatitis / cholelithiasis, constipation with plan for surgical consultation and consideration of cholecystectomy.  9/15 GI note states no further GI work-up anticipated. 9/16 Surgery note states patient will undergo surgery once safe to stop anticoagulation but as just had stent, will anticoagulate first per acc guidelines.   For questions or updates, please contact [facility] Please consult [email or web address] for contact info under      Signed, Lennon Alstrom, PA-C  06/17/2018, 10:20 AM     I have personally seen and examined this patient. I agree with the assessment and plan as outlined above.  She is stable this am. Plans of surgical team and GI team reviewed. VVS states pt is ready for discharge from vascular standpoint. Will continue to await therapeutic INR prior to discharge.   Verne Carrow 06/17/2018 11:39 AM

## 2018-06-17 NOTE — Progress Notes (Signed)
6 Days Post-Op    CC:  Gallstones with pancreatitis  Subjective: No abdominal pain, tolerating hearthealthy diet.  No current issue with pancreatitis.  Objective: Vital signs in last 24 hours: Temp:  [97.8 F (36.6 C)-98.2 F (36.8 C)] 97.8 F (36.6 C) (09/16 0445) Resp:  [12-20] 20 (09/16 0445) BP: (99-110)/(36-59) 110/42 (09/16 0445) SpO2:  [100 %] 100 % (09/16 0445) Weight:  [79 kg] 79 kg (09/16 0445) Last BM Date: 06/15/18 100 PO recorded 1400 IV Urine x 1 Afebrile, BP 98-140's sats good on room air WBC 8.0 H/H stable LFT's/lipase normal 06/14/18   Intake/Output from previous day: 09/15 0701 - 09/16 0700 In: 1562.5 [P.O.:100; I.V.:1462.5] Out: -  Intake/Output this shift: No intake/output data recorded.  General appearance: alert, cooperative and no distress GI: no tenderness except on the left side near the aneurysm repain.    Lab Results:  Recent Labs    06/15/18 0417 06/16/18 0501  WBC 9.3 8.0  HGB 8.2* 8.5*  HCT 25.7* 27.2*  PLT 265 274    BMET Recent Labs    06/14/18 1319 06/15/18 0417  NA 141 140  K 3.8 3.7  CL 108 108  CO2 25 26  GLUCOSE 153* 91  BUN 7* 10  CREATININE 0.76 0.73  CALCIUM 8.6* 8.2*   PT/INR Recent Labs    06/16/18 0501 06/17/18 0717  LABPROT 20.6* 21.8*  INR 1.78 1.92    Recent Labs  Lab 06/12/18 0333 06/14/18 1319 06/15/18 0417  AST 53* 22 18  ALT 98* 51* 40  ALKPHOS 193* 152* 135*  BILITOT 1.0 1.1 1.0  PROT 4.8* 5.0* 4.4*  ALBUMIN 2.3* 2.4* 2.1*     Lipase     Component Value Date/Time   LIPASE 31 06/14/2018 1319     Medications: . sodium chloride   Intravenous Once  . sodium chloride   Intravenous Once  . atorvastatin  80 mg Oral q1800  . clopidogrel  75 mg Oral Q breakfast  . docusate sodium  100 mg Oral Daily  . famotidine  20 mg Oral Daily  . pantoprazole  40 mg Oral Daily  . polyethylene glycol  17 g Oral BID  . warfarin  6 mg Oral ONCE-1800  . Warfarin - Pharmacist Dosing Inpatient    Does not apply q1800   . sodium chloride 50 mL/hr at 06/16/18 2127  . heparin 950 Units/hr (06/16/18 2129)  . lactated ringers 10 mL/hr at 06/11/18 1431   Anti-infectives (From admission, onward)   Start     Dose/Rate Route Frequency Ordered Stop   06/12/18 0600  cefUROXime (ZINACEF) 1.5 g in sodium chloride 0.9 % 100 mL IVPB     1.5 g 200 mL/hr over 30 Minutes Intravenous To Short Stay 06/11/18 1142 06/11/18 1534   06/09/18 1145  ceFAZolin (ANCEF) IVPB 2g/100 mL premix     2 g 200 mL/hr over 30 Minutes Intravenous Every 8 hours 06/09/18 1144 06/09/18 2101   06/09/18 0708  ceFAZolin (ANCEF) 2-4 GM/100ML-% IVPB    Note to Pharmacy:  Merril Abbe   : cabinet override      06/09/18 0708 06/09/18 1300   06/09/18 0000  ceFAZolin (ANCEF) IVPB 1 g/50 mL premix    Note to Pharmacy:  Send with pt to OR   1 g 100 mL/hr over 30 Minutes Intravenous On call 06/08/18 1231 06/10/18 0000   06/07/18 1400  cefTRIAXone (ROCEPHIN) 1 g in sodium chloride 0.9 % 100 mL IVPB  1 g 200 mL/hr over 30 Minutes Intravenous  Once 06/07/18 1347 06/07/18 1526      Assessment/Plan  Gallstone pancreatitis 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  Her pancreatitis has resolved and she is tolerating diet.  She could be considered for surgery when cardiology thinks it's safe to take off anticoagulation long enough to do surgery, and then reanticoagulate after procedure.          LOS: 8 days    [name] 06/17/2018 [phone]

## 2018-06-17 NOTE — Progress Notes (Signed)
Nursing staff encouraged patient to get up to chair and/or ambulate. Patient declines at this time will monitor patient [name], Randall An RN

## 2018-06-17 NOTE — Progress Notes (Signed)
Occupational Therapy Treatment Patient Details Name: [name] MRN: [id number] DOB: [birth year] Today's Date: 06/17/2018    History of present illness Pt is a 67 yo female admitted 06/07/18 with post cath groin concerns found to have pseudoaneurysm by ultrasound. 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 Pt with persistent bleeding from L groin incision s/p exploration of wound and drain placement 9/10. PHM includes CAD s/p CABG, HTN, NSTEMI, RA, severe anemia, AVR.   OT comments  Pt seen today to address LE dressing difficulties. Pt doing much better with use of a step stool. Pt is not interested in purchasing adaptive equipment and feel she will not need it in 5 days. Pt completes all basic adls with modified independence with occasional assist for LE dressing only.  Pt should be safe from adl standpoint at home.  No further acute OT needs at this time.   Follow Up Recommendations  No OT follow up    Equipment Recommendations  None recommended by OT    Recommendations for Other Services      Precautions / Restrictions Precautions Precautions: Fall Restrictions Weight Bearing Restrictions: No       Mobility Bed Mobility Overal bed mobility: Modified Independent                Transfers Overall transfer level: Independent Equipment used: None Transfers: Sit to/from Stand Sit to Stand: Independent         General transfer comment: No physical assist needed. Pt up in room with use of IV pole.    Balance Overall balance assessment: No apparent balance deficits (not formally assessed) Sitting-balance support: No upper extremity supported;Feet supported Sitting balance-Leahy Scale: Good     Standing balance support: No upper extremity supported;During functional activity Standing balance-Leahy Scale: Good Standing balance comment: Can static stand with no UE support                           ADL either performed or assessed with clinical  judgement   ADL Overall ADL's : Needs assistance/impaired Eating/Feeding: Independent;Sitting   Grooming: Independent;Standing   Upper Body Bathing: Independent;Sitting   Lower Body Bathing: Modified independent;Sit to/from stand Lower Body Bathing Details (indicate cue type and reason): Pt does not want to use AE to dress self. Pt could donn and doff socks with diffculty using a step stool this am. Pt can donn pants without assist.  Slip on shoes will be easiest at home. Upper Body Dressing : Independent;Sitting   Lower Body Dressing: Modified independent;Sit to/from stand Lower Body Dressing Details (indicate cue type and reason): Pt able to dress without AE although difficult. Pt not interested in purchasing equipment. Toilet Transfer: Modified Independent;Comfort height toilet;Grab bars   Toileting- Clothing Manipulation and Hygiene: Modified independent;Sit to/from stand       Functional mobility during ADLs: Modified independent General ADL Comments: Pt doing very well with adls.  Occasional assist with LE adls due to pain only.     Vision   Vision Assessment?: No apparent visual deficits   Perception     Praxis      Cognition Arousal/Alertness: Awake/alert Behavior During Therapy: WFL for tasks assessed/performed Overall Cognitive Status: Within Functional Limits for tasks assessed                                 General Comments: Pt mobilizing in room without assist, toileting without  assist and grooming at sink without assist.        Exercises     Shoulder Instructions       General Comments no further acute OT needs.    Pertinent Vitals/ Pain       Pain Assessment: Faces Faces Pain Scale: Hurts little more Pain Location: L groin incision, headache Pain Descriptors / Indicators: Sore;Headache Pain Intervention(s): Limited activity within patient's tolerance;Monitored during session;Repositioned  Home Living                                           Prior Functioning/Environment              Frequency  Min 2X/week        Progress Toward Goals  OT Goals(current goals can now be found in the care plan section)  Progress towards OT goals: Goals met/education completed, patient discharged from OT  Acute Rehab OT Goals Patient Stated Goal: "Keep my incisions from breaking open" OT Goal Formulation: With patient Time For Goal Achievement: 06/24/18 Potential to Achieve Goals: Good ADL Goals Pt Will Perform Lower Body Bathing: with modified independence;with adaptive equipment;sit to/from stand Pt Will Perform Lower Body Dressing: with modified independence;with adaptive equipment;sit to/from stand Pt Will Transfer to Toilet: with modified independence;ambulating;regular height toilet;grab bars Pt Will Perform Toileting - Clothing Manipulation and hygiene: with modified independence;sit to/from stand Pt Will Perform Tub/Shower Transfer: Tub transfer;with modified independence;ambulating;rolling walker  Plan Discharge plan remains appropriate    Co-evaluation                 AM-PAC PT "6 Clicks" Daily Activity     Outcome Measure   Help from another person eating meals?: None Help from another person taking care of personal grooming?: None Help from another person toileting, which includes using toliet, bedpan, or urinal?: None Help from another person bathing (including washing, rinsing, drying)?: None Help from another person to put on and taking off regular upper body clothing?: None Help from another person to put on and taking off regular lower body clothing?: A Little 6 Click Score: 23    End of Session Equipment Utilized During Treatment: Rolling walker  OT Visit Diagnosis: Pain Pain - Right/Left: Left Pain - part of body: Leg   Activity Tolerance Patient tolerated treatment well   Patient Left in bed;with call bell/phone within reach   Nurse Communication Mobility  status        Time: 4360-6770 OT Time Calculation (min): 10 min  Charges: OT General Charges $OT Visit: 1 Visit OT Treatments $Self Care/Home Management : 8-22 mins  Jinger Neighbors, OTR/L [phone]   Glenford Peers 06/17/2018, 9:17 AM

## 2018-06-18 ENCOUNTER — Telehealth: Payer: Self-pay | Admitting: Physician Assistant

## 2018-06-18 ENCOUNTER — Telehealth: Payer: Self-pay | Admitting: Vascular Surgery

## 2018-06-18 DIAGNOSIS — Z952 Presence of prosthetic heart valve: Secondary | ICD-10-CM

## 2018-06-18 LAB — CBC
HEMATOCRIT: 25.3 % — AB (ref 36.0–46.0)
HEMOGLOBIN: 8.1 g/dL — AB (ref 12.0–15.0)
MCH: 27.2 pg (ref 26.0–34.0)
MCHC: 32 g/dL (ref 30.0–36.0)
MCV: 84.9 fL (ref 78.0–100.0)
Platelets: 259 10*3/uL (ref 150–400)
RBC: 2.98 MIL/uL — ABNORMAL LOW (ref 3.87–5.11)
RDW: 18.4 % — ABNORMAL HIGH (ref 11.5–15.5)
WBC: 5.8 10*3/uL (ref 4.0–10.5)

## 2018-06-18 LAB — PROTIME-INR
INR: 2.19
Prothrombin Time: 24.2 seconds — ABNORMAL HIGH (ref 11.4–15.2)

## 2018-06-18 LAB — HEPARIN LEVEL (UNFRACTIONATED): HEPARIN UNFRACTIONATED: 0.28 [IU]/mL — AB (ref 0.30–0.70)

## 2018-06-18 NOTE — Progress Notes (Signed)
   VASCULAR SURGERY ASSESSMENT & PLAN:   STATUS POST REPAIR OF LEFT FEMORAL ARTERY PSEUDOANEURYSM: She has a palpable left dorsalis pedis pulse.  Her groin looks good.  I will arrange follow-up in 2 to 3 weeks.  Her INR is therapeutic I suspect she will be discharged today.  SUBJECTIVE:   Anxious to go home  PHYSICAL EXAM:   Vitals:   06/17/18 1707 06/17/18 1936 06/17/18 2338 06/18/18 0414  BP: (!) 120/45 (!) 123/41 (!) 103/48 (!) 120/51  Pulse:  72 76 73  Resp: (!) 21 20 20 20   Temp:  98.3 F (36.8 C) 98.2 F (36.8 C) 97.8 F (36.6 C)  TempSrc:  Oral Oral Oral  SpO2:  96% 99% 100%  Weight:    79 kg  Height:       Her left groin incision looks fine. Palpable left dorsalis pedis pulse.  LABS:   Lab Results  Component Value Date   WBC 5.8 06/18/2018   HGB 8.1 (L) 06/18/2018   HCT 25.3 (L) 06/18/2018   MCV 84.9 06/18/2018   PLT 259 06/18/2018   Lab Results  Component Value Date   CREATININE 0.73 06/15/2018   Lab Results  Component Value Date   INR 2.19 06/18/2018    PROBLEM LIST:    Active Problems:   Pseudoaneurysm (HCC)   CURRENT MEDS:   . sodium chloride   Intravenous Once  . sodium chloride   Intravenous Once  . atorvastatin  80 mg Oral q1800  . clopidogrel  75 mg Oral Q breakfast  . docusate sodium  100 mg Oral Daily  . famotidine  20 mg Oral Daily  . pantoprazole  40 mg Oral Daily  . polyethylene glycol  17 g Oral BID  . Warfarin - Pharmacist Dosing Inpatient   Does not apply q1800    06/20/2018 Beeper: [name] Office: [phone] 06/18/2018

## 2018-06-18 NOTE — Telephone Encounter (Signed)
sch appt lvm 07/10/18 330pm p/o MD

## 2018-06-18 NOTE — Care Management Note (Signed)
Case Management Note Donn Pierini RN, BSN [address]- RN Care Coordinator  [phone]  Patient Details  Name: [name] MRN: [id number] Date of Birth: 08/20/1951  Subjective/Objective:  Pt admitted with pseudoaneurysm s/p repair per vascular                   Action/Plan: PTA pt lived at home, independent, per previous CM note choice offered for HHPT- referral given to [facility] per pt choice however no HH orders written for discharge. Spoke with pt at bedside per pt she states that her right leg mobilizing close to baseline, left leg still has some swelling in it making mobilizing more difficult. She reports ambulating on her own and doing her own ADLs, pt at this time does not feel like she needs [facility] services and wants to see how she does at home and f/u with MD at her f/u appointment later this week if she feels like she needs HH. Pt declines any DME needs or walker. CM has notified Lupita Leash with [facility] that no HH will be needed at this time.   Expected Discharge Date:  06/18/18               Expected Discharge Plan:  Home w Home Health Services  In-House Referral:     Discharge planning Services  CM Consult  Post Acute Care Choice:  Home Health Choice offered to:  Patient  DME Arranged:    DME Agency:     HH Arranged:  PT, Patient Refused HH HH Agency:  [facility]  Status of Service:  Completed, signed off  If discussed at Long Length of Stay Meetings, dates discussed:    Discharge Disposition: home/self care   Additional Comments:  Darrold Span, RN 06/18/2018, 1:20 PM

## 2018-06-18 NOTE — Discharge Instructions (Signed)
Low-Fat Diet for Pancreatitis or Gallbladder Conditions A low-fat diet can be helpful if you have pancreatitis or a gallbladder condition. With these conditions, your pancreas and gallbladder have trouble digesting fats. A healthy eating plan with less fat will help rest your pancreas and gallbladder and reduce your symptoms. What do I need to know about this diet?  Eat a low-fat diet. ? Reduce your fat intake to less than 20-30% of your total daily calories. This is less than 50-60 g of fat per day. ? Remember that you need some fat in your diet. Ask your dietician what your daily goal should be. ? Choose nonfat and low-fat healthy foods. Look for the words "nonfat," "low fat," or "fat free." ? As a guide, look on the label and choose foods with less than 3 g of fat per serving. Eat only one serving.  Avoid alcohol.  Do not smoke. If you need help quitting, talk with your health care provider.  Eat small frequent meals instead of three large heavy meals. What foods can I eat? Grains Include healthy grains and starches such as potatoes, wheat bread, fiber-rich cereal, and brown rice. Choose whole grain options whenever possible. In adults, whole grains should account for 45-65% of your daily calories. Fruits and Vegetables Eat plenty of fruits and vegetables. Fresh fruits and vegetables add fiber to your diet. Meats and Other Protein Sources Eat lean meat such as chicken and pork. Trim any fat off of meat before cooking it. Eggs, fish, and beans are other sources of protein. In adults, these foods should account for 10-35% of your daily calories. Dairy Choose low-fat milk and dairy options. Dairy includes fat and protein, as well as calcium. Fats and Oils Limit high-fat foods such as fried foods, sweets, baked goods, sugary drinks. Other Creamy sauces and condiments, such as mayonnaise, can add extra fat. Think about whether or not you need to use them, or use smaller amounts or low fat  options. What foods are not recommended?  High fat foods, such as: ? Baked goods. ? Ice cream. ? French toast. ? Sweet rolls. ? Pizza. ? Cheese bread. ? Foods covered with batter, butter, creamy sauces, or cheese. ? Fried foods. ? Sugary drinks and desserts.  Foods that cause gas or bloating This information is not intended to replace advice given to you by your health care provider. Make sure you discuss any questions you have with your health care provider. Document Released: 09/23/2013 Document Revised: 02/24/2016 Document Reviewed: 09/01/2013 Elsevier Interactive Patient Education  2017 [employer]  

## 2018-06-18 NOTE — Progress Notes (Signed)
DC instructions reviewed with patient and she agrees to all follow ups and appts. She denies questions or concerns and is discharged with her daughter via wheelchair to family vehicle in stable condition.

## 2018-06-18 NOTE — Progress Notes (Signed)
Pt's HR dropped to 39, and 38 this am while sleeping. HR did not sustain. No s/s of distress, breathing even and unlabored in RA. Call light within reach. Will continue to monitor.

## 2018-06-18 NOTE — Telephone Encounter (Signed)
Follow Up:    Please call concerning form that she faxed on 06-17-18.

## 2018-06-18 NOTE — Plan of Care (Signed)
?  Problem: Health Behavior/Discharge Planning: ?Goal: Ability to manage health-related needs will improve ?Outcome: Adequate for Discharge ?  ?Problem: Clinical Measurements: ?Goal: Ability to maintain clinical measurements within normal limits will improve ?Outcome: Adequate for Discharge ?Goal: Will remain free from infection ?Outcome: Adequate for Discharge ?Goal: Diagnostic test results will improve ?Outcome: Adequate for Discharge ?Goal: Respiratory complications will improve ?Outcome: Adequate for Discharge ?Goal: Cardiovascular complication will be avoided ?Outcome: Adequate for Discharge ?  ?

## 2018-06-18 NOTE — Progress Notes (Signed)
Pt's left upper extrimity was swollen when assessed at shift change. Left AC iv was d/ced at shift change bedside report. Arm elevated in pillow. Explained pt about need to restart new iv site, pt refused. Pt stated its been swollen since surgery and its not bothering her nor painful. She said she is going home in the morning and everything can come off tomorrow. Heparin running at 9.5 ml/hr and NS at [facility]. IV insertion site has no redness or burning sensation. Swelling to left upper arm is coming down. LUE elevated, will continue to monitor.

## 2018-06-18 NOTE — Discharge Summary (Signed)
Discharge Summary    Patient ID: [name],  MRN: [id number], DOB/AGE: 06-20-1951 67 y.o.  Admit date: 06/07/2018 Discharge date: 06/18/2018  Primary Care Provider: [name] Primary Cardiologist: Dr. Rennis Golden   Discharge Diagnoses    Active Problems:   Pseudoaneurysm [facility])   History of mechanical aortic valve replacement  Allergies Allergies  Allergen Reactions  . Contrast Media [Iodinated Diagnostic Agents] Hives, Itching and Swelling    Diagnostic Studies/Procedures    Repair of the left femoral artery PSA _____________   History of Present Illness     [name] a 67 y.o.femalewith a hx of HTN, CAD, dilated ascending aorta at cath 11/03/12 s/p root andmechanicalAVR 11/05/12 andCAD status post with SVG to LAD and SVG to circumflex, complicated by pericardial effusion without tamponade requiring subxiphoid window, subclavian artery stenosis status post bypass in 2001,NSTEMIin 2017 with ischemic cardiomyopathy with an EF of 40 to 45%, heart cath resulting in PCI of the SVG to LAD, EF normalized in 2018.She also has a history of sarcoidosis dyslipidemia, and contrast allergy seen in the office on 06/07/18 with post cath groin concerns found to have pseudoaneurysm by ultrasound.   Hospital Course     Consultants: VVS, CCS  Pseudoaneurysm, left groin, post-cath site - Seen by vascular, Dr. Edilia Bo. OR 9/8, 9/10 for exploration and repair of L femoral artery pseudoaneurysm.  - Started Coumadin 06/12/18. - 9/17 INR 2.1  - She was resumed on her home dose of coumadin, as well as plavix. No ASA.  - follow up arranged per VVS  CAD s/p CABG, PCI with most recent 05/20/2018  - Discharged previously on triple therapy: Coumadin, Asa, Plavix x1 month then stopped ASA.  - Continue current plavix, coumadin. No ASA  Blood loss anemia - 2U PRBC pm 06/11/2018 - Hgb 8.0  7.3  8.8  8.5  8.2  8.1 - RBC 2.74   2.98; plts 259 - CBC at follow up appt  Mechanical Ao  Valve - Coumadin resumed 06/12/18.  - INR 2.1 on the day of discharge  PVCs, PACs - Asx - Electrolytes: K 3.9  4.5  4.2  3.7; (9/9) Mg 2.3  Hematuria - Reported earlier this admission, but resolved.   Hypertension - Stable - BP 99-110 systolic /36-59 diastolic; HR 29-52.  Episgastric LUQ pain, constipation with elevated lipase / amylase - GI & Surgery consulted.  - Per 9/13 formal consultation note suggested improving gallstone pancreatitis / cholelithiasis, constipation with plan for surgical consultation and consideration of cholecystectomy.  9/15 GI note stated no further GI work-up anticipated. 9/16 Surgery note statef patient will undergo surgery once safe to stop anticoagulation but as just had stent, will anticoagulate first per acc guidelines. Follow up for outpatient planned for 6-8 weeks.   General: Well developed, well nourished, female appearing in no acute distress. Head: Normocephalic, atraumatic.  Neck: Supple without bruits, JVD. Lungs:  Resp regular and unlabored, CTA. Heart: RRR with mechanical valve click, S1, S2, systolic murmur; no rub. Abdomen: Soft, non-tender, non-distended with normoactive bowel sounds. Extremities: No clubbing, cyanosis, edema. Distal pedal pulses are 2+ bilaterally.  Neuro: Alert and oriented X 3. Moves all extremities spontaneously. Psych: Normal affect.  [name] was seen by Dr. Clifton James and determined stable for discharge home. Follow up in the office has been arranged. Medications are listed below.   _____________  Discharge Vitals Blood pressure 91/62, pulse 73, temperature 97.8 F (36.6 C), temperature source Oral, resp. rate 19, height 5' 7.01" (1.702  m), weight 79 kg, SpO2 100 %.  Filed Weights   06/16/18 0456 06/17/18 0445 06/18/18 0414  Weight: 78.2 kg 79 kg 79 kg    Labs & Radiologic Studies    CBC Recent Labs    06/16/18 0501 06/18/18 0333  WBC 8.0 5.8  HGB 8.5* 8.1*  HCT 27.2* 25.3*  MCV 85.3 84.9    PLT 274 259   Basic Metabolic Panel No results for input(s): NA, K, CL, CO2, GLUCOSE, BUN, CREATININE, CALCIUM, MG, PHOS in the last 72 hours. Liver Function Tests No results for input(s): AST, ALT, ALKPHOS, BILITOT, PROT, ALBUMIN in the last 72 hours. No results for input(s): LIPASE, AMYLASE in the last 72 hours. Cardiac Enzymes No results for input(s): CKTOTAL, CKMB, CKMBINDEX, TROPONINI in the last 72 hours. BNP Invalid input(s): POCBNP D-Dimer No results for input(s): DDIMER in the last 72 hours. Hemoglobin A1C No results for input(s): HGBA1C in the last 72 hours. Fasting Lipid Panel No results for input(s): CHOL, HDL, LDLCALC, TRIG, CHOLHDL, LDLDIRECT in the last 72 hours. Thyroid Function Tests No results for input(s): TSH, T4TOTAL, T3FREE, THYROIDAB in the last 72 hours.  Invalid input(s): FREET3 _____________  Ct Abdomen Pelvis Wo Contrast  Result Date: 06/15/2018 CLINICAL DATA:  Acute pancreatitis. First episode. Unknown etiology. Prior left groin debridement for bleeding 4 days ago. Femoral femoral bypass graft 6 days ago. EXAM: CT ABDOMEN AND PELVIS WITHOUT CONTRAST TECHNIQUE: Multidetector CT imaging of the abdomen and pelvis was performed following the standard protocol without IV contrast. COMPARISON:  Plain films 06/09/2018. Abdominal ultrasound 06/09/2018. PET 07/21/2005 FINDINGS: Lower chest: Clear lung bases. Mild cardiomegaly, without pericardial or pleural effusion. Tortuous descending thoracic aorta. Prior median sternotomy. Hepatobiliary: Normal liver. Multiple dependent small gallstones. No evidence of acute cholecystitis. No biliary duct dilatation. Pancreas: Possible edema within the pancreatic head and neck, including on image 36/3. Is most apparent when compared to the prior PET of 07/21/2005. No pancreatic duct dilatation or peripancreatic fluid collection. Spleen: Normal in size, without focal abnormality. Adrenals/Urinary Tract: Normal adrenal glands. Right  extrarenal pelvis. No renal calculi or hydronephrosis. An upper pole left renal low-density lesion of 2.9 cm is likely a cyst. No hydroureter or ureteric calculi. No bladder calculi. Stomach/Bowel: Normal stomach, without wall thickening. Colonic stool burden suggests constipation. Scattered colonic diverticula. Normal terminal ileum. Normal small bowel. Vascular/Lymphatic: Aortic and branch vessel atherosclerosis. No abdominopelvic adenopathy. Reproductive: Normal uterus and adnexa. Other: No significant free fluid. Extensive edema centered about the left groin with ill-defined hyperattenuating material/hemorrhage and minimal gas. Musculoskeletal: Lumbosacral spondylosis. Minimal motion degradation in the upper abdomen with mild artifact from EKG wire and lead. IMPRESSION: 1. Mild degradation secondary to motion, minimal artifact, and lack of IV contrast. 2. Subtle pancreatic head and neck edema suspected, especially given the clinical history. Limited evaluation for choledocholithiasis or pancreas divisum secondary to technique. 3. Cholelithiasis. 4.  Possible constipation. 5. Hemorrhage and small volume gas in the left groin, consistent with the clinical history. 6.  Possible constipation.   Aortic Atherosclerosis (ICD10-I70.0). Electronically Signed   By: Jeronimo Greaves M.D.   On: 06/15/2018 00:05   Dg Chest 2 View  Result Date: 06/07/2018 CLINICAL DATA:  Pt had a catheterization of her left groin x 2 weeks ago, and x 1 week has noted increasing swelling, pain and ocasional bleeding to the area of insertion EXAM: CHEST - 2 VIEW COMPARISON:  05/16/2018 FINDINGS: Stable changes from prior cardiac surgery. Cardiac silhouette is mildly enlarged. No mediastinal or  hilar masses. No evidence of adenopathy. Diffuse aortic ectasia and atherosclerosis. Clear lungs.  No pleural effusion or pneumothorax. Skeletal structures are intact. IMPRESSION: No active cardiopulmonary disease. Electronically Signed   By: Amie Portland  M.D.   On: 06/07/2018 16:27   Dg Abd 1 View  Result Date: 06/09/2018 CLINICAL DATA:  Abdominal pain. EXAM: ABDOMEN - 1 VIEW COMPARISON:  None. FINDINGS: Gas and stool throughout the colon. No small or large bowel distention. Calcifications in the upper abdomen medially may represent adrenal gland calcifications. No radiopaque stones. Vascular calcifications. Degenerative changes in the spine. Soft tissue contours appear intact. IMPRESSION: Nonobstructive bowel gas pattern.  Diffusely stool-filled colon. Electronically Signed   By: Burman Nieves M.D.   On: 06/09/2018 02:50   US Abdomen Complete  Result Date: 06/09/2018 CLINICAL DATA:  Abdominal pain radiating to the right side. EXAM: ABDOMEN ULTRASOUND COMPLETE COMPARISON:  None. FINDINGS: Gallbladder: Multiple small stones and sludge layering in the dependent gallbladder. Largest stone measuring about 4 mm. No gallbladder wall thickening. Murphy's sign is negative. Common bile duct: Diameter: 6 mm, normal Liver: No focal lesion identified. Within normal limits in parenchymal echogenicity. Portal vein is patent on color Doppler imaging with normal direction of blood flow towards the liver. IVC: No abnormality visualized. Pancreas: Limited visualization due to bowel gas. Spleen: Size and appearance within normal limits. Right Kidney: Length: 13.1 cm. Mild right hydronephrosis. Cause is indeterminate. Normal parenchymal echotexture and thickness. No focal lesions identified. Left Kidney: Length: 10.4 cm. Echogenicity within normal limits. No mass or hydronephrosis visualized. Abdominal aorta: No aneurysm visualized. Other findings: None. IMPRESSION: 1. Small stones and sludge in the gallbladder. No additional changes to suggest cholecystitis. 2. Mild right renal hydronephrosis of uncertain etiology. Electronically Signed   By: Burman Nieves M.D.   On: 06/09/2018 02:48   Disposition   Pt is being discharged home today in good condition.  Follow-up  Plans & Appointments    Follow-up Information    Axel Filler, MD. Call in 2 month(s).   Specialty:  General Surgery Why:  Call to arrange follow up with surgeon to discuss having your gallbladder removed Contact information: [address] [place] [address] [phone]        Chuck Hint, MD Follow up in 2 week(s).   Specialties:  Vascular Surgery, Cardiology Why:  office will call Contact information: [address] [place] [address] [phone]        [facility] Follow up on 06/21/2018.   Specialty:  Cardiology Why:  at 2pm for your carotid dopplers, and 3pm for your coumadin follow up Contact information: [address] [place] [address] [phone]       Marcelino Duster, [place] Follow up on 06/24/2018.   Specialties:  Physician Assistant, Cardiology, Radiology Why:  at 11:30am for your follow up appt.  Contact information: [address] [place] [address] [phone]          Discharge Instructions    Call MD for:  redness, tenderness, or signs of infection (pain, swelling, redness, odor or green/yellow discharge around incision site)   Complete by:  As directed    Diet - low sodium heart healthy   Complete by:  As directed    Increase activity slowly   Complete by:  As directed       Discharge Medications     Medication List    STOP taking these medications   aspirin  81 MG EC tablet   diazepam 2 MG tablet Commonly known as:  VALIUM   enoxaparin 80 MG/0.8ML injection Commonly known as:  LOVENOX   fluticasone 50 MCG/ACT nasal spray Commonly known as:  FLONASE   lisinopril 2.5 MG tablet Commonly known as:  PRINIVIL,ZESTRIL     TAKE these medications   acetaminophen 500 MG tablet Commonly known as:  TYLENOL Take 500-1,000 mg by mouth every 6 (six) hours as needed (pain).   ARTIFICIAL TEARS 1.4 % ophthalmic solution Generic drug:  polyvinyl  alcohol Place 1-2 drops into both eyes daily as needed for dry eyes.   atorvastatin 80 MG tablet Commonly known as:  LIPITOR TAKE 1 TABLET (80 MG TOTAL) BY MOUTH DAILY AT 6 PM.   CALCIUM 600+D3 PO Take 1 tablet by mouth daily.   clopidogrel 75 MG tablet Commonly known as:  PLAVIX TAKE 1 TABLET BY MOUTH DAILY WITH BREAKFAST What changed:  when to take this   Diclofenac Sodium 3 % Gel Place 1 application onto the skin 2 (two) times daily. To affected area. What changed:    when to take this  reasons to take this  additional instructions   isosorbide mononitrate 30 MG 24 hr tablet Commonly known as:  IMDUR TAKE 1 TABLET BY MOUTH EVERY DAY   multivitamin with minerals Tabs tablet Take 1 tablet by mouth daily.   nitroGLYCERIN 0.4 MG SL tablet Commonly known as:  NITROSTAT PLACE 1 TABLET (0.4 MG TOTAL) UNDER THE TONGUE EVERY 5 (FIVE) MINUTES AS NEEDED FOR CHEST PAIN.   ondansetron 4 MG tablet Commonly known as:  ZOFRAN Take 1 tablet (4 mg total) by mouth every 6 (six) hours. What changed:    how much to take  when to take this   pantoprazole 40 MG tablet Commonly known as:  PROTONIX TAKE 1 TABLET BY MOUTH EVERY DAY   warfarin 5 MG tablet Commonly known as:  COUMADIN Take as directed. If you are unsure how to take this medication, talk to your nurse or doctor. Original instructions:  TAKE 1/2 TO 1 TABLET BY MOUTH DAILY OR AS DIRECTED BY COUMADIN CLINIC What changed:  See the new instructions.        Acute coronary syndrome (MI, NSTEMI, STEMI, etc) this admission?: No.     Outstanding Labs/Studies   INR check on 06/21/18  Duration of Discharge Encounter   Greater than 30 minutes including physician time.  Signed, Laverda Page NP-C 06/18/2018, 1:02 PM   I have personally seen and examined this patient. I agree with the assessment and plan as outlined above.  INR is therapeutic. Other issues stable. Discharge home today.   Verne Carrow 06/18/2018 1:02 PM'

## 2018-06-18 NOTE — Progress Notes (Signed)
ANTICOAGULATION CONSULT NOTE   Pharmacy Consult for heparin Indication: Mechanical AVR  Allergies  Allergen Reactions  . Contrast Media [Iodinated Diagnostic Agents] Hives, Itching and Swelling    Patient Measurements: Height: 5' 7.01" (170.2 cm) Weight: 174 lb 3.2 oz (79 kg) IBW/kg (Calculated) : 61.62 Heparin Dosing Weight: 70 kg  Vital Signs: Temp: 97.8 F (36.6 C) (09/17 0414) Temp Source: Oral (09/17 0414) BP: 120/51 (09/17 0414) Pulse Rate: 73 (09/17 0414)  Labs: Recent Labs    06/16/18 0501 06/17/18 0717 06/18/18 0333  HGB 8.5*  --  8.1*  HCT 27.2*  --  25.3*  PLT 274  --  259  LABPROT 20.6* 21.8* 24.2*  INR 1.78 1.92 2.19  HEPARINUNFRC 0.43 0.39 0.28*    Estimated Creatinine Clearance: 75.9 mL/min (by C-G formula based on SCr of 0.73 mg/dL).   Medical History: Past Medical History:  Diagnosis Date  . Ascending aortic aneurysm (HCC)    a.  redo replacement of fusiform aneurysm of ascending aorta (complicated by pericardial hematoma s/p evacuation) in 2014.  Marland Kitchen Contrast media allergy   . Coronary artery disease    a. s/p CABG x2 in 2014. b. s/p DES to SVG-LAD in 2017.  . H/O mechanical aortic valve replacement   . Hyperlipidemia   . Hypertension   . Ischemic cardiomyopathy    a. EF 40-45% in 2017 at time of NSTEMI, improved to normal in 2018.  . Non-STEMI (non-ST elevated myocardial infarction) (HCC)   . Premature atrial contractions   . PVC's (premature ventricular contractions)   . RA (rheumatoid arthritis) (HCC)   . S/P AVR (aortic valve replacement) 08/1999   [employer] AVR for aortic insuff, Goal INR 2.5-3.5  . 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 Donata Clay) - complicated by pericardial hematoma --> subxiphoid pericardial exposure and evacuation b. Cath 06/2016: DES to SVG-LAD ostium  . Sarcoid   . Second degree AV block    a. ? possibly seen in 12/2016 clinic  visit - EKG not truly suggestive  . Severe anemia   . 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 s/p left subclavian bypass.    Assessment:  52 YOF with a mechanical AVR on warfarin at home here with pseudoaneurysm near her PCI groin site now s/p repair with VVS 9/8. Repair complicated by significant bleeding and fall in hemoglobin. Pt now s/p re-exploration in OR with no source identified but hematoma washed out. Heparin and coumadin resumed 9/11.  Heparin level slightly below goal this morning. INR is now within range at 2.19. Hgb stable. No bleeding issues noted. Will stop heparin and expect discharge today.   Home warfarin dose = 2.5mg  Mon/Thurs; 5mg  all other days  Goal of Therapy:  INR 2-3 Heparin level 0.3-0.5 units/ml Monitor platelets by anticoagulation protocol: Yes   Plan/Recommendations at discharge Stop heparin now Would restart patient on his home warfarin dose at discharge -5mg  daily except 2.5mg  on Mondays and Thursdays -INR check next week  12-29-1972 PharmD., BCPS Clinical Pharmacist 06/18/2018 7:38 AM

## 2018-06-18 NOTE — Progress Notes (Signed)
[facility] Progress Note  7 Days Post-Op  Subjective: CC-  Sitting up in chair eating breakfast. Tolerating solid food. Denies any abdominal pain. No n/v.   Objective: Vital signs in last 24 hours: Temp:  [97.8 F (36.6 C)-98.3 F (36.8 C)] 97.8 F (36.6 C) (09/17 0414) Pulse Rate:  [49-76] 73 (09/17 0414) Resp:  [18-21] 20 (09/17 0414) BP: (103-132)/(41-51) 120/51 (09/17 0414) SpO2:  [91 %-100 %] 100 % (09/17 0414) Weight:  [79 kg] 79 kg (09/17 0414) Last BM Date: 06/17/18  Intake/Output from previous day: 09/16 0701 - 09/17 0700 In: 1720 [P.O.:560; I.V.:1160] Out: 800 [Urine:800] Intake/Output this shift: No intake/output data recorded.  PE: Gen:  Alert, NAD, pleasant HEENT: EOM's intact, pupils equal and round Pulm:  CTAB, no W/R/R, effort normal Abd: Soft, NT/ND, +BS Ext: calves soft and nontender Psych: A&Ox3  Skin: no rashes noted, warm and dry  Lab Results:  Recent Labs    06/16/18 0501 06/18/18 0333  WBC 8.0 5.8  HGB 8.5* 8.1*  HCT 27.2* 25.3*  PLT 274 259   BMET No results for input(s): NA, K, CL, CO2, GLUCOSE, BUN, CREATININE, CALCIUM in the last 72 hours. PT/INR Recent Labs    06/17/18 0717 06/18/18 0333  LABPROT 21.8* 24.2*  INR 1.92 2.19   CMP     Component Value Date/Time   NA 140 06/15/2018 0417   K 3.7 06/15/2018 0417   CL 108 06/15/2018 0417   CO2 26 06/15/2018 0417   GLUCOSE 91 06/15/2018 0417   BUN 10 06/15/2018 0417   CREATININE 0.73 06/15/2018 0417   CREATININE 0.77 07/14/2015 1615   CALCIUM 8.2 (L) 06/15/2018 0417   PROT 4.4 (L) 06/15/2018 0417   ALBUMIN 2.1 (L) 06/15/2018 0417   AST 18 06/15/2018 0417   ALT 40 06/15/2018 0417   ALKPHOS 135 (H) 06/15/2018 0417   BILITOT 1.0 06/15/2018 0417   GFRNONAA >60 06/15/2018 0417   GFRAA >60 06/15/2018 0417   Lipase     Component Value Date/Time   LIPASE 31 06/14/2018 1319       Studies/Results: No results found.  Anti-infectives: Anti-infectives  (From admission, onward)   Start     Dose/Rate Route Frequency Ordered Stop   06/12/18 0600  cefUROXime (ZINACEF) 1.5 g in sodium chloride 0.9 % 100 mL IVPB     1.5 g 200 mL/hr over 30 Minutes Intravenous To Short Stay 06/11/18 1142 06/11/18 1534   06/09/18 1145  ceFAZolin (ANCEF) IVPB 2g/100 mL premix     2 g 200 mL/hr over 30 Minutes Intravenous Every 8 hours 06/09/18 1144 06/09/18 2101   06/09/18 0708  ceFAZolin (ANCEF) 2-4 GM/100ML-% IVPB    Note to Pharmacy:  Merril Abbe   : cabinet override      06/09/18 0708 06/09/18 1300   06/09/18 0000  ceFAZolin (ANCEF) IVPB 1 g/50 mL premix    Note to Pharmacy:  Send with pt to OR   1 g 100 mL/hr over 30 Minutes Intravenous On call 06/08/18 1231 06/10/18 0000   06/07/18 1400  cefTRIAXone (ROCEPHIN) 1 g in sodium chloride 0.9 % 100 mL IVPB     1 g 200 mL/hr over 30 Minutes Intravenous  Once 06/07/18 1347 06/07/18 1526       Assessment/Plan Gallstone pancreatitis Left external ilia/proximal left common femoral artery pseudoaneurysm repair 06/09/18 CAD/Prior CABG/NSTEMI  - Cardiac cath 05/20/18 - 95% LM with Stent placement/IABP - Home 05/24/18 CM EF 40-45% S/P AVR replacement on chronic  anticoagulation (currently on heparin, coumadin and Plavix) Hypertension RA Sarcoid  Abdominal pain/cholelithiasis - (Pancreatitis - resolved)  FEN:  IV fluids/Heart healthy diet ID:  none current DVT:  coumadin INR 2.19 Follow up:  TBD  Plan: Patient stable for discharge from surgical standpoint. Continue low fat/heart healthy diet. F/u in office with Dr. Derrell Lolling 6-8 weeks to discuss cholecystectomy. We will sign off, call with concerns.   LOS: 9 days    Franne Forts , [facility] 06/18/2018, 8:23 AM Pager: [phone] Consults: [phone] Mon 7:00 am -11:30 AM Tues-Fri 7:00 am-4:30 pm Sat-Sun 7:00 am-11:30 am

## 2018-06-19 NOTE — Telephone Encounter (Signed)
Called [employer], they are resending the paperwork to be completed. Will attention it to Me, or Tee.

## 2018-06-21 ENCOUNTER — Ambulatory Visit (HOSPITAL_COMMUNITY)
Admission: RE | Admit: 2018-06-21 | Discharge: 2018-06-21 | Disposition: A | Discharge status: Home / Self Care | Payer: Commercial Managed Care - PPO | Source: Ambulatory Visit | Attending: Internal Medicine | Admitting: Internal Medicine

## 2018-06-21 ENCOUNTER — Ambulatory Visit (INDEPENDENT_AMBULATORY_CARE_PROVIDER_SITE_OTHER): Payer: Commercial Managed Care - PPO | Admitting: Pharmacist Clinician (PhC)/ Clinical Pharmacy Specialist

## 2018-06-21 ENCOUNTER — Encounter: Payer: Self-pay | Admitting: Physician Assistant

## 2018-06-21 DIAGNOSIS — I6523 Occlusion and stenosis of bilateral carotid arteries: Secondary | ICD-10-CM | POA: Insufficient documentation

## 2018-06-21 DIAGNOSIS — Z7901 Long term (current) use of anticoagulants: Secondary | ICD-10-CM

## 2018-06-21 DIAGNOSIS — D649 Anemia, unspecified: Secondary | ICD-10-CM | POA: Insufficient documentation

## 2018-06-21 DIAGNOSIS — Z952 Presence of prosthetic heart valve: Secondary | ICD-10-CM | POA: Diagnosis not present

## 2018-06-21 LAB — POCT INR: INR: 2.8 (ref 2.0–3.0)

## 2018-06-22 ENCOUNTER — Other Ambulatory Visit: Payer: Self-pay | Admitting: Internal Medicine

## 2018-06-23 NOTE — Progress Notes (Signed)
Cardiology Office Note:    Date:  06/24/2018   ID:  [name], DOB 24-Nov-1950, MRN [id number]  PCP:  [name], MD  Cardiologist:  Chrystie Nose, MD   Referring MD: [name], MD   Chief Complaint  Patient presents with  . Hospitalization Follow-up    pseudoaneurysm    History of Present Illness:    [name] is a 67 y.o. female with a hx of HTN, CAD, dilated ascending aorta at cath 11/03/12 s/p root andmechanicalAVR 11/05/12 andCAD status post with SVG to LAD and SVG to circumflex, complicated by pericardial effusion without tamponade requiring subxiphoid window, subclavian artery stenosis status post bypass in 2001,NSTEMIin 2017 with ischemic cardiomyopathy with an EF of 40 to 45%, heart cath resulting in PCI of the SVG to LAD, EF normalized in 2018.She also has a history of sarcoidosis, dyslipidemia, and contrast allergy. Beta-blocker had been stopped on her previous visit due to what appears to be second-degree heart block.  She presented to the ER on 05/16/2018 with anginal chest pain and an elevated troponin of 0.11.  Admission was recommended but she declined.  She returns for admission on 05/17/2018.  Heart cath on 05/20/2018 with restenosis of the SVG ostium to the LAD.  Unable to engage the graft, staged PCI was planned.  On 05/21/2018, the patient returned to the Cath Lab for PCI of her left main.  She was discharged with plans for Coumadin aspirin and Plavix for 1 month and then stopping aspirin.  She was seen in the office on 06/07/18 with post cath groin concerns found to havepseudoaneurysmby ultrasound.Of note, she was also found to have EKG changes and reported hematuria. She was seen in conjunction with Dr. Royann Shivers (DOD) and decision was made for hospital admission.   Vascular surgery was consulted for right groin pseudoaneurysm.  Complex situation since she is on Coumadin for her valve.  She was transitioned to heparin and underwent repair on 06/09/18 and  again on 06/11/18.  She received 2 units of PRBC during her admission per vascular surgery. During her hospitalization, she complained of left upper quadrant abdominal pain and epigastric pain.  Lipase and amylase were significantly elevated.  GI was consulted; however, labs had normalized by the next day and her pain was gone.  Coumadin was restarted and she was discharged on 06/18/2018.  She returns today for hospital follow-up.  Since being released from the hospital she has done well.  She is slowly recovering and improving her stamina.  She continues to have a large hematoma in her left groin.  I do not appreciate a bruit or pulsatile mass.  She follows up with vascular surgery in 2 weeks.  I will check her hemoglobin today.  Her pressure is marginal and she does complain of intermittent dizziness.  She kept taking 2.5 mg lisinopril after discharge even though it appears this was discontinued at discharge.  I will stop the lisinopril today.   Past Medical History:  Diagnosis Date  . Ascending aortic aneurysm (HCC)    a.  redo replacement of fusiform aneurysm of ascending aorta (complicated by pericardial hematoma s/p evacuation) in 2014.  Marland Kitchen Contrast media allergy   . Coronary artery disease    a. s/p CABG x2 in 2014. b. s/p DES to SVG-LAD in 2017.  . H/O mechanical aortic valve replacement   . Hyperlipidemia   . Hypertension   . Ischemic cardiomyopathy    a. EF 40-45% in 2017 at time of NSTEMI, improved  to normal in 2018.  . Non-STEMI (non-ST elevated myocardial infarction) (HCC)   . Premature atrial contractions   . PVC's (premature ventricular contractions)   . RA (rheumatoid arthritis) (HCC)   . S/P AVR (aortic valve replacement) 08/1999   [employer] AVR for aortic insuff, Goal INR 2.5-3.5  . 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 Van Trigt) - complicated by pericardial hematoma --> subxiphoid pericardial  exposure and evacuation b. Cath 06/2016: DES to SVG-LAD ostium  . Sarcoid   . Second degree AV block    a. ? possibly seen in 12/2016 clinic visit - EKG not truly suggestive  . Severe anemia   . 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 s/p left subclavian bypass.    Past Surgical History: [employer] Physiological sc4Lajoyce CornersPrudy FeVincenHansel Physiological sc2Lajoyce CornersPrudy FeVincente PKHansel Physiological sc9Lajoyce CornersPrudy FeHansel Physiological sc6Lajoyce CornersPrudy FeVincente PKeHansel Physiological sc9Lajoyce CornersPrudy FeVincente Hansel Physiological sc6Lajoyce CornersPrudy FeVincente PKeHansel Physiological [place](2Lajoyce CornersPrudy FeVincente PKentuHansel Physiological sc2Lajoyce CornersPrudy FeVincenHansel Physiological sc3Lajoyce CornersPrudy FeVincente PKentuckyoTexasStrategic BehHansel Physiological [place](64Lajoyce CornersPrudy FeVincenteHansel Physiological sc8Lajoyce CornersPrudy FeVincente PKentuckyHansel Physiological sc6Lajoyce CornersPrudyHansel Physiological [place](56Lajoyce CornersPrudy FeVincente PKentuckyoTexasPaHansel Physiological [place](97Lajoyce CornersPrudy FeVincente PKentuckyoTexasSurgical Specialty AssMiliMiNashviHansel Physiological [place](9Lajoyce CornersPrudy FeVincenteHansel Physiological sc2Lajoyce CornersPrudy FeVincente PKentuckyoTexHansel Physiological [place](84Lajoyce CornersPrudy FHansel Physiological sc4Lajoyce CornersPrudy FeVHansel Physiological sc9Lajoyce CornersPrudy FeVincente PKenHansel Physiological sc8Lajoyce CornersPrudy FeVincente PKentuckyoTexasNorthwest Florida SurgiHansel Physiological sc7Lajoyce CornersPrHansel Physiological sc7Lajoyce CornersPrudy FeVincente PKentuckyoTexasAmericHansel Physiological sc8Lajoyce CornersPrudy FeVincenteHansel Physiological [place](7Lajoyce CornersPrudy FHansel Physiological sc3Lajoyce CornersPrudy FeVincente PKentuckyoTexasMedical Heights Surgery CentHansel Physiological sc3Lajoyce CornersPrudy FeVincente PKentuckyoTHansel Physiological [place](6Lajoyce CornersPrudy FeVincente PKentuckyoTexasUc RegeHansel Physiological [place](3Lajoyce CornersPrudy FeVincente PKHansel Physiological sLajoyce CornersPrudy FeVincente PKentuckyoTexasThe TampaHansel Physiological sLajoyce CornersPrudy FeVincente PKentuckyoTexasSpectrum HHansel Physiological [place](9Lajoyce CornersPrHansel Physiological sc7Lajoyce CornersPrudy FeVinHansel Physiological [place](6Lajoyce CornersPrudy FeVincente PKentuckyoTexasMercy MedMiliMiTemecula Valley HospitaloftsntertykyoTexasMcallen HeaMiliMiMid Columbia Endoscopy Cente83m L416[place] O e: BYPASS GRAFT ANGIOGRAPHY;  Surgeon: Varanasi, Jayadeep S, MD;  [facility];  Service: Cardiovascular;;  . CARDIAC CATHETERIZATION  08/1999   normal L main, LAD & first diagonal are normal, normal Cfx, RCA with mild ostial narrowing, normal LV systolic function, severe AI, mild MR, normal PAP (Dr. A. Little)   . CARDIAC CATHETERIZATION N/A 06/22/2016   Procedure: Left Heart Cath and Cors/Grafts Angiography;  Surgeon: David W Harding, MD;  [facility];  Service: Cardiovascular;  Laterality: N/A;  . CARDIAC CATHETERIZATION N/A 06/23/2016   Procedure: Left Heart Cath and Coronary Angiography;  Surgeon: Michael Cooper, MD;  [facility];  Service: Cardiovascular;  Laterality: N/A;  . CARDIAC CATHETERIZATION N/A 06/23/2016   Procedure: Coronary Stent Intervention;  Surgeon: Michael Cooper, MD;  [facility];  Service: Cardiovascular;  Laterality: N/A;  . CORONARY ARTERY BYPASS GRAFT  11/05/2012   Procedure: CORONARY ARTERY BYPASS GRAFTING (CABG);  Surgeon: Peter Van Trigt, MD;  Location: [facility];  Service: Open Heart Surgery;  Laterality: N/A;  cannulate right subclavian  . CORONARY STENT INTERVENTION N/A 05/21/2018   Procedure: CORONARY STENT INTERVENTION;  Surgeon: Varanasi, Jayadeep S, MD;  [facility];  Service: Cardiovascular;  Laterality: N/A;  . ENDOVEIN HARVEST OF GREATER SAPHENOUS VEIN  11/05/2012   Procedure: ENDOVEIN HARVEST OF GREATER SAPHENOUS VEIN;   Surgeon: Peter Van Trigt, MD;  [facility];  Service: Open Heart Surgery;  Laterality: Right;  . FEMORAL-FEMORAL BYPASS GRAFT Left 06/09/2018   Procedure: REPAIR OF LEFT FEMORAL ARTERY PSEUDO ANEURYSM;  Surgeon: Dickson, Christopher S, MD;  [facility];  Service: Vascular;  Laterality: Left;  . GROIN DEBRIDEMENT Left 06/11/2018   Procedure: EXPLORATION LEFT GROIN FOR BLEEDING;  Surgeon: Dickson, Christopher S, MD;  [facility];  Service: Vascular;  Laterality: Left;  . IABP INSERTION N/A 05/21/2018   Procedure: IABP INSERTION;  Surgeon: Varanasi, Jayadeep S, MD;  [facility];  Service: Cardiovascular;  Laterality: N/A;  . INTRAOPERATIVE TRANSESOPHAGEAL ECHOCARDIOGRAM  11/05/2012   Procedure: INTRAOPERATIVE TRANSESOPHAGEAL ECHOCARDIOGRAM;  Surgeon: Peter Van Trigt, MD;  [facility];  Service: Open Heart Surgery;  Laterality: N/A;  . INTRAOPERATIVE TRANSESOPHAGEAL ECHOCARDIOGRAM N/A 11/21/2012   Procedure: INTRAOPERATIVE TRANSESOPHAGEAL ECHOCARDIOGRAM;  Surgeon: Peter Van Trigt, MD;  [facility];  Service: Open Heart Surgery;  Laterality: N/A;  . LEFT HEART CATHETERIZATION WITH CORONARY ANGIOGRAM  N/A 11/04/2012   Procedure: LEFT HEART CATHETERIZATION WITH CORONARY ANGIOGRAM;  Surgeon: Chrystie Nose, MD;  Location: [facility] CATH LAB;  Service: Cardiovascular;  Laterality: N/A;  . MEDIASTINOSCOPY  08/2005   bronchoscopy & mediastinoscopy for mediastinal adenopathy (Dr. Donata Clay)   . NM MYOCAR PERF WALL MOTION  01/2012   lexiscan; small, fixed apical septal breast attenuation artifact & inferolateral bowel attenuation, no reversible ischemia, post-stress EF 64%, low risk scan   . PERICARDIAL WINDOW N/A 11/21/2012   Procedure: PERICARDIAL WINDOW;  Surgeon: Kerin Perna, MD;  Location: [facility] OR;  Service: Thoracic;  Laterality: N/A;  . REPLACEMENT ASCENDING AORTA  11/05/2012   Procedure: REPLACEMENT ASCENDING AORTA;  Surgeon: Kerin Perna, MD;  Location: [facility] OR;  Service: Open  Heart Surgery;  Laterality: N/A;  . SUBCLAVIAN ANGIOGRAM  05/10/2000   left subclavian arteriogram; patent graft, mild stenosis (Dr. Amada Kingfisher)   . SUBCLAVIAN BYPASS GRAFT  05/16/1999   left subclavian bypass with 70mm Darcon graft (Dr. Amada Kingfisher)  . TRANSTHORACIC ECHOCARDIOGRAM  11/20/2012   EF 65-70%, mild conc LVH, grade 2 diastolic dysfunction, mech AV, mild MR    Current Medications: Current Meds  Medication Sig  . acetaminophen (TYLENOL) 500 MG tablet Take 500-1,000 mg by mouth every 6 (six) hours as needed (pain).  Marland Kitchen atorvastatin (LIPITOR) 80 MG tablet TAKE 1 TABLET (80 MG TOTAL) BY MOUTH DAILY AT 6 PM.  . Calcium Carb-Cholecalciferol (CALCIUM 600+D3 PO) Take 1 tablet by mouth daily.   . clopidogrel (PLAVIX) 75 MG tablet TAKE 1 TABLET BY MOUTH DAILY WITH BREAKFAST (Patient taking differently: Take 75 mg by mouth daily. )  . isosorbide mononitrate (IMDUR) 30 MG 24 hr tablet TAKE 1 TABLET BY MOUTH EVERY DAY (Patient taking differently: Take 30 mg by mouth daily. )  . Multiple Vitamin (MULTIVITAMIN WITH MINERALS) TABS tablet Take 1 tablet by mouth daily.  . nitroGLYCERIN (NITROSTAT) 0.4 MG SL tablet PLACE 1 TABLET (0.4 MG TOTAL) UNDER THE TONGUE EVERY 5 (FIVE) MINUTES AS NEEDED FOR CHEST PAIN.  Marland Kitchen pantoprazole (PROTONIX) 40 MG tablet TAKE 1 TABLET BY MOUTH EVERY DAY (Patient taking differently: Take 40 mg by mouth daily. )  . polyvinyl alcohol (ARTIFICIAL TEARS) 1.4 % ophthalmic solution Place 1-2 drops into both eyes daily as needed for dry eyes.  Marland Kitchen warfarin (COUMADIN) 5 MG tablet TAKE 1/2 TO 1 TABLET BY MOUTH DAILY OR AS DIRECTED BY COUMADIN CLINIC (Patient taking differently: Take 2.5-5 mg by mouth See admin instructions. Take 1/2 tablet (2.5 mg) by mouth on Monday and Thursday at 6pm, take 1 tablet (5 mg) on Sunday, Tuesday, Wednesday, Friday, Saturday at 6pm)  . [DISCONTINUED] Diclofenac Sodium 3 % GEL Place 1 application onto the skin 2 (two) times daily. To affected area. (Patient taking  differently: Place 1 application onto the skin 2 (two) times daily as needed (arthritis pain). )     Allergies:   Contrast media [iodinated diagnostic agents]   Social History   Socioeconomic History  . Marital status: Single    Spouse name: Not on file  . Number of children: 2  . Years of education: 63  . Highest education level: Not on file  Occupational History  . Occupation: Distribution center    Employer: [employer]  Social Needs  . Financial resource strain: Not on file  . Food insecurity:    Worry: Not on file    Inability: Not on file  . Transportation needs:    Medical: Not  on file    Non-medical: Not on file  Tobacco Use  . Smoking status: Never Smoker  . Smokeless tobacco: Never Used  Substance and Sexual Activity  . Alcohol use: No  . Drug use: No  . Sexual activity: Not on file  Lifestyle  . Physical activity:    Days per week: Not on file    Minutes per session: Not on file  . Stress: Not on file  Relationships  . Social connections:    Talks on phone: Not on file    Gets together: Not on file    Attends religious service: Not on file    Active member of club or organization: Not on file    Attends meetings of clubs or organizations: Not on file    Relationship status: Not on file  Other Topics Concern  . Not on file  Social History Narrative  . Not on file     Family History: The patient's family history includes Hypertension in her mother. There is no history of Colon cancer, Esophageal cancer, Pancreatic cancer, Stomach cancer, or Liver disease.  ROS:   Please see the history of present illness.     All other systems reviewed and are negative.  EKGs/Labs/Other Studies Reviewed:    The following studies were reviewed today:  Carotid dopplers 06/21/18: Final Interpretation: Right Carotid: Velocities in the right ICA are consistent with a 1-39% stenosis.  Left Carotid: Velocities in the left ICA are consistent with a 1-39%  stenosis.       Patent left common carotid to subclavian artery bypass graft with       elevated velocities as noted above.  Vertebrals: Right vertebral artery demonstrates antegrade flow. Left vertebral       artery demonstrates retrograde flow. Subclavians: Left subclavian artery was stenotic. Normal flow hemodynamics were       seen in the right subclavian artery.    L groin u/s 06/07/18:  An area with well defined borders measuring 2.6 cm x 2.9 cm was visualized arising off of the distal external/proximal common femoral artery with ultrasound characteristics of a pseudoaneurysm. The neck measures approximately 0.3 cm wide and 0.7 cm long.   Cardiac catheterization 05/20/2018:  Ost LM to LM lesion is 95% stenosed.  Ost LAD to Prox LAD lesion is 100% stenosed.  Ost 1st Mrg lesion is 90% stenosed.  Ost RCA to Prox RCA lesion is 100% stenosed. Not injected today.  SVG Y-graft to LAD and circumflex. Possible Origin lesion is 90% stenosed of the prior SVG stent from 2017.  Normal right heart pressures.  Ao sat 98%, PA sat 76%, CO 6.4 L/min; CI 3.5; mean PA 13 mm Hg; mean PCWP 8 mm Hg  Thoracic aortic tortuosity made catheter manipulation more difficult.  Chest pain at the conclusion of the procedure was relieved with IV NTG. Significant ST depressions when she had CP and increased BP . Will need close monitoring of BP.  Possible severe in-stent restenosis of 4.0 mm Synergy stent from 2017.Severe native disease including left main. There is a long segment of stent hanging outside of the graft, which makes it difficult to selectively engage the vessel and know if the portion of the stent inside the vessel is patent.  Prox LAD is occluded. Fixing native left main would not help anterior wall.   If Graft stent has severe restenosis once, and restenting may not give a different longterm result.   Need to know LV function. Check echo. Would  see if she is a redo candidate as LIMA was not used the last surgery.   Will check options for revascularization.   Higher contrast load due to difficulty finding grafts Recheck kidney function in AM.  Consider CT scan to see if we can better define patency of the stent.   Given her chest pain and ECG changes, I suspect there is significant disease in the stent causing her ischemia.   Cardiac catheterization 05/21/2018:  Ost LM lesion is 95% stenosed.  A drug-eluting stent was successfully placed using a STENT RESOLUTE ONYX 5.0X12.  Post intervention, there is a 0% residual stenosis.  IABP placed preprocedure for left main intervention.  Prox LAD lesion is 10% stenosed.   Recommend to resume Warfarin, at currently prescribed dose and frequency, on 05/21/18. Recommend concurrent antiplatelet therapy of Aspirin 81mg  daily for 1 monthand Clopidogrel 75mg  daily for indefinitely, as long as there are no bleeding problems.    EKG:  EKG is not ordered today.   Recent Labs: 05/17/2018: TSH 1.910 06/10/2018: Magnesium 2.3 06/15/2018: ALT 40; BUN 10; Creatinine, Ser 0.73; Potassium 3.7; Sodium 140 06/18/2018: Hemoglobin 8.1; Platelets 259  Recent Lipid Panel    Component Value Date/Time   CHOL 107 06/08/2018 0235   CHOL 134 02/22/2018 0843   CHOL 173 10/14/2013 0943   TRIG 82 06/08/2018 0235   TRIG 75 10/14/2013 0943   HDL 51 06/08/2018 0235   HDL 84 02/22/2018 0843   HDL 79 10/14/2013 0943   CHOLHDL 2.1 06/08/2018 0235   VLDL 16 06/08/2018 0235   LDLCALC 40 06/08/2018 0235   LDLCALC 42 02/22/2018 0843   LDLCALC 79 10/14/2013 0943    Physical Exam:    VS:  BP 110/64   Pulse 66   Ht 5\' 7"  (1.702 m)   Wt 160 lb 6.4 oz (72.8 kg)   SpO2 99%   BMI 25.12 kg/m     Wt Readings from Last 3 Encounters:  06/24/18 160 lb 6.4 oz (72.8 kg)  06/18/18 174 lb 3.2 oz (79 kg)  06/07/18 156 lb 6.4 oz (70.9 kg)     GEN: Well nourished, well developed in no acute  distress HEENT: Normal NECK: No JVD; No carotid bruits CARDIAC: RRR, no murmurs, rubs, gallops, crisp valve click RESPIRATORY:  Clear to auscultation without rales, wheezing or rhonchi  ABDOMEN: Soft, non-tender, non-distended MUSCULOSKELETAL:  No edema; No deformity  SKIN: Warm and dry NEUROLOGIC:  Alert and oriented x 3 PSYCHIATRIC:  Normal affect   ASSESSMENT:    1. Pseudoaneurysm (HCC)   2. Essential hypertension   3. Dilated ascending aorta at cath 11/03/12 - S/P root replacement 11/05/12   4. Cardiomyopathy, ischemic   5. Medication management   6. Chronic anticoagulation for mechanical AVR   7. NSTEMI (non-ST elevated myocardial infarction) (HCC)    PLAN:    In order of problems listed above:  Pseudoaneurysm [facility]) She presents for hospital follow-up.  Left groin pseudoaneurysm continues with hematoma.  No bruit and no pulsatile mass.  Follow-up with vascular surgery as scheduled.  I will collect a CBC today.  Continue Plavix and Coumadin.  No aspirin.  Essential hypertension - Plan: CBC Pressure is marginal today and she does report intermittent episodes of dizziness.  I will DC lisinopril 2.5 mg daily.  This appears to have been discontinued on her discharge paperwork, there may have been some confusion.  Dilated ascending aorta at cath 11/03/12 - S/P root replacement 11/05/12 Chronic anticoagulation for mechanical AVR  She is compliant on Coumadin.  Cardiomyopathy, ischemic Normal EF per recent echo.  Medication management - Plan: CBC Medications as above.  NSTEMI DES to RCA Maintained on plavix and coumadin. No ASA. No anginal symptoms.    Follow-up with Dr. Rennis Golden in 4 to 5 months.   Medication Adjustments/Labs and Tests Ordered: Current medicines are reviewed at length with the patient today.  Concerns regarding medicines are outlined above.  Orders Placed This Encounter  Procedures  . CBC   No orders of the defined types were placed in this  encounter.   Signed, Marcelino Duster, [place]  06/24/2018 12:05 PM    [facility]

## 2018-06-24 ENCOUNTER — Ambulatory Visit: Payer: Commercial Managed Care - PPO | Admitting: Physician Assistant

## 2018-06-24 ENCOUNTER — Encounter: Payer: Self-pay | Admitting: Physician Assistant

## 2018-06-24 VITALS — BP 110/64 | HR 66 | Ht 67.0 in | Wt 160.4 lb

## 2018-06-24 DIAGNOSIS — I729 Aneurysm of unspecified site: Secondary | ICD-10-CM

## 2018-06-24 DIAGNOSIS — I10 Essential (primary) hypertension: Secondary | ICD-10-CM | POA: Diagnosis not present

## 2018-06-24 DIAGNOSIS — I214 Non-ST elevation (NSTEMI) myocardial infarction: Secondary | ICD-10-CM

## 2018-06-24 DIAGNOSIS — I255 Ischemic cardiomyopathy: Secondary | ICD-10-CM | POA: Diagnosis not present

## 2018-06-24 DIAGNOSIS — I77810 Thoracic aortic ectasia: Secondary | ICD-10-CM

## 2018-06-24 DIAGNOSIS — Z79899 Other long term (current) drug therapy: Secondary | ICD-10-CM

## 2018-06-24 DIAGNOSIS — Z7901 Long term (current) use of anticoagulants: Secondary | ICD-10-CM

## 2018-06-24 NOTE — Patient Instructions (Addendum)
Medication Instructions:  STOP- Lisinopril  If you need a refill on your cardiac medications before your next appointment, please call your pharmacy.  Labwork: CBC Today  Testing/Procedures: None Ordered  Follow-Up: Your physician wants you to follow-up in: 3-4 Months with Dr Rennis Golden.      Thank you for choosing [facility] at [facility]!!

## 2018-06-24 NOTE — Telephone Encounter (Signed)
Paper work complete and patient notified, form faxed back to insurance and copy given to patient.

## 2018-06-25 ENCOUNTER — Other Ambulatory Visit: Payer: Self-pay | Admitting: *Deleted

## 2018-06-25 DIAGNOSIS — I6523 Occlusion and stenosis of bilateral carotid arteries: Secondary | ICD-10-CM

## 2018-06-25 LAB — CBC
HEMOGLOBIN: 10.5 g/dL — AB (ref 11.1–15.9)
Hematocrit: 33 % — ABNORMAL LOW (ref 34.0–46.6)
MCH: 26.6 pg (ref 26.6–33.0)
MCHC: 31.8 g/dL (ref 31.5–35.7)
MCV: 84 fL (ref 79–97)
Platelets: 350 10*3/uL (ref 150–450)
RBC: 3.95 x10E6/uL (ref 3.77–5.28)
RDW: 17 % — AB (ref 12.3–15.4)
WBC: 3.2 10*3/uL — ABNORMAL LOW (ref 3.4–10.8)

## 2018-07-04 ENCOUNTER — Telehealth: Payer: Self-pay

## 2018-07-04 NOTE — Telephone Encounter (Signed)
Patient dropped off disability forms to be completed; sent Angie a message to see if she would complete them or send them to the [employer] company who handles our forms. Patient has left a twenty-five dollar check to have forms completed.

## 2018-07-09 ENCOUNTER — Telehealth: Payer: Self-pay | Admitting: Physician Assistant

## 2018-07-09 NOTE — Telephone Encounter (Signed)
Spoke with Angie and we will have the [employer] company complete the form. Medical records is handling form from here.

## 2018-07-10 ENCOUNTER — Encounter: Payer: Self-pay | Admitting: Physician Assistant

## 2018-07-10 ENCOUNTER — Encounter: Payer: Commercial Managed Care - PPO | Admitting: Vascular Surgery

## 2018-07-10 ENCOUNTER — Ambulatory Visit (INDEPENDENT_AMBULATORY_CARE_PROVIDER_SITE_OTHER): Payer: Self-pay | Admitting: Physician Assistant

## 2018-07-10 VITALS — BP 156/79 | HR 66 | Temp 97.0°F | Resp 20 | Ht 67.0 in | Wt 157.0 lb

## 2018-07-10 DIAGNOSIS — I729 Aneurysm of unspecified site: Secondary | ICD-10-CM

## 2018-07-10 NOTE — Telephone Encounter (Addendum)
Received [employer] form back from [employer] for [employer] to sign off on. cbr  Received signed form back from [employer] I inter- office it back to [facility] 07/11/2018

## 2018-07-10 NOTE — Progress Notes (Signed)
POST OPERATIVE OFFICE NOTE    CC:  F/u for surgery  HPI:   [name] is a 67 y.o. 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  This was quite painful.  The neck of the aneurysm was short and I did not think the patient was a good candidate for compression or thrombin injection.  For this reason I recommended surgical repair.  Her INR was 1.8 her Coumadin had been held for 3 days.  Post op she had prolonged bleeding from the JP exit site.  Dr. Edilia Bo recommended that she return to the OR to explore the source of prolonged bleeding.  No specific bleeding site identified.  Hematoma was washed out and a Blake drain placed.  There was some generalized oozing which was cauterized.  She is here today for a follow up examination of her left groin.    She denise fever or chills.  She states she has had no episodes of bleeding and is performing daily activities.     Allergies  Allergen Reactions  . Contrast Media [Iodinated Diagnostic Agents] Hives, Itching and Swelling    Current Outpatient Medications  Medication Sig Dispense Refill  . acetaminophen (TYLENOL) 500 MG tablet Take 500-1,000 mg by mouth every 6 (six) hours as needed (pain).    Marland Kitchen atorvastatin (LIPITOR) 80 MG tablet TAKE 1 TABLET (80 MG TOTAL) BY MOUTH DAILY AT 6 PM. 90 tablet 2  . Calcium Carb-Cholecalciferol (CALCIUM 600+D3 PO) Take 1 tablet by mouth daily.     . clopidogrel (PLAVIX) 75 MG tablet TAKE 1 TABLET BY MOUTH DAILY WITH BREAKFAST (Patient taking differently: Take 75 mg by mouth daily. ) 90 tablet 3  . isosorbide mononitrate (IMDUR) 30 MG 24 hr tablet TAKE 1 TABLET BY MOUTH EVERY DAY (Patient taking differently: Take 30 mg by mouth daily. ) 30 tablet 6  . Multiple Vitamin (MULTIVITAMIN WITH MINERALS) TABS tablet Take 1 tablet by mouth daily.    . nitroGLYCERIN (NITROSTAT) 0.4 MG SL tablet PLACE 1 TABLET (0.4 MG TOTAL) UNDER THE TONGUE EVERY 5 (FIVE) MINUTES AS  NEEDED FOR CHEST PAIN. 25 tablet 6  . ondansetron (ZOFRAN) 4 MG tablet Take 1 tablet (4 mg total) by mouth every 6 (six) hours. (Patient taking differently: Take 2-4 mg by mouth daily. ) 12 tablet 0  . pantoprazole (PROTONIX) 40 MG tablet TAKE 1 TABLET BY MOUTH EVERY DAY (Patient taking differently: Take 40 mg by mouth daily. ) 30 tablet 6  . polyvinyl alcohol (ARTIFICIAL TEARS) 1.4 % ophthalmic solution Place 1-2 drops into both eyes daily as needed for dry eyes.    Marland Kitchen warfarin (COUMADIN) 5 MG tablet TAKE 1/2 TO 1 TABLET BY MOUTH DAILY OR AS DIRECTED BY COUMADIN CLINIC 90 tablet 1   No current facility-administered medications for this visit.      ROS:  See HPI  Physical Exam:  Vitals:   07/10/18 1531  BP: (!) 156/79  Pulse: 66  Resp: 20  Temp: (!) 97 F (36.1 C)  SpO2: 100%    Incision:  Left groin incision with palpable residual firmness from hematoma.  No drainage or bleeding.  Singal stitch retained at drain exit site.  Removed in clinic today.  Patient tolerated this well. Extremities:  Palpable DP pulse left LE.  No edema.  Heart : RRR Abdomen:   Soft, NTTP Lungs : CTA B  Assessment/Plan:  This is a 67 y.o. female who is s/p:repair  of pseudoaneurysms and re exploration for post op bleeding   Stable disposition from a vascular point of view without re-current hematoma/ pseudoaneurysm.  She may return to work with out restrictions from a vascular point of view.  She will f/u PRN.   Mosetta Pigeon , PA-C [facility] [phone]

## 2018-07-11 ENCOUNTER — Encounter: Payer: Self-pay | Admitting: Physician Assistant

## 2018-07-12 ENCOUNTER — Telehealth (HOSPITAL_COMMUNITY): Payer: Self-pay | Admitting: *Deleted

## 2018-07-12 NOTE — Telephone Encounter (Signed)
Clinical review of pt follow up appt on 07/10/2018 [facility] office note. Pt is making the expected progress in recovery.  Pt appropriate for scheduling for cardiac rehab.  Will forward to support staff for scheduling with pt  Consent. Nikki Dom

## 2018-07-15 ENCOUNTER — Telehealth (HOSPITAL_COMMUNITY): Payer: Self-pay

## 2018-07-15 NOTE — Telephone Encounter (Signed)
Attempted to contact patient in regards to Cardiac Rehab - vm is full. Sending letter. °

## 2018-07-31 ENCOUNTER — Telehealth (HOSPITAL_COMMUNITY): Payer: Self-pay

## 2018-07-31 NOTE — Telephone Encounter (Signed)
3rd attempted to contact pt in regards to CR, unable to leave VM.

## 2018-09-02 ENCOUNTER — Ambulatory Visit: Payer: Self-pay

## 2018-09-02 NOTE — Progress Notes (Signed)
Scheduled pt for overdue inr after visit with cardio pcp

## 2018-09-13 ENCOUNTER — Ambulatory Visit: Payer: Commercial Managed Care - PPO | Admitting: Internal Medicine

## 2018-10-10 ENCOUNTER — Other Ambulatory Visit: Payer: Self-pay | Admitting: Internal Medicine

## 2018-10-10 DIAGNOSIS — Z1231 Encounter for screening mammogram for malignant neoplasm of breast: Secondary | ICD-10-CM

## 2018-11-02 ENCOUNTER — Other Ambulatory Visit: Payer: Self-pay | Admitting: Internal Medicine

## 2018-11-04 NOTE — Telephone Encounter (Signed)
Rx(s) sent to pharmacy electronically.  

## 2018-11-08 ENCOUNTER — Encounter: Payer: Self-pay | Admitting: Internal Medicine

## 2018-11-08 ENCOUNTER — Ambulatory Visit (INDEPENDENT_AMBULATORY_CARE_PROVIDER_SITE_OTHER): Payer: Commercial Managed Care - PPO | Admitting: Pharmacist Clinician (PhC)/ Clinical Pharmacy Specialist

## 2018-11-08 ENCOUNTER — Encounter (INDEPENDENT_AMBULATORY_CARE_PROVIDER_SITE_OTHER): Payer: Self-pay

## 2018-11-08 ENCOUNTER — Ambulatory Visit
Admission: RE | Admit: 2018-11-08 | Discharge: 2018-11-08 | Disposition: A | Discharge status: Home / Self Care | Payer: Commercial Managed Care - PPO | Source: Ambulatory Visit | Attending: Internal Medicine | Admitting: Internal Medicine

## 2018-11-08 ENCOUNTER — Ambulatory Visit: Payer: Commercial Managed Care - PPO | Admitting: Internal Medicine

## 2018-11-08 VITALS — BP 140/72 | HR 61 | Ht 66.0 in | Wt 171.4 lb

## 2018-11-08 DIAGNOSIS — Z7901 Long term (current) use of anticoagulants: Secondary | ICD-10-CM | POA: Diagnosis not present

## 2018-11-08 DIAGNOSIS — Z952 Presence of prosthetic heart valve: Secondary | ICD-10-CM | POA: Diagnosis not present

## 2018-11-08 DIAGNOSIS — I729 Aneurysm of unspecified site: Secondary | ICD-10-CM

## 2018-11-08 DIAGNOSIS — I10 Essential (primary) hypertension: Secondary | ICD-10-CM | POA: Diagnosis not present

## 2018-11-08 DIAGNOSIS — I2510 Atherosclerotic heart disease of native coronary artery without angina pectoris: Secondary | ICD-10-CM

## 2018-11-08 DIAGNOSIS — Z1231 Encounter for screening mammogram for malignant neoplasm of breast: Secondary | ICD-10-CM

## 2018-11-08 DIAGNOSIS — I255 Ischemic cardiomyopathy: Secondary | ICD-10-CM | POA: Diagnosis not present

## 2018-11-08 LAB — POCT INR: INR: 2.6 (ref 2.0–3.0)

## 2018-11-08 MED ORDER — LISINOPRIL 2.5 MG PO TABS: 2.5000 mg | ORAL_TABLET | Freq: Every day | ORAL | 3 refills | Status: DC

## 2018-11-08 NOTE — Patient Instructions (Signed)
Medication Instructions:  Your physician has recommended you make the following change in your medication:   RESUME Lisinopril 2.5mg  daily. An Rx has been sent to your pharmacy.  If you need a refill on your cardiac medications before your next appointment, please call your pharmacy.   Lab work: None ordered If you have labs (blood work) drawn today and your tests are completely normal, you will receive your results only by: Marland Kitchen MyChart Message (if you have MyChart) OR . A paper copy in the mail If you have any lab test that is abnormal or we need to change your treatment, we will call you to review the results.  Testing/Procedures: None ordered  Follow-Up: At [facility], you and your health needs are our priority.  As part of our continuing mission to provide you with exceptional heart care, we have created designated Provider Care Teams.  These Care Teams include your primary Cardiologist (physician) and Advanced Practice Providers (APPs -  Physician Assistants and Nurse Practitioners) who all work together to provide you with the care you need, when you need it. You will need a follow up appointment in 6 months.  Please call our office 2 months in advance to schedule this appointment.  You may see Chrystie Nose, MD or one of the following Advanced Practice Providers on your designated Care Team: [place], [place] . Micah Flesher, PA-C

## 2018-11-08 NOTE — Progress Notes (Signed)
OFFICE NOTE  Chief Complaint:  Follow-up recent hospitalizations  Primary Care Physician: Marton Redwood, MD  HPI:  [name] is a very pleasant 68 year old African American female with a history of St. Jude AVR placed in 2000 for aortic insufficiency. 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 She also has a history of sarcoidosis, dyslipidemia and contrast allergy, which apparently gave her hives. In April 2013, she saw Dr. Rex Kras with complaints of chest pain, for which he then did a Myoview on her, which was normal. She was last seen by Kerin Ransom on October 21, 2012. At that time she was complaining of having chest pressure and shortness of breath. She was set up for a diagnostic heart catheterization. This was completed by Dr. Debara Pickett, which revealed severe ostial left main stenosis greater than 95%. There was also suggestion of possible ostial RCA disease. The ascending aorta was dilated up to 4.8 cm. At that time, Dr. Prescott Gum was asked to consult. Patient was then taken for bypass surgery x2 with a saphenous vein graft to the left anterior descending coronary artery and a saphenous vein graft to the circumflex marginal. 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 Also during that hospitalization, she had development of a pericardial hematoma. Dr. Prescott Gum then did a subxiphoid pericardial exposure and evacuation of the pericardial hematoma. She was seen in follow-up in March 2014 and was doing well.  She had lost about 17 lbs during her hospitalization.  She reports returning to work this past summer and is still able to do most activities. Her INR has been followed in our Coumadin clinic and is actually supratherapeutic today at 4. Her dose will be adjusted. She's not had any further laboratory work or followup since this past Spring.  She denies any chest pain with exertion, but does report some discomfort  associated with her anterior scars as well as her sternal wires. She does occasionally get fatigued with work but this improves with rest.  I saw [name] back today in follow-up. She is doing fairly well. She denies any shortness of breath or chest pain. She is annoyed somewhat at her mechanical valve which she says is very loud. She underwent a CT scan this year in March which shows a stable thoracic aneurysm repair at 4.0 cm. 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 In the past she's had left subclavian bypass and will need ultrasound follow-up of that. She's also not had any lab work in the past several months.  [name] returns today for follow-up. Overall she seems to be doing pretty well. She says she gets hot and noted that her blood pressure is somewhat elevated from time to time now. It seems as if she's not currently on any blood pressure medicines. Last time I saw her she was not taking lisinopril and her blood pressure was fairly normal therefore I kept her off the medicine. Previous to that she was taken off of her beta blocker. She really has indication for either one of these are both based on her history of coronary disease, coronary artery bypass grafting, valve repair, and aneurysm. Blood pressure is elevated today. She is due for an INR check. Dopplers including AAA screen as well as ultrasound of her subclavian stenosis were performed in October last year and are stable.  07/03/2016  [name] returns today for  follow-up. Unfortunately she recently had some unstable angina and presented to the hospital with chest pain. She underwent cardiac catheterization after being found to have a NSTEMI with elevated troponin of 0.26. Warfarin was held and she was found to have an EF of 40-45% with moderate hypokinesis of the basal inferior myocardium and grade 2 diastolic dysfunction. Her mechanical heart valve was functioning normally. She  then underwent cardiac catheterization which demonstrated a patent SVG to LAD and SVG to OM1. There was 95% stenosis of an ostial left main, 90% ostial first marginal, her percent ostial RCA and 90% ostial LAD disease. Given the difficulty of the case, intervention was deferred until the next day when Dr. Excell Seltzer performed PCI to the ostium of the SVG to LAD. Selective angiography was not possible after the case due to the stent strut hanging out into the aorta however aortic root angiography showed normal flow down the graft and the patient was noted to be chest pain-free. He recommended aspirin, Plavix and warfarin for 3 months, then discontinuing aspirin and continuing warfarin and Plavix. Today she reports no further chest pain. She initially talked to me about disability however after more discussion she understood that if she were to become formally disabled she could never work again and she did want to continue to work part time in addition to having disability. Based on that she wishes to go back to work and she should be able to go back to work as early as October 9.  01/12/2017  [name] returns today for follow-up. She says she feels a bit stronger and does not feel palpitations is bad at night. EKG today however shows sinus rhythm with a second-degree AV block with a rate of 69. Blood pressures accordingly low 96/50. She is on low-dose metoprolol. She recently stopped aspirin is on Plavix and warfarin. She is due for an INR check today. She had a repeat echocardiogram which shows a normally functioning mechanical aortic valve as well as an improvement in EF up to 60-65%. She does report a little bit of fatigue particularly when walking around [place]. She is not significantly active and inoperative spitting cardigan rehabilitation due to her job. I encouraged her to start to do more physical activity as I do not see any cardiac limitation to her symptoms.   08/03/2017  [name] returns today for  follow-up.  Overall she is feeling well.  She occasionally gets some bruising.  She is currently on Plavix and warfarin.  Her INR was recently assessed.  She had a GI bleeding last fall but this is not recurred.  Her LVEF improved to 60-65% as of April 2018.  She denies any recurrent chest pain.  Her valve has been stable.  We did receive lab work from her PCP this summer which showed a well-controlled lipid profile as well as stable hemoglobin of 13.5.  She tells me that she plans to retire next year.  02/22/2018  [name] was seen today in follow-up.  She continues to do well.  She denies chest pain or worsening shortness of breath.  Her INR today was 2.4 which is slightly low however reasonably well controlled.  She denies any GI bleeding.  LVEF had normalized.  She was planning to retire however it says she is still working.  Blood pressure is well controlled today.  EKG shows sinus rhythm with PVCs and PACs.  She is not symptomatic with this.    11/08/2018  [name] is seen today in  follow-up.  Unfortunately she has had recent significant hospitalizations.  I saw her in the hospital in August 2019, at which time she presented with chest pain and elevated troponin suggestive of non-STEMI.  She was anticoagulated on warfarin and I recommended admission and heart catheterization however she said that she had some things to take care of and left AGAINST MEDICAL ADVICE.  She then came back to the hospital as promised the next day for direct admission and further cardiac work-up. She did have heart catheterization which showed possible severe in-stent restenosis.  Ultimately she underwent balloon pump supported staged PCI to an ostial left main stenosis which was 95%.  She received a 5.0 x 12 mm Onyx resolute stent.  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  Then it required multiple operations due to recurrent bleeding.  She was admitted several  times to the hospital and then followed up in the office at which time several medication changes were made including taking her off of blood pressure medications.  This was due to dizziness.  [name] says that she does still have some dizziness despite coming off her medicines however blood pressure is higher than it had been previously.  Again she had a history of heart failure in the past however EF had normalized.  I am concerned about redevelopment of cardiomyopathy.  Today she is asymptomatic.  She says she has good energy level.  She has not fatigue.  She denies any chest pains.  INR today was therapeutic.  PMHx:  Past Medical History:  Diagnosis Date  . Ascending aortic aneurysm (HCC)    a.  redo replacement of fusiform aneurysm of ascending aorta (complicated by pericardial hematoma s/p evacuation) in 2014.  Marland Kitchen Contrast media allergy   . Coronary artery disease    a. s/p CABG x2 in 2014. b. s/p DES to SVG-LAD in 2017.  . H/O mechanical aortic valve replacement   . Hyperlipidemia   . Hypertension   . Ischemic cardiomyopathy    a. EF 40-45% in 2017 at time of NSTEMI, improved to normal in 2018.  . Non-STEMI (non-ST elevated myocardial infarction) (HCC)   . Premature atrial contractions   . PVC's (premature ventricular contractions)   . RA (rheumatoid arthritis) (HCC)   . S/P AVR (aortic valve replacement) 08/1999   St. Jude AVR for aortic insuff, Goal INR 2.5-3.5  . 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 Donata Clay) - complicated by pericardial hematoma --> subxiphoid pericardial exposure and evacuation b. Cath 06/2016: DES to SVG-LAD ostium  . Sarcoid   . Second degree AV block    a. ? possibly seen in 12/2016 clinic visit - EKG not truly suggestive  . Severe anemia   . 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 s/p left subclavian bypass.    Past Surgical History:  Procedure Laterality Date    . AORTIC VALVE REPLACEMENT  08/23/1999   23mm St. Jude, Bentall procedure, root replacemtn   . BYPASS GRAFT ANGIOGRAPHY  05/20/2018   Procedure: BYPASS GRAFT ANGIOGRAPHY;  Surgeon: Corky Crafts, MD;  Location: [facility] INVASIVE CV LAB;  Service: Cardiovascular;;  . CARDIAC CATHETERIZATION  08/1999   normal L main, LAD & first diagonal are normal, normal Cfx, RCA with mild ostial narrowing, normal LV systolic function, severe AI, mild MR, normal PAP (Dr. Mervyn Skeeters. Little)   .  CARDIAC CATHETERIZATION N/A 06/22/2016   Procedure: Left Heart Cath and Cors/Grafts Angiography;  Surgeon: Marykay Lexavid W Harding, MD;  [facility];  Service: Cardiovascular;  Laterality: N/A;  . CARDIAC CATHETERIZATION N/A 06/23/2016   Procedure: Left Heart Cath and Coronary Angiography;  Surgeon: Tonny BollmanMichael Cooper, MD;  [facility];  Service: Cardiovascular;  Laterality: N/A;  . CARDIAC CATHETERIZATION N/A 06/23/2016   Procedure: Coronary Stent Intervention;  Surgeon: Tonny BollmanMichael Cooper, MD;  [facility];  Service: Cardiovascular;  Laterality: N/A;  . CORONARY ARTERY BYPASS GRAFT  11/05/2012   Procedure: CORONARY ARTERY BYPASS GRAFTING (CABG);  Surgeon: Kerin PernaPeter Van Trigt, MD;  [facility];  Service: Open Heart Surgery;  Laterality: N/A;  cannulate right subclavian  . CORONARY STENT INTERVENTION N/A 05/21/2018   Procedure: CORONARY STENT INTERVENTION;  Surgeon: Corky CraftsVaranasi, Jayadeep S, MD;  [facility];  Service: Cardiovascular;  Laterality: N/A;  . ENDOVEIN HARVEST OF GREATER SAPHENOUS VEIN  11/05/2012   Procedure: ENDOVEIN HARVEST OF GREATER SAPHENOUS VEIN;  Surgeon: Kerin PernaPeter Van Trigt, MD;  [facility];  Service: Open Heart Surgery;  Laterality: Right;  . FEMORAL-FEMORAL BYPASS GRAFT Left 06/09/2018   Procedure: REPAIR OF LEFT FEMORAL ARTERY PSEUDO ANEURYSM;  Surgeon: Chuck Hintickson, Christopher S, MD;  [facility];  Service: Vascular;  Laterality: Left;  . GROIN DEBRIDEMENT Left  06/11/2018   Procedure: EXPLORATION LEFT GROIN FOR BLEEDING;  Surgeon: Chuck Hintickson, Christopher S, MD;  [facility];  Service: Vascular;  Laterality: Left;  . IABP INSERTION N/A 05/21/2018   Procedure: IABP INSERTION;  Surgeon: Corky CraftsVaranasi, Jayadeep S, MD;  [facility];  Service: Cardiovascular;  Laterality: N/A;  . INTRAOPERATIVE TRANSESOPHAGEAL ECHOCARDIOGRAM  11/05/2012   Procedure: INTRAOPERATIVE TRANSESOPHAGEAL ECHOCARDIOGRAM;  Surgeon: Kerin PernaPeter Van Trigt, MD;  [facility];  Service: Open Heart Surgery;  Laterality: N/A;  . INTRAOPERATIVE TRANSESOPHAGEAL ECHOCARDIOGRAM N/A 11/21/2012   Procedure: INTRAOPERATIVE TRANSESOPHAGEAL ECHOCARDIOGRAM;  Surgeon: Kerin PernaPeter Van Trigt, MD;  [facility];  Service: Open Heart Surgery;  Laterality: N/A;  . LEFT HEART CATHETERIZATION WITH CORONARY ANGIOGRAM N/A 11/04/2012   Procedure: LEFT HEART CATHETERIZATION WITH CORONARY ANGIOGRAM;  Surgeon: Chrystie NoseKenneth C. Tavis Kring, MD;  [facility];  Service: Cardiovascular;  Laterality: N/A;  . MEDIASTINOSCOPY  08/2005   bronchoscopy & mediastinoscopy for mediastinal adenopathy (Dr. Donata ClayVan Trigt)   . NM MYOCAR PERF WALL MOTION  01/2012   lexiscan; small, fixed apical septal breast attenuation artifact & inferolateral bowel attenuation, no reversible ischemia, post-stress EF 64%, low risk scan   . PERICARDIAL WINDOW N/A 11/21/2012   Procedure: PERICARDIAL WINDOW;  Surgeon: Kerin PernaPeter Van Trigt, MD;  [facility];  Service: Thoracic;  Laterality: N/A;  . REPLACEMENT ASCENDING AORTA  11/05/2012   Procedure: REPLACEMENT ASCENDING AORTA;  Surgeon: Kerin PernaPeter Van Trigt, MD;  [facility];  Service: Open Heart Surgery;  Laterality: N/A;  . SUBCLAVIAN ANGIOGRAM  05/10/2000   left subclavian arteriogram; patent graft, mild stenosis (Dr. Amada KingfisherG. Hayes)   . SUBCLAVIAN BYPASS GRAFT  05/16/1999   left subclavian bypass with 7mm Darcon graft (Dr. Amada KingfisherG. Hayes)  . TRANSTHORACIC ECHOCARDIOGRAM  11/20/2012   EF 65-70%, mild conc LVH, grade 2  diastolic dysfunction, mech AV, mild MR    FAMHx:  Family History  Problem Relation Age of Onset  . Hypertension Mother   . Colon cancer Neg Hx   . Esophageal cancer Neg Hx   . Pancreatic cancer Neg Hx   . Stomach cancer  Neg Hx   . Liver disease Neg Hx     SOCHx:   reports that she has never smoked. She has never used smokeless tobacco. She reports that she does not drink alcohol or use drugs.  ALLERGIES:  Allergies  Allergen Reactions  . Contrast Media [Iodinated Diagnostic Agents] Hives, Itching and Swelling    ROS: Pertinent items noted in HPI and remainder of comprehensive ROS otherwise negative.  HOME MEDS: Current Outpatient Medications  Medication Sig Dispense Refill  . acetaminophen (TYLENOL) 500 MG tablet Take 500-1,000 mg by mouth every 6 (six) hours as needed (pain).    Marland Kitchen. atorvastatin (LIPITOR) 80 MG tablet Take 1 tablet (80 mg total) by mouth daily at 6 PM. 30 tablet 7  . Calcium Carb-Cholecalciferol (CALCIUM 600+D3 PO) Take 1 tablet by mouth daily.     . clopidogrel (PLAVIX) 75 MG tablet TAKE 1 TABLET BY MOUTH DAILY WITH BREAKFAST (Patient taking differently: Take 75 mg by mouth daily. ) 90 tablet 3  . isosorbide mononitrate (IMDUR) 30 MG 24 hr tablet TAKE 1 TABLET BY MOUTH EVERY DAY (Patient taking differently: Take 30 mg by mouth daily. ) 30 tablet 6  . Multiple Vitamin (MULTIVITAMIN WITH MINERALS) TABS tablet Take 1 tablet by mouth daily.    . nitroGLYCERIN (NITROSTAT) 0.4 MG SL tablet PLACE 1 TABLET (0.4 MG TOTAL) UNDER THE TONGUE EVERY 5 (FIVE) MINUTES AS NEEDED FOR CHEST PAIN. 25 tablet 6  . ondansetron (ZOFRAN) 4 MG tablet Take 1 tablet (4 mg total) by mouth every 6 (six) hours. (Patient taking differently: Take 2-4 mg by mouth daily. ) 12 tablet 0  . pantoprazole (PROTONIX) 40 MG tablet TAKE 1 TABLET BY MOUTH EVERY DAY (Patient taking differently: Take 40 mg by mouth daily. ) 30 tablet 6  . polyvinyl alcohol (ARTIFICIAL TEARS) 1.4 % ophthalmic solution Place  1-2 drops into both eyes daily as needed for dry eyes.    Marland Kitchen. warfarin (COUMADIN) 5 MG tablet TAKE 1/2 TO 1 TABLET BY MOUTH DAILY OR AS DIRECTED BY COUMADIN CLINIC 90 tablet 1   No current facility-administered medications for this visit.     LABS/IMAGING: Results for orders placed or performed in visit on 11/08/18 (from the past 48 hour(s))  POCT INR     Status: None   Collection Time: 11/08/18  8:15 AM  Result Value Ref Range   INR 2.6 2.0 - 3.0   No results found.  VITALS: BP 140/72   Pulse 61   Ht 5\' 6"  (1.676 m)   Wt 171 lb 6.4 oz (77.7 kg)   BMI 27.66 kg/m   EXAM: General appearance: alert and no distress Neck: no carotid bruit and no JVD Lungs: clear to auscultation bilaterally Heart: regular rate and rhythm and sharp mechanical valve sounds Abdomen: soft, non-tender; bowel sounds normal; no masses,  no organomegaly Extremities: extremities normal, atraumatic, no cyanosis or edema Pulses: 2+ and symmetric Skin: Skin color, texture, turgor normal. No rashes or lesions Neurologic: Grossly normal Psych: Pleasant, normal  EKG: Deferred  ASSESSMENT: 1. Coronary artery disease status post two-vessel CABG in 11/2012 (LIMA to LAD, SVG to circumflex). 2. NSTEMI - s/p PCI with DES to Ostial SVG-LAD stenosis (06/2016), 95% ostial left main stenosis status post 5.0 x 12 mm Onyx resolute DES (05/2018) 3. 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 Ischemic cardiopathy EF 40-45% with inferior hypokinesis - improved to 60-65% (echo 12/2016) 5. Status post Bentall with mechanical aortic valve for aortic root aneurysm-11/2012 6.  Status post pericardial window with hematoma evacuation 7. History of left subclavian stenosis status post bypass in 2001 8. Rheumatoid arthritis 9. Sarcoidosis 10. Dyslipidemia 11. Hypertension  PLAN: 1.   [name] had a successful PCI to the ostial left main however had a complicated pseudoaneurysm which developed possibly as a  result of a closure device.  She ultimately had surgery because of recurrent bleeding.  Currently she is doing much better.  She denies any chest pain or worsening shortness of breath.  Blood pressure is still somewhat elevated.  I recommended adding back lisinopril 2.5 mg daily as coming off the medication did not seem to affect her dizziness.  This dizziness is positional and only when laying on her right side in bed but very briefly.  Otherwise she is doing quite well.  We will plan follow-up in 6 months or sooner as necessary.  Chrystie Nose, MD, [facility], FACP  [place]  [facility]  Medical Director of the Advanced Lipid Disorders &  Cardiovascular Risk Reduction Clinic Diplomate of the American Board of Clinical Lipidology Attending Cardiologist  Direct Dial: [phone]  Fax: [phone]  Website:  [email or web address] 11/08/2018, 8:24 AM

## 2018-11-18 ENCOUNTER — Other Ambulatory Visit: Payer: Self-pay

## 2018-11-18 MED ORDER — ISOSORBIDE MONONITRATE ER 30 MG PO TB24: 30.0000 mg | ORAL_TABLET | Freq: Every day | ORAL | 3 refills | Status: DC

## 2018-11-18 NOTE — Telephone Encounter (Signed)
Rx(s) sent to pharmacy electronically.  

## 2018-12-13 ENCOUNTER — Telehealth: Payer: Self-pay | Admitting: Internal Medicine

## 2018-12-13 NOTE — Telephone Encounter (Signed)
Advised patient, verbalized understanding  

## 2018-12-13 NOTE — Telephone Encounter (Signed)
Pt was put on new medications and wanted to make the Coumadin clinic aware of the changes   Cefdinir 300mg  capsule   2x daily for 7 days Doxycycline hyvclate 100 mg tablet  2x daily for 7 days trednisone 10 mg tablet 1x daily for 4 days Fluticasone prop (Nasal spray) spray 2 sprays in each nostril daily for 3-4 weeks   Pt was put on these meds by Ronney Lion , pcp

## 2018-12-13 NOTE — Telephone Encounter (Signed)
Prednisone and doxycycline are expected to increase your INR.  Please HOLD warfarin dose today (3/13) ONLY, take 1/2 tablet tomorrow (3/14), then resume normal dose of 1 tablet daily except for 1/2 tab every Monday & Thu  Keep appointment with coumadin clinic on 12/20/2018

## 2018-12-13 NOTE — Telephone Encounter (Signed)
Will forward to Anti-coag clinic  

## 2018-12-19 ENCOUNTER — Other Ambulatory Visit: Payer: Self-pay | Admitting: Internal Medicine

## 2018-12-19 ENCOUNTER — Telehealth: Payer: Self-pay

## 2018-12-19 NOTE — Telephone Encounter (Signed)
LMOM FOR PRESCREEN  

## 2018-12-27 ENCOUNTER — Ambulatory Visit (INDEPENDENT_AMBULATORY_CARE_PROVIDER_SITE_OTHER): Payer: Commercial Managed Care - PPO | Admitting: Pharmacist Clinician (PhC)/ Clinical Pharmacy Specialist

## 2018-12-27 ENCOUNTER — Other Ambulatory Visit: Payer: Self-pay

## 2018-12-27 DIAGNOSIS — Z7901 Long term (current) use of anticoagulants: Secondary | ICD-10-CM | POA: Diagnosis not present

## 2018-12-27 DIAGNOSIS — Z952 Presence of prosthetic heart valve: Secondary | ICD-10-CM | POA: Diagnosis not present

## 2018-12-27 LAB — POCT INR: INR: 1.6 — AB (ref 2.0–3.0)

## 2019-01-21 ENCOUNTER — Telehealth: Payer: Self-pay

## 2019-01-21 NOTE — Telephone Encounter (Signed)
Unable to lmom for prescreen  

## 2019-02-11 ENCOUNTER — Other Ambulatory Visit: Payer: Self-pay | Admitting: Internal Medicine

## 2019-03-01 ENCOUNTER — Other Ambulatory Visit: Payer: Self-pay | Admitting: Internal Medicine

## 2019-03-13 ENCOUNTER — Telehealth: Payer: Self-pay

## 2019-03-13 NOTE — Telephone Encounter (Signed)
lmomed to schedule inr check

## 2019-03-17 ENCOUNTER — Telehealth: Payer: Self-pay | Admitting: Internal Medicine

## 2019-03-17 ENCOUNTER — Telehealth: Payer: Self-pay

## 2019-03-17 NOTE — Telephone Encounter (Signed)
lmom for prescreen  

## 2019-03-17 NOTE — Telephone Encounter (Signed)
F/U Message             Patient is returning [name]'s call would like a call badk.

## 2019-03-17 NOTE — Telephone Encounter (Signed)
Attempted to call the pt back and got a msg stating, "we're sorry your call did not go thru." multiple times

## 2019-03-21 ENCOUNTER — Ambulatory Visit (INDEPENDENT_AMBULATORY_CARE_PROVIDER_SITE_OTHER): Payer: Commercial Managed Care - PPO | Admitting: *Deleted

## 2019-03-21 ENCOUNTER — Other Ambulatory Visit: Payer: Self-pay

## 2019-03-21 DIAGNOSIS — Z7901 Long term (current) use of anticoagulants: Secondary | ICD-10-CM | POA: Diagnosis not present

## 2019-03-21 DIAGNOSIS — Z952 Presence of prosthetic heart valve: Secondary | ICD-10-CM

## 2019-03-21 LAB — POCT INR: INR: 2.7 (ref 2.0–3.0)

## 2019-03-21 NOTE — Patient Instructions (Signed)
Description   Continue with 1 tablet daily except 1/2 tablet each Monday and Thursday.  Repeat INR in 6 weeks.

## 2019-03-22 LAB — CBC
Hematocrit: 40 % (ref 34.0–46.6)
Hemoglobin: 12.9 g/dL (ref 11.1–15.9)
MCH: 26.2 pg — ABNORMAL LOW (ref 26.6–33.0)
MCHC: 32.3 g/dL (ref 31.5–35.7)
MCV: 81 fL (ref 79–97)
Platelets: 203 10*3/uL (ref 150–450)
RBC: 4.92 x10E6/uL (ref 3.77–5.28)
RDW: 15.1 % (ref 11.7–15.4)
WBC: 4.7 10*3/uL (ref 3.4–10.8)

## 2019-03-25 ENCOUNTER — Other Ambulatory Visit: Payer: Self-pay | Admitting: Internal Medicine

## 2019-04-01 ENCOUNTER — Telehealth: Payer: Self-pay | Admitting: Internal Medicine

## 2019-04-01 NOTE — Telephone Encounter (Signed)
New Message           Patient is calling to get an appointment for brusing, she states that is coming up all over body and there I nothing available, pls call to advise.

## 2019-04-02 ENCOUNTER — Other Ambulatory Visit: Payer: Self-pay

## 2019-04-02 ENCOUNTER — Ambulatory Visit (INDEPENDENT_AMBULATORY_CARE_PROVIDER_SITE_OTHER): Payer: Commercial Managed Care - PPO | Admitting: Pharmacist

## 2019-04-02 DIAGNOSIS — Z7901 Long term (current) use of anticoagulants: Secondary | ICD-10-CM

## 2019-04-02 DIAGNOSIS — Z952 Presence of prosthetic heart valve: Secondary | ICD-10-CM

## 2019-04-02 LAB — POCT INR: INR: 2.9 (ref 2.0–3.0)

## 2019-04-02 NOTE — Telephone Encounter (Signed)
Called and scheduled an appt for 330 today

## 2019-04-02 NOTE — Telephone Encounter (Signed)

## 2019-04-02 NOTE — Patient Instructions (Addendum)
Decrease warfarin dose to 1 tablet daily except for 1/2 tab every Monday, Wednesday and Friday for next 2 weeks, then continue with 1 tablet daily except 1/2 tablet each Monday and Thursday.  Repeat INR in 4 weeks.  *Patient to see PCP to follow up on bruises*

## 2019-05-19 ENCOUNTER — Other Ambulatory Visit: Payer: Self-pay | Admitting: Cardiothoracic Surgery

## 2019-05-20 ENCOUNTER — Other Ambulatory Visit: Payer: Self-pay | Admitting: Cardiothoracic Surgery

## 2019-05-20 DIAGNOSIS — Z951 Presence of aortocoronary bypass graft: Secondary | ICD-10-CM

## 2019-05-21 ENCOUNTER — Encounter: Payer: Self-pay | Admitting: Cardiothoracic Surgery

## 2019-05-21 ENCOUNTER — Other Ambulatory Visit: Payer: Self-pay

## 2019-05-21 ENCOUNTER — Ambulatory Visit
Admission: RE | Admit: 2019-05-21 | Discharge: 2019-05-21 | Disposition: A | Discharge status: Home / Self Care | Payer: Commercial Managed Care - PPO | Source: Ambulatory Visit | Attending: Cardiothoracic Surgery | Admitting: Cardiothoracic Surgery

## 2019-05-21 ENCOUNTER — Other Ambulatory Visit: Payer: Self-pay | Admitting: *Deleted

## 2019-05-21 ENCOUNTER — Ambulatory Visit: Payer: Commercial Managed Care - PPO | Admitting: Cardiothoracic Surgery

## 2019-05-21 VITALS — BP 131/68 | HR 58 | Temp 97.3°F | Resp 16 | Ht 66.0 in | Wt 162.0 lb

## 2019-05-21 DIAGNOSIS — Z951 Presence of aortocoronary bypass graft: Secondary | ICD-10-CM

## 2019-05-21 DIAGNOSIS — T8189XA Other complications of procedures, not elsewhere classified, initial encounter: Secondary | ICD-10-CM

## 2019-05-21 NOTE — Progress Notes (Signed)
PCP is [name], MD Referring Provider is [name], MD  Chief Complaint  Patient presents with  . Chest Pain    HX of CABG 11/2012, c/o possible sternal wire protruding, CXR     HPI: Patient returns for painful lower sternal wire.  6 years ago she had redo Bentall procedure.  Recently the lower sternal wire is been painful.  Chest x-ray shows it the pointing straight out.  Her cardiac function is stable.  Echocardiogram 2019 shows normal LV function, normal functioning mechanical aortic valve without AI.  She is in sinus rhythm.  Patient is taking Plavix and Coumadin which will be stopped prior to the removal of the sternal wire under general anesthesia through a small incision.   Past Medical History:  Diagnosis Date  . Ascending aortic aneurysm (HCC)    a.  redo replacement of fusiform aneurysm of ascending aorta (complicated by pericardial hematoma s/p evacuation) in 2014.  Marland Kitchen Contrast media allergy   . Coronary artery disease    a. s/p CABG x2 in 2014. b. s/p DES to SVG-LAD in 2017.  . H/O mechanical aortic valve replacement   . Hyperlipidemia   . Hypertension   . Ischemic cardiomyopathy    a. EF 40-45% in 2017 at time of NSTEMI, improved to normal in 2018.  . Non-STEMI (non-ST elevated myocardial infarction) (HCC)   . Premature atrial contractions   . PVC's (premature ventricular contractions)   . RA (rheumatoid arthritis) (HCC)   . S/P AVR (aortic valve replacement) 08/1999   St. Jude AVR for aortic insuff, Goal INR 2.5-3.5  . 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 Donata Clay) - complicated by pericardial hematoma --> subxiphoid pericardial exposure and evacuation b. Cath 06/2016: DES to SVG-LAD ostium  . Sarcoid   . Second degree AV block    a. ? possibly seen in 12/2016 clinic visit - EKG not truly suggestive  . Severe anemia   . 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 s/p  left subclavian bypass.    Past Surgical History:  Procedure Laterality Date  . AORTIC VALVE REPLACEMENT  08/23/1999   59mm St. Jude, Bentall procedure, root replacemtn   . BYPASS GRAFT ANGIOGRAPHY  05/20/2018   Procedure: BYPASS GRAFT ANGIOGRAPHY;  Surgeon: Corky Crafts, MD;  Location: [facility] INVASIVE CV LAB;  Service: Cardiovascular;;  . CARDIAC CATHETERIZATION  08/1999   normal L main, LAD & first diagonal are normal, normal Cfx, RCA with mild ostial narrowing, normal LV systolic function, severe AI, mild MR, normal PAP (Dr. Mervyn Skeeters. [place])   . CARDIAC CATHETERIZATION N/A 06/22/2016   Procedure: Left Heart Cath and Cors/Grafts Angiography;  Surgeon: Marykay Lex, MD;  Location: [facility] INVASIVE CV LAB;  Service: Cardiovascular;  Laterality: N/A;  . CARDIAC CATHETERIZATION N/A 06/23/2016   Procedure: Left Heart Cath and Coronary Angiography;  Surgeon: Tonny Bollman, MD;  Location: [facility] INVASIVE CV LAB;  Service: Cardiovascular;  Laterality: N/A;  . CARDIAC CATHETERIZATION N/A 06/23/2016   Procedure: Coronary Stent Intervention;  Surgeon: Tonny Bollman, MD;  Location: [facility] INVASIVE CV LAB;  Service: Cardiovascular;  Laterality: N/A;  . CORONARY ARTERY BYPASS GRAFT  11/05/2012   Procedure: CORONARY ARTERY BYPASS GRAFTING (CABG);  Surgeon: Kerin Perna, MD;  Location: [facility] OR;  Service: Open Heart Surgery;  Laterality: N/A;  cannulate right subclavian  . CORONARY STENT INTERVENTION N/A 05/21/2018   Procedure:  CORONARY STENT INTERVENTION;  Surgeon: Corky CraftsVaranasi, Jayadeep S, MD;  [facility];  Service: Cardiovascular;  Laterality: N/A;  . ENDOVEIN HARVEST OF GREATER SAPHENOUS VEIN  11/05/2012   Procedure: ENDOVEIN HARVEST OF GREATER SAPHENOUS VEIN;  Surgeon: Kerin PernaPeter Van Trigt, MD;  [facility];  Service: Open Heart Surgery;  Laterality: Right;  . FEMORAL-FEMORAL BYPASS GRAFT Left 06/09/2018   Procedure: REPAIR OF LEFT FEMORAL ARTERY PSEUDO ANEURYSM;  Surgeon: Chuck Hintickson, Christopher S, MD;   [facility];  Service: Vascular;  Laterality: Left;  . GROIN DEBRIDEMENT Left 06/11/2018   Procedure: EXPLORATION LEFT GROIN FOR BLEEDING;  Surgeon: Chuck Hintickson, Christopher S, MD;  [facility];  Service: Vascular;  Laterality: Left;  . IABP INSERTION N/A 05/21/2018   Procedure: IABP INSERTION;  Surgeon: Corky CraftsVaranasi, Jayadeep S, MD;  [facility];  Service: Cardiovascular;  Laterality: N/A;  . INTRAOPERATIVE TRANSESOPHAGEAL ECHOCARDIOGRAM  11/05/2012   Procedure: INTRAOPERATIVE TRANSESOPHAGEAL ECHOCARDIOGRAM;  Surgeon: Kerin PernaPeter Van Trigt, MD;  [facility];  Service: Open Heart Surgery;  Laterality: N/A;  . INTRAOPERATIVE TRANSESOPHAGEAL ECHOCARDIOGRAM N/A 11/21/2012   Procedure: INTRAOPERATIVE TRANSESOPHAGEAL ECHOCARDIOGRAM;  Surgeon: Kerin PernaPeter Van Trigt, MD;  [facility];  Service: Open Heart Surgery;  Laterality: N/A;  . LEFT HEART CATHETERIZATION WITH CORONARY ANGIOGRAM N/A 11/04/2012   Procedure: LEFT HEART CATHETERIZATION WITH CORONARY ANGIOGRAM;  Surgeon: Chrystie NoseKenneth C. Hilty, MD;  [facility];  Service: Cardiovascular;  Laterality: N/A;  . MEDIASTINOSCOPY  08/2005   bronchoscopy & mediastinoscopy for mediastinal adenopathy (Dr. Donata ClayVan Trigt)   . NM MYOCAR PERF WALL MOTION  01/2012   lexiscan; small, fixed apical septal breast attenuation artifact & inferolateral bowel attenuation, no reversible ischemia, post-stress EF 64%, low risk scan   . PERICARDIAL WINDOW N/A 11/21/2012   Procedure: PERICARDIAL WINDOW;  Surgeon: Kerin PernaPeter Van Trigt, MD;  [facility];  Service: Thoracic;  Laterality: N/A;  . REPLACEMENT ASCENDING AORTA  11/05/2012   Procedure: REPLACEMENT ASCENDING AORTA;  Surgeon: Kerin PernaPeter Van Trigt, MD;  [facility];  Service: Open Heart Surgery;  Laterality: N/A;  . SUBCLAVIAN ANGIOGRAM  05/10/2000   left subclavian arteriogram; patent graft, mild stenosis (Dr. Amada KingfisherG. Hayes)   . SUBCLAVIAN BYPASS GRAFT  05/16/1999   left subclavian bypass with 7mm Darcon graft (Dr. Amada KingfisherG. Hayes)   . TRANSTHORACIC ECHOCARDIOGRAM  11/20/2012   EF 65-70%, mild conc LVH, grade 2 diastolic dysfunction, mech AV, mild MR    Family History  Problem Relation Age of Onset  . Hypertension Mother   . Colon cancer Neg Hx   . Esophageal cancer Neg Hx   . Pancreatic cancer Neg Hx   . Stomach cancer Neg Hx   . Liver disease Neg Hx     Social History Social History   Tobacco Use  . Smoking status: Never Smoker  . Smokeless tobacco: Never Used  Substance Use Topics  . Alcohol use: No  . Drug use: No    Current Outpatient Medications  Medication Sig Dispense Refill  . acetaminophen (TYLENOL) 500 MG tablet Take 500-1,000 mg by mouth every 6 (six) hours as needed (pain).    Marland Kitchen. atorvastatin (LIPITOR) 80 MG tablet Take 1 tablet (80 mg total) by mouth daily at 6 PM. 30 tablet 7  . Calcium Carb-Cholecalciferol (CALCIUM 600+D3 PO) Take 1 tablet by mouth daily.     . clopidogrel (PLAVIX) 75 MG tablet TAKE 1 TABLET BY MOUTH EVERY DAY WITH BREAKFAST 30 tablet 11  . lisinopril (PRINIVIL,ZESTRIL) 2.5 MG  tablet Take 1 tablet (2.5 mg total) by mouth daily. 90 tablet 3  . Multiple Vitamin (MULTIVITAMIN WITH MINERALS) TABS tablet Take 1 tablet by mouth daily.    . nitroGLYCERIN (NITROSTAT) 0.4 MG SL tablet PLACE 1 TABLET (0.4 MG TOTAL) UNDER THE TONGUE EVERY 5 (FIVE) MINUTES AS NEEDED FOR CHEST PAIN. 25 tablet 6  . pantoprazole (PROTONIX) 40 MG tablet TAKE 1 TABLET BY MOUTH EVERY DAY 30 tablet 10  . polyvinyl alcohol (ARTIFICIAL TEARS) 1.4 % ophthalmic solution Place 1-2 drops into both eyes daily as needed for dry eyes.    Marland Kitchen warfarin (COUMADIN) 5 MG tablet TAKE 1/2 TO 1 TABLET BY MOUTH DAILY OR AS DIRECTED BY COUMADIN CLINIC 90 tablet 01  . isosorbide mononitrate (IMDUR) 30 MG 24 hr tablet Take 1 tablet (30 mg total) by mouth daily. (Patient not taking: Reported on 05/21/2019) 90 tablet 3   No current facility-administered medications for this visit.     Allergies  Allergen Reactions  . Contrast  Media [Iodinated Diagnostic Agents] Hives, Itching and Swelling    Review of Systems  No symptoms of cough fever headache or change in taste Weight stable No shortness of breath No chest pain No bleeding problems but easy bruisability  BP 131/68 (BP Location: Left Arm, Patient Position: Sitting, Cuff Size: Normal)   Pulse (!) 58   Temp (!) 97.3 F (36.3 C)   Resp 16   Ht 5\' 6"  (1.676 m)   Wt 162 lb (73.5 kg)   SpO2 99% Comment: RA  BMI 26.15 kg/m  Physical Exam      Exam    General- alert and comfortable    Neck- no JVD, no cervical adenopathy palpable, no carotid bruit   Lungs- clear without rales, wheezes   Cor- regular rate and rhythm, normal mechanical valve closure click without gallop   Abdomen- soft, non-tender   Extremities - warm, non-tender, minimal edema   Neuro- oriented, appropriate, no focal weakness   Diagnostic Tests: Chest x-ray performed today personally reviewed showing the lower sternal wire to be sticking straight out  Impression: Painful sternal wire.  This can be removed under general anesthesia with minimal risk with a small incision.  She will need to stop her Coumadin and Plavix now and surgical be in 4 days.  Plan: Return to [facility] hospital for outpatient surgery to remove painful lower sternal wire.   Len Childs, MD [facility] [phone]

## 2019-05-22 ENCOUNTER — Other Ambulatory Visit (HOSPITAL_COMMUNITY)
Admission: RE | Admit: 2019-05-22 | Discharge: 2019-05-22 | Disposition: A | Discharge status: Home / Self Care | Payer: Commercial Managed Care - PPO | Source: Ambulatory Visit | Attending: Cardiothoracic Surgery | Admitting: Cardiothoracic Surgery

## 2019-05-22 DIAGNOSIS — Z01812 Encounter for preprocedural laboratory examination: Secondary | ICD-10-CM | POA: Diagnosis not present

## 2019-05-22 DIAGNOSIS — T8189XA Other complications of procedures, not elsewhere classified, initial encounter: Secondary | ICD-10-CM

## 2019-05-22 DIAGNOSIS — Z20828 Contact with and (suspected) exposure to other viral communicable diseases: Secondary | ICD-10-CM | POA: Insufficient documentation

## 2019-05-23 ENCOUNTER — Other Ambulatory Visit: Payer: Self-pay

## 2019-05-23 ENCOUNTER — Encounter (HOSPITAL_COMMUNITY): Payer: Self-pay | Admitting: *Deleted

## 2019-05-23 LAB — SARS CORONAVIRUS 2 (TAT 6-24 HRS): SARS Coronavirus 2: NEGATIVE

## 2019-05-23 NOTE — Progress Notes (Signed)
Pt denies SOB and chest pain. PCP - Marton Redwood, MD Cardiologist - Dr. Debara Pickett  Chest x-ray - 05/21/19 EKG - DOS (1 month) Stress Test - 07/21/16 ECHO - 05/22/18 Cardiac Cath - 05/21/18 Labs: DOS Sleep Study - denies  Blood Thinner Instructions: Last dose of Plavix was 05/21/19 and last dose of Coumadin was 05/20/19 as instructed. Aspirin Instructions: n/a Pt made aware to stop taking vitamins, fish oil and herbal medications. Do not take any NSAIDs ie: Ibuprofen, Advil, Naproxen (Aleve), Motrin, BC and Goody Powder.  Anesthesia review: yes; see note.  Patient verbalized understanding of instructions

## 2019-05-23 NOTE — Progress Notes (Signed)
Anesthesia Chart Review: [name]    Case: [id number] Date/Time: 05/26/19 1059   Procedure: STERNAL WIRE REMOVAL (N/A )   Anesthesia type: General   Pre-op diagnosis: PAINFUL STERNAL WIRE   [facility] / [facility]   Surgeon: Kerin Perna, MD      DISCUSSION: Patient is a 68 year old female scheduled for the above procedure.  History includes never smoker, CAD/severe AR/dilated aortic root (s/p Bentall procedure with aortic root replacement with 23 mm St. 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  Last seen by cardiologist Dr. Rennis Golden 11/2018 and doing okay at that time. Per Dr. Donata Clay, "Patient is taking Plavix and Coumadin which will be stopped prior to the removal of the sternal wire under general anesthesia through a small incision." Last dose 05/21/19 by notes.  She is for labs and anesthesia team evaluation on the day of surgery. 05/22/19 surgical COVID test was negative.   PROVIDERS: [name], MD is PCP  Chrystie Nose, MD is cardiologist. Last visit 11/08/18. Doing "much better" at that time. Six month follow-up recommended.   LABS: She is for updated labs on the day of surgery.   IMAGES: CXR 05/21/19: IMPRESSION: Postoperative changes. No pneumothorax. Heart is upper normal in size. No edema or consolidation. Aortic Atherosclerosis.   EKG: 11/08/18 ([facility]): Sinus rhythm with premature supraventricular complexes Minimal voltage criteria for LVH, may be normal  variant Cannot rule out anterior infarct, age undetermined.   CV: Carotid US 06/21/18: Final Interpretation: - Right Carotid: Velocities in the right ICA are consistent with a 1-39% stenosis. - Left Carotid: Velocities in the left ICA are consistent with a 1-39% stenosis. Patent left common carotid to subclavian artery bypass graft with elevated velocities as noted above. - Vertebrals:  Right vertebral artery demonstrates antegrade flow. Left vertebral artery demonstrates retrograde flow. - Subclavians: Left subclavian artery was stenotic. Normal flow hemodynamics were seen in the right subclavian artery.   Echo 05/22/18: Study Conclusions - Left ventricle: The cavity size was normal. Wall thickness was   normal. Systolic function was normal. The estimated ejection   fraction was in the range of 60% to 65%. Wall motion was normal;   there were no regional wall motion abnormalities. Doppler   parameters are consistent with abnormal left ventricular   relaxation (grade 1 diastolic dysfunction). - Aortic valve: A mechanical prosthesis was present. Transvalvular   gradient is higher than expected. Consider further evaluation   with fluoroscopy or TEE to evaluate metalic leaflet excursion.   Peak velocity (S): 413 cm/s. 280cm/s prior ECHO. Valve area   (VTI): 0.98 cm^2. Valve area (Vmax): 0.9 cm^2. Valve area   (Vmean): 0.91 cm^2. - Mitral valve: There was mild regurgitation. - Left atrium: The atrium was mildly dilated.   Cardiac cath 05/20/18: Conclusions:  Ost LM to LM lesion is 95% stenosed.  Ost LAD to Prox LAD lesion is 100% stenosed.  Ost 1st Mrg lesion is 90% stenosed.  Ost RCA to Prox RCA lesion is 100% stenosed. Not injected today.  SVG Y-graft to LAD and circumflex. Possible Origin lesion is 90%  stenosed of the prior SVG stent from 2017.  Normal right heart pressures.  Ao sat 98%, PA sat 76%, CO 6.4 L/min; CI 3.5; mean PA 13 mm Hg; mean PCWP 8 mm Hg  Thoracic aortic  tortuosity made catheter manipulation more difficult.  Chest pain at the conclusion of the procedure was relieved with IV NTG. Significant ST depressions when she had CP and increased BP . Will need close monitoring of BP. PCI 05/21/18:  Ost LM lesion is 95% stenosed.  A drug-eluting stent was successfully placed using a STENT RESOLUTE ONYX 5.0X12.  Post intervention, there is a 0% residual stenosis.  IABP placed preprocedure for left main intervention.  Prox LAD lesion is 10% stenosed. Recommend to resume Warfarin, at currently prescribed dose and frequency, on 05/21/18.  Recommend concurrent antiplatelet therapy of Aspirin 81mg  daily for 1 month and Clopidogrel 75mg  daily for indefinitely, as long as there are no bleeding problems. .   Past Medical History:  Diagnosis Date  . Ascending aortic aneurysm (HCC)    a.  redo replacement of fusiform aneurysm of ascending aorta (complicated by pericardial hematoma s/p evacuation) in 2014.  Contrast media allergy   . Coronary artery disease    a. s/p CABG x2 in 2014. b. s/p DES to SVG-LAD in 2017.  . H/O mechanical aortic valve replacement   . Hyperlipidemia   . Hypertension   . Ischemic cardiomyopathy    a. EF 40-45% in 2017 at time of NSTEMI, improved to normal in 2018.  . Non-STEMI (non-ST elevated myocardial infarction) (HCC)   . Premature atrial contractions   . PVC's (premature ventricular contractions)   . RA (rheumatoid arthritis) (HCC)   . S/P AVR (aortic valve replacement) 08/1999   St. Jude AVR for aortic insuff, Goal INR 2.5-3.5  . 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 09/1999) - complicated by pericardial hematoma --> subxiphoid pericardial exposure and evacuation b. Cath 06/2016: DES to SVG-LAD ostium  . Sarcoid   . Second degree AV block    a. ? possibly seen in 12/2016 clinic visit - EKG not truly suggestive  . Severe anemia   .  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 s/p left subclavian bypass.    Past Surgical History:  Procedure Laterality Date  . AORTIC VALVE REPLACEMENT  08/23/1999   33mm St. Jude, Bentall procedure, root replacemtn   . BYPASS GRAFT ANGIOGRAPHY  05/20/2018   Procedure: BYPASS GRAFT ANGIOGRAPHY;  Surgeon: 37m, MD;  Location: [facility] INVASIVE CV LAB;  Service: Cardiovascular;;  . CARDIAC CATHETERIZATION  08/1999   normal L main, LAD & first diagonal are normal, normal Cfx, RCA with mild ostial narrowing, normal LV systolic function, severe AI, mild MR, normal PAP (Dr. [facility]. [place])   . CARDIAC CATHETERIZATION N/A 06/22/2016   Procedure: Left Heart Cath and Cors/Grafts Angiography;  Surgeon: Mervyn Skeeters, MD;  Location: [facility] INVASIVE CV LAB;  Service: Cardiovascular;  Laterality: N/A;  . CARDIAC CATHETERIZATION N/A 06/23/2016   Procedure: Left Heart Cath and Coronary Angiography;  Surgeon: [facility], MD;  Location: [facility] INVASIVE CV LAB;  Service: Cardiovascular;  Laterality: N/A;  . CARDIAC CATHETERIZATION N/A 06/23/2016   Procedure: Coronary Stent Intervention;  Surgeon: [facility], MD;  Location: [facility] INVASIVE CV LAB;  Service: Cardiovascular;  Laterality: N/A;  . CORONARY ARTERY BYPASS GRAFT  11/05/2012   Procedure: CORONARY ARTERY  BYPASS GRAFTING (CABG);  Surgeon: Ivin Poot, MD;  Location: [place];  Service: Open Heart Surgery;  Laterality: N/A;  cannulate right subclavian  . CORONARY STENT INTERVENTION N/A 05/21/2018   Procedure: CORONARY STENT INTERVENTION;  Surgeon: Jettie Booze, MD;  Location: [place] CV LAB;  Service: Cardiovascular;  Laterality: N/A;  . ENDOVEIN HARVEST OF GREATER SAPHENOUS VEIN  11/05/2012   Procedure: ENDOVEIN HARVEST OF GREATER SAPHENOUS VEIN;  Surgeon: Ivin Poot, MD;  Location: [place];  Service: Open Heart Surgery;  Laterality: Right;  . FEMORAL-FEMORAL BYPASS GRAFT Left 06/09/2018   Procedure: REPAIR OF LEFT FEMORAL ARTERY PSEUDO  ANEURYSM;  Surgeon: Angelia Mould, MD;  Location: [place];  Service: Vascular;  Laterality: Left;  . GROIN DEBRIDEMENT Left 06/11/2018   Procedure: EXPLORATION LEFT GROIN FOR BLEEDING;  Surgeon: Angelia Mould, MD;  Location: [place];  Service: Vascular;  Laterality: Left;  . IABP INSERTION N/A 05/21/2018   Procedure: IABP INSERTION;  Surgeon: Jettie Booze, MD;  Location: [place] CV LAB;  Service: Cardiovascular;  Laterality: N/A;  . INTRAOPERATIVE TRANSESOPHAGEAL ECHOCARDIOGRAM  11/05/2012   Procedure: INTRAOPERATIVE TRANSESOPHAGEAL ECHOCARDIOGRAM;  Surgeon: Ivin Poot, MD;  Location: [place];  Service: Open Heart Surgery;  Laterality: N/A;  . INTRAOPERATIVE TRANSESOPHAGEAL ECHOCARDIOGRAM N/A 11/21/2012   Procedure: INTRAOPERATIVE TRANSESOPHAGEAL ECHOCARDIOGRAM;  Surgeon: Ivin Poot, MD;  Location: [place];  Service: Open Heart Surgery;  Laterality: N/A;  . LEFT HEART CATHETERIZATION WITH CORONARY ANGIOGRAM N/A 11/04/2012   Procedure: LEFT HEART CATHETERIZATION WITH CORONARY ANGIOGRAM;  Surgeon: Pixie Casino, MD;  Location: [facility] CATH LAB;  Service: Cardiovascular;  Laterality: N/A;  . MEDIASTINOSCOPY  08/2005   bronchoscopy & mediastinoscopy for mediastinal adenopathy (Dr. Prescott Gum)   . NM MYOCAR PERF WALL MOTION  01/2012   lexiscan; small, fixed apical septal breast attenuation artifact & inferolateral bowel attenuation, no reversible ischemia, post-stress EF 64%, low risk scan   . PERICARDIAL WINDOW N/A 11/21/2012   Procedure: PERICARDIAL WINDOW;  Surgeon: Ivin Poot, MD;  Location: [place];  Service: Thoracic;  Laterality: N/A;  . REPLACEMENT ASCENDING AORTA  11/05/2012   Procedure: REPLACEMENT ASCENDING AORTA;  Surgeon: Ivin Poot, MD;  Location: [place];  Service: Open Heart Surgery;  Laterality: N/A;  . SUBCLAVIAN ANGIOGRAM  05/10/2000   left subclavian arteriogram; patent graft, mild stenosis (Dr. Delton See)   . SUBCLAVIAN BYPASS GRAFT  05/16/1999   left  subclavian bypass with 52mm Darcon graft (Dr. Delton See)  . TRANSTHORACIC ECHOCARDIOGRAM  11/20/2012   EF 65-70%, mild conc LVH, grade 2 diastolic dysfunction, mech AV, mild MR    MEDICATIONS: No current facility-administered medications for this encounter.    Marland Kitchen acetaminophen (TYLENOL) 500 MG tablet  . atorvastatin (LIPITOR) 80 MG tablet  . Calcium Carb-Cholecalciferol (CALCIUM 600+D3 PO)  . clopidogrel (PLAVIX) 75 MG tablet  . diclofenac sodium (VOLTAREN) 1 % GEL  . isosorbide mononitrate (IMDUR) 30 MG 24 hr tablet  . lisinopril (PRINIVIL,ZESTRIL) 2.5 MG tablet  . Multiple Vitamin (MULTIVITAMIN WITH MINERALS) TABS tablet  . nitroGLYCERIN (NITROSTAT) 0.4 MG SL tablet  . pantoprazole (PROTONIX) 40 MG tablet  . polyvinyl alcohol (ARTIFICIAL TEARS) 1.4 % ophthalmic solution  . warfarin (COUMADIN) 5 MG tablet     Myra Gianotti, PA-C Surgical Short Stay/Anesthesiology [facility] Phone [phone] [facility] Phone [phone] 05/23/2019 12:47 PM

## 2019-05-23 NOTE — Anesthesia Preprocedure Evaluation (Addendum)
Anesthesia Evaluation  Patient identified by MRN, date of birth, ID band Patient awake    Reviewed: Allergy & Precautions, NPO status , Patient's Chart, lab work & pertinent test results  History of Anesthesia Complications Negative for: history of anesthetic complications  Airway Mallampati: II  TM Distance: >3 FB Neck ROM: Full    Dental  (+) Dental Advisory Given   Pulmonary neg pulmonary ROS,    breath sounds clear to auscultation       Cardiovascular hypertension, Pt. on medications (-) angina+ CAD, + Past MI, + Cardiac Stents, + CABG and + Peripheral Vascular Disease  + dysrhythmias + Valvular Problems/Murmurs  Rhythm:Regular Rate:Normal + Systolic Click- Systolic murmurs  S/p left subclavian bypass S/p Bentall, AVR  Carotid US 06/21/18: b/l 1-39% ICAS. Patent left common carotid to subclavian artery bypass graft withelevated velocities.  TTE 05/22/18: EF 60% to 65%. Grade 1 diastolic dysfunction. Mechanical AV. Transvalvulargradient is higher than expected. Peak velocity (S): 413 cm/s. 280cm/s prior ECHO. Valve area(VTI): 0.98 cm^2. Valve area (Vmax): 0.9 cm^2. Valve area(Vmean): 0.91 cm^2. Mild MR, mildly dilated LA.  Cardiac cath 05/21/18: Ost LM lesion is 95% stenosed. A drug-eluting stent was successfully placed using a STENT RESOLUTE ONYX 5.0X12. Post intervention, there is a 0% residual stenosis. IABP placed preprocedure for left main intervention. Prox LAD lesion is 10% stenosed.    Neuro/Psych negative neurological ROS  negative psych ROS   GI/Hepatic Neg liver ROS, GERD  Medicated and Controlled,  Endo/Other  negative endocrine ROS  Renal/GU negative Renal ROS     Musculoskeletal  (+) Arthritis , Rheumatoid disorders,    Abdominal   Peds  Hematology negative hematology ROS (+)   Anesthesia Other Findings   Reproductive/Obstetrics                            Anesthesia Physical Anesthesia Plan  ASA: IV  Anesthesia Plan: General   Post-op Pain Management:    Induction: Intravenous  PONV Risk Score and Plan: 3 and Treatment may vary due to age or medical condition, Ondansetron and Dexamethasone  Airway Management Planned: LMA  Additional Equipment: None  Intra-op Plan:   Post-operative Plan: Extubation in OR  Informed Consent: I have reviewed the patients History and Physical, chart, labs and discussed the procedure including the risks, benefits and alternatives for the proposed anesthesia with the patient or authorized representative who has indicated his/her understanding and acceptance.     Dental advisory given  Plan Discussed with: CRNA and Anesthesiologist  Anesthesia Plan Comments: (  )     Anesthesia Quick Evaluation

## 2019-05-24 MED ORDER — VANCOMYCIN HCL 1000 MG IV SOLR
INTRAVENOUS | Status: AC
  Administered 2019-05-26: 1000 mL
  Filled 2019-05-24: qty 1000

## 2019-05-26 ENCOUNTER — Ambulatory Visit (HOSPITAL_COMMUNITY)
Admission: RE | Admit: 2019-05-26 | Discharge: 2019-05-26 | Disposition: A | Discharge status: Home / Self Care | Payer: Commercial Managed Care - PPO | Attending: Cardiothoracic Surgery | Admitting: Cardiothoracic Surgery

## 2019-05-26 ENCOUNTER — Encounter (HOSPITAL_COMMUNITY)
Admission: RE | Disposition: A | Discharge status: Home / Self Care | Payer: Self-pay | Source: Home / Self Care | Attending: Cardiothoracic Surgery

## 2019-05-26 ENCOUNTER — Other Ambulatory Visit: Payer: Self-pay

## 2019-05-26 ENCOUNTER — Ambulatory Visit (HOSPITAL_COMMUNITY): Payer: Commercial Managed Care - PPO | Admitting: Vascular Surgery

## 2019-05-26 ENCOUNTER — Telehealth: Payer: Self-pay

## 2019-05-26 ENCOUNTER — Encounter (HOSPITAL_COMMUNITY): Payer: Self-pay

## 2019-05-26 DIAGNOSIS — I252 Old myocardial infarction: Secondary | ICD-10-CM | POA: Insufficient documentation

## 2019-05-26 DIAGNOSIS — Z7902 Long term (current) use of antithrombotics/antiplatelets: Secondary | ICD-10-CM | POA: Diagnosis not present

## 2019-05-26 DIAGNOSIS — M069 Rheumatoid arthritis, unspecified: Secondary | ICD-10-CM | POA: Diagnosis not present

## 2019-05-26 DIAGNOSIS — Z79899 Other long term (current) drug therapy: Secondary | ICD-10-CM | POA: Diagnosis not present

## 2019-05-26 DIAGNOSIS — I2510 Atherosclerotic heart disease of native coronary artery without angina pectoris: Secondary | ICD-10-CM | POA: Insufficient documentation

## 2019-05-26 DIAGNOSIS — I255 Ischemic cardiomyopathy: Secondary | ICD-10-CM | POA: Insufficient documentation

## 2019-05-26 DIAGNOSIS — E785 Hyperlipidemia, unspecified: Secondary | ICD-10-CM | POA: Diagnosis not present

## 2019-05-26 DIAGNOSIS — Z951 Presence of aortocoronary bypass graft: Secondary | ICD-10-CM | POA: Diagnosis not present

## 2019-05-26 DIAGNOSIS — T8189XA Other complications of procedures, not elsewhere classified, initial encounter: Secondary | ICD-10-CM

## 2019-05-26 DIAGNOSIS — I739 Peripheral vascular disease, unspecified: Secondary | ICD-10-CM | POA: Insufficient documentation

## 2019-05-26 DIAGNOSIS — T859XXA Unspecified complication of internal prosthetic device, implant and graft, initial encounter: Secondary | ICD-10-CM | POA: Diagnosis present

## 2019-05-26 DIAGNOSIS — I10 Essential (primary) hypertension: Secondary | ICD-10-CM | POA: Diagnosis not present

## 2019-05-26 DIAGNOSIS — Z7901 Long term (current) use of anticoagulants: Secondary | ICD-10-CM | POA: Diagnosis not present

## 2019-05-26 DIAGNOSIS — Z955 Presence of coronary angioplasty implant and graft: Secondary | ICD-10-CM | POA: Diagnosis not present

## 2019-05-26 DIAGNOSIS — Z952 Presence of prosthetic heart valve: Secondary | ICD-10-CM | POA: Insufficient documentation

## 2019-05-26 DIAGNOSIS — Y831 Surgical operation with implant of artificial internal device as the cause of abnormal reaction of the patient, or of later complication, without mention of misadventure at the time of the procedure: Secondary | ICD-10-CM | POA: Insufficient documentation

## 2019-05-26 HISTORY — PX: STERNAL WIRES REMOVAL: SHX2441

## 2019-05-26 LAB — PROTIME-INR
INR: 1.1 (ref 0.8–1.2)
Prothrombin Time: 14.4 seconds (ref 11.4–15.2)

## 2019-05-26 LAB — COMPREHENSIVE METABOLIC PANEL
ALT: 31 U/L (ref 0–44)
AST: 38 U/L (ref 15–41)
Albumin: 3.8 g/dL (ref 3.5–5.0)
Alkaline Phosphatase: 76 U/L (ref 38–126)
Anion gap: 11 (ref 5–15)
BUN: 14 mg/dL (ref 8–23)
CO2: 22 mmol/L (ref 22–32)
Calcium: 9.2 mg/dL (ref 8.9–10.3)
Chloride: 109 mmol/L (ref 98–111)
Creatinine, Ser: 0.96 mg/dL (ref 0.44–1.00)
GFR calc Af Amer: >60 mL/min (ref >60)
GFR calc non Af Amer: >60 mL/min (ref >60)
Glucose, Bld: 88 mg/dL (ref 70–99)
Potassium: 4.4 mmol/L (ref 3.5–5.1)
Sodium: 142 mmol/L (ref 135–145)
Total Bilirubin: 1.4 mg/dL — ABNORMAL HIGH (ref 0.3–1.2)
Total Protein: 6.4 g/dL — ABNORMAL LOW (ref 6.5–8.1)

## 2019-05-26 LAB — SURGICAL PCR SCREEN
MRSA, PCR: NEGATIVE
Staphylococcus aureus: POSITIVE — AB

## 2019-05-26 LAB — CBC
HCT: 41.1 % (ref 36.0–46.0)
Hemoglobin: 12.9 g/dL (ref 12.0–15.0)
MCH: 26.3 pg (ref 26.0–34.0)
MCHC: 31.4 g/dL (ref 30.0–36.0)
MCV: 83.7 fL (ref 80.0–100.0)
Platelets: 191 10*3/uL (ref 150–400)
RBC: 4.91 MIL/uL (ref 3.87–5.11)
RDW: 15.9 % — ABNORMAL HIGH (ref 11.5–15.5)
WBC: 4.2 10*3/uL (ref 4.0–10.5)
nRBC: 0 % (ref 0.0–0.2)

## 2019-05-26 LAB — APTT: aPTT: 30 seconds (ref 24–36)

## 2019-05-26 SURGERY — REMOVAL, STERNAL WIRE
Anesthesia: General

## 2019-05-26 MED ORDER — FENTANYL CITRATE (PF) 100 MCG/2ML IJ SOLN
25.0000 ug | INTRAMUSCULAR | Status: DC | PRN
  Administered 2019-05-26: 13:00:00 50 ug via INTRAVENOUS

## 2019-05-26 MED ORDER — ONDANSETRON HCL 4 MG/2ML IJ SOLN
INTRAMUSCULAR | Status: DC | PRN
  Administered 2019-05-26: 4 mg via INTRAVENOUS

## 2019-05-26 MED ORDER — MIDAZOLAM HCL 5 MG/5ML IJ SOLN
INTRAMUSCULAR | Status: DC | PRN
  Administered 2019-05-26: 2 mg via INTRAVENOUS

## 2019-05-26 MED ORDER — ONDANSETRON HCL 4 MG/2ML IJ SOLN
INTRAMUSCULAR | Status: AC
  Filled 2019-05-26: qty 2

## 2019-05-26 MED ORDER — PHENYLEPHRINE HCL (PRESSORS) 10 MG/ML IV SOLN
INTRAVENOUS | Status: DC | PRN
  Administered 2019-05-26: 80 ug via INTRAVENOUS

## 2019-05-26 MED ORDER — MIDAZOLAM HCL 2 MG/2ML IJ SOLN
INTRAMUSCULAR | Status: AC
  Filled 2019-05-26: qty 2

## 2019-05-26 MED ORDER — ACETAMINOPHEN 325 MG PO TABS: 650.0000 mg | ORAL_TABLET | ORAL | Status: DC | PRN

## 2019-05-26 MED ORDER — CEFAZOLIN SODIUM-DEXTROSE 2-4 GM/100ML-% IV SOLN
2.0000 g | INTRAVENOUS | Status: AC
  Administered 2019-05-26: 2 g via INTRAVENOUS
  Filled 2019-05-26: qty 100

## 2019-05-26 MED ORDER — FENTANYL CITRATE (PF) 100 MCG/2ML IJ SOLN
25.0000 ug | INTRAMUSCULAR | Status: DC | PRN
  Administered 2019-05-26: 50 ug via INTRAVENOUS

## 2019-05-26 MED ORDER — OXYCODONE HCL 5 MG PO TABS: 5.0000 mg | ORAL_TABLET | Freq: Once | ORAL | Status: DC | PRN

## 2019-05-26 MED ORDER — OXYCODONE HCL 5 MG PO TABS
5.0000 mg | ORAL_TABLET | ORAL | Status: DC | PRN
  Administered 2019-05-26: 10 mg via ORAL

## 2019-05-26 MED ORDER — STERILE WATER FOR IRRIGATION IR SOLN
Status: DC | PRN
  Administered 2019-05-26: 1000 mL

## 2019-05-26 MED ORDER — LACTATED RINGERS IV SOLN
INTRAVENOUS | Status: DC
  Administered 2019-05-26: 09:00:00 via INTRAVENOUS

## 2019-05-26 MED ORDER — SODIUM CHLORIDE 0.9 % IR SOLN
Status: DC | PRN
  Administered 2019-05-26: 1000 mL

## 2019-05-26 MED ORDER — OXYCODONE HCL 5 MG PO TABS
ORAL_TABLET | ORAL | Status: AC
  Administered 2019-05-26: 14:00:00 10 mg via ORAL
  Filled 2019-05-26: qty 2

## 2019-05-26 MED ORDER — MUPIROCIN 2 % EX OINT
1.0000 "application " | TOPICAL_OINTMENT | Freq: Once | CUTANEOUS | Status: AC
  Administered 2019-05-26: 1 via TOPICAL
  Filled 2019-05-26: qty 22

## 2019-05-26 MED ORDER — PROPOFOL 10 MG/ML IV BOLUS
INTRAVENOUS | Status: DC | PRN
  Administered 2019-05-26: 60 mg via INTRAVENOUS
  Administered 2019-05-26: 140 mg via INTRAVENOUS

## 2019-05-26 MED ORDER — FENTANYL CITRATE (PF) 100 MCG/2ML IJ SOLN
INTRAMUSCULAR | Status: DC | PRN
  Administered 2019-05-26 (×2): 50 ug via INTRAVENOUS

## 2019-05-26 MED ORDER — ACETAMINOPHEN 650 MG RE SUPP: 650.0000 mg | RECTAL | Status: DC | PRN

## 2019-05-26 MED ORDER — FENTANYL CITRATE (PF) 250 MCG/5ML IJ SOLN
INTRAMUSCULAR | Status: AC
  Filled 2019-05-26: qty 5

## 2019-05-26 MED ORDER — DEXAMETHASONE SODIUM PHOSPHATE 10 MG/ML IJ SOLN
INTRAMUSCULAR | Status: AC
  Filled 2019-05-26: qty 1

## 2019-05-26 MED ORDER — ONDANSETRON HCL 4 MG/2ML IJ SOLN: 4.0000 mg | Freq: Once | INTRAMUSCULAR | Status: DC | PRN

## 2019-05-26 MED ORDER — SODIUM CHLORIDE 0.9 % IV SOLN: 250.0000 mL | INTRAVENOUS | Status: DC | PRN

## 2019-05-26 MED ORDER — SODIUM CHLORIDE 0.9% FLUSH: 3.0000 mL | Freq: Two times a day (BID) | INTRAVENOUS | Status: DC

## 2019-05-26 MED ORDER — LIDOCAINE 2% (20 MG/ML) 5 ML SYRINGE
INTRAMUSCULAR | Status: AC
  Filled 2019-05-26: qty 5

## 2019-05-26 MED ORDER — PROPOFOL 10 MG/ML IV BOLUS
INTRAVENOUS | Status: AC
  Filled 2019-05-26: qty 20

## 2019-05-26 MED ORDER — LIDOCAINE 2% (20 MG/ML) 5 ML SYRINGE
INTRAMUSCULAR | Status: DC | PRN
  Administered 2019-05-26: 60 mg via INTRAVENOUS

## 2019-05-26 MED ORDER — FENTANYL CITRATE (PF) 100 MCG/2ML IJ SOLN
INTRAMUSCULAR | Status: AC
  Administered 2019-05-26: 50 ug via INTRAVENOUS
  Filled 2019-05-26: qty 2

## 2019-05-26 MED ORDER — DEXAMETHASONE SODIUM PHOSPHATE 10 MG/ML IJ SOLN
INTRAMUSCULAR | Status: DC | PRN
  Administered 2019-05-26: 4 mg via INTRAVENOUS

## 2019-05-26 MED ORDER — ROCURONIUM BROMIDE 10 MG/ML (PF) SYRINGE
PREFILLED_SYRINGE | INTRAVENOUS | Status: AC
  Filled 2019-05-26: qty 10

## 2019-05-26 MED ORDER — SODIUM CHLORIDE 0.9% FLUSH: 3.0000 mL | INTRAVENOUS | Status: DC | PRN

## 2019-05-26 MED ORDER — OXYCODONE HCL 5 MG/5ML PO SOLN: 5.0000 mg | Freq: Once | ORAL | Status: DC | PRN

## 2019-05-26 SURGICAL SUPPLY — 58 items
APL SKNCLS STERI-STRIP NONHPOA (GAUZE/BANDAGES/DRESSINGS) ×1
ATTRACTOMAT 16X20 MAGNETIC DRP (DRAPES) ×1 IMPLANT
BAG DECANTER FOR FLEXI CONT (MISCELLANEOUS) ×3 IMPLANT
BENZOIN TINCTURE PRP APPL 2/3 (GAUZE/BANDAGES/DRESSINGS) ×2 IMPLANT
BLADE CLIPPER SURG (BLADE) ×1 IMPLANT
BLADE SURG 10 STRL SS (BLADE) ×2 IMPLANT
CANISTER SUCT 3000ML PPV (MISCELLANEOUS) ×3 IMPLANT
CLIP VESOCCLUDE SM WIDE 24/CT (CLIP) IMPLANT
CLOSURE WOUND 1/2 X4 (GAUZE/BANDAGES/DRESSINGS) ×1
CONT SPEC 4OZ CLIKSEAL STRL BL (MISCELLANEOUS) IMPLANT
COVER SURGICAL LIGHT HANDLE (MISCELLANEOUS) ×2 IMPLANT
COVER WAND RF STERILE (DRAPES) ×1 IMPLANT
DRAPE INCISE IOBAN 85X60 (DRAPES) ×2 IMPLANT
DRAPE LAPAROSCOPIC ABDOMINAL (DRAPES) ×3 IMPLANT
DRAPE WARM FLUID 44X44 (DRAPES) ×2 IMPLANT
DRSG AQUACEL AG ADV 3.5X 6 (GAUZE/BANDAGES/DRESSINGS) ×2 IMPLANT
DRSG AQUACEL AG ADV 3.5X14 (GAUZE/BANDAGES/DRESSINGS) ×1 IMPLANT
ELECT REM PT RETURN 9FT ADLT (ELECTROSURGICAL) ×3
ELECTRODE REM PT RTRN 9FT ADLT (ELECTROSURGICAL) ×1 IMPLANT
GAUZE SPONGE 4X4 12PLY STRL (GAUZE/BANDAGES/DRESSINGS) ×1 IMPLANT
GAUZE XEROFORM 5X9 LF (GAUZE/BANDAGES/DRESSINGS) IMPLANT
GLOVE BIO SURGEON STRL SZ 6.5 (GLOVE) ×4 IMPLANT
GLOVE BIO SURGEON STRL SZ7.5 (GLOVE) ×6 IMPLANT
GLOVE BIO SURGEONS STRL SZ 6.5 (GLOVE) ×2
GLOVE BIOGEL PI IND STRL 6.5 (GLOVE) IMPLANT
GLOVE BIOGEL PI INDICATOR 6.5 (GLOVE) ×2
GOWN STRL REUS W/ TWL LRG LVL3 (GOWN DISPOSABLE) ×4 IMPLANT
GOWN STRL REUS W/TWL LRG LVL3 (GOWN DISPOSABLE) ×12
HEMOSTAT POWDER SURGIFOAM 1G (HEMOSTASIS) IMPLANT
HEMOSTAT SURGICEL 2X14 (HEMOSTASIS) IMPLANT
KIT BASIN OR (CUSTOM PROCEDURE TRAY) ×3 IMPLANT
KIT TURNOVER KIT B (KITS) ×3 IMPLANT
MARKER SKIN DUAL TIP RULER LAB (MISCELLANEOUS) IMPLANT
NS IRRIG 1000ML POUR BTL (IV SOLUTION) ×3 IMPLANT
PACK CHEST (CUSTOM PROCEDURE TRAY) ×1 IMPLANT
PACK GENERAL/GYN (CUSTOM PROCEDURE TRAY) ×2 IMPLANT
PAD ARMBOARD 7.5X6 YLW CONV (MISCELLANEOUS) ×2 IMPLANT
SLEEVE SCD COMPRESS KNEE MED (MISCELLANEOUS) ×2 IMPLANT
SOL PREP POV-IOD 4OZ 10% (MISCELLANEOUS) IMPLANT
SPONGE LAP 18X18 RF (DISPOSABLE) ×3 IMPLANT
STRIP CLOSURE SKIN 1/2X4 (GAUZE/BANDAGES/DRESSINGS) ×1 IMPLANT
SUT ETHILON 3 0 FSL (SUTURE) IMPLANT
SUT STEEL 6MS V (SUTURE) IMPLANT
SUT STEEL STERNAL CCS#1 18IN (SUTURE) IMPLANT
SUT STEEL SZ 6 DBL 3X14 BALL (SUTURE) IMPLANT
SUT VIC AB 1 CTX 18 (SUTURE) ×2 IMPLANT
SUT VIC AB 1 CTX 36 (SUTURE)
SUT VIC AB 1 CTX36XBRD ANBCTR (SUTURE) IMPLANT
SUT VIC AB 2-0 CTX 27 (SUTURE) IMPLANT
SUT VIC AB 3-0 X1 27 (SUTURE) ×4 IMPLANT
SWAB COLLECTION DEVICE MRSA (MISCELLANEOUS) IMPLANT
SWAB CULTURE ESWAB REG 1ML (MISCELLANEOUS) IMPLANT
TOWEL GREEN STERILE (TOWEL DISPOSABLE) ×3 IMPLANT
TOWEL GREEN STERILE FF (TOWEL DISPOSABLE) ×3 IMPLANT
TUBE CONNECTING 20'X1/4 (TUBING) ×1
TUBE CONNECTING 20X1/4 (TUBING) ×1 IMPLANT
WATER STERILE IRR 1000ML POUR (IV SOLUTION) ×3 IMPLANT
YANKAUER SUCT BULB TIP NO VENT (SUCTIONS) ×2 IMPLANT

## 2019-05-26 NOTE — Discharge Instructions (Addendum)
Keep surgical dressing dry and in place until thurs am 8-27 Resume coumadin normal dose this pm 8-24 Resume plavix normal dose tomorrow am 8-25 [address] until 3-35 Donot return to work until Aug 31 [1 week] Office will call for followup appt

## 2019-05-26 NOTE — Telephone Encounter (Signed)
LMOMED OVERDUE INR

## 2019-05-26 NOTE — Brief Op Note (Signed)
05/26/2019  12:32 PM  PATIENT:  [name]  68 y.o. female  PRE-OPERATIVE DIAGNOSIS:  PAINFUL STERNAL WIRE  POST-OPERATIVE DIAGNOSIS:  PAINFUL STERNAL WIRE  PROCEDURE:  Procedure(s): STERNAL WIRE REMOVAL (N/A)  SURGEON:  Surgeon(s) and Role:    Ivin Poot, MD - Primary  PHYSICIAN ASSISTANT:   ASSISTANTS: none   ANESTHESIA:   general  EBL:  25 mL   BLOOD ADMINISTERED:none  DRAINS: none   LOCAL MEDICATIONS USED:  NONE  SPECIMEN:  No Specimen  DISPOSITION OF SPECIMEN:  N/A  COUNTS:  YES  TOURNIQUET:  * No tourniquets in log *  DICTATION: .Dragon Dictation  PLAN OF CARE: Discharge to home after PACU  PATIENT DISPOSITION:  PACU - hemodynamically stable.   Delay start of Pharmacological VTE agent (>24hrs) due to surgical blood loss or risk of bleeding: yes

## 2019-05-26 NOTE — Anesthesia Postprocedure Evaluation (Signed)
Anesthesia Post Note  Patient: [name]  Procedure(s) Performed: STERNAL WIRE REMOVAL (N/A )     Patient location during evaluation: PACU Anesthesia Type: General Level of consciousness: awake and alert Pain management: pain level controlled Vital Signs Assessment: post-procedure vital signs reviewed and stable Respiratory status: spontaneous breathing, nonlabored ventilation and respiratory function stable Cardiovascular status: blood pressure returned to baseline and stable Postop Assessment: no apparent nausea or vomiting Anesthetic complications: no    Last Vitals:  Vitals:   05/26/19 1330 05/26/19 1348  BP: (!) 180/78 (!) 160/80  Pulse: 64 66  Resp: 16   Temp: (!) 36.4 C   SpO2: 98% 98%    Last Pain:  Vitals:   05/26/19 1330  TempSrc:   PainSc: [name]

## 2019-05-26 NOTE — H&P (Signed)
PCP is Martha ClanShaw, William, MD Referring Provider is No ref. provider found  No chief complaint on file.   HPI: Patient returns for painful lower sternal wire.  6 years ago she had redo Bentall procedure.  Recently the lower sternal wire is been painful.  Chest x-ray shows it the pointing straight out.  Her cardiac function is stable.  Echocardiogram 2019 shows normal LV function, normal functioning mechanical aortic valve without AI.  She is in sinus rhythm.  Patient is taking Plavix and Coumadin which will be stopped prior to the removal of the sternal wire under general anesthesia through a small incision.   Past Medical History:  Diagnosis Date  . Ascending aortic aneurysm (HCC)    a.  redo replacement of fusiform aneurysm of ascending aorta (complicated by pericardial hematoma s/p evacuation) in 2014.  Marland Kitchen. Contrast media allergy   . Coronary artery disease    a. s/p CABG x2 in 2014. b. s/p DES to SVG-LAD in 2017.  . H/O mechanical aortic valve replacement   . Hyperlipidemia   . Hypertension   . Ischemic cardiomyopathy    a. EF 40-45% in 2017 at time of NSTEMI, improved to normal in 2018.  . Non-STEMI (non-ST elevated myocardial infarction) (HCC)   . Premature atrial contractions   . PVC's (premature ventricular contractions)   . RA (rheumatoid arthritis) (HCC)   . S/P AVR (aortic valve replacement) 08/1999   St. Jude AVR for aortic insuff, Goal INR 2.5-3.5  . 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 Donata ClayVan Trigt) - complicated by pericardial hematoma --> subxiphoid pericardial exposure and evacuation b. Cath 06/2016: DES to SVG-LAD ostium  . Sarcoid   . Second degree AV block    a. ? possibly seen in 12/2016 clinic visit - EKG not truly suggestive  . Severe anemia   . 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 s/p left subclavian bypass.    Past Surgical History:  Procedure Laterality Date  . AORTIC  VALVE REPLACEMENT  08/23/1999   23mm St. Jude, Bentall procedure, root replacemtn   . BYPASS GRAFT ANGIOGRAPHY  05/20/2018   Procedure: BYPASS GRAFT ANGIOGRAPHY;  Surgeon: Corky CraftsVaranasi, Jayadeep S, MD;  [facility];  Service: Cardiovascular;;  . CARDIAC CATHETERIZATION  08/1999   normal L main, LAD & first diagonal are normal, normal Cfx, RCA with mild ostial narrowing, normal LV systolic function, severe AI, mild MR, normal PAP (Dr. Mervyn SkeetersA. Little)   . CARDIAC CATHETERIZATION N/A 06/22/2016   Procedure: Left Heart Cath and Cors/Grafts Angiography;  Surgeon: Marykay Lexavid W Harding, MD;  [facility];  Service: Cardiovascular;  Laterality: N/A;  . CARDIAC CATHETERIZATION N/A 06/23/2016   Procedure: Left Heart Cath and Coronary Angiography;  Surgeon: Tonny BollmanMichael Cooper, MD;  [facility];  Service: Cardiovascular;  Laterality: N/A;  . CARDIAC CATHETERIZATION N/A 06/23/2016   Procedure: Coronary Stent Intervention;  Surgeon: Tonny BollmanMichael Cooper, MD;  [facility];  Service: Cardiovascular;  Laterality: N/A;  . CORONARY ARTERY BYPASS GRAFT  11/05/2012   Procedure: CORONARY ARTERY BYPASS GRAFTING (CABG);  Surgeon: Kerin PernaPeter Van Trigt, MD;  [facility];  Service: Open Heart Surgery;  Laterality: N/A;  cannulate right subclavian  . CORONARY STENT INTERVENTION N/A 05/21/2018   Procedure: CORONARY STENT INTERVENTION;  Surgeon: Corky CraftsVaranasi, Jayadeep S, MD;  [facility];  Service: Cardiovascular;  Laterality: N/A;  . ENDOVEIN HARVEST OF GREATER SAPHENOUS VEIN  11/05/2012   Procedure: ENDOVEIN HARVEST OF GREATER SAPHENOUS VEIN;  Surgeon: Kerin Perna, MD;  Location: [facility] OR;  Service: Open Heart Surgery;  Laterality: Right;  . 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 GROIN DEBRIDEMENT Left 06/11/2018    Procedure: EXPLORATION LEFT GROIN FOR BLEEDING;  Surgeon: Chuck Hint, MD;  Location: [facility] OR;  Service: Vascular;  Laterality: Left;  . IABP INSERTION N/A 05/21/2018   Procedure: IABP INSERTION;  Surgeon: Corky Crafts, MD;  [facility] LAB;  Service: Cardiovascular;  Laterality: N/A;  . INTRAOPERATIVE TRANSESOPHAGEAL ECHOCARDIOGRAM  11/05/2012   Procedure: INTRAOPERATIVE TRANSESOPHAGEAL ECHOCARDIOGRAM;  Surgeon: Kerin Perna, MD;  Location: [facility] OR;  Service: Open Heart Surgery;  Laterality: N/A;  . INTRAOPERATIVE TRANSESOPHAGEAL ECHOCARDIOGRAM N/A 11/21/2012   Procedure: INTRAOPERATIVE TRANSESOPHAGEAL ECHOCARDIOGRAM;  Surgeon: Kerin Perna, MD;  Location: [facility] OR;  Service: Open Heart Surgery;  Laterality: N/A;  . LEFT HEART CATHETERIZATION WITH CORONARY ANGIOGRAM N/A 11/04/2012   Procedure: LEFT HEART CATHETERIZATION WITH CORONARY ANGIOGRAM;  Surgeon: Chrystie Nose, MD;  Location: [facility] CATH LAB;  Service: Cardiovascular;  Laterality: N/A;  . MEDIASTINOSCOPY  08/2005   bronchoscopy & mediastinoscopy for mediastinal adenopathy (Dr. Donata Clay)   . NM MYOCAR PERF WALL MOTION  01/2012   lexiscan; small, fixed apical septal breast attenuation artifact & inferolateral bowel attenuation, no reversible ischemia, post-stress EF 64%, low risk scan   . PERICARDIAL WINDOW N/A 11/21/2012   Procedure: PERICARDIAL WINDOW;  Surgeon: Kerin Perna, MD;  Location: [facility] OR;  Service: Thoracic;  Laterality: N/A;  . REPLACEMENT ASCENDING AORTA  11/05/2012   Procedure: REPLACEMENT ASCENDING AORTA;  Surgeon: Kerin Perna, MD;  Location: [facility] OR;  Service: Open Heart Surgery;  Laterality: N/A;  . SUBCLAVIAN ANGIOGRAM  05/10/2000   left subclavian arteriogram; patent graft, mild stenosis (Dr. Amada Kingfisher)   . SUBCLAVIAN BYPASS GRAFT  05/16/1999   left subclavian bypass with 19mm Darcon graft (Dr. Amada Kingfisher)  . TRANSTHORACIC ECHOCARDIOGRAM  11/20/2012   EF 65-70%, mild conc LVH, grade 2 diastolic  dysfunction, mech AV, mild MR    Family History  Problem Relation Age of Onset  . Hypertension Mother   . Colon cancer Neg Hx   . Esophageal cancer Neg Hx   . Pancreatic cancer Neg Hx   . Stomach cancer Neg Hx   . Liver disease Neg Hx     Social History Social History   Tobacco Use  . Smoking status: Never Smoker  . Smokeless tobacco: Never Used  Substance Use Topics  . Alcohol use: No  . Drug use: No    Current Facility-Administered Medications  Medication Dose Route Frequency Provider Last Rate Last Dose  . ceFAZolin (ANCEF) IVPB 2g/100 mL premix  2 g Intravenous 30 min Pre-Op Donata Clay, Theron Arista, MD      . lactated ringers infusion   Intravenous Continuous Beryle Lathe, MD 10 mL/hr at 05/26/19 [phone]    . vancomycin (VANCOCIN) 1,000 mg in sodium chloride 0.9 % 1,000 mL irrigation   Irrigation To OR Kerin Perna, MD        Allergies  Allergen Reactions  . Contrast Media [Iodinated Diagnostic Agents] Hives, Itching and Swelling    Review of Systems  No symptoms of  cough fever headache or change in taste Weight stable No shortness of breath No chest pain No bleeding problems but easy bruisability  BP (!) 123/50   Pulse 76   Temp 97.9 F (36.6 C) (Oral)   Resp 17   Ht 5\' 6"  (1.676 m)   Wt 73.5 kg   SpO2 100%   BMI 26.15 kg/m  Physical Exam      Exam    General- alert and comfortable    Neck- no JVD, no cervical adenopathy palpable, no carotid bruit   Lungs- clear without rales, wheezes   Cor- regular rate and rhythm, normal mechanical valve closure click without gallop   Abdomen- soft, non-tender   Extremities - warm, non-tender, minimal edema   Neuro- oriented, appropriate, no focal weakness   Diagnostic Tests: Chest x-ray performed today personally reviewed showing the lower sternal wire to be sticking straight out  Impression: Painful sternal wire.  This can be removed under general anesthesia with minimal risk with a small incision.  She will  need to stop her Coumadin and Plavix now and surgical be in 4 days.  Plan: Return to [facility] hospital for outpatient surgery to remove painful lower sternal wire.   Len Childs, MD [facility] [phone]

## 2019-05-26 NOTE — Anesthesia Procedure Notes (Signed)
Procedure Name: LMA Insertion Date/Time: 05/26/2019 11:35 AM Performed by: Moshe Salisbury, CRNA Pre-anesthesia Checklist: Patient identified, Emergency Drugs available, Suction available and Patient being monitored Patient Re-evaluated:Patient Re-evaluated prior to induction Oxygen Delivery Method: Circle System Utilized Preoxygenation: Pre-oxygenation with 100% oxygen Induction Type: IV induction Ventilation: Mask ventilation without difficulty LMA: LMA inserted LMA Size: 4.0 Number of attempts: 1 Placement Confirmation: positive ETCO2 Tube secured with: Tape Dental Injury: Teeth and Oropharynx as per pre-operative assessment

## 2019-05-26 NOTE — Progress Notes (Signed)
Per patient, [name] (son) to be called with updates post-op. [phone]

## 2019-05-26 NOTE — Progress Notes (Signed)
Pre Procedure note for inpatients:   [name] has been scheduled for Procedure(s): STERNAL WIRE REMOVAL (N/A) today. The various methods of treatment have been discussed with the patient. After consideration of the risks, benefits and treatment options the patient has consented to the planned procedure.   The patient has been seen and labs reviewed. There are no changes in the patient's condition to prevent proceeding with the planned procedure today.  Recent labs:  Lab Results  Component Value Date   WBC 4.2 05/26/2019   HGB 12.9 05/26/2019   HCT 41.1 05/26/2019   PLT 191 05/26/2019   GLUCOSE 88 05/26/2019   CHOL 107 06/08/2018   TRIG 82 06/08/2018   HDL 51 06/08/2018   LDLCALC 40 06/08/2018   ALT 31 05/26/2019   AST 38 05/26/2019   NA 142 05/26/2019   K 4.4 05/26/2019   CL 109 05/26/2019   CREATININE 0.96 05/26/2019   BUN 14 05/26/2019   CO2 22 05/26/2019   TSH 1.910 05/17/2018   INR 1.1 05/26/2019   HGBA1C 6.0 (H) 05/24/2018    Len Childs, MD 05/26/2019 11:23 AM

## 2019-05-26 NOTE — Transfer of Care (Signed)
Immediate Anesthesia Transfer of Care Note  Patient: [name]  Procedure(s) Performed: STERNAL WIRE REMOVAL (N/A )  Patient Location: PACU  Anesthesia Type:General  Level of Consciousness: drowsy and patient cooperative  Airway & Oxygen Therapy: Patient Spontanous Breathing and Patient connected to nasal cannula oxygen  Post-op Assessment: Report given to RN, Post -op Vital signs reviewed and stable and Patient moving all extremities  Post vital signs: Reviewed and stable  Last Vitals:  Vitals Value Taken Time  BP    Temp    Pulse    Resp    SpO2      Last Pain:  Vitals:   05/26/19 0955  TempSrc:   PainSc: 0-No pain         Complications: No apparent anesthesia complications

## 2019-05-27 ENCOUNTER — Encounter (HOSPITAL_COMMUNITY): Payer: Self-pay | Admitting: Cardiothoracic Surgery

## 2019-05-27 NOTE — Op Note (Signed)
NAMEPENDA, [name] MEDICAL RECORD [id number] ACCOUNT [id number] DATE OF BIRTH:[birth year] FACILITY: [facility] LOCATION: [facility] PHYSICIAN:Imaad Reuss VAN TRIGT III, MD  OPERATIVE REPORT  DATE OF PROCEDURE:  05/26/2019  OPERATION:  Removal of painful lower sternal wire.  SURGEON:  Tharon Aquas Trigt III, MD  ANESTHESIA:  General.  PREOPERATIVE DIAGNOSIS:  Status post Bentall procedure 20 years ago with migration and pain related to a lower sternal wire.  POSTOPERATIVE DIAGNOSIS:  Status post Bental procedure 20 years ago with migration and pain related to a lower sternal wire.    DESCRIPTION OF PROCEDURE:  The patient was brought from preop holding where informed consent was documented and final questions were addressed.  The patient had been having pain over the area of her lower sternal wire where the twisted portion had  migrated and was pushing towards the skin.  The patient had stopped her anticoagulation for 72 hours prior to the procedure and understood the risks and benefits of this outpatient procedure.  The patient was placed supine on the operating table.  General anesthesia was induced.  The chest was prepped and draped as a sterile field.  A proper time-out was performed.  A small incision was made over the lower sternal scar.  Palpable sternal wires  were identified.  Two sternal wires with the twisted ends were removed from the lower sternal incision.  There was no significant bleeding or signs of infection.  The wound was irrigated and closed in layers using Vicryl and a subcuticular skin stitch.   The patient was then extubated and transferred to recovery room after sterile dressing had been placed on the incision.  LN/NUANCE  D:05/26/2019 T:05/26/2019 JOB:[id number]

## 2019-06-11 ENCOUNTER — Ambulatory Visit: Payer: Commercial Managed Care - PPO | Admitting: Cardiothoracic Surgery

## 2019-06-12 ENCOUNTER — Encounter: Payer: Self-pay | Admitting: Cardiothoracic Surgery

## 2019-06-12 ENCOUNTER — Other Ambulatory Visit: Payer: Self-pay

## 2019-06-12 ENCOUNTER — Ambulatory Visit (INDEPENDENT_AMBULATORY_CARE_PROVIDER_SITE_OTHER): Payer: Self-pay | Admitting: Cardiothoracic Surgery

## 2019-06-12 VITALS — BP 180/73 | HR 48 | Resp 20 | Ht 66.0 in | Wt 161.0 lb

## 2019-06-12 DIAGNOSIS — T8189XA Other complications of procedures, not elsewhere classified, initial encounter: Secondary | ICD-10-CM

## 2019-06-12 DIAGNOSIS — Z09 Encounter for follow-up examination after completed treatment for conditions other than malignant neoplasm: Secondary | ICD-10-CM

## 2019-06-12 NOTE — Progress Notes (Signed)
PCP is Marton Redwood, MD Referring Provider is Marton Redwood, MD  Chief Complaint  Patient presents with  . Routine Post Op    s/p Removal of painful lower sternal wire, 05/26/19    HPI: Patient returns for postop visit after having 2 sternal wires removed as outpatient.  The wires been placed after redo sternotomy for replacement of the central ascending aorta aneurysm.  The wires had been displaced and were pointing into her skin.  Both these wires were removed.  There is no evidence of infection at the time of surgery.  The other wires are intact.  Today the sided incision is healing well.  There is no evidence of cellulitis or nonhealing.  Past Medical History:  Diagnosis Date  . Ascending aortic aneurysm (HCC)    a.  redo replacement of fusiform aneurysm of ascending aorta (complicated by pericardial hematoma s/p evacuation) in 2014.  Marland Kitchen Contrast media allergy   . Coronary artery disease    a. s/p CABG x2 in 2014. b. s/p DES to SVG-LAD in 2017.  . H/O mechanical aortic valve replacement   . Hyperlipidemia   . Hypertension   . Ischemic cardiomyopathy    a. EF 40-45% in 2017 at time of NSTEMI, improved to normal in 2018.  . Non-STEMI (non-ST elevated myocardial infarction) ([place])   . Premature atrial contractions   . PVC's (premature ventricular contractions)   . RA (rheumatoid arthritis) ([place])   . S/P AVR (aortic valve replacement) 08/1999   St. Jude AVR for aortic insuff, Goal INR 2.5-3.5  . 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 Prescott Gum) - complicated by pericardial hematoma --> subxiphoid pericardial exposure and evacuation b. Cath 06/2016: DES to SVG-LAD ostium  . Sarcoid   . Second degree AV block    a. ? possibly seen in 12/2016 clinic visit - EKG not truly suggestive  . Severe anemia   . Subclavian artery stenosis ([place]) 06/2000   a. s/p left subclavian bypass.    Past Surgical History:   Procedure Laterality Date  . AORTIC VALVE REPLACEMENT  08/23/1999   19mm St. Jude, Bentall procedure, root replacemtn   . BYPASS GRAFT ANGIOGRAPHY  05/20/2018   Procedure: BYPASS GRAFT ANGIOGRAPHY;  Surgeon: Jettie Booze, MD;  Location: [place] CV LAB;  Service: Cardiovascular;;  . CARDIAC CATHETERIZATION  08/1999   normal L main, LAD & first diagonal are normal, normal Cfx, RCA with mild ostial narrowing, normal LV systolic function, severe AI, mild MR, normal PAP (Dr. Loni Muse. Little)   . CARDIAC CATHETERIZATION N/A 06/22/2016   Procedure: Left Heart Cath and Cors/Grafts Angiography;  Surgeon: Leonie Man, MD;  Location: [place] CV LAB;  Service: Cardiovascular;  Laterality: N/A;  . CARDIAC CATHETERIZATION N/A 06/23/2016   Procedure: Left Heart Cath and Coronary Angiography;  Surgeon: Sherren Mocha, MD;  Location: [place] CV LAB;  Service: Cardiovascular;  Laterality: N/A;  . CARDIAC CATHETERIZATION N/A 06/23/2016   Procedure: Coronary Stent Intervention;  Surgeon: Sherren Mocha, MD;  Location: [place] CV LAB;  Service: Cardiovascular;  Laterality: N/A;  . CORONARY ARTERY BYPASS GRAFT  11/05/2012   Procedure: CORONARY ARTERY BYPASS GRAFTING (CABG);  Surgeon: Ivin Poot, MD;  Location: [place];  Service: Open Heart Surgery;  Laterality: N/A;  cannulate right subclavian  . CORONARY STENT INTERVENTION N/A 05/21/2018   Procedure: CORONARY STENT INTERVENTION;  Surgeon: Larae Grooms  S, MD;  [facility]: [facility] INVAS[facility] LAB;  Service: Cardiovascular;  Laterality: N/A;  . ENDOVEIN HARVEST OF GREATER SAPHENOUS VEIN  11/05/2012   Procedure: ENDOVEIN HARVEST OF GREATER SAPHENOUS VEIN;  Surgeon: Kerin Perna, MD;  Location: [facility] OR;  Service: Open Heart Surgery;  Laterality: Right;  . 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  GROIN DEBRIDEMENT Left 06/11/2018   Procedure: EXPLORATION LEFT GROIN FOR BLEEDING;  Surgeon: Chuck Hint, MD;  Location: [facility] OR;  Service: Vascular;  Laterality: Left;  . IABP INSERTION N/A 05/21/2018   Procedure: IABP INSERTION;  Surgeon: Corky Crafts, MD;  Location: [facility] INVASIVE CV LAB;  Service: Cardiovascular;  Laterality: N/A;  . INTRAOPERATIVE TRANSESOPHAGEAL ECHOCARDIOGRAM  11/05/2012   Procedure: INTRAOPERATIVE TRANSESOPHAGEAL ECHOCARDIOGRAM;  Surgeon: Kerin Perna, MD;  Location[facility] OR;  Service: Open Heart Surgery;  Laterality: N/A;  . INTRAOPERATIVE TRANSESOPHAGEAL ECHOCARDIOGRAM N/A 11/21/2012   Procedure: INTRAOPERATIVE TRANSESOPHAGEAL ECHOCARDIOGRAM;  Surgeon: Kerin Perna, MD;  Location: [facility] OR;  Service: Open Heart Surgery;  Laterality: N/A;  . LEFT HEART CATHETERIZATION WITH CORONARY ANGIOGRAM N/A 11/04/2012   Procedure: LEFT HEART CATHETERIZATION WITH CORONARY ANGIOGRAM;  Surgeon: Chrystie Nose, MD;  Location: [facility] CATH LAB;  Service: Cardiovascular;  Laterality: N/A;  . MEDIASTINOSCOPY  08/2005   bronchoscopy & mediastinoscopy for mediastinal adenopathy (Dr. Donata Clay)   . NM MYOCAR PERF WALL MOTION  01/2012   lexiscan; small, fixed apical septal breast attenuation artifact & inferolateral bowel attenuation, no reversible ischemia, post-stress EF 64%, low risk scan   . PERICARDIAL WINDOW N/A 11/21/2012   Procedure: PERICARDIAL WINDOW;  Surgeon: Kerin Perna, MD;  Location: [facility] OR;  Service: Thoracic;  Laterality: N/A;  . REPLACEMENT ASCENDING AORTA  11/05/2012   Procedure: REPLACEMENT ASCENDING AORTA;  Surgeon: Kerin Perna, MD;  Location: [facility] OR;  Service: Open Heart Surgery;  Laterality: N/A;  . STERNAL WIRES REMOVAL N/A 05/26/2019   Procedure: STERNAL WIRE REMOVAL;  Surgeon: Kerin Perna, MD;  Location: [facility] OR;  Service: Thoracic;  Laterality: N/A;  . SUBCLAVIAN ANGIOGRAM  05/10/2000   left subclavian arteriogram; patent graft, mild stenosis (Dr. Amada Kingfisher)    . SUBCLAVIAN BYPASS GRAFT  05/16/1999   left subclavian bypass with 13mm Darcon graft (Dr. Amada Kingfisher)  . TRANSTHORACIC ECHOCARDIOGRAM  11/20/2012   EF 65-70%, mild conc LVH, grade 2 diastolic dysfunction, mech AV, mild MR    Family History  Problem Relation Age of Onset  . Hypertension Mother   . Colon cancer Neg Hx   . Esophageal cancer Neg Hx   . Pancreatic cancer Neg Hx   . Stomach cancer Neg Hx   . Liver disease Neg Hx     Social History Social History   Tobacco Use  . Smoking status: Never Smoker  . Smokeless tobacco: Never Used  Substance Use Topics  . Alcohol use: No  . Drug use: No    Current Outpatient Medications  Medication Sig Dispense Refill  . acetaminophen (TYLENOL) 500 MG tablet Take 500 mg by mouth at bedtime as needed (pain).     Marland Kitchen atorvastatin (LIPITOR) 80 MG tablet Take 1 tablet (80 mg total) by mouth daily at 6 PM. 30 tablet 7  . Calcium Carb-Cholecalciferol (CALCIUM 600+D3 PO) Take 1 tablet by mouth daily.     . clopidogrel (PLAVIX)  75 MG tablet TAKE 1 TABLET BY MOUTH EVERY DAY WITH BREAKFAST (Patient taking differently: Take 75 mg by mouth daily. ) 30 tablet 11  . diclofenac sodium (VOLTAREN) 1 % GEL Apply 1 application topically 4 (four) times daily as needed (knee pain).    . isosorbide mononitrate (IMDUR) 30 MG 24 hr tablet Take 1 tablet (30 mg total) by mouth daily. 90 tablet 3  . lisinopril (PRINIVIL,ZESTRIL) 2.5 MG tablet Take 1 tablet (2.5 mg total) by mouth daily. 90 tablet 3  . Multiple Vitamin (MULTIVITAMIN WITH MINERALS) TABS tablet Take 1 tablet by mouth daily.    . nitroGLYCERIN (NITROSTAT) 0.4 MG SL tablet PLACE 1 TABLET (0.4 MG TOTAL) UNDER THE TONGUE EVERY 5 (FIVE) MINUTES AS NEEDED FOR CHEST PAIN. 25 tablet 6  . pantoprazole (PROTONIX) 40 MG tablet TAKE 1 TABLET BY MOUTH EVERY DAY (Patient taking differently: Take 40 mg by mouth daily. ) 30 tablet 10  . polyvinyl alcohol (ARTIFICIAL TEARS) 1.4 % ophthalmic solution Place 1 drop into both  eyes daily.     Marland Kitchen warfarin (COUMADIN) 5 MG tablet TAKE 1/2 TO 1 TABLET BY MOUTH DAILY OR AS DIRECTED BY COUMADIN CLINIC (Patient taking differently: Take 2.5-5 mg by mouth See admin instructions. Take 2.5 mg in the evening on Mon and Thur, Take 5 mg in the evening on Sun, Tue, Wed, Fri, and Sat) 90 tablet 01   No current facility-administered medications for this visit.     Allergies  Allergen Reactions  . Contrast Media [Iodinated Diagnostic Agents] Hives, Itching and Swelling    Review of Systems  No complaints other than bruising of her extremities from taking Plavix with her Coumadin Still works full-time BP (!) 180/73   Pulse (!) 48   Resp 20   Ht 5\' 6"  (1.676 m)   Wt 161 lb (73 kg)   SpO2 98% Comment: RA  BMI 25.99 kg/m No active Physical Exam Alert and comfortable Lungs clear Normal sounding aortic valve closure sound Recent sternal incision is healing well  Diagnostic Tests: No x-ray today Impression: Doing well after sternal wire removal  Plan: Return as needed   Mikey Bussing, MD [facility] [phone]

## 2019-06-19 ENCOUNTER — Telehealth: Payer: Self-pay

## 2019-06-19 NOTE — Telephone Encounter (Signed)
lmom for overdue inr 

## 2019-06-25 ENCOUNTER — Other Ambulatory Visit: Payer: Self-pay | Admitting: Internal Medicine

## 2019-06-27 ENCOUNTER — Ambulatory Visit (HOSPITAL_COMMUNITY)
Admission: RE | Admit: 2019-06-27 | Discharge: 2019-06-27 | Disposition: A | Discharge status: Home / Self Care | Payer: Commercial Managed Care - PPO | Source: Ambulatory Visit | Attending: Cardiology | Admitting: Cardiology

## 2019-06-27 ENCOUNTER — Other Ambulatory Visit: Payer: Self-pay

## 2019-06-27 ENCOUNTER — Other Ambulatory Visit (HOSPITAL_COMMUNITY): Payer: Self-pay | Admitting: Internal Medicine

## 2019-06-27 DIAGNOSIS — I6523 Occlusion and stenosis of bilateral carotid arteries: Secondary | ICD-10-CM

## 2019-07-25 ENCOUNTER — Ambulatory Visit (INDEPENDENT_AMBULATORY_CARE_PROVIDER_SITE_OTHER): Payer: Commercial Managed Care - PPO | Admitting: Pharmacist Clinician (PhC)/ Clinical Pharmacy Specialist

## 2019-07-25 ENCOUNTER — Other Ambulatory Visit: Payer: Self-pay

## 2019-07-25 DIAGNOSIS — Z952 Presence of prosthetic heart valve: Secondary | ICD-10-CM | POA: Diagnosis not present

## 2019-07-25 DIAGNOSIS — Z7901 Long term (current) use of anticoagulants: Secondary | ICD-10-CM

## 2019-07-25 LAB — POCT INR: INR: 1.8 — AB (ref 2.0–3.0)

## 2019-08-15 ENCOUNTER — Ambulatory Visit (INDEPENDENT_AMBULATORY_CARE_PROVIDER_SITE_OTHER): Payer: Commercial Managed Care - PPO | Admitting: Pharmacist

## 2019-08-15 ENCOUNTER — Other Ambulatory Visit: Payer: Self-pay

## 2019-08-15 DIAGNOSIS — Z7901 Long term (current) use of anticoagulants: Secondary | ICD-10-CM | POA: Diagnosis not present

## 2019-08-15 DIAGNOSIS — Z952 Presence of prosthetic heart valve: Secondary | ICD-10-CM

## 2019-08-15 LAB — POCT INR: INR: 1.9 — AB (ref 2.0–3.0)

## 2019-10-31 ENCOUNTER — Ambulatory Visit: Payer: Managed Care, Other (non HMO) | Admitting: Internal Medicine

## 2019-10-31 ENCOUNTER — Other Ambulatory Visit: Payer: Self-pay

## 2019-10-31 ENCOUNTER — Ambulatory Visit (INDEPENDENT_AMBULATORY_CARE_PROVIDER_SITE_OTHER): Payer: Managed Care, Other (non HMO) | Admitting: Pharmacist Clinician (PhC)/ Clinical Pharmacy Specialist

## 2019-10-31 ENCOUNTER — Encounter: Payer: Self-pay | Admitting: Internal Medicine

## 2019-10-31 ENCOUNTER — Encounter (INDEPENDENT_AMBULATORY_CARE_PROVIDER_SITE_OTHER): Payer: Self-pay

## 2019-10-31 VITALS — BP 114/62 | HR 63 | Ht 66.5 in | Wt 164.2 lb

## 2019-10-31 DIAGNOSIS — Z7901 Long term (current) use of anticoagulants: Secondary | ICD-10-CM

## 2019-10-31 DIAGNOSIS — I255 Ischemic cardiomyopathy: Secondary | ICD-10-CM | POA: Diagnosis not present

## 2019-10-31 DIAGNOSIS — Z952 Presence of prosthetic heart valve: Secondary | ICD-10-CM

## 2019-10-31 DIAGNOSIS — I10 Essential (primary) hypertension: Secondary | ICD-10-CM

## 2019-10-31 LAB — POCT INR: INR: 2.1 (ref 2.0–3.0)

## 2019-10-31 NOTE — Progress Notes (Signed)
OFFICE NOTE  Chief Complaint:  Follow-up recent hospitalizations  Primary Care Physician: Marton Redwood, MD  HPI:  Glendale Wherry is a very pleasant 69 year old African American female with a history of St. Jude AVR placed in 2000 for aortic insufficiency. 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 She also has a history of sarcoidosis, dyslipidemia and contrast allergy, which apparently gave her hives. In April 2013, she saw Dr. Rex Kras with complaints of chest pain, for which he then did a Myoview on her, which was normal. She was last seen by Kerin Ransom on October 21, 2012. At that time she was complaining of having chest pressure and shortness of breath. She was set up for a diagnostic heart catheterization. This was completed by Dr. Debara Pickett, which revealed severe ostial left main stenosis greater than 95%. There was also suggestion of possible ostial RCA disease. The ascending aorta was dilated up to 4.8 cm. At that time, Dr. Prescott Gum was asked to consult. Patient was then taken for bypass surgery x2 with a saphenous vein graft to the left anterior descending coronary artery and a saphenous vein graft to the circumflex marginal. 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 Also during that hospitalization, she had development of a pericardial hematoma. Dr. Prescott Gum then did a subxiphoid pericardial exposure and evacuation of the pericardial hematoma. She was seen in follow-up in March 2014 and was doing well.  She had lost about 17 lbs during her hospitalization.  She reports returning to work this past summer and is still able to do most activities. Her INR has been followed in our Coumadin clinic and is actually supratherapeutic today at 4. Her dose will be adjusted. She's not had any further laboratory work or followup since this past Spring.  She denies any chest pain with exertion, but does report some discomfort  associated with her anterior scars as well as her sternal wires. She does occasionally get fatigued with work but this improves with rest.  I saw Mrs. Felch back today in follow-up. She is doing fairly well. She denies any shortness of breath or chest pain. She is annoyed somewhat at her mechanical valve which she says is very loud. She underwent a CT scan this year in March which shows a stable thoracic aneurysm repair at 4.0 cm. 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 In the past she's had left subclavian bypass and will need ultrasound follow-up of that. She's also not had any lab work in the past several months.  Mrs. Blume returns today for follow-up. Overall she seems to be doing pretty well. She says she gets hot and noted that her blood pressure is somewhat elevated from time to time now. It seems as if she's not currently on any blood pressure medicines. Last time I saw her she was not taking lisinopril and her blood pressure was fairly normal therefore I kept her off the medicine. Previous to that she was taken off of her beta blocker. She really has indication for either one of these are both based on her history of coronary disease, coronary artery bypass grafting, valve repair, and aneurysm. Blood pressure is elevated today. She is due for an INR check. Dopplers including AAA screen as well as ultrasound of her subclavian stenosis were performed in October last year and are stable.  07/03/2016  Mrs. Esquer returns today for  follow-up. Unfortunately she recently had some unstable angina and presented to the hospital with chest pain. She underwent cardiac catheterization after being found to have a NSTEMI with elevated troponin of 0.26. Warfarin was held and she was found to have an EF of 40-45% with moderate hypokinesis of the basal inferior myocardium and grade 2 diastolic dysfunction. Her mechanical heart valve was functioning normally. She  then underwent cardiac catheterization which demonstrated a patent SVG to LAD and SVG to OM1. There was 95% stenosis of an ostial left main, 90% ostial first marginal, her percent ostial RCA and 90% ostial LAD disease. Given the difficulty of the case, intervention was deferred until the next day when Dr. Excell Seltzer performed PCI to the ostium of the SVG to LAD. Selective angiography was not possible after the case due to the stent strut hanging out into the aorta however aortic root angiography showed normal flow down the graft and the patient was noted to be chest pain-free. He recommended aspirin, Plavix and warfarin for 3 months, then discontinuing aspirin and continuing warfarin and Plavix. Today she reports no further chest pain. She initially talked to me about disability however after more discussion she understood that if she were to become formally disabled she could never work again and she did want to continue to work part time in addition to having disability. Based on that she wishes to go back to work and she should be able to go back to work as early as October 9.  01/12/2017  Noam returns today for follow-up. She says she feels a bit stronger and does not feel palpitations is bad at night. EKG today however shows sinus rhythm with a second-degree AV block with a rate of 69. Blood pressures accordingly low 96/50. She is on low-dose metoprolol. She recently stopped aspirin is on Plavix and warfarin. She is due for an INR check today. She had a repeat echocardiogram which shows a normally functioning mechanical aortic valve as well as an improvement in EF up to 60-65%. She does report a little bit of fatigue particularly when walking around [place]. She is not significantly active and inoperative spitting cardigan rehabilitation due to her job. I encouraged her to start to do more physical activity as I do not see any cardiac limitation to her symptoms.   08/03/2017  Mrs. Roskos returns today for  follow-up.  Overall she is feeling well.  She occasionally gets some bruising.  She is currently on Plavix and warfarin.  Her INR was recently assessed.  She had a GI bleeding last fall but this is not recurred.  Her LVEF improved to 60-65% as of April 2018.  She denies any recurrent chest pain.  Her valve has been stable.  We did receive lab work from her PCP this summer which showed a well-controlled lipid profile as well as stable hemoglobin of 13.5.  She tells me that she plans to retire next year.  02/22/2018  Mrs. Lauletta was seen today in follow-up.  She continues to do well.  She denies chest pain or worsening shortness of breath.  Her INR today was 2.4 which is slightly low however reasonably well controlled.  She denies any GI bleeding.  LVEF had normalized.  She was planning to retire however it says she is still working.  Blood pressure is well controlled today.  EKG shows sinus rhythm with PVCs and PACs.  She is not symptomatic with this.    11/08/2018  Mrs. Lookabill is seen today in  follow-up.  Unfortunately she has had recent significant hospitalizations.  I saw her in the hospital in August 2019, at which time she presented with chest pain and elevated troponin suggestive of non-STEMI.  She was anticoagulated on warfarin and I recommended admission and heart catheterization however she said that she had some things to take care of and left AGAINST MEDICAL ADVICE.  She then came back to the hospital as promised the next day for direct admission and further cardiac work-up. She did have heart catheterization which showed possible severe in-stent restenosis.  Ultimately she underwent balloon pump supported staged PCI to an ostial left main stenosis which was 95%.  She received a 5.0 x 12 mm Onyx resolute stent.  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  Then it required multiple operations due to recurrent bleeding.  She was admitted several  times to the hospital and then followed up in the office at which time several medication changes were made including taking her off of blood pressure medications.  This was due to dizziness.  Mrs. Micheline Maze says that she does still have some dizziness despite coming off her medicines however blood pressure is higher than it had been previously.  Again she had a history of heart failure in the past however EF had normalized.  I am concerned about redevelopment of cardiomyopathy.  Today she is asymptomatic.  She says she has good energy level.  She has not fatigue.  She denies any chest pains.  INR today was therapeutic.  10/31/2019  Mrs. Harrigan returns today for follow-up.  Finally she seems to be doing well.  She is asymptomatic and denies chest pain or worsening shortness of breath.  Is now been 2 years since her last echo but she had a normally functioning mechanical valve.  Her INR is are stable.  She is excited that she will be moving to a townhouse next week.  PMHx:  Past Medical History:  Diagnosis Date  . Ascending aortic aneurysm (HCC)    a.  redo replacement of fusiform aneurysm of ascending aorta (complicated by pericardial hematoma s/p evacuation) in 2014.  Marland Kitchen Contrast media allergy   . Coronary artery disease    a. s/p CABG x2 in 2014. b. s/p DES to SVG-LAD in 2017.  . H/O mechanical aortic valve replacement   . Hyperlipidemia   . Hypertension   . Ischemic cardiomyopathy    a. EF 40-45% in 2017 at time of NSTEMI, improved to normal in 2018.  . Non-STEMI (non-ST elevated myocardial infarction) (HCC)   . Premature atrial contractions   . PVC's (premature ventricular contractions)   . RA (rheumatoid arthritis) (HCC)   . S/P AVR (aortic valve replacement) 08/1999   St. Jude AVR for aortic insuff, Goal INR 2.5-3.5  . 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 Donata Clay) - complicated by pericardial hematoma -->  subxiphoid pericardial exposure and evacuation b. Cath 06/2016: DES to SVG-LAD ostium  . Sarcoid   . Second degree AV block    a. ? possibly seen in 12/2016 clinic visit - EKG not truly suggestive  . Severe anemia   . 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 s/p left subclavian bypass.    Past Surgical History:  Procedure Laterality Date  . AORTIC VALVE REPLACEMENT  08/23/1999   75mm St. Jude, Bentall procedure, root replacemtn   .  BYPASS GRAFT ANGIOGRAPHY  05/20/2018   Procedure: BYPASS GRAFT ANGIOGRAPHY;  Surgeon: Corky Crafts, MD;  Location: [facility] INVASIVE CV LAB;  Service: Cardiovascular;;  . CARDIAC CATHETERIZATION  08/1999   normal L main, LAD & first diagonal are normal, normal Cfx, RCA with mild ostial narrowing, normal LV systolic function, severe AI, mild MR, normal PAP (Dr. Mervyn Skeeters. [place])   . CARDIAC CATHETERIZATION N/A 06/22/2016   Procedure: Left Heart Cath and Cors/Grafts Angiography;  Surgeon: Marykay Lex, MD;  Location: [facility] INVASIVE CV LAB;  Service: Cardiovascular;  Laterality: N/A;  . CARDIAC CATHETERIZATION N/A 06/23/2016   Procedure: Left Heart Cath and Coronary Angiography;  Surgeon: Tonny Bollman, MD;  Location: [facility] INVASIVE CV LAB;  Service: Cardiovascular;  Laterality: N/A;  . CARDIAC CATHETERIZATION N/A 06/23/2016   Procedure: Coronary Stent Intervention;  Surgeon: Tonny Bollman, MD;  Location: [facility] INVASIVE CV LAB;  Service: Cardiovascular;  Laterality: N/A;  . CORONARY ARTERY BYPASS GRAFT  11/05/2012   Procedure: CORONARY ARTERY BYPASS GRAFTING (CABG);  Surgeon: Kerin Perna, MD;  Location: [facility] OR;  Service: Open Heart Surgery;  Laterality: N/A;  cannulate right subclavian  . CORONARY STENT INTERVENTION N/A 05/21/2018   Procedure: CORONARY STENT INTERVENTION;  Surgeon: Corky Crafts, MD;  Location: [facility] INVASIVE CV LAB;  Service: Cardiovascular;  Laterality: N/A;  . ENDOVEIN HARVEST OF GREATER SAPHENOUS VEIN  11/05/2012   Procedure: ENDOVEIN HARVEST OF  GREATER SAPHENOUS VEIN;  Surgeon: Kerin Perna, MD;  Location: [facility] OR;  Service: Open Heart Surgery;  Laterality: Right;  . FEMORAL-FEMORAL BYPASS GRAFT Left 06/09/2018   Procedure: REPAIR OF LEFT FEMORAL ARTERY PSEUDO ANEURYSM;  Surgeon: Chuck Hint, MD;  Location: [facility] OR;  Service: Vascular;  Laterality: Left;  . GROIN DEBRIDEMENT Left 06/11/2018   Procedure: EXPLORATION LEFT GROIN FOR BLEEDING;  Surgeon: Chuck Hint, MD;  Location: [facility] OR;  Service: Vascular;  Laterality: Left;  . IABP INSERTION N/A 05/21/2018   Procedure: IABP INSERTION;  Surgeon: Corky Crafts, MD;  Location: [facility] INVASIVE CV LAB;  Service: Cardiovascular;  Laterality: N/A;  . INTRAOPERATIVE TRANSESOPHAGEAL ECHOCARDIOGRAM  11/05/2012   Procedure: INTRAOPERATIVE TRANSESOPHAGEAL ECHOCARDIOGRAM;  Surgeon: Kerin Perna, MD;  Location: [facility] OR;  Service: Open Heart Surgery;  Laterality: N/A;  . INTRAOPERATIVE TRANSESOPHAGEAL ECHOCARDIOGRAM N/A 11/21/2012   Procedure: INTRAOPERATIVE TRANSESOPHAGEAL ECHOCARDIOGRAM;  Surgeon: Kerin Perna, MD;  Location: [facility] OR;  Service: Open Heart Surgery;  Laterality: N/A;  . LEFT HEART CATHETERIZATION WITH CORONARY ANGIOGRAM N/A 11/04/2012   Procedure: LEFT HEART CATHETERIZATION WITH CORONARY ANGIOGRAM;  Surgeon: Chrystie Nose, MD;  Location: [facility] CATH LAB;  Service: Cardiovascular;  Laterality: N/A;  . MEDIASTINOSCOPY  08/2005   bronchoscopy & mediastinoscopy for mediastinal adenopathy (Dr. Donata Clay)   . NM MYOCAR PERF WALL MOTION  01/2012   lexiscan; small, fixed apical septal breast attenuation artifact & inferolateral bowel attenuation, no reversible ischemia, post-stress EF 64%, low risk scan   . PERICARDIAL WINDOW N/A 11/21/2012   Procedure: PERICARDIAL WINDOW;  Surgeon: Kerin Perna, MD;  Location: [facility] OR;  Service: Thoracic;  Laterality: N/A;  . REPLACEMENT ASCENDING AORTA  11/05/2012   Procedure: REPLACEMENT ASCENDING AORTA;  Surgeon: Kerin Perna, MD;  Location:  [facility] OR;  Service: Open Heart Surgery;  Laterality: N/A;  . STERNAL WIRES REMOVAL N/A 05/26/2019   Procedure: STERNAL WIRE REMOVAL;  Surgeon: Kerin Perna, MD;  Location: [facility] OR;  Service: Thoracic;  Laterality: N/A;  . SUBCLAVIAN ANGIOGRAM  05/10/2000  left subclavian arteriogram; patent graft, mild stenosis (Dr. Amada Kingfisher)   . SUBCLAVIAN BYPASS GRAFT  05/16/1999   left subclavian bypass with 69mm Darcon graft (Dr. Amada Kingfisher)  . TRANSTHORACIC ECHOCARDIOGRAM  11/20/2012   EF 65-70%, mild conc LVH, grade 2 diastolic dysfunction, mech AV, mild MR    FAMHx:  Family History  Problem Relation Age of Onset  . Hypertension Mother   . Colon cancer Neg Hx   . Esophageal cancer Neg Hx   . Pancreatic cancer Neg Hx   . Stomach cancer Neg Hx   . Liver disease Neg Hx     SOCHx:   reports that she has never smoked. She has never used smokeless tobacco. She reports that she does not drink alcohol or use drugs.  ALLERGIES:  Allergies  Allergen Reactions  . Contrast Media [Iodinated Diagnostic Agents] Hives, Itching and Swelling    ROS: Pertinent items noted in HPI and remainder of comprehensive ROS otherwise negative.  HOME MEDS: Current Outpatient Medications  Medication Sig Dispense Refill  . acetaminophen (TYLENOL) 500 MG tablet Take 500 mg by mouth at bedtime as needed (pain).     Marland Kitchen atorvastatin (LIPITOR) 80 MG tablet TAKE 1 TABLET (80 MG TOTAL) BY MOUTH DAILY AT 6 PM. 90 tablet 1  . Calcium Carb-Cholecalciferol (CALCIUM 600+D3 PO) Take 1 tablet by mouth daily.     . clopidogrel (PLAVIX) 75 MG tablet TAKE 1 TABLET BY MOUTH EVERY DAY WITH BREAKFAST (Patient taking differently: Take 75 mg by mouth daily. ) 30 tablet 11  . isosorbide mononitrate (IMDUR) 30 MG 24 hr tablet Take 1 tablet (30 mg total) by mouth daily. 90 tablet 3  . lisinopril (PRINIVIL,ZESTRIL) 2.5 MG tablet Take 1 tablet (2.5 mg total) by mouth daily. 90 tablet 3  . Multiple Vitamin (MULTIVITAMIN WITH MINERALS) TABS tablet Take 1  tablet by mouth daily.    . nitroGLYCERIN (NITROSTAT) 0.4 MG SL tablet PLACE 1 TABLET (0.4 MG TOTAL) UNDER THE TONGUE EVERY 5 (FIVE) MINUTES AS NEEDED FOR CHEST PAIN. 25 tablet 6  . pantoprazole (PROTONIX) 40 MG tablet TAKE 1 TABLET BY MOUTH EVERY DAY (Patient taking differently: Take 40 mg by mouth daily. ) 30 tablet 10  . polyvinyl alcohol (ARTIFICIAL TEARS) 1.4 % ophthalmic solution Place 1 drop into both eyes daily.     Marland Kitchen warfarin (COUMADIN) 5 MG tablet TAKE 1/2 TO 1 TABLET BY MOUTH DAILY OR AS DIRECTED BY COUMADIN CLINIC (Patient taking differently: Take 2.5-5 mg by mouth See admin instructions. Take 2.5 mg in the evening on Mon and Thur, Take 5 mg in the evening on Sun, Tue, Wed, Fri, and Sat) 90 tablet 01   No current facility-administered medications for this visit.    LABS/IMAGING: No results found for this or any previous visit (from the past 48 hour(s)). No results found.  VITALS: BP 114/62   Pulse 63   Ht 5' 6.5" (1.689 m)   Wt 164 lb 3.2 oz (74.5 kg)   SpO2 100%   BMI 26.11 kg/m   EXAM: General appearance: alert and no distress Neck: no carotid bruit and no JVD Lungs: clear to auscultation bilaterally Heart: regular rate and rhythm and sharp mechanical valve sounds Abdomen: soft, non-tender; bowel sounds normal; no masses,  no organomegaly Extremities: extremities normal, atraumatic, no cyanosis or edema Pulses: 2+ and symmetric Skin: Skin color, texture, turgor normal. No rashes or lesions Neurologic: Grossly normal Psych: Pleasant, normal  EKG: Sinus rhythm with PACs, incomplete left bundle  branch block at 63-personally reviewed  ASSESSMENT: 1. Coronary artery disease status post two-vessel CABG in 11/2012 (LIMA to LAD, SVG to circumflex). 2. NSTEMI - s/p PCI with DES to Ostial SVG-LAD stenosis (06/2016), 95% ostial left main stenosis status post 5.0 x 12 mm Onyx resolute DES (05/2018) 3. 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 Ischemic  cardiopathy EF 40-45% with inferior hypokinesis - improved to 60-65% (echo 12/2016) 5. Status post Bentall with mechanical aortic valve for aortic root aneurysm-11/2012 6. Status post pericardial window with hematoma evacuation 7. History of left subclavian stenosis status post bypass in 2001 8. Rheumatoid arthritis 9. Sarcoidosis 10. Dyslipidemia 11. Hypertension  PLAN: 1.   Mrs. Klosowski has been doing well finally without any recurrent chest pain or worsening shortness of breath.  Is now been about 2years since her last echo.  I like to repeat that.  Her last EF was 60 to 65% in 2018.  Her aortic mechanical valve appears to be functioning normally and has a normal valve click on exam today.  INR has been therapeutic.  Blood pressure is well controlled today.  Follow-up with me annually or sooner as necessary.  Chrystie Nose, MD, [facility], FACP  [place]  [facility]  Medical Director of the Advanced Lipid Disorders &  Cardiovascular Risk Reduction Clinic Diplomate of the American Board of Clinical Lipidology Attending Cardiologist  Direct Dial: [phone]  Fax: [phone]  Website:  [email or web address] 10/31/2019, 9:19 AM

## 2019-10-31 NOTE — Patient Instructions (Signed)
Medication Instructions:  Your physician recommends that you continue on your current medications as directed. Please refer to the Current Medication list given to you today.  *If you need a refill on your cardiac medications before your next appointment, please call your pharmacy*  Lab Work: NONE If you have labs (blood work) drawn today and your tests are completely normal, you will receive your results only by: Marland Kitchen MyChart Message (if you have MyChart) OR . A paper copy in the mail If you have any lab test that is abnormal or we need to change your treatment, we will call you to review the results.  Testing/Procedures: Echo @ [address] - 3rd Floor  Follow-Up: At [facility], you and your health needs are our priority.  As part of our continuing mission to provide you with exceptional heart care, we have created designated Provider Care Teams.  These Care Teams include your primary Cardiologist (physician) and Advanced Practice Providers (APPs -  Physician Assistants and Nurse Practitioners) who all work together to provide you with the care you need, when you need it.  Your next appointment:   12 month(s)  The format for your next appointment:   In Person  Provider:   You may see Chrystie Nose, MD or one of the following Advanced Practice Providers on your designated Care Team:    Azalee Course, PA-C  Micah Flesher, PA-C or   Judy Pimple, [place]   Other Instructions

## 2019-11-15 ENCOUNTER — Other Ambulatory Visit: Payer: Self-pay | Admitting: Internal Medicine

## 2019-11-21 ENCOUNTER — Other Ambulatory Visit: Payer: Self-pay | Admitting: Internal Medicine

## 2019-11-23 ENCOUNTER — Other Ambulatory Visit: Payer: Self-pay | Admitting: Internal Medicine

## 2019-11-24 ENCOUNTER — Other Ambulatory Visit: Payer: Self-pay | Admitting: Internal Medicine

## 2019-12-05 ENCOUNTER — Ambulatory Visit (HOSPITAL_COMMUNITY): Payer: Managed Care, Other (non HMO) | Attending: Internal Medicine

## 2019-12-05 ENCOUNTER — Other Ambulatory Visit: Payer: Self-pay

## 2019-12-05 DIAGNOSIS — I255 Ischemic cardiomyopathy: Secondary | ICD-10-CM | POA: Insufficient documentation

## 2019-12-05 DIAGNOSIS — Z952 Presence of prosthetic heart valve: Secondary | ICD-10-CM | POA: Insufficient documentation

## 2019-12-05 NOTE — Progress Notes (Signed)
Patient did not consent to the use of Definity at [facility] time due to concern with history of contrast allergy.

## 2019-12-12 ENCOUNTER — Ambulatory Visit (INDEPENDENT_AMBULATORY_CARE_PROVIDER_SITE_OTHER): Payer: Managed Care, Other (non HMO) | Admitting: Pharmacist

## 2019-12-12 ENCOUNTER — Other Ambulatory Visit: Payer: Self-pay

## 2019-12-12 DIAGNOSIS — Z7901 Long term (current) use of anticoagulants: Secondary | ICD-10-CM | POA: Diagnosis not present

## 2019-12-12 DIAGNOSIS — Z952 Presence of prosthetic heart valve: Secondary | ICD-10-CM | POA: Diagnosis not present

## 2019-12-12 LAB — POCT INR: INR: 1.4 — AB (ref 2.0–3.0)

## 2019-12-15 ENCOUNTER — Ambulatory Visit: Payer: Managed Care, Other (non HMO) | Attending: Internal Medicine

## 2019-12-15 DIAGNOSIS — Z23 Encounter for immunization: Secondary | ICD-10-CM

## 2019-12-15 NOTE — Progress Notes (Signed)
   Covid-19 Vaccination Clinic  Name:  [name]    MRN: [id number] DOB: 1951-08-06  12/15/2019  [name] was observed post Covid-19 immunization for 15 minutes without incident. She was provided with Vaccine Information Sheet and instruction to access the V-Safe system.   [name] was instructed to call 911 with any severe reactions post vaccine: Marland Kitchen Difficulty breathing  . Swelling of face and throat  . A fast heartbeat  . A bad rash all over body  . Dizziness and weakness   Immunizations Administered    Name Date Dose VIS Date Route   Pfizer COVID-19 Vaccine 12/15/2019 12:17 PM 0.3 mL 09/12/2019 Intramuscular   Manufacturer: [employer], [employer]   Lot: [id number]   NDC: [address]-4

## 2019-12-20 ENCOUNTER — Other Ambulatory Visit: Payer: Self-pay | Admitting: Internal Medicine

## 2019-12-22 ENCOUNTER — Other Ambulatory Visit: Payer: Self-pay | Admitting: *Deleted

## 2019-12-22 MED ORDER — CLOPIDOGREL BISULFATE 75 MG PO TABS: ORAL_TABLET | ORAL | 2 refills | Status: DC

## 2020-01-07 ENCOUNTER — Ambulatory Visit: Payer: Managed Care, Other (non HMO) | Attending: Internal Medicine

## 2020-01-07 DIAGNOSIS — Z23 Encounter for immunization: Secondary | ICD-10-CM

## 2020-01-07 NOTE — Progress Notes (Signed)
   Covid-19 Vaccination Clinic  Name:  Rani Idler    MRN: [id number] DOB: 1951-03-16  01/07/2020  Ms. Mccauley was observed post Covid-19 immunization for 15 minutes without incident. She was provided with Vaccine Information Sheet and instruction to access the V-Safe system.   Ms. Bresee was instructed to call 911 with any severe reactions post vaccine: Marland Kitchen Difficulty breathing  . Swelling of face and throat  . A fast heartbeat  . A bad rash all over body  . Dizziness and weakness   Immunizations Administered    Name Date Dose VIS Date Route   Pfizer COVID-19 Vaccine 01/07/2020  1:08 PM 0.3 mL 09/12/2019 Intramuscular   Manufacturer: [employer], [employer]   Lot: [id number]   NDC: [address]-9

## 2020-02-26 ENCOUNTER — Telehealth: Payer: Self-pay

## 2020-02-26 NOTE — Telephone Encounter (Signed)
LMOM FOR OVERDUE INR 

## 2020-03-02 ENCOUNTER — Telehealth: Payer: Self-pay

## 2020-03-02 NOTE — Telephone Encounter (Signed)
lmom for overdue inr 

## 2020-03-04 ENCOUNTER — Encounter: Payer: Self-pay | Admitting: Emergency Medicine

## 2020-03-04 ENCOUNTER — Other Ambulatory Visit: Payer: Self-pay

## 2020-03-04 ENCOUNTER — Emergency Department (INDEPENDENT_AMBULATORY_CARE_PROVIDER_SITE_OTHER)
Admission: EM | Admit: 2020-03-04 | Discharge: 2020-03-04 | Disposition: A | Discharge status: Home / Self Care | Payer: Managed Care, Other (non HMO) | Source: Home / Self Care

## 2020-03-04 ENCOUNTER — Emergency Department (INDEPENDENT_AMBULATORY_CARE_PROVIDER_SITE_OTHER): Payer: Managed Care, Other (non HMO)

## 2020-03-04 DIAGNOSIS — M25562 Pain in left knee: Secondary | ICD-10-CM

## 2020-03-04 DIAGNOSIS — M25462 Effusion, left knee: Secondary | ICD-10-CM | POA: Diagnosis not present

## 2020-03-04 DIAGNOSIS — W010XXA Fall on same level from slipping, tripping and stumbling without subsequent striking against object, initial encounter: Secondary | ICD-10-CM

## 2020-03-04 DIAGNOSIS — S8992XA Unspecified injury of left lower leg, initial encounter: Secondary | ICD-10-CM

## 2020-03-04 MED ORDER — ACETAMINOPHEN 325 MG PO TABS
975.0000 mg | ORAL_TABLET | Freq: Once | ORAL | Status: AC
  Administered 2020-03-04: 975 mg via ORAL

## 2020-03-04 MED ORDER — TRAMADOL HCL 50 MG PO TABS: 50.0000 mg | ORAL_TABLET | Freq: Four times a day (QID) | ORAL | 0 refills | Status: DC | PRN

## 2020-03-04 NOTE — Discharge Instructions (Signed)
  Tramadol is strong pain medication. While taking, do not drink alcohol, drive, or perform any other activities that requires focus while taking these medications.   You may also take acetaminophen 500mg  every 4-6 hours as needed for pain.  Call to schedule a follow up appointment with Sports Medicine in 1-2 weeks if not improving.

## 2020-03-04 NOTE — ED Triage Notes (Signed)
Lt knee injury from fall last night, tripped over a mop.

## 2020-03-04 NOTE — ED Provider Notes (Signed)
[facility]    CSN: [id number] Arrival date & time: 03/04/20  0808      History   Chief Complaint Chief Complaint  Patient presents with  . Fall    HPI [name] is a 69 y.o. female.   HPI [name] is a 69 y.o. female presenting to UC with c/o Left knee pain and swelling that started last night after trip and fall onto cement.  Pain is aching and sore, 9/10, worse with ambulation.  No pain medication taken PTA.  She has fallen on the same knee in the past but has never had surgery.  No other injuries from the fall.   Past Medical History:  Diagnosis Date  . Ascending aortic aneurysm (HCC)    a.  redo replacement of fusiform aneurysm of ascending aorta (complicated by pericardial hematoma s/p evacuation) in 2014.  Marland Kitchen Contrast media allergy   . Coronary artery disease    a. s/p CABG x2 in 2014. b. s/p DES to SVG-LAD in 2017.  . H/O mechanical aortic valve replacement   . Hyperlipidemia   . Hypertension   . Ischemic cardiomyopathy    a. EF 40-45% in 2017 at time of NSTEMI, improved to normal in 2018.  . Non-STEMI (non-ST elevated myocardial infarction) (HCC)   . Premature atrial contractions   . PVC's (premature ventricular contractions)   . RA (rheumatoid arthritis) (HCC)   . S/P AVR (aortic valve replacement) 08/1999   St. Jude AVR for aortic insuff, Goal INR 2.5-3.5  . 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 Donata Clay) - complicated by pericardial hematoma --> subxiphoid pericardial exposure and evacuation b. Cath 06/2016: DES to SVG-LAD ostium  . Sarcoid   . Second degree AV block    a. ? possibly seen in 12/2016 clinic visit - EKG not truly suggestive  . Severe anemia   . 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 s/p left subclavian bypass.    Patient Active Problem List   Diagnosis Date Noted  . Anemia 06/21/2018  . History of mechanical aortic valve  replacement   . 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 CAD in native artery 05/17/2018  . 2nd degree AV block 01/12/2017  . DOE (dyspnea on exertion) 01/12/2017  . Encounter for long-term (current) use of medications 01/12/2017  . Acute blood loss anemia 08/15/2016  . Black stools 08/15/2016  . Aortic valve disease 07/03/2016  . Cardiomyopathy, ischemic 07/03/2016  . Hx of CABG   . NSTEMI (non-ST elevated myocardial infarction) (HCC) 06/20/2016  . Acute pericardial effusion post op without tamponade - S/P subxiphoid pericaridal window 11/21/12 11/21/2012  . PVD , known Lt SCA stenosis 11/21/2012  . Pleural effusion, RT, post op 11/21/2012  . Unstable angina on admission 11/04/12 11/04/2012  . Coronary artery disease involving native coronary artery of native heart without angina pectoris 11/04/2012  . S/P AVR (aortic valve replacement) 11/04/2012  . Dilated ascending aorta at cath 11/03/12 - S/P root replacement 11/05/12 11/04/2012  . Chronic anticoagulation for mechanical AVR 11/04/2012  . Contrast media allergy 11/04/2012  . Essential hypertension 11/04/2012  . Sarcoidosis 11/04/2012    Past Surgical History:  Procedure Laterality Date  . AORTIC VALVE REPLACEMENT  08/23/1999   23mm St. Jude, [place] procedure, root replacemtn   . BYPASS GRAFT ANGIOGRAPHY  05/20/2018   Procedure: BYPASS GRAFT ANGIOGRAPHY;  Surgeon: Corky Crafts, MD;  Location: [place] [facility];  Service: Cardiovascular;;  . CARDIAC CATHETERIZATION  08/1999   normal L main, LAD & first diagonal are normal, normal Cfx, RCA with mild ostial narrowing, normal LV systolic function, severe AI, mild MR, normal PAP (Dr. Loni Muse. Little)   . CARDIAC CATHETERIZATION N/A 06/22/2016   Procedure: Left Heart Cath and Cors/Grafts Angiography;  Surgeon: Leonie Man, MD;  Location: [place] [facility];  Service: Cardiovascular;  Laterality: N/A;  . CARDIAC CATHETERIZATION N/A 06/23/2016   Procedure: Left Heart Cath and Coronary  Angiography;  Surgeon: Sherren Mocha, MD;  Location: [place] [facility];  Service: Cardiovascular;  Laterality: N/A;  . CARDIAC CATHETERIZATION N/A 06/23/2016   Procedure: Coronary Stent Intervention;  Surgeon: Sherren Mocha, MD;  Location: [place] [facility];  Service: Cardiovascular;  Laterality: N/A;  . CORONARY ARTERY BYPASS GRAFT  11/05/2012   Procedure: CORONARY ARTERY BYPASS GRAFTING (CABG);  Surgeon: Ivin Poot, MD;  Location: [place];  Service: Open Heart Surgery;  Laterality: N/A;  cannulate right subclavian  . CORONARY STENT INTERVENTION N/A 05/21/2018   Procedure: CORONARY STENT INTERVENTION;  Surgeon: Jettie Booze, MD;  Location: [place] [facility];  Service: Cardiovascular;  Laterality: N/A;  . ENDOVEIN HARVEST OF GREATER SAPHENOUS VEIN  11/05/2012   Procedure: ENDOVEIN HARVEST OF GREATER SAPHENOUS VEIN;  Surgeon: Ivin Poot, MD;  Location: [place];  Service: Open Heart Surgery;  Laterality: Right;  . 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 GROIN DEBRIDEMENT Left 06/11/2018   Procedure: EXPLORATION LEFT GROIN FOR BLEEDING;  Surgeon: Angelia Mould, MD;  Location: [place];  Service: Vascular;  Laterality: Left;  . IABP INSERTION N/A 05/21/2018   Procedure: IABP INSERTION;  Surgeon: Jettie Booze, MD;  Location: [place] [facility];  Service: Cardiovascular;  Laterality: N/A;  . INTRAOPERATIVE TRANSESOPHAGEAL ECHOCARDIOGRAM  11/05/2012   Procedure: INTRAOPERATIVE TRANSESOPHAGEAL ECHOCARDIOGRAM;  Surgeon: Ivin Poot, MD;  Location: [place];  Service: Open Heart Surgery;  Laterality: N/A;  . INTRAOPERATIVE TRANSESOPHAGEAL ECHOCARDIOGRAM N/A 11/21/2012   Procedure: INTRAOPERATIVE TRANSESOPHAGEAL ECHOCARDIOGRAM;  Surgeon: Ivin Poot, MD;  Location: [place];  Service: Open Heart Surgery;  Laterality: N/A;  .  LEFT HEART CATHETERIZATION WITH CORONARY ANGIOGRAM N/A 11/04/2012   Procedure: LEFT HEART CATHETERIZATION WITH CORONARY ANGIOGRAM;  Surgeon: Pixie Casino, MD;  Location: [facility];  Service: Cardiovascular;  Laterality: N/A;  . MEDIASTINOSCOPY  08/2005   bronchoscopy & mediastinoscopy for mediastinal adenopathy (Dr. Prescott Gum)   . NM MYOCAR PERF WALL MOTION  01/2012   lexiscan; small, fixed apical septal breast attenuation artifact & inferolateral bowel attenuation, no reversible ischemia, post-stress EF 64%, low risk scan   . PERICARDIAL WINDOW N/A 11/21/2012   Procedure: PERICARDIAL WINDOW;  Surgeon: Ivin Poot, MD;  Location: [place];  Service: Thoracic;  Laterality: N/A;  . REPLACEMENT ASCENDING AORTA  11/05/2012   Procedure: REPLACEMENT ASCENDING AORTA;  Surgeon: Ivin Poot, MD;  Location: [place];  Service: Open Heart Surgery;  Laterality: N/A;  . STERNAL WIRES REMOVAL N/A 05/26/2019   Procedure: STERNAL WIRE REMOVAL;  Surgeon: Ivin Poot, MD;  Location: [place];  Service: Thoracic;  Laterality: N/A;  . SUBCLAVIAN ANGIOGRAM  05/10/2000   left subclavian arteriogram; patent graft, mild stenosis (Dr. Delton See)   . SUBCLAVIAN BYPASS GRAFT  05/16/1999   left subclavian bypass with 88mm Darcon graft (Dr. Amada Kingfisher)  . TRANSTHORACIC ECHOCARDIOGRAM  11/20/2012   EF 65-70%, mild conc LVH, grade 2 diastolic dysfunction, mech AV, mild MR    OB History   No obstetric history on file.      Home Medications    Prior to Admission medications   Medication Sig Start Date End Date Taking? Authorizing Provider  acetaminophen (TYLENOL) 500 MG tablet Take 500 mg by mouth at bedtime as needed (pain).     [provider]  atorvastatin (LIPITOR) 80 MG tablet TAKE 1 TABLET (80 MG TOTAL) BY MOUTH DAILY AT 6 PM. 12/23/19   Hilty, Lisette Abu, MD  Calcium Carb-Cholecalciferol (CALCIUM 600+D3 PO) Take 1 tablet by mouth daily.     [provider]  clopidogrel (PLAVIX) 75 MG tablet TAKE 1  TABLET BY MOUTH EVERY DAY WITH BREAKFAST 12/22/19   Hilty, Lisette Abu, MD  isosorbide mononitrate (IMDUR) 30 MG 24 hr tablet Take 1 tablet (30 mg total) by mouth daily. 11/24/19   Hilty, Lisette Abu, MD  lisinopril (ZESTRIL) 2.5 MG tablet Take 1 tablet (2.5 mg total) by mouth daily. 11/24/19   Chrystie Nose, MD  Multiple Vitamin (MULTIVITAMIN WITH MINERALS) TABS tablet Take 1 tablet by mouth daily.    [provider]  nitroGLYCERIN (NITROSTAT) 0.4 MG SL tablet PLACE 1 TABLET (0.4 MG TOTAL) UNDER THE TONGUE EVERY 5 (FIVE) MINUTES AS NEEDED FOR CHEST PAIN. 02/13/18   Chrystie Nose, MD  pantoprazole (PROTONIX) 40 MG tablet TAKE 1 TABLET BY MOUTH EVERY DAY 11/21/19   Hilty, Lisette Abu, MD  polyvinyl alcohol (ARTIFICIAL TEARS) 1.4 % ophthalmic solution Place 1 drop into both eyes daily.     [provider]  traMADol (ULTRAM) 50 MG tablet Take 1 tablet (50 mg total) by mouth every 6 (six) hours as needed. 03/04/20   Lurene Shadow, PA-C  warfarin (COUMADIN) 5 MG tablet TAKE 1/2 TO 1 TABLET BY MOUTH DAILY OR AS DIRECTED BY COUMADIN CLINIC 11/17/19   Hilty, Lisette Abu, MD    Family History Family History  Problem Relation Age of Onset  . Hypertension Mother   . Colon cancer Neg Hx   . Esophageal cancer Neg Hx   . Pancreatic cancer Neg Hx   . Stomach cancer Neg Hx   . Liver disease Neg Hx     Social History Social History   Tobacco Use  . Smoking status: Never Smoker  . Smokeless tobacco: Never Used  Substance Use Topics  . Alcohol use: No  . Drug use: No     Allergies   Contrast media [iodinated diagnostic agents]   Review of Systems Review of Systems  Musculoskeletal: Positive for arthralgias, gait problem and joint swelling.  Skin: Negative for color change and wound.     Physical Exam Triage Vital Signs ED Triage Vitals  Enc Vitals Group     BP 03/04/20 0821 (!) 141/68     Pulse Rate 03/04/20 0821 84     Resp --      Temp 03/04/20 0821 99.5 F (37.5 C)      Temp Source 03/04/20 0821 Oral     SpO2 03/04/20 0821 99 %     Weight 03/04/20 0822 168 lb (76.2 kg)     Height 03/04/20 0822 5\' 6"  (1.676 m)     Head Circumference --      Peak Flow --      Pain Score  03/04/20 0822 9     Pain Loc --      Pain Edu? --      Excl. in GC? --    No data found.  Updated Vital Signs BP (!) 141/68 (BP Location: Right Arm)   Pulse 84   Temp 99.5 F (37.5 C) (Oral)   Ht 5\' 6"  (1.676 m)   Wt 168 lb (76.2 kg)   SpO2 99%   BMI 27.12 kg/m   Visual Acuity Right Eye Distance:   Left Eye Distance:   Bilateral Distance:    Right Eye Near:   Left Eye Near:    Bilateral Near:     Physical Exam Vitals and nursing note reviewed.  Constitutional:      Appearance: Normal appearance. She is well-developed.  HENT:     Head: Normocephalic and atraumatic.  Cardiovascular:     Rate and Rhythm: Normal rate.  Pulmonary:     Effort: Pulmonary effort is normal.  Musculoskeletal:        General: Swelling and tenderness present.     Cervical back: Normal range of motion.     Comments: Left knee: mild to moderate edema, diffuse tenderness. Limited knee flexion due to pain. Calf is soft, non-tender. No tenderness of hip or ankle.   Skin:    General: Skin is warm and dry.     Findings: No bruising or erythema.  Neurological:     Mental Status: She is alert and oriented to person, place, and time.  Psychiatric:        Behavior: Behavior normal.      UC Treatments / Results  Labs (all labs ordered are listed, but only abnormal results are displayed) Labs Reviewed - No data to display  EKG   Radiology DG Knee Complete 4 Views Left  Result Date: 03/04/2020 CLINICAL DATA:  Left knee pain and swelling since a fall last night. EXAM: LEFT KNEE - COMPLETE 4+ VIEW COMPARISON:  None. FINDINGS: There is no fracture or dislocation. There is a joint effusion. Tiny marginal osteophytes in the medial and patellofemoral compartments. IMPRESSION: No acute bone  abnormality. Joint effusion. Minimal degenerative changes. Electronically Signed   By: 05/04/2020 M.D.   On: 03/04/2020 09:11    Procedures Procedures (including critical care time)  Medications Ordered in UC Medications  acetaminophen (TYLENOL) tablet 975 mg (975 mg Oral Given 03/04/20 0848)    Initial Impression / Assessment and Plan / UC Course  I have reviewed the triage vital signs and the nursing notes.  Pertinent labs & imaging results that were available during my care of the patient were reviewed by me and considered in my medical decision making (see chart for details).     Discussed imaging with pt Knee brace and crutches provided F/u with Sports Medicine next week if not improving AVS provided  Final Clinical Impressions(s) / UC Diagnoses   Final diagnoses:  Left knee injury, initial encounter  Effusion of left knee  Acute pain of left knee     Discharge Instructions      Tramadol is strong pain medication. While taking, do not drink alcohol, drive, or perform any other activities that requires focus while taking these medications.   You may also take acetaminophen 500mg  every 4-6 hours as needed for pain.  Call to schedule a follow up appointment with Sports Medicine in 1-2 weeks if not improving.    ED Prescriptions    Medication Sig Dispense Auth. Provider   traMADol (  ULTRAM) 50 MG tablet Take 1 tablet (50 mg total) by mouth every 6 (six) hours as needed. 15 tablet Lurene Shadow, [place]     I have reviewed the PDMP during this encounter.   Lurene Shadow, [place] 03/04/20 [phone]

## 2020-03-29 ENCOUNTER — Telehealth: Payer: Self-pay

## 2020-03-29 NOTE — Telephone Encounter (Signed)
3rd lmom for inr to be scheduled discontinued episode of care as this is the last attempt to contact the pt

## 2020-03-30 NOTE — Telephone Encounter (Signed)
Patient returned call - left VM    Tried calling patient back, had to leave message asking her to call again

## 2020-03-30 NOTE — Telephone Encounter (Signed)
Coumadin clinic visit scheduled for tomorrow 6/30 at 4pm

## 2020-03-31 ENCOUNTER — Other Ambulatory Visit: Payer: Self-pay

## 2020-03-31 ENCOUNTER — Ambulatory Visit (INDEPENDENT_AMBULATORY_CARE_PROVIDER_SITE_OTHER): Payer: Managed Care, Other (non HMO)

## 2020-03-31 ENCOUNTER — Telehealth: Payer: Self-pay | Admitting: Internal Medicine

## 2020-03-31 DIAGNOSIS — Z7901 Long term (current) use of anticoagulants: Secondary | ICD-10-CM | POA: Diagnosis not present

## 2020-03-31 DIAGNOSIS — Z952 Presence of prosthetic heart valve: Secondary | ICD-10-CM

## 2020-03-31 LAB — POCT INR: INR: 1.8 — AB (ref 2.0–3.0)

## 2020-03-31 NOTE — Patient Instructions (Signed)
Take 1.5 tablets today and then take 1 tablet daily and 1/2 tablet on Monday.  Return in 2 weeks for INR check

## 2020-03-31 NOTE — Telephone Encounter (Signed)
Left message to call back  

## 2020-03-31 NOTE — Telephone Encounter (Signed)
   Terri Piedra NP form [employer], she said she saw pt today and to her findings pt's HR is very irregular with swelling of her left lower leg to her ankle and foot with some redness. She calling to let Dr. Rennis Golden know and check if pt needs to be seen. She said to call the pt to check  Please advise

## 2020-03-31 NOTE — Telephone Encounter (Signed)
Thanks .. we are happy to see her if she thinks it is necessary.  Dr Rexene Edison

## 2020-03-31 NOTE — Telephone Encounter (Signed)
Pt updated and state she feels fine and don't feel she need an appointment right now.

## 2020-03-31 NOTE — Telephone Encounter (Signed)
Received a message stating that Terri Piedra, NP from [employer] who was calling to report findings of irregular HR and swelling in patients left lower leg. Nurse attempted to contact NP but no number listed.   Nurse contacted pt who report she does not have a means of checking BP/HR and feels fine. She denies CP or SOB. Pt also report she fell a few days ago and was seen by urgent care. Pt report swelling and redness to left leg is a result from the fall and swelling is starting to decrease.   Will forward to MD to make aware.

## 2020-04-01 NOTE — Telephone Encounter (Signed)
Returned call to patient  She had INR check yesterday - level was low - med adjusted She informed Raquel of her ortho issues (as noted below) and was referred to [facility] walk-in clinic She went there yesterday and was told to contact PCP or Dr. Rennis Golden, states they did nothing for her She propped her legs up when she slept last night with minimal improvement in edema She states she feels fine, apart from swelling in LE Advised she contact PCP for thorough work-up and if cleared from ortho issues, may can try compression stockings

## 2020-04-01 NOTE — Telephone Encounter (Signed)
Patient calling to speak with Elease Hashimoto in regards to this conversation from yesterday.

## 2020-05-14 ENCOUNTER — Ambulatory Visit (INDEPENDENT_AMBULATORY_CARE_PROVIDER_SITE_OTHER): Payer: Managed Care, Other (non HMO)

## 2020-05-14 ENCOUNTER — Other Ambulatory Visit: Payer: Self-pay

## 2020-05-14 DIAGNOSIS — Z952 Presence of prosthetic heart valve: Secondary | ICD-10-CM | POA: Diagnosis not present

## 2020-05-14 DIAGNOSIS — Z5181 Encounter for therapeutic drug level monitoring: Secondary | ICD-10-CM | POA: Diagnosis not present

## 2020-05-14 DIAGNOSIS — Z7901 Long term (current) use of anticoagulants: Secondary | ICD-10-CM | POA: Diagnosis not present

## 2020-05-14 LAB — POCT INR: INR: 2 (ref 2.0–3.0)

## 2020-05-14 NOTE — Patient Instructions (Signed)
Continue taking 1 tablet daily and 1/2 tablet on Monday.  Return in 4 weeks for INR check

## 2020-05-21 ENCOUNTER — Other Ambulatory Visit: Payer: Self-pay | Admitting: Internal Medicine

## 2020-06-11 ENCOUNTER — Ambulatory Visit (INDEPENDENT_AMBULATORY_CARE_PROVIDER_SITE_OTHER): Payer: Managed Care, Other (non HMO)

## 2020-06-11 ENCOUNTER — Other Ambulatory Visit: Payer: Self-pay

## 2020-06-11 DIAGNOSIS — Z952 Presence of prosthetic heart valve: Secondary | ICD-10-CM | POA: Diagnosis not present

## 2020-06-11 DIAGNOSIS — Z5181 Encounter for therapeutic drug level monitoring: Secondary | ICD-10-CM

## 2020-06-11 DIAGNOSIS — Z7901 Long term (current) use of anticoagulants: Secondary | ICD-10-CM

## 2020-06-11 LAB — POCT INR: INR: 2.2 (ref 2.0–3.0)

## 2020-06-11 NOTE — Patient Instructions (Signed)
Continue taking 1 tablet daily and 1/2 tablet on Monday.  Return in 6 weeks for INR check

## 2020-06-21 ENCOUNTER — Other Ambulatory Visit: Payer: Self-pay | Admitting: Internal Medicine

## 2020-06-28 ENCOUNTER — Ambulatory Visit (HOSPITAL_COMMUNITY)
Admission: RE | Admit: 2020-06-28 | Discharge: 2020-06-28 | Disposition: A | Discharge status: Home / Self Care | Payer: Managed Care, Other (non HMO) | Source: Ambulatory Visit | Attending: Internal Medicine | Admitting: Internal Medicine

## 2020-06-28 ENCOUNTER — Other Ambulatory Visit: Payer: Self-pay

## 2020-06-28 DIAGNOSIS — I6523 Occlusion and stenosis of bilateral carotid arteries: Secondary | ICD-10-CM | POA: Insufficient documentation

## 2020-07-01 ENCOUNTER — Encounter: Payer: Self-pay | Admitting: Internal Medicine

## 2020-07-01 ENCOUNTER — Other Ambulatory Visit: Payer: Self-pay | Admitting: *Deleted

## 2020-07-01 DIAGNOSIS — I6523 Occlusion and stenosis of bilateral carotid arteries: Secondary | ICD-10-CM

## 2020-07-23 ENCOUNTER — Ambulatory Visit (INDEPENDENT_AMBULATORY_CARE_PROVIDER_SITE_OTHER): Payer: Managed Care, Other (non HMO) | Admitting: Pharmacist Clinician (PhC)/ Clinical Pharmacy Specialist

## 2020-07-23 ENCOUNTER — Other Ambulatory Visit: Payer: Self-pay

## 2020-07-23 DIAGNOSIS — Z7901 Long term (current) use of anticoagulants: Secondary | ICD-10-CM | POA: Diagnosis not present

## 2020-07-23 DIAGNOSIS — Z952 Presence of prosthetic heart valve: Secondary | ICD-10-CM

## 2020-07-23 LAB — POCT INR: INR: 1.8 — AB (ref 2.0–3.0)

## 2020-07-30 ENCOUNTER — Other Ambulatory Visit: Payer: Self-pay | Admitting: Internal Medicine

## 2020-07-30 ENCOUNTER — Other Ambulatory Visit: Payer: Self-pay | Admitting: Family Medicine

## 2020-07-30 DIAGNOSIS — Z1231 Encounter for screening mammogram for malignant neoplasm of breast: Secondary | ICD-10-CM

## 2020-09-09 ENCOUNTER — Telehealth: Payer: Self-pay

## 2020-09-09 NOTE — Telephone Encounter (Signed)
Called and lmomed pt for overdue inr 

## 2020-09-10 ENCOUNTER — Ambulatory Visit
Admission: RE | Admit: 2020-09-10 | Discharge: 2020-09-10 | Disposition: A | Discharge status: Home / Self Care | Payer: Managed Care, Other (non HMO) | Source: Ambulatory Visit | Attending: Internal Medicine | Admitting: Internal Medicine

## 2020-09-10 ENCOUNTER — Other Ambulatory Visit: Payer: Self-pay

## 2020-09-10 ENCOUNTER — Ambulatory Visit: Payer: Managed Care, Other (non HMO)

## 2020-09-10 DIAGNOSIS — Z1231 Encounter for screening mammogram for malignant neoplasm of breast: Secondary | ICD-10-CM

## 2020-09-22 ENCOUNTER — Telehealth: Payer: Self-pay

## 2020-09-22 NOTE — Telephone Encounter (Signed)
lmom for overdue inr 

## 2020-09-23 ENCOUNTER — Other Ambulatory Visit: Payer: Self-pay | Admitting: Internal Medicine

## 2020-09-29 ENCOUNTER — Ambulatory Visit (INDEPENDENT_AMBULATORY_CARE_PROVIDER_SITE_OTHER): Payer: Managed Care, Other (non HMO)

## 2020-09-29 ENCOUNTER — Other Ambulatory Visit: Payer: Self-pay

## 2020-09-29 DIAGNOSIS — Z7901 Long term (current) use of anticoagulants: Secondary | ICD-10-CM

## 2020-09-29 DIAGNOSIS — Z5181 Encounter for therapeutic drug level monitoring: Secondary | ICD-10-CM | POA: Diagnosis not present

## 2020-09-29 DIAGNOSIS — Z952 Presence of prosthetic heart valve: Secondary | ICD-10-CM

## 2020-09-29 LAB — POCT INR: INR: 2.1 (ref 2.0–3.0)

## 2020-09-29 NOTE — Patient Instructions (Signed)
continue taking 1 tablet daily and 1/2 tablet on Monday.  Return in 6 weeks for INR check

## 2020-11-10 ENCOUNTER — Telehealth: Payer: Self-pay | Admitting: Internal Medicine

## 2020-11-10 ENCOUNTER — Ambulatory Visit (INDEPENDENT_AMBULATORY_CARE_PROVIDER_SITE_OTHER): Payer: Managed Care, Other (non HMO)

## 2020-11-10 DIAGNOSIS — Z952 Presence of prosthetic heart valve: Secondary | ICD-10-CM | POA: Diagnosis not present

## 2020-11-10 DIAGNOSIS — Z7901 Long term (current) use of anticoagulants: Secondary | ICD-10-CM | POA: Diagnosis not present

## 2020-11-10 DIAGNOSIS — Z5181 Encounter for therapeutic drug level monitoring: Secondary | ICD-10-CM | POA: Diagnosis not present

## 2020-11-10 LAB — POCT INR: INR: 1.6 — AB (ref 2.0–3.0)

## 2020-11-10 NOTE — Telephone Encounter (Signed)
Patient is having recurrent nosebleeds. Urgent care at work will work on stopping nosebleed, but unable to draw STAT blood work.  Appointment with coumadin clinic given for today at 2pm. We will check INR and dose warfarin as needed.

## 2020-11-10 NOTE — Patient Instructions (Signed)
Take 2 tablets on Thursday only and then continue taking 1 tablet daily and 1/2 tablet on Monday.  Return in 4 weeks for INR check

## 2020-11-13 ENCOUNTER — Other Ambulatory Visit: Payer: Self-pay | Admitting: Internal Medicine

## 2020-12-14 ENCOUNTER — Emergency Department
Admission: EM | Admit: 2020-12-14 | Discharge: 2020-12-14 | Disposition: A | Discharge status: Home / Self Care | Payer: Managed Care, Other (non HMO) | Source: Home / Self Care | Attending: Family Medicine | Admitting: Family Medicine

## 2020-12-14 ENCOUNTER — Encounter: Payer: Self-pay | Admitting: Emergency Medicine

## 2020-12-14 ENCOUNTER — Emergency Department (INDEPENDENT_AMBULATORY_CARE_PROVIDER_SITE_OTHER): Payer: Managed Care, Other (non HMO)

## 2020-12-14 DIAGNOSIS — R0781 Pleurodynia: Secondary | ICD-10-CM

## 2020-12-14 DIAGNOSIS — S20212A Contusion of left front wall of thorax, initial encounter: Secondary | ICD-10-CM

## 2020-12-14 NOTE — ED Triage Notes (Signed)
Epigastric pain below rib cage on the left side  Started at 9:30am yesterday after eating steak, potatoes & eggs OTC tylenol last night - min relief Denies shoulder pain or back pain  No antacids COVID 2nd vaccine -April 2021 - no booster

## 2020-12-14 NOTE — ED Provider Notes (Signed)
[facility]    CSN: [id number] Arrival date & time: 12/14/20  1818      History   Chief Complaint Chief Complaint  Patient presents with  . Abdominal Pain    HPI [name] is a 70 y.o. female.   While at work yesterday about 9am, patient lifted/carried a large heavy box that bumped her left chest.  She subsequently developed pain in her left lower anterior chest.  She denies shortness of breath.  She notes that she had increased discomfort in the area after eating a large lunch, but denies nausea/vomiting.  The history is provided by the patient.    Past Medical History:  Diagnosis Date  . Ascending aortic aneurysm (HCC)    a.  redo replacement of fusiform aneurysm of ascending aorta (complicated by pericardial hematoma s/p evacuation) in 2014.  Marland Kitchen Contrast media allergy   . Coronary artery disease    a. s/p CABG x2 in 2014. b. s/p DES to SVG-LAD in 2017.  . H/O mechanical aortic valve replacement   . Hyperlipidemia   . Hypertension   . Ischemic cardiomyopathy    a. EF 40-45% in 2017 at time of NSTEMI, improved to normal in 2018.  . Non-STEMI (non-ST elevated myocardial infarction) (HCC)   . Premature atrial contractions   . PVC's (premature ventricular contractions)   . RA (rheumatoid arthritis) (HCC)   . S/P AVR (aortic valve replacement) 08/1999   St. Jude AVR for aortic insuff, Goal INR 2.5-3.5  . 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 Donata Clay) - complicated by pericardial hematoma --> subxiphoid pericardial exposure and evacuation b. Cath 06/2016: DES to SVG-LAD ostium  . Sarcoid   . Second degree AV block    a. ? possibly seen in 12/2016 clinic visit - EKG not truly suggestive  . Severe anemia   . 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 s/p left subclavian bypass.    Patient Active Problem List   Diagnosis Date Noted  . Anemia 06/21/2018  . History of  mechanical aortic valve replacement   . 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 CAD in native artery 05/17/2018  . 2nd degree AV block 01/12/2017  . DOE (dyspnea on exertion) 01/12/2017  . Encounter for long-term (current) use of medications 01/12/2017  . Acute blood loss anemia 08/15/2016  . Black stools 08/15/2016  . Aortic valve disease 07/03/2016  . Cardiomyopathy, ischemic 07/03/2016  . Hx of CABG   . NSTEMI (non-ST elevated myocardial infarction) (HCC) 06/20/2016  . Acute pericardial effusion post op without tamponade - S/P subxiphoid pericaridal window 11/21/12 11/21/2012  . PVD , known Lt SCA stenosis 11/21/2012  . Pleural effusion, RT, post op 11/21/2012  . Unstable angina on admission 11/04/12 11/04/2012  . Coronary artery disease involving native coronary artery of native heart without angina pectoris 11/04/2012  . S/P AVR (aortic valve replacement) 11/04/2012  . Dilated ascending aorta at cath 11/03/12 - S/P root replacement 11/05/12 11/04/2012  . Chronic anticoagulation for mechanical AVR 11/04/2012  . Contrast media allergy 11/04/2012  . Essential hypertension 11/04/2012  . Sarcoidosis 11/04/2012    Past Surgical History:  Procedure Laterality Date  . AORTIC VALVE REPLACEMENT  08/23/1999   92mm St. Jude, [place] procedure, root replacemtn   . BYPASS GRAFT ANGIOGRAPHY  05/20/2018   Procedure: BYPASS GRAFT ANGIOGRAPHY;  Surgeon: Corky Crafts, MD;  Location: [facility] INVASIVE  CV LAB;  Service: Cardiovascular;;  . CARDIAC CATHETERIZATION  08/1999   normal L main, LAD & first diagonal are normal, normal Cfx, RCA with mild ostial narrowing, normal LV systolic function, severe AI, mild MR, normal PAP (Dr. Mervyn Skeeters. Little)   . CARDIAC CATHETERIZATION N/A 06/22/2016   Procedure: Left Heart Cath and Cors/Grafts Angiography;  Surgeon: Marykay Lex, MD;  Location: [facility] INVASIVE CV LAB;  Service: Cardiovascular;  Laterality: N/A;  . CARDIAC CATHETERIZATION N/A 06/23/2016   Procedure: Left Heart  Cath and Coronary Angiography;  Surgeon: Tonny Bollman, MD;  Location: [facility] INVASIVE CV LAB;  Service: Cardiovascular;  Laterality: N/A;  . CARDIAC CATHETERIZATION N/A 06/23/2016   Procedure: Coronary Stent Intervention;  Surgeon: Tonny Bollman, MD;  Location: [facility] INVASIVE CV LAB;  Service: Cardiovascular;  Laterality: N/A;  . CORONARY ARTERY BYPASS GRAFT  11/05/2012   Procedure: CORONARY ARTERY BYPASS GRAFTING (CABG);  Surgeon: Kerin Perna, MD;  Location: [facility] OR;  Service: Open Heart Surgery;  Laterality: N/A;  cannulate right subclavian  . CORONARY STENT INTERVENTION N/A 05/21/2018   Procedure: CORONARY STENT INTERVENTION;  Surgeon: Corky Crafts, MD;  Location: [facility] INVASIVE CV LAB;  Service: Cardiovascular;  Laterality: N/A;  . ENDOVEIN HARVEST OF GREATER SAPHENOUS VEIN  11/05/2012   Procedure: ENDOVEIN HARVEST OF GREATER SAPHENOUS VEIN;  Surgeon: Kerin Perna, MD;  Location: [facility] OR;  Service: Open Heart Surgery;  Laterality: Right;  . 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 GROIN DEBRIDEMENT Left 06/11/2018   Procedure: EXPLORATION LEFT GROIN FOR BLEEDING;  Surgeon: Chuck Hint, MD;  Location: [facility] OR;  Service: Vascular;  Laterality: Left;  . IABP INSERTION N/A 05/21/2018   Procedure: IABP INSERTION;  Surgeon: Corky Crafts, MD;  Location: [facility] INVASIVE CV LAB;  Service: Cardiovascular;  Laterality: N/A;  . INTRAOPERATIVE TRANSESOPHAGEAL ECHOCARDIOGRAM  11/05/2012   Procedure: INTRAOPERATIVE TRANSESOPHAGEAL ECHOCARDIOGRAM;  Surgeon: Kerin Perna, MD;  Location: [facility] OR;  Service: Open Heart Surgery;  Laterality: N/A;  . INTRAOPERATIVE TRANSESOPHAGEAL ECHOCARDIOGRAM N/A 11/21/2012   Procedure: INTRAOPERATIVE TRANSESOPHAGEAL ECHOCARDIOGRAM;  Surgeon: Kerin Perna, MD;  Location: [facility] OR;  Service: Open Heart Surgery;   Laterality: N/A;  . LEFT HEART CATHETERIZATION WITH CORONARY ANGIOGRAM N/A 11/04/2012   Procedure: LEFT HEART CATHETERIZATION WITH CORONARY ANGIOGRAM;  Surgeon: Chrystie Nose, MD;  Location: [facility] CATH LAB;  Service: Cardiovascular;  Laterality: N/A;  . MEDIASTINOSCOPY  08/2005   bronchoscopy & mediastinoscopy for mediastinal adenopathy (Dr. Donata Clay)   . NM MYOCAR PERF WALL MOTION  01/2012   lexiscan; small, fixed apical septal breast attenuation artifact & inferolateral bowel attenuation, no reversible ischemia, post-stress EF 64%, low risk scan   . PERICARDIAL WINDOW N/A 11/21/2012   Procedure: PERICARDIAL WINDOW;  Surgeon: Kerin Perna, MD;  Location: [facility] OR;  Service: Thoracic;  Laterality: N/A;  . REPLACEMENT ASCENDING AORTA  11/05/2012   Procedure: REPLACEMENT ASCENDING AORTA;  Surgeon: Kerin Perna, MD;  Location: [facility] OR;  Service: Open Heart Surgery;  Laterality: N/A;  . STERNAL WIRES REMOVAL N/A 05/26/2019   Procedure: STERNAL WIRE REMOVAL;  Surgeon: Kerin Perna, MD;  Location: [facility] OR;  Service: Thoracic;  Laterality: N/A;  . SUBCLAVIAN ANGIOGRAM  05/10/2000   left subclavian arteriogram; patent graft, mild stenosis (Dr. Amada Kingfisher)   . SUBCLAVIAN BYPASS GRAFT  05/16/1999  left subclavian bypass with 85mm Darcon graft (Dr. Amada Kingfisher)  . TRANSTHORACIC ECHOCARDIOGRAM  11/20/2012   EF 65-70%, mild conc LVH, grade 2 diastolic dysfunction, mech AV, mild MR    OB History   No obstetric history on file.      Home Medications    Prior to Admission medications   Medication Sig Start Date End Date Taking? Authorizing Provider  acetaminophen (TYLENOL) 500 MG tablet Take 500 mg by mouth at bedtime as needed (pain).    Yes [provider]  atorvastatin (LIPITOR) 80 MG tablet TAKE 1 TABLET (80 MG TOTAL) BY MOUTH DAILY AT 6 PM. 06/21/20  Yes Hilty, Lisette Abu, MD  Calcium Carb-Cholecalciferol (CALCIUM 600+D3 PO) Take 1 tablet by mouth daily.    Yes [provider]  clopidogrel  (PLAVIX) 75 MG tablet TAKE 1 TABLET BY MOUTH EVERY DAY WITH BREAKFAST 09/23/20  Yes Hilty, Lisette Abu, MD  furosemide (LASIX) 40 MG tablet Take 40 mg by mouth daily. 11/14/20  Yes [provider]  isosorbide mononitrate (IMDUR) 30 MG 24 hr tablet TAKE 1 TABLET BY MOUTH EVERY DAY 05/21/20  Yes Hilty, Lisette Abu, MD  lisinopril (ZESTRIL) 2.5 MG tablet TAKE 1 TABLET BY MOUTH EVERY DAY 11/15/20  Yes Hilty, Lisette Abu, MD  Multiple Vitamin (MULTIVITAMIN WITH MINERALS) TABS tablet Take 1 tablet by mouth daily.   Yes [provider]  pantoprazole (PROTONIX) 40 MG tablet TAKE 1 TABLET BY MOUTH EVERY DAY 11/15/20  Yes Hilty, Lisette Abu, MD  potassium chloride (KLOR-CON) 10 MEQ tablet Take 10 mEq by mouth daily. 11/14/20  Yes [provider]  warfarin (COUMADIN) 5 MG tablet TAKE 1/2 TO 1 TABLET BY MOUTH DAILY OR AS DIRECTED BY COUMADIN CLINIC 11/15/20  Yes Hilty, Lisette Abu, MD  nitroGLYCERIN (NITROSTAT) 0.4 MG SL tablet PLACE 1 TABLET (0.4 MG TOTAL) UNDER THE TONGUE EVERY 5 (FIVE) MINUTES AS NEEDED FOR CHEST PAIN. 02/13/18   Hilty, Lisette Abu, MD  polyvinyl alcohol (ARTIFICIAL TEARS) 1.4 % ophthalmic solution Place 1 drop into both eyes daily.     [provider]  traMADol (ULTRAM) 50 MG tablet Take 1 tablet (50 mg total) by mouth every 6 (six) hours as needed. Patient not taking: Reported on 12/14/2020 03/04/20   Rolla Plate    Family History Family History  Problem Relation Age of Onset  . Hypertension Mother   . Colon cancer Neg Hx   . Esophageal cancer Neg Hx   . Pancreatic cancer Neg Hx   . Stomach cancer Neg Hx   . Liver disease Neg Hx     Social History Social History   Tobacco Use  . Smoking status: Never Smoker  . Smokeless tobacco: Never Used  Vaping Use  . Vaping Use: Never used  Substance Use Topics  . Alcohol use: No  . Drug use: No     Allergies   Contrast media [iodinated diagnostic agents]   Review of Systems Review of Systems   Constitutional: Negative for activity change, appetite change, chills, diaphoresis, fatigue and fever.  HENT: Negative.   Eyes: Negative.   Respiratory: Positive for chest tightness. Negative for cough, shortness of breath, wheezing and stridor.   Cardiovascular: Positive for chest pain. Negative for palpitations and leg swelling.  Gastrointestinal: Negative.   Genitourinary: Negative.   Musculoskeletal: Negative.   Skin: Negative.   Neurological: Negative for headaches.     Physical Exam Triage Vital Signs ED Triage Vitals  Enc Vitals Group  BP 12/14/20 1837 (!) 149/72     Pulse Rate 12/14/20 1837 70     Resp 12/14/20 1837 17     Temp 12/14/20 1837 98.4 F (36.9 C)     Temp Source 12/14/20 1837 Oral     SpO2 12/14/20 1837 100 %     Weight 12/14/20 1841 180 lb (81.6 kg)     Height 12/14/20 1841  (1.676 m)     Head Circumference --      Peak Flow --      Pain Score 12/14/20 1840 2     Pain Loc --      Pain Edu? --      Excl. in GC? --    No data found.  Updated Vital Signs BP (!) 149/72 (BP Location: Right Arm)   Pulse 70   Temp 98.4 F (36.9 C) (Oral)   Resp 17   Ht  (1.676 m)   Wt 81.6 kg   SpO2 100%   BMI 29.05 kg/m   Visual Acuity Right Eye Distance:   Left Eye Distance:   Bilateral Distance:    Right Eye Near:   Left Eye Near:    Bilateral Near:     Physical Exam Vitals and nursing note reviewed.  Constitutional:      General: She is not in acute distress. HENT:     Head: Atraumatic.     Right Ear: External ear normal.     Left Ear: External ear normal.     Mouth/Throat:     Mouth: Mucous membranes are moist.  Eyes:     Conjunctiva/sclera: Conjunctivae normal.     Pupils: Pupils are equal, round, and reactive to light.  Cardiovascular:     Rate and Rhythm: Normal rate.     Heart sounds: Normal heart sounds.  Pulmonary:     Breath sounds: Normal breath sounds.  Chest:     Chest wall: Tenderness present.       Comments:  There is tenderness to palpation along the left anterior costal margin  as noted on diagram.  No swelling, crepitance, or ecchymosis present. Abdominal:     Palpations: Abdomen is soft.     Tenderness: There is no abdominal tenderness.  Musculoskeletal:     Cervical back: Normal range of motion.     Right lower leg: No edema.     Left lower leg: No edema.  Skin:    General: Skin is warm and dry.     Findings: No bruising or erythema.  Neurological:     Mental Status: She is alert.      UC Treatments / Results  Labs (all labs ordered are listed, but only abnormal results are displayed) Labs Reviewed - No data to display  EKG  Rate:  66 BPM PR:  152 msec QT:  420 msec QTcH:  440 msec QRSD:  108 msec QRS axis:  58 degrees Interpretation:   Sinus rhythm with premature supraventricular complexes.  LVH with repolarization abnormality. Review of past records reveals no significant changes from EKG done 31 Oct 2019  Radiology DG Ribs Unilateral W/Chest Left  Result Date: 12/14/2020 CLINICAL DATA:  LEFT anterior lower rib pain after lifting a heavy box yesterday EXAM: LEFT RIBS AND CHEST - 3+ VIEW COMPARISON:  Chest radiograph 05/21/2019 FINDINGS: Upper normal heart size post CABG. Extensive atherosclerotic calcification of a tortuous thoracic aorta. Pulmonary vascularity normal. Chronic elevation of LEFT diaphragm. No acute infiltrate, pleural effusion, or pneumothorax. Mild  osseous demineralization. BB placed at site of symptoms lower LEFT ribs. Bifid anterior LEFT eighth rib. No rib fracture or bone destruction identified. IMPRESSION: No acute abnormalities. Electronically Signed   By: Ulyses Southward M.D.   On: 12/14/2020 20:01    Procedures Procedures (including critical care time)  Medications Ordered in UC Medications - No data to display  Initial Impression / Assessment and Plan / UC Course  I have reviewed the triage vital signs and the nursing notes.  Pertinent labs &  imaging results that were available during my care of the patient were reviewed by me and considered in my medical decision making (see chart for details).    Patient has multiple cardiovascular risk factors, but present complaint appears to be of musculoskeletal etiology. EKG and chest/left rib x-rays show no acute findings.  Treat symptomatically for now.  Followup with Family Doctor if not improved in one week.   Final Clinical Impressions(s) / UC Diagnoses   Final diagnoses:  Contusion of ribs, left, initial encounter     Discharge Instructions     Apply ice pack for 20 to 30 minutes, 3 to 4 times daily  Continue until pain and swelling decrease.  May take Tylenol as needed for pain.  If symptoms become significantly worse during the night or over the weekend, proceed to the local emergency room.     ED Prescriptions    None        Lattie Haw, MD 12/15/20 [phone]

## 2020-12-14 NOTE — Discharge Instructions (Addendum)
Apply ice pack for 20 to 30 minutes, 3 to 4 times daily  Continue until pain and swelling decrease.  May take Tylenol as needed for pain. ° °If symptoms become significantly worse during the night or over the weekend, proceed to the local emergency room.  °

## 2020-12-17 ENCOUNTER — Other Ambulatory Visit: Payer: Self-pay

## 2020-12-17 ENCOUNTER — Ambulatory Visit (INDEPENDENT_AMBULATORY_CARE_PROVIDER_SITE_OTHER): Payer: Managed Care, Other (non HMO)

## 2020-12-17 ENCOUNTER — Other Ambulatory Visit: Payer: Self-pay | Admitting: Internal Medicine

## 2020-12-17 DIAGNOSIS — Z5181 Encounter for therapeutic drug level monitoring: Secondary | ICD-10-CM

## 2020-12-17 DIAGNOSIS — Z952 Presence of prosthetic heart valve: Secondary | ICD-10-CM | POA: Diagnosis not present

## 2020-12-17 DIAGNOSIS — Z7901 Long term (current) use of anticoagulants: Secondary | ICD-10-CM | POA: Diagnosis not present

## 2020-12-17 LAB — POCT INR: INR: 3.3 — AB (ref 2.0–3.0)

## 2020-12-17 NOTE — Patient Instructions (Signed)
Hold tonight only and then  continue taking 1 tablet daily, except 1/2 tablet on Monday.  Return in 4 weeks for INR check

## 2020-12-20 ENCOUNTER — Ambulatory Visit: Payer: Managed Care, Other (non HMO) | Admitting: Internal Medicine

## 2020-12-20 ENCOUNTER — Other Ambulatory Visit: Payer: Self-pay

## 2020-12-20 ENCOUNTER — Encounter: Payer: Self-pay | Admitting: Internal Medicine

## 2020-12-20 VITALS — BP 160/72 | HR 72 | Ht 66.0 in | Wt 183.8 lb

## 2020-12-20 DIAGNOSIS — Z952 Presence of prosthetic heart valve: Secondary | ICD-10-CM

## 2020-12-20 DIAGNOSIS — F22 Delusional disorders: Secondary | ICD-10-CM | POA: Diagnosis not present

## 2020-12-20 DIAGNOSIS — I255 Ischemic cardiomyopathy: Secondary | ICD-10-CM

## 2020-12-20 DIAGNOSIS — Z7901 Long term (current) use of anticoagulants: Secondary | ICD-10-CM

## 2020-12-20 DIAGNOSIS — I10 Essential (primary) hypertension: Secondary | ICD-10-CM

## 2020-12-20 NOTE — Patient Instructions (Addendum)
Medication Instructions:  Your physician recommends that you continue on your current medications as directed. Please refer to the Current Medication list given to you today.  *If you need a refill on your cardiac medications before your next appointment, please call your pharmacy*   Follow-Up: At [facility], you and your health needs are our priority.  As part of our continuing mission to provide you with exceptional heart care, we have created designated Provider Care Teams.  These Care Teams include your primary Cardiologist (physician) and Advanced Practice Providers (APPs -  Physician Assistants and Nurse Practitioners) who all work together to provide you with the care you need, when you need it.  We recommend signing up for the patient portal called "MyChart".  Sign up information is provided on this After Visit Summary.  MyChart is used to connect with patients for Virtual Visits (Telemedicine).  Patients are able to view lab/test results, encounter notes, upcoming appointments, etc.  Non-urgent messages can be sent to your provider as well.   To learn more about what you can do with MyChart, go to [email or web address].    Your next appointment:   12 month(s)  The format for your next appointment:   In Person  Provider:   You may see Chrystie Nose, MD or one of the following Advanced Practice Providers on your designated Care Team:    Azalee Course, PA-C  Micah Flesher, [place] or   Judy Pimple, [place]    Other Instructions  You have been referred to [employer]  -- call your insurance company and see who is in network/what if anything your copay may be

## 2020-12-20 NOTE — Progress Notes (Signed)
OFFICE NOTE  Chief Complaint:  Follow-up  Primary Care Physician: Cleatis Polka., MD  HPI:  [name] is a very pleasant 70 year old African American female with a history of St. Jude AVR placed in 2000 for aortic insufficiency. 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 She also has a history of sarcoidosis, dyslipidemia and contrast allergy, which apparently gave her hives. In April 2013, she saw Dr. Clarene Duke with complaints of chest pain, for which he then did a Myoview on her, which was normal. She was last seen by Corine Shelter on October 21, 2012. At that time she was complaining of having chest pressure and shortness of breath. She was set up for a diagnostic heart catheterization. This was completed by Dr. Rennis Golden, which revealed severe ostial left main stenosis greater than 95%. There was also suggestion of possible ostial RCA disease. The ascending aorta was dilated up to 4.8 cm. At that time, Dr. Donata Clay was asked to consult. Patient was then taken for bypass surgery x2 with a saphenous vein graft to the left anterior descending coronary artery and a saphenous vein graft to the circumflex marginal. 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 Also during that hospitalization, she had development of a pericardial hematoma. Dr. Donata Clay then did a subxiphoid pericardial exposure and evacuation of the pericardial hematoma. She was seen in follow-up in March 2014 and was doing well.  She had lost about 17 lbs during her hospitalization.  She reports returning to work this past summer and is still able to do most activities. Her INR has been followed in our Coumadin clinic and is actually supratherapeutic today at 4. Her dose will be adjusted. She's not had any further laboratory work or followup since this past Spring.  She denies any chest pain with exertion, but does report some discomfort associated with her  anterior scars as well as her sternal wires. She does occasionally get fatigued with work but this improves with rest.  I saw [name] back today in follow-up. She is doing fairly well. She denies any shortness of breath or chest pain. She is annoyed somewhat at her mechanical valve which she says is very loud. She underwent a CT scan this year in March which shows a stable thoracic aneurysm repair at 4.0 cm. 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 In the past she's had left subclavian bypass and will need ultrasound follow-up of that. She's also not had any lab work in the past several months.  [name] returns today for follow-up. Overall she seems to be doing pretty well. She says she gets hot and noted that her blood pressure is somewhat elevated from time to time now. It seems as if she's not currently on any blood pressure medicines. Last time I saw her she was not taking lisinopril and her blood pressure was fairly normal therefore I kept her off the medicine. Previous to that she was taken off of her beta blocker. She really has indication for either one of these are both based on her history of coronary disease, coronary artery bypass grafting, valve repair, and aneurysm. Blood pressure is elevated today. She is due for an INR check. Dopplers including AAA screen as well as ultrasound of her subclavian stenosis were performed in October last year and are stable.  07/03/2016  [name] returns today for  follow-up. Unfortunately she recently had some unstable angina and presented to the hospital with chest pain. She underwent cardiac catheterization after being found to have a NSTEMI with elevated troponin of 0.26. Warfarin was held and she was found to have an EF of 40-45% with moderate hypokinesis of the basal inferior myocardium and grade 2 diastolic dysfunction. Her mechanical heart valve was functioning normally. She then underwent cardiac  catheterization which demonstrated a patent SVG to LAD and SVG to OM1. There was 95% stenosis of an ostial left main, 90% ostial first marginal, her percent ostial RCA and 90% ostial LAD disease. Given the difficulty of the case, intervention was deferred until the next day when Dr. Excell Seltzer performed PCI to the ostium of the SVG to LAD. Selective angiography was not possible after the case due to the stent strut hanging out into the aorta however aortic root angiography showed normal flow down the graft and the patient was noted to be chest pain-free. He recommended aspirin, Plavix and warfarin for 3 months, then discontinuing aspirin and continuing warfarin and Plavix. Today she reports no further chest pain. She initially talked to me about disability however after more discussion she understood that if she were to become formally disabled she could never work again and she did want to continue to work part time in addition to having disability. Based on that she wishes to go back to work and she should be able to go back to work as early as October 9.  01/12/2017  [name] returns today for follow-up. She says she feels a bit stronger and does not feel palpitations is bad at night. EKG today however shows sinus rhythm with a second-degree AV block with a rate of 69. Blood pressures accordingly low 96/50. She is on low-dose metoprolol. She recently stopped aspirin is on Plavix and warfarin. She is due for an INR check today. She had a repeat echocardiogram which shows a normally functioning mechanical aortic valve as well as an improvement in EF up to 60-65%. She does report a little bit of fatigue particularly when walking around [place]. She is not significantly active and inoperative spitting cardigan rehabilitation due to her job. I encouraged her to start to do more physical activity as I do not see any cardiac limitation to her symptoms.   08/03/2017  [name] returns today for follow-up.  Overall she  is feeling well.  She occasionally gets some bruising.  She is currently on Plavix and warfarin.  Her INR was recently assessed.  She had a GI bleeding last fall but this is not recurred.  Her LVEF improved to 60-65% as of April 2018.  She denies any recurrent chest pain.  Her valve has been stable.  We did receive lab work from her PCP this summer which showed a well-controlled lipid profile as well as stable hemoglobin of 13.5.  She tells me that she plans to retire next year.  02/22/2018  [name] was seen today in follow-up.  She continues to do well.  She denies chest pain or worsening shortness of breath.  Her INR today was 2.4 which is slightly low however reasonably well controlled.  She denies any GI bleeding.  LVEF had normalized.  She was planning to retire however it says she is still working.  Blood pressure is well controlled today.  EKG shows sinus rhythm with PVCs and PACs.  She is not symptomatic with this.    11/08/2018  [name] is seen today in  follow-up.  Unfortunately she has had recent significant hospitalizations.  I saw her in the hospital in August 2019, at which time she presented with chest pain and elevated troponin suggestive of non-STEMI.  She was anticoagulated on warfarin and I recommended admission and heart catheterization however she said that she had some things to take care of and left AGAINST MEDICAL ADVICE.  She then came back to the hospital as promised the next day for direct admission and further cardiac work-up. She did have heart catheterization which showed possible severe in-stent restenosis.  Ultimately she underwent balloon pump supported staged PCI to an ostial left main stenosis which was 95%.  She received a 5.0 x 12 mm Onyx resolute stent.  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  Then it required multiple operations due to recurrent bleeding.  She was admitted several times to the hospital and  then followed up in the office at which time several medication changes were made including taking her off of blood pressure medications.  This was due to dizziness.  [name] says that she does still have some dizziness despite coming off her medicines however blood pressure is higher than it had been previously.  Again she had a history of heart failure in the past however EF had normalized.  I am concerned about redevelopment of cardiomyopathy.  Today she is asymptomatic.  She says she has good energy level.  She has not fatigue.  She denies any chest pains.  INR today was therapeutic.  10/31/2019  [name] returns today for follow-up.  Finally she seems to be doing well.  She is asymptomatic and denies chest pain or worsening shortness of breath.  Is now been 2 years since her last echo but she had a normally functioning mechanical valve.  Her INR is are stable.  She is excited that she will be moving to a townhouse next week.  12/20/2020  [name] is seen today for annual follow-up.  She denies any chest pain or worsening shortness of breath.  Her last echo showed stable valve gradient and normally functioning mechanical aortic valve.  Her warfarin levels have been stable.  Blood pressure is high today.  She says that she was upset about ongoing issues in her house.  She had told me the last time I saw her that she was forced to leave her house because of some people that were bugging her and getting into her house.  She says now that they followed her to her new house.  They have a master key and are able to get in her house and come by daily.  They are apparently spying on her and messing with her electrical outlets.  She says she spends hours every day trying to cover her electrical outlets so that they cannot get to her.  She also says that she hears voices throughout the day.  She does live alone.  She still works.  She says that her son think she is paranoid but she denies that.  He is  apparently a history of mental health disorder in her mother.  PMHx:  Past Medical History:  Diagnosis Date  . Ascending aortic aneurysm (HCC)    a.  redo replacement of fusiform aneurysm of ascending aorta (complicated by pericardial hematoma s/p evacuation) in 2014.  Marland Kitchen Contrast media allergy   . Coronary artery disease    a. s/p CABG x2 in 2014. b. s/p DES to SVG-LAD in  2017.  . H/O mechanical aortic valve replacement   . Hyperlipidemia   . Hypertension   . Ischemic cardiomyopathy    a. EF 40-45% in 2017 at time of NSTEMI, improved to normal in 2018.  . Non-STEMI (non-ST elevated myocardial infarction) (HCC)   . Premature atrial contractions   . PVC's (premature ventricular contractions)   . RA (rheumatoid arthritis) (HCC)   . S/P AVR (aortic valve replacement) 08/1999   St. Jude AVR for aortic insuff, Goal INR 2.5-3.5  . 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 Donata Clay) - complicated by pericardial hematoma --> subxiphoid pericardial exposure and evacuation b. Cath 06/2016: DES to SVG-LAD ostium  . Sarcoid   . Second degree AV block    a. ? possibly seen in 12/2016 clinic visit - EKG not truly suggestive  . Severe anemia   . 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 s/p left subclavian bypass.    Past Surgical History:  Procedure Laterality Date  . AORTIC VALVE REPLACEMENT  08/23/1999   38mm St. Jude, Bentall procedure, root replacemtn   . BYPASS GRAFT ANGIOGRAPHY  05/20/2018   Procedure: BYPASS GRAFT ANGIOGRAPHY;  Surgeon: Corky Crafts, MD;  Location: [facility] INVASIVE CV LAB;  Service: Cardiovascular;;  . CARDIAC CATHETERIZATION  08/1999   normal L main, LAD & first diagonal are normal, normal Cfx, RCA with mild ostial narrowing, normal LV systolic function, severe AI, mild MR, normal PAP (Dr. Mervyn Skeeters. Little)   . CARDIAC CATHETERIZATION N/A 06/22/2016   Procedure: Left Heart Cath and  Cors/Grafts Angiography;  Surgeon: Marykay Lex, MD;  Location: [facility] INVASIVE CV LAB;  Service: Cardiovascular;  Laterality: N/A;  . CARDIAC CATHETERIZATION N/A 06/23/2016   Procedure: Left Heart Cath and Coronary Angiography;  Surgeon: Tonny Bollman, MD;  Location: [facility] INVASIVE CV LAB;  Service: Cardiovascular;  Laterality: N/A;  . CARDIAC CATHETERIZATION N/A 06/23/2016   Procedure: Coronary Stent Intervention;  Surgeon: Tonny Bollman, MD;  Location: [facility] INVASIVE CV LAB;  Service: Cardiovascular;  Laterality: N/A;  . CORONARY ARTERY BYPASS GRAFT  11/05/2012   Procedure: CORONARY ARTERY BYPASS GRAFTING (CABG);  Surgeon: Kerin Perna, MD;  Location: [facility] OR;  Service: Open Heart Surgery;  Laterality: N/A;  cannulate right subclavian  . CORONARY STENT INTERVENTION N/A 05/21/2018   Procedure: CORONARY STENT INTERVENTION;  Surgeon: Corky Crafts, MD;  Location: [facility] INVASIVE CV LAB;  Service: Cardiovascular;  Laterality: N/A;  . ENDOVEIN HARVEST OF GREATER SAPHENOUS VEIN  11/05/2012   Procedure: ENDOVEIN HARVEST OF GREATER SAPHENOUS VEIN;  Surgeon: Kerin Perna, MD;  Location: [facility] OR;  Service: Open Heart Surgery;  Laterality: Right;  . FEMORAL-FEMORAL BYPASS GRAFT Left 06/09/2018   Procedure: REPAIR OF LEFT FEMORAL ARTERY PSEUDO ANEURYSM;  Surgeon: Chuck Hint, MD;  Location: [facility] OR;  Service: Vascular;  Laterality: Left;  . GROIN DEBRIDEMENT Left 06/11/2018   Procedure: EXPLORATION LEFT GROIN FOR BLEEDING;  Surgeon: Chuck Hint, MD;  Location: [facility] OR;  Service: Vascular;  Laterality: Left;  . IABP INSERTION N/A 05/21/2018   Procedure: IABP INSERTION;  Surgeon: Corky Crafts, MD;  Location: [facility] INVASIVE CV LAB;  Service: Cardiovascular;  Laterality: N/A;  . INTRAOPERATIVE TRANSESOPHAGEAL ECHOCARDIOGRAM  11/05/2012   Procedure: INTRAOPERATIVE TRANSESOPHAGEAL ECHOCARDIOGRAM;  Surgeon: Kerin Perna, MD;  Location: [facility] OR;  Service: Open Heart Surgery;  Laterality: N/A;  .  INTRAOPERATIVE TRANSESOPHAGEAL ECHOCARDIOGRAM  N/A 11/21/2012   Procedure: INTRAOPERATIVE TRANSESOPHAGEAL ECHOCARDIOGRAM;  Surgeon: Kerin Perna, MD;  Location: [facility] OR;  Service: Open Heart Surgery;  Laterality: N/A;  . LEFT HEART CATHETERIZATION WITH CORONARY ANGIOGRAM N/A 11/04/2012   Procedure: LEFT HEART CATHETERIZATION WITH CORONARY ANGIOGRAM;  Surgeon: Chrystie Nose, MD;  Location: [facility] CATH LAB;  Service: Cardiovascular;  Laterality: N/A;  . MEDIASTINOSCOPY  08/2005   bronchoscopy & mediastinoscopy for mediastinal adenopathy (Dr. Donata Clay)   . NM MYOCAR PERF WALL MOTION  01/2012   lexiscan; small, fixed apical septal breast attenuation artifact & inferolateral bowel attenuation, no reversible ischemia, post-stress EF 64%, low risk scan   . PERICARDIAL WINDOW N/A 11/21/2012   Procedure: PERICARDIAL WINDOW;  Surgeon: Kerin Perna, MD;  Location: [facility] OR;  Service: Thoracic;  Laterality: N/A;  . REPLACEMENT ASCENDING AORTA  11/05/2012   Procedure: REPLACEMENT ASCENDING AORTA;  Surgeon: Kerin Perna, MD;  Location: [facility] OR;  Service: Open Heart Surgery;  Laterality: N/A;  . STERNAL WIRES REMOVAL N/A 05/26/2019   Procedure: STERNAL WIRE REMOVAL;  Surgeon: Kerin Perna, MD;  Location: [facility] OR;  Service: Thoracic;  Laterality: N/A;  . SUBCLAVIAN ANGIOGRAM  05/10/2000   left subclavian arteriogram; patent graft, mild stenosis (Dr. Amada Kingfisher)   . SUBCLAVIAN BYPASS GRAFT  05/16/1999   left subclavian bypass with 7mm Darcon graft (Dr. Amada Kingfisher)  . TRANSTHORACIC ECHOCARDIOGRAM  11/20/2012   EF 65-70%, mild conc LVH, grade 2 diastolic dysfunction, mech AV, mild MR    FAMHx:  Family History  Problem Relation Age of Onset  . Hypertension Mother   . Colon cancer Neg Hx   . Esophageal cancer Neg Hx   . Pancreatic cancer Neg Hx   . Stomach cancer Neg Hx   . Liver disease Neg Hx     SOCHx:   reports that she has never smoked. She has never used smokeless tobacco. She reports that she does not drink  alcohol and does not use drugs.  ALLERGIES:  Allergies  Allergen Reactions  . Contrast Media [Iodinated Diagnostic Agents] Hives, Itching and Swelling    ROS: Pertinent items noted in HPI and remainder of comprehensive ROS otherwise negative.  HOME MEDS: Current Outpatient Medications  Medication Sig Dispense Refill  . acetaminophen (TYLENOL) 500 MG tablet Take 500 mg by mouth at bedtime as needed (pain).     Marland Kitchen atorvastatin (LIPITOR) 80 MG tablet TAKE 1 TABLET (80 MG TOTAL) BY MOUTH DAILY AT 6 PM. 90 tablet 1  . Calcium Carb-Cholecalciferol (CALCIUM 600+D3 PO) Take 1 tablet by mouth daily.     . clopidogrel (PLAVIX) 75 MG tablet TAKE 1 TABLET BY MOUTH EVERY DAY WITH BREAKFAST 90 tablet 2  . furosemide (LASIX) 40 MG tablet Take 40 mg by mouth daily.    . isosorbide mononitrate (IMDUR) 30 MG 24 hr tablet TAKE 1 TABLET BY MOUTH EVERY DAY 30 tablet 0  . lisinopril (ZESTRIL) 2.5 MG tablet TAKE 1 TABLET BY MOUTH EVERY DAY 90 tablet 3  . Multiple Vitamin (MULTIVITAMIN WITH MINERALS) TABS tablet Take 1 tablet by mouth daily.    . nitroGLYCERIN (NITROSTAT) 0.4 MG SL tablet PLACE 1 TABLET (0.4 MG TOTAL) UNDER THE TONGUE EVERY 5 (FIVE) MINUTES AS NEEDED FOR CHEST PAIN. 25 tablet 6  . pantoprazole (PROTONIX) 40 MG tablet TAKE 1 TABLET BY MOUTH EVERY DAY 90 tablet 3  . polyvinyl alcohol (LIQUIFILM TEARS) 1.4 % ophthalmic solution Place 1 drop into both eyes daily.    Marland Kitchen  potassium chloride (KLOR-CON) 10 MEQ tablet Take 10 mEq by mouth daily.    . traMADol (ULTRAM) 50 MG tablet Take 1 tablet (50 mg total) by mouth every 6 (six) hours as needed. 15 tablet 0  . warfarin (COUMADIN) 5 MG tablet TAKE 1/2 TO 1 TABLET BY MOUTH DAILY OR AS DIRECTED BY COUMADIN CLINIC 90 tablet 0   No current facility-administered medications for this visit.    LABS/IMAGING: No results found for this or any previous visit (from the past 48 hour(s)). No results found.  VITALS: BP (!) 160/72   Pulse 72   Ht 5\' 6"  (1.676  m)   Wt 183 lb 12.8 oz (83.4 kg)   SpO2 99%   BMI 29.67 kg/m   EXAM: General appearance: alert and no distress Neck: no carotid bruit and no JVD Lungs: clear to auscultation bilaterally Heart: regular rate and rhythm and sharp mechanical valve sounds Abdomen: soft, non-tender; bowel sounds normal; no masses,  no organomegaly Extremities: extremities normal, atraumatic, no cyanosis or edema Pulses: 2+ and symmetric Skin: Skin color, texture, turgor normal. No rashes or lesions Neurologic: Grossly normal Psych: Pleasant, normal  EKG: Deferred  ASSESSMENT: 1. Paranoia/delusions-possible paranoid schizophrenia 2. Coronary artery disease status post two-vessel CABG in 11/2012 (LIMA to LAD, SVG to circumflex). 3. NSTEMI - s/p PCI with DES to Ostial SVG-LAD stenosis (06/2016), 95% ostial left main stenosis status post 5.0 x 12 mm Onyx resolute DES (05/2018) 4. 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 Ischemic cardiopathy EF 40-45% with inferior hypokinesis - improved to 60-65% (echo 12/2016) 6. Status post Bentall with mechanical aortic valve for aortic root aneurysm-11/2012 7. Status post pericardial window with hematoma evacuation 8. History of left subclavian stenosis status post bypass in 2001 9. Rheumatoid arthritis 10. Sarcoidosis 11. Dyslipidemia 12. Hypertension  PLAN: 1.   [name] essentially dominated the conversation today about issues with intrusive voices and people coming in and out of her apartment as well as numerous phone calls to the authorities about being spied upon and how many hours she spends covering up her electrical outlets so that "they" cannot get to her.  This is very concerning about a paranoid delusion.  I suspect she could have a paranoid schizophrenia.  We discussed further about trying to find some help with her concerns and she did seem agreeable to talk with somebody about this further.  I would like to refer her to behavioral  health.  I hope she follows through with the referral.  No changes to her medicines otherwise.  Follow-up with me annually or sooner as necessary.  Chrystie Nose, MD, [facility], FACP  [place]  [facility]  Medical Director of the [facility]  [facility]ovascular [facility] Diplomate of the American Board of Clinical Lipidology Attending Cardiologist  Direct Dial: [phone]  Fax: [phone]  Website:  [email or web address] 12/20/2020, 9:43 AM

## 2020-12-30 ENCOUNTER — Other Ambulatory Visit: Payer: Self-pay | Admitting: Internal Medicine

## 2021-02-04 ENCOUNTER — Other Ambulatory Visit: Payer: Self-pay

## 2021-02-04 ENCOUNTER — Ambulatory Visit (INDEPENDENT_AMBULATORY_CARE_PROVIDER_SITE_OTHER): Payer: Managed Care, Other (non HMO)

## 2021-02-04 ENCOUNTER — Telehealth: Payer: Self-pay

## 2021-02-04 DIAGNOSIS — Z7901 Long term (current) use of anticoagulants: Secondary | ICD-10-CM

## 2021-02-04 DIAGNOSIS — Z952 Presence of prosthetic heart valve: Secondary | ICD-10-CM | POA: Diagnosis not present

## 2021-02-04 DIAGNOSIS — Z5181 Encounter for therapeutic drug level monitoring: Secondary | ICD-10-CM | POA: Diagnosis not present

## 2021-02-04 LAB — POCT INR: INR: 2.2 (ref 2.0–3.0)

## 2021-02-04 NOTE — Telephone Encounter (Signed)
lmom to r/s to an earlier appt in the same day to try to beat the storm

## 2021-02-04 NOTE — Patient Instructions (Signed)
continue taking 1 tablet daily, except 1/2 tablet on Monday.  Return in 6 weeks for INR check 

## 2021-02-19 ENCOUNTER — Other Ambulatory Visit: Payer: Self-pay | Admitting: Internal Medicine

## 2021-02-21 NOTE — Telephone Encounter (Signed)
Rx(s) sent to pharmacy electronically.  

## 2021-03-18 ENCOUNTER — Other Ambulatory Visit: Payer: Self-pay

## 2021-03-18 ENCOUNTER — Ambulatory Visit (INDEPENDENT_AMBULATORY_CARE_PROVIDER_SITE_OTHER): Payer: Managed Care, Other (non HMO)

## 2021-03-18 DIAGNOSIS — Z7901 Long term (current) use of anticoagulants: Secondary | ICD-10-CM

## 2021-03-18 DIAGNOSIS — Z952 Presence of prosthetic heart valve: Secondary | ICD-10-CM

## 2021-03-18 DIAGNOSIS — Z5181 Encounter for therapeutic drug level monitoring: Secondary | ICD-10-CM

## 2021-03-18 LAB — POCT INR: INR: 7.5 — AB (ref 2.0–3.0)

## 2021-03-18 NOTE — Patient Instructions (Signed)
HOLD Tonight, Saturday, Sunday and Monday and then continue taking 1 tablet daily, except 1/2 tablet on Monday.  Return in 1 week for INR check

## 2021-03-24 ENCOUNTER — Telehealth: Payer: Self-pay

## 2021-03-24 NOTE — Telephone Encounter (Signed)
Lmom for missed appt and overdue inr

## 2021-03-25 ENCOUNTER — Other Ambulatory Visit: Payer: Self-pay

## 2021-03-25 ENCOUNTER — Ambulatory Visit (INDEPENDENT_AMBULATORY_CARE_PROVIDER_SITE_OTHER): Payer: Managed Care, Other (non HMO) | Admitting: Pharmacist

## 2021-03-25 DIAGNOSIS — Z7901 Long term (current) use of anticoagulants: Secondary | ICD-10-CM | POA: Diagnosis not present

## 2021-03-25 DIAGNOSIS — Z952 Presence of prosthetic heart valve: Secondary | ICD-10-CM

## 2021-03-25 LAB — POCT INR: INR: 1.1 — AB (ref 2.0–3.0)

## 2021-04-25 ENCOUNTER — Other Ambulatory Visit: Payer: Self-pay | Admitting: Internal Medicine

## 2021-04-27 ENCOUNTER — Ambulatory Visit (INDEPENDENT_AMBULATORY_CARE_PROVIDER_SITE_OTHER): Payer: Managed Care, Other (non HMO)

## 2021-04-27 ENCOUNTER — Other Ambulatory Visit: Payer: Self-pay

## 2021-04-27 DIAGNOSIS — Z5181 Encounter for therapeutic drug level monitoring: Secondary | ICD-10-CM

## 2021-04-27 DIAGNOSIS — Z952 Presence of prosthetic heart valve: Secondary | ICD-10-CM

## 2021-04-27 DIAGNOSIS — Z7901 Long term (current) use of anticoagulants: Secondary | ICD-10-CM | POA: Diagnosis not present

## 2021-04-27 LAB — POCT INR: INR: 2.1 (ref 2.0–3.0)

## 2021-04-27 NOTE — Patient Instructions (Signed)
Resume taking 1 tablet daily, except 1/2 tablet on Monday.  Return in 5 week for INR check

## 2021-05-24 ENCOUNTER — Other Ambulatory Visit: Payer: Self-pay | Admitting: Internal Medicine

## 2021-06-03 ENCOUNTER — Telehealth: Payer: Self-pay

## 2021-06-03 NOTE — Telephone Encounter (Signed)
LMOM FOR MISSED APPT  

## 2021-06-08 ENCOUNTER — Telehealth: Payer: Self-pay

## 2021-06-08 NOTE — Telephone Encounter (Signed)
Lmom for overdue inr 

## 2021-06-27 ENCOUNTER — Other Ambulatory Visit: Payer: Self-pay | Admitting: Internal Medicine

## 2021-07-01 ENCOUNTER — Ambulatory Visit (HOSPITAL_COMMUNITY)
Admission: RE | Admit: 2021-07-01 | Discharge: 2021-07-01 | Disposition: A | Discharge status: Home / Self Care | Payer: Managed Care, Other (non HMO) | Source: Ambulatory Visit | Attending: Cardiology | Admitting: Cardiology

## 2021-07-01 ENCOUNTER — Ambulatory Visit (INDEPENDENT_AMBULATORY_CARE_PROVIDER_SITE_OTHER): Payer: Managed Care, Other (non HMO)

## 2021-07-01 ENCOUNTER — Other Ambulatory Visit: Payer: Self-pay

## 2021-07-01 DIAGNOSIS — I6523 Occlusion and stenosis of bilateral carotid arteries: Secondary | ICD-10-CM

## 2021-07-01 DIAGNOSIS — Z7901 Long term (current) use of anticoagulants: Secondary | ICD-10-CM | POA: Diagnosis not present

## 2021-07-01 DIAGNOSIS — Z952 Presence of prosthetic heart valve: Secondary | ICD-10-CM | POA: Diagnosis not present

## 2021-07-01 DIAGNOSIS — Z5181 Encounter for therapeutic drug level monitoring: Secondary | ICD-10-CM

## 2021-07-01 LAB — POCT INR: INR: 2.3 (ref 2.0–3.0)

## 2021-07-01 NOTE — Patient Instructions (Signed)
Resume taking 1 tablet daily, except 1/2 tablet on Monday.  Return in 6 weeks for INR check

## 2021-07-03 ENCOUNTER — Other Ambulatory Visit: Payer: Self-pay | Admitting: Internal Medicine

## 2021-07-20 ENCOUNTER — Other Ambulatory Visit: Payer: Self-pay

## 2021-07-20 ENCOUNTER — Encounter (HOSPITAL_COMMUNITY): Payer: Self-pay | Admitting: Emergency Medicine

## 2021-07-20 ENCOUNTER — Emergency Department (HOSPITAL_COMMUNITY)
Admission: EM | Admit: 2021-07-20 | Discharge: 2021-07-20 | Disposition: A | Discharge status: Home / Self Care | Payer: Managed Care, Other (non HMO) | Attending: Emergency Medicine | Admitting: Emergency Medicine

## 2021-07-20 DIAGNOSIS — N812 Incomplete uterovaginal prolapse: Secondary | ICD-10-CM | POA: Diagnosis not present

## 2021-07-20 DIAGNOSIS — I2510 Atherosclerotic heart disease of native coronary artery without angina pectoris: Secondary | ICD-10-CM | POA: Insufficient documentation

## 2021-07-20 DIAGNOSIS — Z951 Presence of aortocoronary bypass graft: Secondary | ICD-10-CM | POA: Diagnosis not present

## 2021-07-20 DIAGNOSIS — Z7901 Long term (current) use of anticoagulants: Secondary | ICD-10-CM | POA: Insufficient documentation

## 2021-07-20 DIAGNOSIS — N8110 Cystocele, unspecified: Secondary | ICD-10-CM

## 2021-07-20 DIAGNOSIS — R1030 Lower abdominal pain, unspecified: Secondary | ICD-10-CM | POA: Diagnosis present

## 2021-07-20 DIAGNOSIS — I10 Essential (primary) hypertension: Secondary | ICD-10-CM | POA: Diagnosis not present

## 2021-07-20 LAB — URINALYSIS, ROUTINE W REFLEX MICROSCOPIC
Bacteria, UA: NONE SEEN
Bilirubin Urine: NEGATIVE
Glucose, UA: NEGATIVE mg/dL
Hgb urine dipstick: NEGATIVE
Ketones, ur: NEGATIVE mg/dL
Nitrite: NEGATIVE
Protein, ur: NEGATIVE mg/dL
Specific Gravity, Urine: 1.01 (ref 1.005–1.030)
pH: 5 (ref 5.0–8.0)

## 2021-07-20 LAB — WET PREP, GENITAL
Clue Cells Wet Prep HPF POC: NONE SEEN
Sperm: NONE SEEN
Trich, Wet Prep: NONE SEEN
WBC, Wet Prep HPF POC: NONE SEEN
Yeast Wet Prep HPF POC: NONE SEEN

## 2021-07-20 NOTE — ED Triage Notes (Signed)
Patient complains of groin pain and pressure that started several years ago. Reports having a growth in her vagina, also reports worsening urinary incontinence.

## 2021-07-20 NOTE — ED Provider Notes (Signed)
Emergency Medicine Provider Triage Evaluation Note  [name] , a 70 y.o. female  was evaluated in triage.  Pt complains of vaginal mass, frequent urination, and urinary urgency.  Patient reports that she has had frequent urination and urinary urgency over the last 5 days.  Patient also complains of pressure with urination.  Patient reports that she has a mass located inside her vagina.  States this has been there for multiple years, has gotten progressively larger over time.  Patient reports she has not seen an OB/GYN provider or PCP about this issue.  Review of Systems  Positive: Urinary frequency, urinary urgency, vaginal mass Negative: Fever, chills, abdominal pain, vaginal pain, vaginal bleeding, vaginal discharge  Physical Exam  BP (!) 158/112 (BP Location: Right Arm)   Pulse 77   Temp 98.2 F (36.8 C) (Oral)   Resp 18   SpO2 99%  Gen:   Awake, no distress   Resp:  Normal effort  MSK:   Moves extremities without difficulty  Other:  Abdomen soft, nondistended, nontender.  Medical Decision Making  Medically screening exam initiated at 5:05 PM.  Appropriate orders placed.  [name] was informed that the remainder of the evaluation will be completed by another provider, this initial triage assessment does not replace that evaluation, and the importance of remaining in the ED until their evaluation is complete.  Urinary frequency, urinary urgency, vaginal mass   Berneice Heinrich 07/20/21 1706    Benjiman Core, MD 07/20/21 2208

## 2021-07-20 NOTE — Discharge Instructions (Signed)
Please read and follow all provided instructions.  Your diagnoses today include:  1. Vaginal prolapse     Tests performed today include: Urine test: No signs of infection Wet prep: No signs of vaginal infection Vital signs. See below for your results today.   Medications prescribed:  None Take any prescribed medications only as directed.  Home care instructions:  Follow any educational materials contained in this packet.  Follow-up instructions: Please follow-up with the gynecology group listed for further evaluation and treatment.  Return instructions:  Please return to the Emergency Department if you experience worsening symptoms.  Return to the emergency department with worsening pain, if you are not able to urinate. Please return if you have any other emergent concerns.  Additional Information:  Your vital signs today were: BP (!) 163/80   Pulse 68   Temp 98.2 F (36.8 C) (Oral)   Resp 19   SpO2 100%  If your blood pressure (BP) was elevated above 135/85 this visit, please have this repeated by your doctor within one month. --------------

## 2021-07-20 NOTE — ED Provider Notes (Signed)
[facility] EMERGENCY DEPARTMENT Provider Note   CSN: [id number] Arrival date & time: 07/20/21  1654     History Chief Complaint  Patient presents with   Groin Pain    [name] is a 70 y.o. female.  Patient with past history of coronary artery disease status post CABG, chronic Coumadin use due to mechanic aortic valve -- presents to the ED for concern over a vaginal mass and urinary pressure x 5 days. She originally noticed the vaginal mass 5 years ago and states it has gradually been growing since then. She notices that it "swells/grows" when she needs to urinate and will "drop out of her vagina" at that time and she will need to push it out of the way in order to urinate. She admits to increased urinary frequency, urgency, lower abdominal pain and some L sided flank pain. She also admits noticing blood on her toilet tissue when she wipes today after urinating. She admits to one episode of urinary incontinence last night which prompted her to be seen. She denies fever, nausea, vomiting or any bowel changes. She has two children, both delivered vaginally. She tried tylenol for her symptoms w/ no relief.       Past Medical History:  Diagnosis Date   Ascending aortic aneurysm    a.  redo replacement of fusiform aneurysm of ascending aorta (complicated by pericardial hematoma s/p evacuation) in 2014.   Contrast media allergy    Coronary artery disease    a. s/p CABG x2 in 2014. b. s/p DES to SVG-LAD in 2017.   H/O mechanical aortic valve replacement    Hyperlipidemia    Hypertension    Ischemic cardiomyopathy    a. EF 40-45% in 2017 at time of NSTEMI, improved to normal in 2018.   Non-STEMI (non-ST elevated myocardial infarction) (HCC)    Premature atrial contractions    PVC's (premature ventricular contractions)    RA (rheumatoid arthritis) (HCC)    S/P AVR (aortic valve replacement) 08/1999   St. Jude AVR for aortic insuff, Goal INR 2.5-3.5   S/P CABG x  2 11/2012   a. 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 Donata Clay) - complicated by pericardial hematoma --> subxiphoid pericardial exposure and evacuation b. Cath 06/2016: DES to SVG-LAD ostium   Sarcoid    Second degree AV block    a. ? possibly seen in 12/2016 clinic visit - EKG not truly suggestive   Severe anemia    Subclavian artery stenosis (HCC) 06/2000   a. s/p left subclavian bypass.    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   CAD in native artery 05/17/2018   2nd degree AV block 01/12/2017   DOE (dyspnea on exertion) 01/12/2017   Encounter for long-term (current) use of medications 01/12/2017   Acute blood loss anemia 08/15/2016   Black stools 08/15/2016   Aortic valve disease 07/03/2016   Cardiomyopathy, ischemic 07/03/2016   Hx of CABG    NSTEMI (non-ST elevated myocardial infarction) (HCC) 06/20/2016   Acute pericardial effusion post op without tamponade - S/P subxiphoid pericaridal window 11/21/12 11/21/2012   PVD , known Lt SCA stenosis 11/21/2012   Pleural effusion, RT, post op 11/21/2012   Unstable angina on admission 11/04/12 11/04/2012   Coronary artery disease involving native coronary artery of native heart  without angina pectoris 11/04/2012   S/P AVR (aortic valve replacement) 11/04/2012   Dilated ascending aorta at cath 11/03/12 - S/P root replacement 11/05/12 11/04/2012   Chronic anticoagulation for mechanical AVR 11/04/2012   Contrast media allergy 11/04/2012   Essential hypertension 11/04/2012   Sarcoidosis 11/04/2012    Past Surgical History:  Procedure Laterality Date   AORTIC VALVE REPLACEMENT  08/23/1999   46mm St. Jude, Bentall procedure, root replacemtn    BYPASS GRAFT ANGIOGRAPHY  05/20/2018   Procedure: BYPASS GRAFT ANGIOGRAPHY;  Surgeon: Corky Crafts, MD;  Location: [facility]  INVASIVE CV LAB;  Service: Cardiovascular;;   CARDIAC CATHETERIZATION  08/1999   normal L main, LAD & first diagonal are normal, normal Cfx, RCA with mild ostial narrowing, normal LV systolic function, severe AI, mild MR, normal PAP (Dr. Langston Reusing)    CARDIAC CATHETERIZATION N/A 06/22/2016   Procedure: Left Heart Cath and Cors/Grafts Angiography;  Surgeon: Marykay Lex, MD;  Location: [facility] INVASIVE CV LAB;  Service: Cardiovascular;  Laterality: N/A;   CARDIAC CATHETERIZATION N/A 06/23/2016   Procedure: Left Heart Cath and Coronary Angiography;  Surgeon: Tonny Bollman, MD;  Location: [facility] INVASIVE CV LAB;  Service: Cardiovascular;  Laterality: N/A;   CARDIAC CATHETERIZATION N/A 06/23/2016   Procedure: Coronary Stent Intervention;  Surgeon: Tonny Bollman, MD;  Location: [facility] INVASIVE CV LAB;  Service: Cardiovascular;  Laterality: N/A;   CORONARY ARTERY BYPASS GRAFT  11/05/2012   Procedure: CORONARY ARTERY BYPASS GRAFTING (CABG);  Surgeon: Kerin Perna, MD;  Location: [facility] OR;  Service: Open Heart Surgery;  Laterality: N/A;  cannulate right subclavian   CORONARY STENT INTERVENTION N/A 05/21/2018   Procedure: CORONARY STENT INTERVENTION;  Surgeon: Corky Crafts, MD;  Location: [facility] INVASIVE CV LAB;  Service: Cardiovascular;  Laterality: N/A;   ENDOVEIN HARVEST OF GREATER SAPHENOUS VEIN  11/05/2012   Procedure: ENDOVEIN HARVEST OF GREATER SAPHENOUS VEIN;  Surgeon: Kerin Perna, MD;  Location: [facility] OR;  Service: Open Heart Surgery;  Laterality: Right;   FEMORAL-FEMORAL BYPASS GRAFT Left 06/09/2018   Procedure: REPAIR OF LEFT FEMORAL ARTERY PSEUDO ANEURYSM;  Surgeon: Chuck Hint, MD;  Location: [facility] OR;  Service: Vascular;  Laterality: Left;   GROIN DEBRIDEMENT Left 06/11/2018   Procedure: EXPLORATION LEFT GROIN FOR BLEEDING;  Surgeon: Chuck Hint, MD;  Location: [facility] OR;  Service: Vascular;  Laterality: Left;   IABP INSERTION N/A 05/21/2018   Procedure: IABP INSERTION;  Surgeon: Corky Crafts, MD;  Location: [facility] INVASIVE CV LAB;  Service: Cardiovascular;  Laterality: N/A;   INTRAOPERATIVE TRANSESOPHAGEAL ECHOCARDIOGRAM  11/05/2012   Procedure: INTRAOPERATIVE TRANSESOPHAGEAL ECHOCARDIOGRAM;  Surgeon: Kerin Perna, MD;  Location: [facility] OR;  Service: Open Heart Surgery;  Laterality: N/A;   INTRAOPERATIVE TRANSESOPHAGEAL ECHOCARDIOGRAM N/A 11/21/2012   Procedure: INTRAOPERATIVE TRANSESOPHAGEAL ECHOCARDIOGRAM;  Surgeon: Kerin Perna, MD;  Location: [facility] OR;  Service: Open Heart Surgery;  Laterality: N/A;   LEFT HEART CATHETERIZATION WITH CORONARY ANGIOGRAM N/A 11/04/2012   Procedure: LEFT HEART CATHETERIZATION WITH CORONARY ANGIOGRAM;  Surgeon: Chrystie Nose, MD;  Location: [facility] CATH LAB;  Service: Cardiovascular;  Laterality: N/A;   MEDIASTINOSCOPY  08/2005   bronchoscopy & mediastinoscopy for mediastinal adenopathy (Dr. Donata Clay)    NM [facility] PERF WALL MOTION  01/2012   lexiscan; small, fixed apical septal breast attenuation artifact & inferolateral bowel attenuation, no reversible ischemia, post-stress EF 64%, low risk scan    PERICARDIAL WINDOW N/A 11/21/2012   Procedure: PERICARDIAL WINDOW;  Surgeon: Kathlee Nations Trigt,  MD;  Location: [facility] OR;  Service: Thoracic;  Laterality: N/A;   REPLACEMENT ASCENDING AORTA  11/05/2012   Procedure: REPLACEMENT ASCENDING AORTA;  Surgeon: Kerin Perna, MD;  Location[facility] OR;  Service: Open Heart Surgery;  Laterality: N/A;   STERNAL WIRES REMOVAL N/A 05/26/2019   Procedure: STERNAL WIRE REMOVAL;  Surgeon: Kerin Perna, MD;  Location: [facility] OR;  Service: Thoracic;  Laterality: N/A;   SUBCLAVIAN ANGIOGRAM  05/10/2000   left subclavian arteriogram; patent graft, mild stenosis (Dr. Amada Kingfisher)    SUBCLAVIAN BYPASS GRAFT  05/16/1999   left subclavian bypass with 19mm Darcon graft (Dr. Amada Kingfisher)   TRANSTHORACIC ECHOCARDIOGRAM  11/20/2012   EF 65-70%, mild conc LVH, grade 2 diastolic dysfunction, mech AV, mild MR     OB History   No obstetric history on  file.     Family History  Problem Relation Age of Onset   Hypertension Mother    Colon cancer Neg Hx    Esophageal cancer Neg Hx    Pancreatic cancer Neg Hx    Stomach cancer Neg Hx    Liver disease Neg Hx     Social History   Tobacco Use   Smoking status: Never   Smokeless tobacco: Never  Vaping Use   Vaping Use: Never used  Substance Use Topics   Alcohol use: No   Drug use: No    Home Medications Prior to Admission medications   Medication Sig Start Date End Date Taking? Authorizing Provider  acetaminophen (TYLENOL) 500 MG tablet Take 500 mg by mouth at bedtime as needed (pain).     [provider]  atorvastatin (LIPITOR) 80 MG tablet TAKE 1 TABLET BY MOUTH DAILY AT 6 PM. 07/04/21   Hilty, Lisette Abu, MD  Calcium Carb-Cholecalciferol (CALCIUM 600+D3 PO) Take 1 tablet by mouth daily.     [provider]  clopidogrel (PLAVIX) 75 MG tablet TAKE 1 TABLET BY MOUTH EVERY DAY WITH BREAKFAST 07/04/21   Hilty, Lisette Abu, MD  furosemide (LASIX) 40 MG tablet Take 40 mg by mouth daily. 11/14/20   [provider]  isosorbide mononitrate (IMDUR) 30 MG 24 hr tablet TAKE 1 TABLET BY MOUTH EVERY DAY 02/21/21   Hilty, Lisette Abu, MD  lisinopril (ZESTRIL) 2.5 MG tablet TAKE 1 TABLET BY MOUTH EVERY DAY 11/15/20   Hilty, Lisette Abu, MD  Multiple Vitamin (MULTIVITAMIN WITH MINERALS) TABS tablet Take 1 tablet by mouth daily.    [provider]  nitroGLYCERIN (NITROSTAT) 0.4 MG SL tablet PLACE 1 TABLET (0.4 MG TOTAL) UNDER THE TONGUE EVERY 5 (FIVE) MINUTES AS NEEDED FOR CHEST PAIN. 02/13/18   Chrystie Nose, MD  pantoprazole (PROTONIX) 40 MG tablet TAKE 1 TABLET BY MOUTH EVERY DAY 11/15/20   Hilty, Lisette Abu, MD  polyvinyl alcohol (LIQUIFILM TEARS) 1.4 % ophthalmic solution Place 1 drop into both eyes daily.    [provider]  potassium chloride (KLOR-CON) 10 MEQ tablet Take 10 mEq by mouth daily. 11/14/20   [provider]  traMADol (ULTRAM) 50 MG  tablet Take 1 tablet (50 mg total) by mouth every 6 (six) hours as needed. 03/04/20   Lurene Shadow, PA-C  warfarin (COUMADIN) 5 MG tablet TAKE 1/2 TO 1 TABLET BY MOUTH DAILY OR AS DIRECTED BY COUMADIN CLINIC 06/27/21   Hilty, Lisette Abu, MD    Allergies    Contrast media [iodinated diagnostic agents]  Review of Systems   Review of Systems  Constitutional:  Negative for fever.  HENT:  Negative for rhinorrhea and sore throat.   Eyes:  Negative for redness.  Respiratory:  Negative for cough.   Cardiovascular:  Negative for chest pain.  Gastrointestinal:  Negative for abdominal pain, diarrhea, nausea and vomiting.  Genitourinary:  Positive for enuresis, flank pain, frequency, urgency and vaginal pain. Negative for dysuria and hematuria.       Vaginal mass  Musculoskeletal:  Negative for myalgias.  Skin:  Negative for rash.  Neurological:  Negative for headaches.   Physical Exam Updated Vital Signs BP (!) 163/80   Pulse 68   Temp 98.2 F (36.8 C) (Oral)   Resp 19   SpO2 100%   Physical Exam Vitals and nursing note reviewed. Exam conducted with a chaperone present.  Constitutional:      General: She is not in acute distress.    Appearance: She is well-developed.  HENT:     Head: Normocephalic and atraumatic.     Right Ear: External ear normal.     Left Ear: External ear normal.     Nose: Nose normal.  Eyes:     Conjunctiva/sclera: Conjunctivae normal.  Cardiovascular:     Rate and Rhythm: Normal rate and regular rhythm.     Heart sounds: No murmur heard. Pulmonary:     Effort: No respiratory distress.     Breath sounds: No wheezing, rhonchi or rales.  Abdominal:     Palpations: Abdomen is soft.     Tenderness: There is no abdominal tenderness. There is no guarding or rebound.     Comments: Abdomen is soft and nontender  Genitourinary:    Exam position: Lithotomy position.     Labia:        Right: No tenderness.        Left: No tenderness.      Vagina: No foreign body.  Prolapsed vaginal walls present. No vaginal discharge, tenderness or bleeding.     Adnexa:        Right: No mass or tenderness.         Left: No mass or tenderness.       Comments: On exam, patient with vaginal prolapse.  This was easily reduced with manual pressure.  No vaginal bleeding or discharge.  There is a small area of skin breakdown on the prolapse that was protruding furthest through the introitus.  No bleeding.  No adnexal tenderness. Musculoskeletal:     Cervical back: Normal range of motion and neck supple.     Right lower leg: No edema.     Left lower leg: No edema.  Skin:    General: Skin is warm and dry.     Findings: No rash.  Neurological:     General: No focal deficit present.     Mental Status: She is alert. Mental status is at baseline.     Motor: No weakness.  Psychiatric:        Mood and Affect: Mood normal.    ED Results / Procedures / Treatments   Labs (all labs ordered are listed, but only abnormal results are displayed) Labs Reviewed  URINALYSIS, ROUTINE W REFLEX MICROSCOPIC - Abnormal; Notable for the following components:      Result Value   Leukocytes,Ua TRACE (*)    All other components within normal limits  WET PREP, GENITAL  URINE CULTURE    EKG None  Radiology No results found.  Procedures Procedures   Medications Ordered in ED Medications - No data to display  ED Course  I have reviewed the triage vital signs and the nursing notes.  Pertinent labs & imaging results that were available during my care of the patient were reviewed by me and considered in my medical decision making (see chart for details).  Patient seen and examined.  UA reviewed, negative for infection.  Vital signs reviewed and are as follows: BP (!) 163/80   Pulse 68   Temp 98.2 F (36.8 C) (Oral)   Resp 19   SpO2 100%   Pelvic exam performed with RN chaperone.  Patient with vaginal prolapse noted.  This was easily reduced.  Discussed findings with patient.   She will need to follow-up with a gynecologist.  She requires a referral and she was given information for [facility] OB/GYN clinic.  Encouraged return to emergency department with worsening pain, bleeding, inability to urinate.    MDM Rules/Calculators/A&P                           Patient here with lower abdominal pressure, urinary symptoms but no retention, vaginal prolapse on exam.  It sounds the patient has had this for several years, however now interfering with urination and causing some incontinence.  She will need to follow-up with OB/GYN, referral given.  No signs of urine or vaginal infection.   Final Clinical Impression(s) / ED Diagnoses Final diagnoses:  Vaginal prolapse    Rx / DC Orders ED Discharge Orders     None        Renne Crigler, Cordelia Poche 07/20/21 2101    Ernie Avena, MD 07/20/21 2115

## 2021-07-22 LAB — URINE CULTURE

## 2021-07-23 ENCOUNTER — Other Ambulatory Visit: Payer: Self-pay | Admitting: Internal Medicine

## 2021-07-25 NOTE — Telephone Encounter (Signed)
Prescription refill request received for warfarin Lov: Hilty, 12/20/2020 Next INR check: 11/11 Warfarin tablet strength: 5mg 

## 2021-07-28 ENCOUNTER — Other Ambulatory Visit (HOSPITAL_BASED_OUTPATIENT_CLINIC_OR_DEPARTMENT_OTHER): Payer: Self-pay | Admitting: Internal Medicine

## 2021-07-28 DIAGNOSIS — Z95828 Presence of other vascular implants and grafts: Secondary | ICD-10-CM

## 2021-08-12 ENCOUNTER — Ambulatory Visit (INDEPENDENT_AMBULATORY_CARE_PROVIDER_SITE_OTHER): Payer: Managed Care, Other (non HMO) | Admitting: Pharmacist Clinician (PhC)/ Clinical Pharmacy Specialist

## 2021-08-12 ENCOUNTER — Other Ambulatory Visit: Payer: Self-pay

## 2021-08-12 DIAGNOSIS — Z7901 Long term (current) use of anticoagulants: Secondary | ICD-10-CM | POA: Diagnosis not present

## 2021-08-12 DIAGNOSIS — Z952 Presence of prosthetic heart valve: Secondary | ICD-10-CM | POA: Diagnosis not present

## 2021-08-12 LAB — POCT INR: INR: 3.2 — AB (ref 2.0–3.0)

## 2021-08-24 ENCOUNTER — Other Ambulatory Visit: Payer: Self-pay | Admitting: Internal Medicine

## 2021-09-04 ENCOUNTER — Other Ambulatory Visit: Payer: Self-pay | Admitting: Internal Medicine

## 2021-09-09 ENCOUNTER — Ambulatory Visit (INDEPENDENT_AMBULATORY_CARE_PROVIDER_SITE_OTHER): Payer: Managed Care, Other (non HMO)

## 2021-09-09 ENCOUNTER — Other Ambulatory Visit: Payer: Self-pay

## 2021-09-09 DIAGNOSIS — Z952 Presence of prosthetic heart valve: Secondary | ICD-10-CM

## 2021-09-09 DIAGNOSIS — Z5181 Encounter for therapeutic drug level monitoring: Secondary | ICD-10-CM | POA: Diagnosis not present

## 2021-09-09 DIAGNOSIS — Z7901 Long term (current) use of anticoagulants: Secondary | ICD-10-CM

## 2021-09-09 LAB — POCT INR: INR: 2.8 (ref 2.0–3.0)

## 2021-09-09 NOTE — Patient Instructions (Signed)
continue taking 1 tablet daily, except 1/2 tablet on Monday.  Return in 6 weeks for INR check

## 2021-09-15 ENCOUNTER — Other Ambulatory Visit: Payer: Self-pay | Admitting: Internal Medicine

## 2021-09-20 ENCOUNTER — Telehealth: Payer: Self-pay | Admitting: Internal Medicine

## 2021-09-20 ENCOUNTER — Other Ambulatory Visit: Payer: Self-pay

## 2021-09-20 ENCOUNTER — Ambulatory Visit (INDEPENDENT_AMBULATORY_CARE_PROVIDER_SITE_OTHER): Payer: Managed Care, Other (non HMO) | Admitting: *Deleted

## 2021-09-20 DIAGNOSIS — I441 Atrioventricular block, second degree: Secondary | ICD-10-CM | POA: Diagnosis not present

## 2021-09-20 DIAGNOSIS — Z5181 Encounter for therapeutic drug level monitoring: Secondary | ICD-10-CM

## 2021-09-20 DIAGNOSIS — Z952 Presence of prosthetic heart valve: Secondary | ICD-10-CM

## 2021-09-20 DIAGNOSIS — I2510 Atherosclerotic heart disease of native coronary artery without angina pectoris: Secondary | ICD-10-CM

## 2021-09-20 DIAGNOSIS — Z7901 Long term (current) use of anticoagulants: Secondary | ICD-10-CM

## 2021-09-20 DIAGNOSIS — Z951 Presence of aortocoronary bypass graft: Secondary | ICD-10-CM

## 2021-09-20 LAB — POCT INR: INR: 2.9 (ref 2.0–3.0)

## 2021-09-20 NOTE — Progress Notes (Signed)
Patient seen after call from NP at worksite.  Irregular heart beat, nosebleed yesterday, headache now on day 3-4.  EKG showed irregular rhythm - reviewed by Dr. Royann Shivers.  No AF present.   INR therapeutic Patient was reviewed by Dr. Royann Shivers, no neurological concerns.  Patient was advised on stroke precautions, when to call 911 or go to ED.

## 2021-09-20 NOTE — Telephone Encounter (Signed)
Terri Piedra NP at [employer] place of employment is calling stating [name] came in and saw her and reported she has been having headaches for 4 days and started having nose bleeds last night. Jeanice Lim states when she listened to her heart it didn't sound normal and she thinks she could possibly be in Afib, she is also worried about her INR due to the bleeding. She does not have a EKG machine at that clinic so she was unable to confirm if she is in Afib or not. She is requesting she be worked in for an appt today. She left her cell number for callback.

## 2021-09-20 NOTE — Telephone Encounter (Signed)
Communicated with Belenda Cruise [facility] via secure chat. OK to add on for INR check today @ 1:45pm with EKG  Spoke with [facility] NP and relayed this message. She will notify patient of appointment

## 2021-09-20 NOTE — Patient Instructions (Signed)
Description   continue taking 1 tablet daily, except 1/2 tablet on Monday.  Return in 6 weeks for INR check

## 2021-09-22 ENCOUNTER — Other Ambulatory Visit: Payer: Self-pay

## 2021-09-22 ENCOUNTER — Emergency Department
Admission: EM | Admit: 2021-09-22 | Discharge: 2021-09-22 | Disposition: A | Discharge status: Home / Self Care | Payer: Managed Care, Other (non HMO) | Source: Home / Self Care

## 2021-09-22 DIAGNOSIS — R42 Dizziness and giddiness: Secondary | ICD-10-CM | POA: Diagnosis not present

## 2021-09-22 DIAGNOSIS — R9431 Abnormal electrocardiogram [ECG] [EKG]: Secondary | ICD-10-CM | POA: Diagnosis not present

## 2021-09-22 DIAGNOSIS — R519 Headache, unspecified: Secondary | ICD-10-CM | POA: Diagnosis not present

## 2021-09-22 MED ORDER — ONDANSETRON 4 MG PO TBDP
4.0000 mg | ORAL_TABLET | Freq: Once | ORAL | Status: AC
  Administered 2021-09-22: 17:00:00 4 mg via ORAL

## 2021-09-22 NOTE — ED Triage Notes (Addendum)
Pt presents to Urgent Care with c/o headache x 4 days, unrelieved by Tylenol and Goodies powder. Also reports dizziness and emesis x 2 since last night. Denies blurred vision and slurred speech. Grips equal and strong, smile symmetrical.

## 2021-09-22 NOTE — ED Provider Notes (Signed)
[facility]    CSN: [id number] Arrival date & time: 09/22/21  1702      History   Chief Complaint Chief Complaint  Patient presents with   Headache   Dizziness   Emesis    HPI Darsi Ferrucci is a 70 y.o. female.   HPI 70 year old female presents with worsening headache for 4 days unrelieved by Tylenol and Goody powders.  Additionally, reports worsening dizziness and emesis x 2 last night.  Denies blurred vision or slurred speech.  PMH significant for HTN, chronic anticoagulation (currently on Plavix and Coumadin) for mechanical AVR, ischemic cardiomyopathy, status post CABG x 2, second degree AV block, subclavian artery stenosis, PVD, PVC's, and history of severe anemia.  Past Medical History:  Diagnosis Date   Ascending aortic aneurysm    a.  redo replacement of fusiform aneurysm of ascending aorta (complicated by pericardial hematoma s/p evacuation) in 2014.   Contrast media allergy    Coronary artery disease    a. s/p CABG x2 in 2014. b. s/p DES to SVG-LAD in 2017.   H/O mechanical aortic valve replacement    Hyperlipidemia    Hypertension    Ischemic cardiomyopathy    a. EF 40-45% in 2017 at time of NSTEMI, improved to normal in 2018.   Non-STEMI (non-ST elevated myocardial infarction) ([place])    Premature atrial contractions    PVC's (premature ventricular contractions)    RA (rheumatoid arthritis) (HCC)    S/P AVR (aortic valve replacement) 08/1999   St. 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 Prescott Gum) - complicated by pericardial hematoma --> subxiphoid pericardial exposure and evacuation b. Cath 06/2016: DES to SVG-LAD ostium   Sarcoid    Second degree AV block    a. ? possibly seen in 12/2016 clinic visit - EKG not truly suggestive   Severe anemia    Subclavian artery stenosis ([place]) 06/2000   a. s/p left subclavian  bypass.    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   CAD in native artery 05/17/2018   2nd degree AV block 01/12/2017   DOE (dyspnea on exertion) 01/12/2017   Encounter for long-term (current) use of medications 01/12/2017   Acute blood loss anemia 08/15/2016   Black stools 08/15/2016   Aortic valve disease 07/03/2016   Cardiomyopathy, ischemic 07/03/2016   Hx of CABG    NSTEMI (non-ST elevated myocardial infarction) ([place]) 06/20/2016   Acute pericardial effusion post op without tamponade - S/P subxiphoid pericaridal window 11/21/12 11/21/2012   PVD , known Lt SCA stenosis 11/21/2012   Pleural effusion, RT, post op 11/21/2012   Unstable angina on admission 11/04/12 11/04/2012   Coronary artery disease involving native coronary artery of native heart without angina pectoris 11/04/2012   S/P AVR (aortic valve replacement) 11/04/2012   Dilated ascending aorta at cath 11/03/12 - S/P root replacement 11/05/12 11/04/2012   Chronic anticoagulation for mechanical AVR 11/04/2012   Contrast media allergy 11/04/2012   Essential hypertension 11/04/2012   Sarcoidosis 11/04/2012    Past Surgical History:  Procedure Laterality Date   AORTIC VALVE REPLACEMENT  08/23/1999   30mm St. Jude, [place] procedure, root replacemtn    BYPASS GRAFT ANGIOGRAPHY  05/20/2018   Procedure: BYPASS GRAFT  ANGIOGRAPHY;  Surgeon: Corky Crafts, MD;  Location: [facility] INVASIVE CV LAB;  Service: Cardiovascular;;   CARDIAC CATHETERIZATION  08/1999   normal L main, LAD & first diagonal are normal, normal Cfx, RCA with mild ostial narrowing, normal LV systolic function, severe AI, mild MR, normal PAP (Dr. Langston Reusing)    CARDIAC CATHETERIZATION N/A 06/22/2016   Procedure: Left Heart Cath and Cors/Grafts Angiography;  Surgeon: Marykay Lex, MD;  Location: [facility] INVASIVE CV LAB;  Service: Cardiovascular;   Laterality: N/A;   CARDIAC CATHETERIZATION N/A 06/23/2016   Procedure: Left Heart Cath and Coronary Angiography;  Surgeon: Tonny Bollman, MD;  Location: [facility] INVASIVE CV LAB;  Service: Cardiovascular;  Laterality: N/A;   CARDIAC CATHETERIZATION N/A 06/23/2016   Procedure: Coronary Stent Intervention;  Surgeon: Tonny Bollman, MD;  Location: [facility] INVASIVE CV LAB;  Service: Cardiovascular;  Laterality: N/A;   CORONARY ARTERY BYPASS GRAFT  11/05/2012   Procedure: CORONARY ARTERY BYPASS GRAFTING (CABG);  Surgeon: Kerin Perna, MD;  Location: [facility] OR;  Service: Open Heart Surgery;  Laterality: N/A;  cannulate right subclavian   CORONARY STENT INTERVENTION N/A 05/21/2018   Procedure: CORONARY STENT INTERVENTION;  Surgeon: Corky Crafts, MD;  Location: [facility] INVASIVE CV LAB;  Service: Cardiovascular;  Laterality: N/A;   ENDOVEIN HARVEST OF GREATER SAPHENOUS VEIN  11/05/2012   Procedure: ENDOVEIN HARVEST OF GREATER SAPHENOUS VEIN;  Surgeon: Kerin Perna, MD;  Location: [facility] OR;  Service: Open Heart Surgery;  Laterality: Right;   FEMORAL-FEMORAL BYPASS GRAFT Left 06/09/2018   Procedure: REPAIR OF LEFT FEMORAL ARTERY PSEUDO ANEURYSM;  Surgeon: Chuck Hint, MD;  Location: [facility] OR;  Service: Vascular;  Laterality: Left;   GROIN DEBRIDEMENT Left 06/11/2018   Procedure: EXPLORATION LEFT GROIN FOR BLEEDING;  Surgeon: Chuck Hint, MD;  Location: [facility] OR;  Service: Vascular;  Laterality: Left;   IABP INSERTION N/A 05/21/2018   Procedure: IABP INSERTION;  Surgeon: Corky Crafts, MD;  Location: [facility] INVASIVE CV LAB;  Service: Cardiovascular;  Laterality: N/A;   INTRAOPERATIVE TRANSESOPHAGEAL ECHOCARDIOGRAM  11/05/2012   Procedure: INTRAOPERATIVE TRANSESOPHAGEAL ECHOCARDIOGRAM;  Surgeon: Kerin Perna, MD;  Location: [facility] OR;  Service: Open Heart Surgery;  Laterality: N/A;   INTRAOPERATIVE TRANSESOPHAGEAL ECHOCARDIOGRAM N/A 11/21/2012   Procedure: INTRAOPERATIVE TRANSESOPHAGEAL ECHOCARDIOGRAM;  Surgeon:  Kerin Perna, MD;  Location: [facility] OR;  Service: Open Heart Surgery;  Laterality: N/A;   LEFT HEART CATHETERIZATION WITH CORONARY ANGIOGRAM N/A 11/04/2012   Procedure: LEFT HEART CATHETERIZATION WITH CORONARY ANGIOGRAM;  Surgeon: Chrystie Nose, MD;  Location: [facility] CATH LAB;  Service: Cardiovascular;  Laterality: N/A;   MEDIASTINOSCOPY  08/2005   bronchoscopy & mediastinoscopy for mediastinal adenopathy (Dr. Donata Clay)    NM MYOCAR PERF WALL MOTION  01/2012   lexiscan; small, fixed apical septal breast attenuation artifact & inferolateral bowel attenuation, no reversible ischemia, post-stress EF 64%, low risk scan    PERICARDIAL WINDOW N/A 11/21/2012   Procedure: PERICARDIAL WINDOW;  Surgeon: Kerin Perna, MD;  Location: [facility] OR;  Service: Thoracic;  Laterality: N/A;   REPLACEMENT ASCENDING AORTA  11/05/2012   Procedure: REPLACEMENT ASCENDING AORTA;  Surgeon: Kerin Perna, MD;  Location: [facility] OR;  Service: Open Heart Surgery;  Laterality: N/A;   STERNAL WIRES REMOVAL N/A 05/26/2019   Procedure: STERNAL WIRE REMOVAL;  Surgeon: Kerin Perna, MD;  Location: [facility] OR;  Service: Thoracic;  Laterality: N/A;   SUBCLAVIAN ANGIOGRAM  05/10/2000   left subclavian arteriogram; patent graft, mild stenosis (Dr. Reece Agar.  [place])    SUBCLAVIAN BYPASS GRAFT  05/16/1999   left subclavian bypass with 25mm Darcon graft (Dr. Delton See)   TRANSTHORACIC ECHOCARDIOGRAM  11/20/2012   EF 65-70%, mild conc LVH, grade 2 diastolic dysfunction, mech AV, mild MR    OB History   No obstetric history on file.      Home Medications    Prior to Admission medications   Medication Sig Start Date End Date Taking? Authorizing Provider  acetaminophen (TYLENOL) 500 MG tablet Take 500 mg by mouth at bedtime as needed (pain).     [provider]  atorvastatin (LIPITOR) 80 MG tablet TAKE 1 TABLET BY MOUTH DAILY AT 6 PM. 07/04/21   Hilty, Nadean Corwin, MD  Calcium Carb-Cholecalciferol (CALCIUM 600+D3 PO) Take 1 tablet by mouth daily.      [provider]  clopidogrel (PLAVIX) 75 MG tablet TAKE 1 TABLET BY MOUTH EVERY DAY WITH BREAKFAST 07/04/21   Hilty, Nadean Corwin, MD  furosemide (LASIX) 40 MG tablet Take 40 mg by mouth daily. 11/14/20   [provider]  isosorbide mononitrate (IMDUR) 30 MG 24 hr tablet TAKE 1 TABLET BY MOUTH EVERY DAY 02/21/21   Hilty, Nadean Corwin, MD  lisinopril (ZESTRIL) 2.5 MG tablet TAKE 1 TABLET BY MOUTH EVERY DAY 09/05/21   Hilty, Nadean Corwin, MD  Multiple Vitamin (MULTIVITAMIN WITH MINERALS) TABS tablet Take 1 tablet by mouth daily.    [provider]  nitroGLYCERIN (NITROSTAT) 0.4 MG SL tablet PLACE 1 TABLET (0.4 MG TOTAL) UNDER THE TONGUE EVERY 5 (FIVE) MINUTES AS NEEDED FOR CHEST PAIN. 02/13/18   Pixie Casino, MD  pantoprazole (PROTONIX) 40 MG tablet TAKE 1 TABLET BY MOUTH EVERY DAY 11/15/20   Hilty, Nadean Corwin, MD  polyvinyl alcohol (LIQUIFILM TEARS) 1.4 % ophthalmic solution Place 1 drop into both eyes daily.    [provider]  potassium chloride (KLOR-CON) 10 MEQ tablet Take 10 mEq by mouth daily. 11/14/20   [provider]  traMADol (ULTRAM) 50 MG tablet Take 1 tablet (50 mg total) by mouth every 6 (six) hours as needed. 03/04/20   Noe Gens, PA-C  warfarin (COUMADIN) 5 MG tablet TAKE 1/2 TO 1 TABLET BY MOUTH DAILY OR AS DIRECTED BY COUMADIN CLINIC 09/15/21   Hilty, Nadean Corwin, MD    Family History Family History  Problem Relation Age of Onset   Hypertension Mother    Colon cancer Neg Hx    Esophageal cancer Neg Hx    Pancreatic cancer Neg Hx    Stomach cancer Neg Hx    Liver disease Neg Hx     Social History Social History   Tobacco Use   Smoking status: Never   Smokeless tobacco: Never  Vaping Use   Vaping Use: Never used  Substance Use Topics   Alcohol use: No   Drug use: No     Allergies   Contrast media [iodinated diagnostic agents]   Review of Systems Review of Systems  Neurological:  Positive for dizziness and headaches.     Physical Exam Triage Vital Signs ED Triage Vitals  Enc Vitals Group     BP 09/22/21 1725 (!) 181/62     Pulse Rate 09/22/21 1725 65     Resp 09/22/21 1725 20     Temp 09/22/21 1725 98.8 F (37.1 C)     Temp Source 09/22/21 1725 Oral     SpO2 09/22/21 1725 98 %     Weight 09/22/21 1721 191 lb (86.6  kg)     Height 09/22/21 1721 5' 6.5" (1.689 m)     Head Circumference --      Peak Flow --      Pain Score 09/22/21 1720 10     Pain Loc --      Pain Edu? --      Excl. in [place]? --    No data found.  Updated Vital Signs BP (!) 167/66 (BP Location: Right Arm)    Pulse 65    Temp 98.8 F (37.1 C) (Oral)    Resp 20    Ht 5' 6.5" (1.689 m)    Wt 191 lb (86.6 kg)    SpO2 98%    BMI 30.37 kg/m      Physical Exam Vitals reviewed.  Constitutional:      Appearance: She is well-developed and normal weight. She is ill-appearing.     Comments: Lethargic in appearance  HENT:     Head: Normocephalic and atraumatic.     Mouth/Throat:     Mouth: Mucous membranes are moist.     Pharynx: Oropharynx is clear.  Eyes:     Extraocular Movements: Extraocular movements intact.     Pupils: Pupils are equal, round, and reactive to light.  Neck:     Comments: No JVD, no bruit Cardiovascular:     Rate and Rhythm: Rhythm irregular.     Comments: Irregularly irregular, with 2/6 HSE murmur noted; lower extremities +2 pitting/nonpitting edema noted Pulmonary:     Effort: Pulmonary effort is normal.     Breath sounds: Normal breath sounds. No wheezing or rales.     Comments: Diminished breath sounds noted throughout Musculoskeletal:     Cervical back: Normal range of motion and neck supple.     Right lower leg: Edema present.     Left lower leg: Edema present.  Skin:    General: Skin is warm and dry.     Capillary Refill: Capillary refill takes 2 to 3 seconds.  Neurological:     General: No focal deficit present.     Mental Status: She is oriented to person, place, and time. She is lethargic.      Cranial Nerves: No cranial nerve deficit or dysarthria.     Coordination: Coordination abnormal.     Gait: Gait normal.    Immediately after my exam EMS was notified d/t currently in A. Fib/irregularly irregular HR with worsening headache, worsening dizziness combined with the patient's past medical history my concern is for TIA/CVA. UC Treatments / Results  Labs (all labs ordered are listed, but only abnormal results are displayed) Labs Reviewed - No data to display  EKG   Radiology No results found.  Procedures Procedures (including critical care time)  Medications Ordered in UC Medications  ondansetron (ZOFRAN-ODT) disintegrating tablet 4 mg (4 mg Oral Given 09/22/21 1727)    Initial Impression / Assessment and Plan / UC Course  I have reviewed the triage vital signs and the nursing notes.  Pertinent labs & imaging results that were available during my care of the patient were reviewed by me and considered in my medical decision making (see chart for details).     MDM: 1.  Dizziness-no JVD, no bruit on exam patient continues to complain of dizziness while seated recumbent on exam table; 2. Abnormal EKG-EKG reveals sinus rhythm with premature supraventricular complexes with occasional PVC  Nonspecific ST and T wave abnormality; 3.  Worsening headache-transported via EMS to [facility] ED family  members notified @ 1810. Final Clinical Impressions(s) / UC Diagnoses   Final diagnoses:  Dizziness  Nonspecific abnormal electrocardiogram (ECG) (EKG)  Bad headache     Discharge Instructions      Patient transported via EMS to [facility] ED, family members notified at 1810.     ED Prescriptions   None    PDMP not reviewed this encounter.   Eliezer Lofts, [place] 09/22/21 [phone]

## 2021-09-22 NOTE — Discharge Instructions (Addendum)
Patient transported via EMS to [facility] ED, family members notified at 40.

## 2021-09-22 NOTE — ED Notes (Signed)
Patient is being discharged from the Urgent Care and sent to the Emergency Department via EMS. Per Trevor Iha, patient is in need of higher level of care due to headache, dizziness, and abnormal ekg (cardiac hx). Patient is aware and verbalizes understanding of plan of care.  Vitals:   09/22/21 1725 09/22/21 1726  BP: (!) 181/62 (!) 167/66  Pulse: 65   Resp: 20   Temp: 98.8 F (37.1 C)   SpO2: 98%

## 2021-09-22 NOTE — ED Notes (Signed)
911 called per order of M. Ave Filter, FNP

## 2021-09-22 NOTE — ED Notes (Signed)
Pt d/c'd via EMS. Lethargic, oriented, VSS.

## 2021-09-22 NOTE — ED Notes (Signed)
Pt lethargic and oriented, lying down on exam table. Unsuccessful attempt to start a saline lock to R AC (ordered per M. Ave Filter, FNP). Due to limited vein selection, okay to wait for EMS to attempt IV line per M. Ave Filter, FNP.

## 2021-09-22 NOTE — ED Notes (Signed)
Line attempted to be started by Kem Boroughs, RN. Stick unsuccessful. Will let EMS start line if necessary.

## 2021-10-03 ENCOUNTER — Other Ambulatory Visit: Payer: Self-pay | Admitting: Internal Medicine

## 2021-10-03 DIAGNOSIS — Z952 Presence of prosthetic heart valve: Secondary | ICD-10-CM

## 2021-10-03 DIAGNOSIS — Z7901 Long term (current) use of anticoagulants: Secondary | ICD-10-CM

## 2021-10-24 ENCOUNTER — Telehealth: Payer: Self-pay

## 2021-10-24 NOTE — Telephone Encounter (Signed)
Lpm to schedule INR appointment.

## 2021-10-27 ENCOUNTER — Telehealth: Payer: Self-pay

## 2021-10-27 NOTE — Telephone Encounter (Signed)
Lpmtcb and schedule INR check 

## 2021-10-31 ENCOUNTER — Telehealth: Payer: Self-pay

## 2021-10-31 NOTE — Telephone Encounter (Signed)
Lpmtcb and schedule INR 

## 2021-11-02 ENCOUNTER — Telehealth: Payer: Self-pay

## 2021-11-02 NOTE — Telephone Encounter (Signed)
Lpmtcb and schedule INR Check

## 2021-11-04 ENCOUNTER — Telehealth: Payer: Self-pay

## 2021-11-04 NOTE — Telephone Encounter (Signed)
Lpmtcb and schedule INR check 

## 2021-11-11 ENCOUNTER — Telehealth: Payer: Self-pay

## 2021-11-11 NOTE — Telephone Encounter (Signed)
Lpmtcb and schedule INR check 

## 2021-11-18 ENCOUNTER — Other Ambulatory Visit: Payer: Self-pay | Admitting: Internal Medicine

## 2021-11-25 ENCOUNTER — Telehealth: Payer: Self-pay

## 2021-11-25 NOTE — Telephone Encounter (Signed)
Tried calling patient to schedule INR check, but mailbox full

## 2021-12-01 ENCOUNTER — Other Ambulatory Visit: Payer: Self-pay | Admitting: Internal Medicine

## 2021-12-23 ENCOUNTER — Other Ambulatory Visit: Payer: Self-pay

## 2021-12-23 ENCOUNTER — Emergency Department
Admission: EM | Admit: 2021-12-23 | Discharge: 2021-12-23 | Disposition: A | Discharge status: Home / Self Care | Payer: Managed Care, Other (non HMO) | Source: Home / Self Care

## 2021-12-23 DIAGNOSIS — H109 Unspecified conjunctivitis: Secondary | ICD-10-CM | POA: Diagnosis not present

## 2021-12-23 MED ORDER — MOXIFLOXACIN HCL 0.5 % OP SOLN: 1.0000 [drp] | Freq: Three times a day (TID) | OPHTHALMIC | 0 refills | Status: AC

## 2021-12-23 NOTE — Discharge Instructions (Addendum)
Advised/instructed patient to instill eyedrops as directed.  Advised if left eye develops similar symptoms as current right eye has may treat right eye as well.  Advised patient if symptoms worsen and/or unresolved please follow-up with ophthalmology or optometry for further evaluation. ?

## 2021-12-23 NOTE — ED Provider Notes (Signed)
?[facility] ? ? ? ?CSN: [id number] ?Arrival date & time: 12/23/21  1258 ? ? ?  ? ?History   ?Chief Complaint ?Chief Complaint  ?Patient presents with  ? Eye Problem  ?  RT  ? ? ?HPI ?[name] is a 71 y.o. female.  ? ?HPI 71 year old female presents with PMH significant for CAD (s/p CABG x2 in 2014), history of mechanical aortic valve replacement, subclavian artery or stenosis, HTN, and ischemic cardiomyopathy.  Patient is currently on Coumadin and Plavix and denies any unusual bleeding.  Patient is accompanied by her daughter this afternoon. ? ?Past Medical History:  ?Diagnosis Date  ? Ascending aortic aneurysm   ? a.  redo replacement of fusiform aneurysm of ascending aorta (complicated by pericardial hematoma s/p evacuation) in 2014.  ? Contrast media allergy   ? Coronary artery disease   ? a. s/p CABG x2 in 2014. b. s/p DES to SVG-LAD in 2017.  ? H/O mechanical aortic valve replacement   ? Hyperlipidemia   ? Hypertension   ? Ischemic cardiomyopathy   ? a. EF 40-45% in 2017 at time of NSTEMI, improved to normal in 2018.  ? Non-STEMI (non-ST elevated myocardial infarction) (HCC)   ? Premature atrial contractions   ? PVC's (premature ventricular contractions)   ? RA (rheumatoid arthritis) (HCC)   ? S/P AVR (aortic valve replacement) 08/1999  ? St. Jude AVR for aortic insuff, Goal INR 2.5-3.5  ? S/P CABG x 2 11/2012  ? a. 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 Donata ClayVan Trigt) - complicated by pericardial hematoma --> subxiphoid pericardial exposure and evacuation b. Cath 06/2016: DES to SVG-LAD ostium  ? Sarcoid   ? Second degree AV block   ? a. ? possibly seen in 12/2016 clinic visit - EKG not truly suggestive  ? Severe anemia   ? Subclavian artery stenosis (HCC) 06/2000  ? a. s/p left subclavian bypass.  ? ? ?Patient Active Problem List  ? Diagnosis Date Noted  ? Anemia 06/21/2018  ? History of mechanical aortic valve replacement   ? Pseudoaneurysm  (HCC) 06/07/2018  ? CAD in native artery 05/17/2018  ? 2nd degree AV block 01/12/2017  ? DOE (dyspnea on exertion) 01/12/2017  ? Encounter for long-term (current) use of medications 01/12/2017  ? Acute blood loss anemia 08/15/2016  ? Black stools 08/15/2016  ? Aortic valve disease 07/03/2016  ? Cardiomyopathy, ischemic 07/03/2016  ? Hx of CABG   ? NSTEMI (non-ST elevated myocardial infarction) (HCC) 06/20/2016  ? Acute pericardial effusion post op without tamponade - S/P subxiphoid pericaridal window 11/21/12 11/21/2012  ? PVD , known Lt SCA stenosis 11/21/2012  ? Pleural effusion, RT, post op 11/21/2012  ? Unstable angina on admission 11/04/12 11/04/2012  ? Coronary artery disease involving native coronary artery of native heart without angina pectoris 11/04/2012  ? S/P AVR (aortic valve replacement) 11/04/2012  ? Dilated ascending aorta at cath 11/03/12 - S/P root replacement 11/05/12 11/04/2012  ? Chronic anticoagulation for mechanical AVR 11/04/2012  ? Contrast media allergy 11/04/2012  ? Essential hypertension 11/04/2012  ? Sarcoidosis 11/04/2012  ? ? ?Past Surgical History:  ?Procedure Laterality Date  ? AORTIC VALVE REPLACEMENT  08/23/1999  ? 23mm St. Jude, [place] procedure, root replacemtn   ? BYPASS GRAFT ANGIOGRAPHY  05/20/2018  ? Procedure: BYPASS GRAFT ANGIOGRAPHY;  Surgeon: Corky CraftsVaranasi, Jayadeep S, MD;  [facility];  Service: Cardiovascular;;  ? CARDIAC CATHETERIZATION  08/1999  ? normal L main, LAD & first diagonal are normal, normal Cfx, RCA with mild ostial narrowing, normal LV systolic function, severe AI, mild MR, normal PAP (Dr. Langston Reusing)   ? CARDIAC CATHETERIZATION N/A 06/22/2016  ? Procedure: Left Heart Cath and Cors/Grafts Angiography;  Surgeon: Marykay Lex, MD;  Location: [facility] INVASIVE CV LAB;  Service: Cardiovascular;  Laterality: N/A;  ? CARDIAC CATHETERIZATION N/A 06/23/2016  ? Procedure: Left Heart Cath and Coronary Angiography;  Surgeon: Tonny Bollman, MD;  Location: [facility] INVASIVE  CV LAB;  Service: Cardiovascular;  Laterality: N/A;  ? CARDIAC CATHETERIZATION N/A 06/23/2016  ? Procedure: Coronary Stent Intervention;  Surgeon: Tonny Bollman, MD;  Location: [facility] INVASIVE CV LAB;  Service: Cardiovascular;  Laterality: N/A;  ? CORONARY ARTERY BYPASS GRAFT  11/05/2012  ? Procedure: CORONARY ARTERY BYPASS GRAFTING (CABG);  Surgeon: Kerin Perna, MD;  Location: [facility] OR;  Service: Open Heart Surgery;  Laterality: N/A;  cannulate right subclavian  ? CORONARY STENT INTERVENTION N/A 05/21/2018  ? Procedure: CORONARY STENT INTERVENTION;  Surgeon: Corky Crafts, MD;  Location: [facility] INVASIVE CV LAB;  Service: Cardiovascular;  Laterality: N/A;  ? ENDOVEIN HARVEST OF GREATER SAPHENOUS VEIN  11/05/2012  ? Procedure: ENDOVEIN HARVEST OF GREATER SAPHENOUS VEIN;  Surgeon: Kerin Perna, MD;  Location: [facility] OR;  Service: Open Heart Surgery;  Laterality: Right;  ? FEMORAL-FEMORAL BYPASS GRAFT Left 06/09/2018  ? Procedure: REPAIR OF LEFT FEMORAL ARTERY PSEUDO ANEURYSM;  Surgeon: Chuck Hint, MD;  Location: [facility] OR;  Service: Vascular;  Laterality: Left;  ? GROIN DEBRIDEMENT Left 06/11/2018  ? Procedure: EXPLORATION LEFT GROIN FOR BLEEDING;  Surgeon: Chuck Hint, MD;  Location: [facility] OR;  Service: Vascular;  Laterality: Left;  ? IABP INSERTION N/A 05/21/2018  ? Procedure: IABP INSERTION;  Surgeon: Corky Crafts, MD;  Location: [facility] INVASIVE CV LAB;  Service: Cardiovascular;  Laterality: N/A;  ? INTRAOPERATIVE TRANSESOPHAGEAL ECHOCARDIOGRAM  11/05/2012  ? Procedure: INTRAOPERATIVE TRANSESOPHAGEAL ECHOCARDIOGRAM;  Surgeon: Kerin Perna, MD;  Location: [facility] OR;  Service: Open Heart Surgery;  Laterality: N/A;  ? INTRAOPERATIVE TRANSESOPHAGEAL ECHOCARDIOGRAM N/A 11/21/2012  ? Procedure: INTRAOPERATIVE TRANSESOPHAGEAL ECHOCARDIOGRAM;  Surgeon: Kerin Perna, MD;  Location: [facility] OR;  Service: Open Heart Surgery;  Laterality: N/A;  ? LEFT HEART CATHETERIZATION WITH CORONARY ANGIOGRAM N/A 11/04/2012  ? Procedure:  LEFT HEART CATHETERIZATION WITH CORONARY ANGIOGRAM;  Surgeon: Chrystie Nose, MD;  Location: [facility] CATH LAB;  Service: Cardiovascular;  Laterality: N/A;  ? MEDIASTINOSCOPY  08/2005  ? bronchoscopy & mediastinoscopy for mediastinal adenopathy (Dr. Donata Clay)   ? NM MYOCAR PERF WALL MOTION  01/2012  ? lexiscan; small, fixed apical septal breast attenuation artifact & inferolateral bowel attenuation, no reversible ischemia, post-stress EF 64%, low risk scan   ? PERICARDIAL WINDOW N/A 11/21/2012  ? Procedure: PERICARDIAL WINDOW;  Surgeon: Kerin Perna, MD;  Location: [facility] OR;  Service: Thoracic;  Laterality: N/A;  ? REPLACEMENT ASCENDING AORTA  11/05/2012  ? Procedure: REPLACEMENT ASCENDING AORTA;  Surgeon: Kerin Perna, MD;  Location: [facility] OR;  Service: Open Heart Surgery;  Laterality: N/A;  ? STERNAL WIRES REMOVAL N/A 05/26/2019  ? Procedure: STERNAL WIRE REMOVAL;  Surgeon: Kerin Perna, MD;  Location: [facility] OR;  Service: Thoracic;  Laterality: N/A;  ? SUBCLAVIAN ANGIOGRAM  05/10/2000  ? left subclavian arteriogram; patent graft, mild stenosis (Dr. Amada Kingfisher)   ? SUBCLAVIAN BYPASS GRAFT  05/16/1999  ? left subclavian bypass with 7mm Darcon graft (Dr. Amada Kingfisher)  ?  TRANSTHORACIC ECHOCARDIOGRAM  11/20/2012  ? EF 65-70%, mild conc LVH, grade 2 diastolic dysfunction, mech AV, mild MR  ? ? ?OB History   ?No obstetric history on file. ?  ? ? ? ?Home Medications   ? ?Prior to Admission medications   ?Medication Sig Start Date End Date Taking? Authorizing Provider  ?ferrous sulfate 325 (65 FE) MG tablet 1 tablet 10/13/21  Yes [provider]  ?gabapentin (NEURONTIN) 100 MG capsule 1 capsule 10/12/21  Yes [provider]  ?moxifloxacin (VIGAMOX) 0.5 % ophthalmic solution Place 1 drop into the right eye 3 (three) times daily for 7 days. 12/23/21 12/30/21 Yes Trevor Iha, FNP  ?traZODone (DESYREL) 50 MG tablet 1 tablet at bedtime. 10/12/21  Yes [provider]  ?acetaminophen (TYLENOL) 500 MG tablet Take 500 mg  by mouth at bedtime as needed (pain).     [provider]  ?aspirin (ASPIRIN 81) 81 MG chewable tablet 1 tablet    [provider]  ?atorvastatin (LIPITOR) 80 MG tablet TAKE 1 TABL

## 2021-12-23 NOTE — ED Triage Notes (Signed)
Pt here today for redness of RT eye that started yesterday. Visine prn. Pain 2/10 No itching or drainage.  ?

## 2022-03-06 ENCOUNTER — Telehealth: Payer: Self-pay

## 2022-03-06 NOTE — Telephone Encounter (Signed)
Lpmtcb and schedule INR check 

## 2022-03-20 ENCOUNTER — Telehealth: Payer: Self-pay

## 2022-03-20 NOTE — Telephone Encounter (Signed)
I called patient to schedule INR, but found out that PCP is monitoring Warfarin.  She will call us when they are through.

## 2022-05-09 ENCOUNTER — Telehealth: Payer: Self-pay

## 2022-05-09 NOTE — Telephone Encounter (Signed)
Called and spoke with pt's son, Georgiann Mohs. Pt's INR/Warfarin is now managed by Dr Clelia Croft, PCP office. Jeralyn Bennett stated Mrs Lariccia typically has INR checked once a week. I also made him aware that Mrs. Fulco is overdue to see her cardiologist. Transferred to scheduling to schedule the next available appt.   Called PCP office to confirm INR/Warfarin is managed at their office.

## 2022-05-09 NOTE — Telephone Encounter (Signed)
PCP called and stated they are managing the pt's INR. They are giving her a POC machine and will continue managing. Will d/c anticoagulation track at this time.

## 2022-07-03 ENCOUNTER — Ambulatory Visit (HOSPITAL_COMMUNITY)
Admission: RE | Admit: 2022-07-03 | Discharge: 2022-07-03 | Disposition: A | Discharge status: Home / Self Care | Payer: Medicare Other | Source: Ambulatory Visit | Attending: Cardiovascular Disease | Admitting: Cardiovascular Disease

## 2022-07-03 DIAGNOSIS — Z9889 Other specified postprocedural states: Secondary | ICD-10-CM | POA: Insufficient documentation

## 2022-07-03 DIAGNOSIS — Z95828 Presence of other vascular implants and grafts: Secondary | ICD-10-CM | POA: Diagnosis not present

## 2022-07-18 ENCOUNTER — Encounter: Payer: Self-pay | Admitting: Internal Medicine

## 2022-07-18 ENCOUNTER — Ambulatory Visit: Payer: Medicare Other | Attending: Internal Medicine | Admitting: Internal Medicine

## 2022-07-18 VITALS — BP 136/72 | HR 68 | Ht 67.0 in | Wt 192.0 lb

## 2022-07-18 DIAGNOSIS — I2510 Atherosclerotic heart disease of native coronary artery without angina pectoris: Secondary | ICD-10-CM | POA: Diagnosis not present

## 2022-07-18 DIAGNOSIS — Z951 Presence of aortocoronary bypass graft: Secondary | ICD-10-CM | POA: Insufficient documentation

## 2022-07-18 DIAGNOSIS — Z952 Presence of prosthetic heart valve: Secondary | ICD-10-CM | POA: Insufficient documentation

## 2022-07-18 DIAGNOSIS — I6523 Occlusion and stenosis of bilateral carotid arteries: Secondary | ICD-10-CM | POA: Insufficient documentation

## 2022-07-18 NOTE — Patient Instructions (Signed)
Medication Instructions:  Your physician recommends that you continue on your current medications as directed. Please refer to the Current Medication list given to you today.  *If you need a refill on your cardiac medications before your next appointment, please call your pharmacy*  Follow-Up: At [facility], you and your health needs are our priority.  As part of our continuing mission to provide you with exceptional heart care, we have created designated Provider Care Teams.  These Care Teams include your primary Cardiologist (physician) and Advanced Practice Providers (APPs -  Physician Assistants and Nurse Practitioners) who all work together to provide you with the care you need, when you need it.  We recommend signing up for the patient portal called "MyChart".  Sign up information is provided on this After Visit Summary.  MyChart is used to connect with patients for Virtual Visits (Telemedicine).  Patients are able to view lab/test results, encounter notes, upcoming appointments, etc.  Non-urgent messages can be sent to your provider as well.   To learn more about what you can do with MyChart, go to [email or web address].    Your next appointment:   12 month(s)  The format for your next appointment:   In Person  Provider:   Pixie Casino, MD  or

## 2022-07-18 NOTE — Progress Notes (Signed)
OFFICE NOTE  Chief Complaint:  Follow-up  Primary Care Physician: Cleatis Polka., MD  HPI:  [name] is a very pleasant 71 year old African American female with a history of St. Jude AVR placed in 2000 for aortic insufficiency. 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 She also has a history of sarcoidosis, dyslipidemia and contrast allergy, which apparently gave her hives. In April 2013, she saw Dr. Clarene Duke with complaints of chest pain, for which he then did a Myoview on her, which was normal. She was last seen by Corine Shelter on October 21, 2012. At that time she was complaining of having chest pressure and shortness of breath. She was set up for a diagnostic heart catheterization. This was completed by Dr. Rennis Golden, which revealed severe ostial left main stenosis greater than 95%. There was also suggestion of possible ostial RCA disease. The ascending aorta was dilated up to 4.8 cm. At that time, Dr. Donata Clay was asked to consult. Patient was then taken for bypass surgery x2 with a saphenous vein graft to the left anterior descending coronary artery and a saphenous vein graft to the circumflex marginal. 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 Also during that hospitalization, she had development of a pericardial hematoma. Dr. Donata Clay then did a subxiphoid pericardial exposure and evacuation of the pericardial hematoma. She was seen in follow-up in March 2014 and was doing well.  She had lost about 17 lbs during her hospitalization.  She reports returning to work this past summer and is still able to do most activities. Her INR has been followed in our Coumadin clinic and is actually supratherapeutic today at 4. Her dose will be adjusted. She's not had any further laboratory work or followup since this past Spring.  She denies any chest pain with exertion, but does report some discomfort associated with her  anterior scars as well as her sternal wires. She does occasionally get fatigued with work but this improves with rest.  I saw [name] back today in follow-up. She is doing fairly well. She denies any shortness of breath or chest pain. She is annoyed somewhat at her mechanical valve which she says is very loud. She underwent a CT scan this year in March which shows a stable thoracic aneurysm repair at 4.0 cm. 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 In the past she's had left subclavian bypass and will need ultrasound follow-up of that. She's also not had any lab work in the past several months.  [name] returns today for follow-up. Overall she seems to be doing pretty well. She says she gets hot and noted that her blood pressure is somewhat elevated from time to time now. It seems as if she's not currently on any blood pressure medicines. Last time I saw her she was not taking lisinopril and her blood pressure was fairly normal therefore I kept her off the medicine. Previous to that she was taken off of her beta blocker. She really has indication for either one of these are both based on her history of coronary disease, coronary artery bypass grafting, valve repair, and aneurysm. Blood pressure is elevated today. She is due for an INR check. Dopplers including AAA screen as well as ultrasound of her subclavian stenosis were performed in October last year and are stable.  07/03/2016  [name] returns today for  follow-up. Unfortunately she recently had some unstable angina and presented to the hospital with chest pain. She underwent cardiac catheterization after being found to have a NSTEMI with elevated troponin of 0.26. Warfarin was held and she was found to have an EF of 40-45% with moderate hypokinesis of the basal inferior myocardium and grade 2 diastolic dysfunction. Her mechanical heart valve was functioning normally. She then underwent cardiac  catheterization which demonstrated a patent SVG to LAD and SVG to OM1. There was 95% stenosis of an ostial left main, 90% ostial first marginal, her percent ostial RCA and 90% ostial LAD disease. Given the difficulty of the case, intervention was deferred until the next day when Dr. Excell Seltzer performed PCI to the ostium of the SVG to LAD. Selective angiography was not possible after the case due to the stent strut hanging out into the aorta however aortic root angiography showed normal flow down the graft and the patient was noted to be chest pain-free. He recommended aspirin, Plavix and warfarin for 3 months, then discontinuing aspirin and continuing warfarin and Plavix. Today she reports no further chest pain. She initially talked to me about disability however after more discussion she understood that if she were to become formally disabled she could never work again and she did want to continue to work part time in addition to having disability. Based on that she wishes to go back to work and she should be able to go back to work as early as October 9.  01/12/2017  [name] returns today for follow-up. She says she feels a bit stronger and does not feel palpitations is bad at night. EKG today however shows sinus rhythm with a second-degree AV block with a rate of 69. Blood pressures accordingly low 96/50. She is on low-dose metoprolol. She recently stopped aspirin is on Plavix and warfarin. She is due for an INR check today. She had a repeat echocardiogram which shows a normally functioning mechanical aortic valve as well as an improvement in EF up to 60-65%. She does report a little bit of fatigue particularly when walking around [place]. She is not significantly active and inoperative spitting cardigan rehabilitation due to her job. I encouraged her to start to do more physical activity as I do not see any cardiac limitation to her symptoms.   08/03/2017  [name] returns today for follow-up.  Overall she  is feeling well.  She occasionally gets some bruising.  She is currently on Plavix and warfarin.  Her INR was recently assessed.  She had a GI bleeding last fall but this is not recurred.  Her LVEF improved to 60-65% as of April 2018.  She denies any recurrent chest pain.  Her valve has been stable.  We did receive lab work from her PCP this summer which showed a well-controlled lipid profile as well as stable hemoglobin of 13.5.  She tells me that she plans to retire next year.  02/22/2018  Mrs. Oskey was seen today in follow-up.  She continues to do well.  She denies chest pain or worsening shortness of breath.  Her INR today was 2.4 which is slightly low however reasonably well controlled.  She denies any GI bleeding.  LVEF had normalized.  She was planning to retire however it says she is still working.  Blood pressure is well controlled today.  EKG shows sinus rhythm with PVCs and PACs.  She is not symptomatic with this.    11/08/2018  Mrs. Kitchin is seen today in  follow-up.  Unfortunately she has had recent significant hospitalizations.  I saw her in the hospital in August 2019, at which time she presented with chest pain and elevated troponin suggestive of non-STEMI.  She was anticoagulated on warfarin and I recommended admission and heart catheterization however she said that she had some things to take care of and left AGAINST MEDICAL ADVICE.  She then came back to the hospital as promised the next day for direct admission and further cardiac work-up. She did have heart catheterization which showed possible severe in-stent restenosis.  Ultimately she underwent balloon pump supported staged PCI to an ostial left main stenosis which was 95%.  She received a 5.0 x 12 mm Onyx resolute stent.  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  Then it required multiple operations due to recurrent bleeding.  She was admitted several times to the hospital and  then followed up in the office at which time several medication changes were made including taking her off of blood pressure medications.  This was due to dizziness.  Mrs. Micheline Maze says that she does still have some dizziness despite coming off her medicines however blood pressure is higher than it had been previously.  Again she had a history of heart failure in the past however EF had normalized.  I am concerned about redevelopment of cardiomyopathy.  Today she is asymptomatic.  She says she has good energy level.  She has not fatigue.  She denies any chest pains.  INR today was therapeutic.  10/31/2019  Mrs. Bissette returns today for follow-up.  Finally she seems to be doing well.  She is asymptomatic and denies chest pain or worsening shortness of breath.  Is now been 2 years since her last echo but she had a normally functioning mechanical valve.  Her INR is are stable.  She is excited that she will be moving to a townhouse next week.  12/20/2020  Mrs. Roberson is seen today for annual follow-up.  She denies any chest pain or worsening shortness of breath.  Her last echo showed stable valve gradient and normally functioning mechanical aortic valve.  Her warfarin levels have been stable.  Blood pressure is high today.  She says that she was upset about ongoing issues in her house.  She had told me the last time I saw her that she was forced to leave her house because of some people that were bugging her and getting into her house.  She says now that they followed her to her new house.  They have a master key and are able to get in her house and come by daily.  They are apparently spying on her and messing with her electrical outlets.  She says she spends hours every day trying to cover her electrical outlets so that they cannot get to her.  She also says that she hears voices throughout the day.  She does live alone.  She still works.  She says that her son think she is paranoid but she denies that.  He is  apparently a history of mental health disorder in her mother.  PMHx:  Past Medical History:  Diagnosis Date   Ascending aortic aneurysm (HCC)    a.  redo replacement of fusiform aneurysm of ascending aorta (complicated by pericardial hematoma s/p evacuation) in 2014.   Contrast media allergy    Coronary artery disease    a. s/p CABG x2 in 2014. b. s/p DES to SVG-LAD in  2017.   H/O mechanical aortic valve replacement    Hyperlipidemia    Hypertension    Ischemic cardiomyopathy    a. EF 40-45% in 2017 at time of NSTEMI, improved to normal in 2018.   Non-STEMI (non-ST elevated myocardial infarction) (HCC)    Premature atrial contractions    PVC's (premature ventricular contractions)    RA (rheumatoid arthritis) (HCC)    S/P AVR (aortic valve replacement) 08/1999   St. 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 Donata Clay) - complicated by pericardial hematoma --> subxiphoid pericardial exposure and evacuation b. Cath 06/2016: DES to SVG-LAD ostium   Sarcoid    Second degree AV block    a. ? possibly seen in 12/2016 clinic visit - EKG not truly suggestive   Severe anemia    Subclavian artery stenosis (HCC) 06/2000   a. s/p left subclavian bypass.    Past Surgical History:  Procedure Laterality Date   AORTIC VALVE REPLACEMENT  08/23/1999   70mm St. Jude, Bentall procedure, root replacemtn    BYPASS GRAFT ANGIOGRAPHY  05/20/2018   Procedure: BYPASS GRAFT ANGIOGRAPHY;  Surgeon: Corky Crafts, MD;  Location: [facility] INVASIVE CV LAB;  Service: Cardiovascular;;   CARDIAC CATHETERIZATION  08/1999   normal L main, LAD & first diagonal are normal, normal Cfx, RCA with mild ostial narrowing, normal LV systolic function, severe AI, mild MR, normal PAP (Dr. Langston Reusing)    CARDIAC CATHETERIZATION N/A 06/22/2016   Procedure: Left Heart Cath and Cors/Grafts Angiography;   Surgeon: Marykay Lex, MD;  Location: [facility] INVASIVE CV LAB;  Service: Cardiovascular;  Laterality: N/A;   CARDIAC CATHETERIZATION N/A 06/23/2016   Procedure: Left Heart Cath and Coronary Angiography;  Surgeon: Tonny Bollman, MD;  Location: [facility] INVASIVE CV LAB;  Service: Cardiovascular;  Laterality: N/A;   CARDIAC CATHETERIZATION N/A 06/23/2016   Procedure: Coronary Stent Intervention;  Surgeon: Tonny Bollman, MD;  Location: [facility] INVASIVE CV LAB;  Service: Cardiovascular;  Laterality: N/A;   CORONARY ARTERY BYPASS GRAFT  11/05/2012   Procedure: CORONARY ARTERY BYPASS GRAFTING (CABG);  Surgeon: Kerin Perna, MD;  Location: [facility] OR;  Service: Open Heart Surgery;  Laterality: N/A;  cannulate right subclavian   CORONARY STENT INTERVENTION N/A 05/21/2018   Procedure: CORONARY STENT INTERVENTION;  Surgeon: Corky Crafts, MD;  Location: [facility] INVASIVE CV LAB;  Service: Cardiovascular;  Laterality: N/A;   ENDOVEIN HARVEST OF GREATER SAPHENOUS VEIN  11/05/2012   Procedure: ENDOVEIN HARVEST OF GREATER SAPHENOUS VEIN;  Surgeon: Kerin Perna, MD;  Location: [facility] OR;  Service: Open Heart Surgery;  Laterality: Right;   FEMORAL-FEMORAL BYPASS GRAFT Left 06/09/2018   Procedure: REPAIR OF LEFT FEMORAL ARTERY PSEUDO ANEURYSM;  Surgeon: Chuck Hint, MD;  Location: [facility] OR;  Service: Vascular;  Laterality: Left;   GROIN DEBRIDEMENT Left 06/11/2018   Procedure: EXPLORATION LEFT GROIN FOR BLEEDING;  Surgeon: Chuck Hint, MD;  Location: [facility] OR;  Service: Vascular;  Laterality: Left;   IABP INSERTION N/A 05/21/2018   Procedure: IABP INSERTION;  Surgeon: Corky Crafts, MD;  Location: [facility] INVASIVE CV LAB;  Service: Cardiovascular;  Laterality: N/A;   INTRAOPERATIVE TRANSESOPHAGEAL ECHOCARDIOGRAM  11/05/2012   Procedure: INTRAOPERATIVE TRANSESOPHAGEAL ECHOCARDIOGRAM;  Surgeon: Kerin Perna, MD;  Location: [facility] OR;  Service: Open Heart Surgery;  Laterality: N/A;   INTRAOPERATIVE TRANSESOPHAGEAL  ECHOCARDIOGRAM  N/A 11/21/2012   Procedure: INTRAOPERATIVE TRANSESOPHAGEAL ECHOCARDIOGRAM;  Surgeon: Kerin Perna, MD;  Location: [facility] OR;  Service: Open Heart Surgery;  Laterality: N/A;   LEFT HEART CATHETERIZATION WITH CORONARY ANGIOGRAM N/A 11/04/2012   Procedure: LEFT HEART CATHETERIZATION WITH CORONARY ANGIOGRAM;  Surgeon: Chrystie Nose, MD;  Location: [facility] CATH LAB;  Service: Cardiovascular;  Laterality: N/A;   MEDIASTINOSCOPY  08/2005   bronchoscopy & mediastinoscopy for mediastinal adenopathy (Dr. Donata Clay)    NM MYOCAR PERF WALL MOTION  01/2012   lexiscan; small, fixed apical septal breast attenuation artifact & inferolateral bowel attenuation, no reversible ischemia, post-stress EF 64%, low risk scan    PERICARDIAL WINDOW N/A 11/21/2012   Procedure: PERICARDIAL WINDOW;  Surgeon: Kerin Perna, MD;  Location: [facility] OR;  Service: Thoracic;  Laterality: N/A;   REPLACEMENT ASCENDING AORTA  11/05/2012   Procedure: REPLACEMENT ASCENDING AORTA;  Surgeon: Kerin Perna, MD;  Location: [facility] OR;  Service: Open Heart Surgery;  Laterality: N/A;   STERNAL WIRES REMOVAL N/A 05/26/2019   Procedure: STERNAL WIRE REMOVAL;  Surgeon: Donata Clay, Theron Arista, MD;  Location: [facility] OR;  Service: Thoracic;  Laterality: N/A;   SUBCLAVIAN ANGIOGRAM  05/10/2000   left subclavian arteriogram; patent graft, mild stenosis (Dr. Amada Kingfisher)    SUBCLAVIAN BYPASS GRAFT  05/16/1999   left subclavian bypass with 22mm Darcon graft (Dr. Amada Kingfisher)   TRANSTHORACIC ECHOCARDIOGRAM  11/20/2012   EF 65-70%, mild conc LVH, grade 2 diastolic dysfunction, mech AV, mild MR    FAMHx:  Family History  Problem Relation Age of Onset   Hypertension Mother    Colon cancer Neg Hx    Esophageal cancer Neg Hx    Pancreatic cancer Neg Hx    Stomach cancer Neg Hx    Liver disease Neg Hx     SOCHx:   reports that she has never smoked. She has never used smokeless tobacco. She reports that she does not drink alcohol and does not use drugs.  ALLERGIES:   Allergies  Allergen Reactions   Contrast Media [Iodinated Contrast Media] Hives, Itching and Swelling    ROS: Pertinent items noted in HPI and remainder of comprehensive ROS otherwise negative.  HOME MEDS: Current Outpatient Medications  Medication Sig Dispense Refill   acetaminophen (TYLENOL) 500 MG tablet Take 500 mg by mouth at bedtime as needed (pain).      aspirin (ASPIRIN 81) 81 MG chewable tablet 1 tablet     atorvastatin (LIPITOR) 80 MG tablet TAKE 1 TABLET BY MOUTH DAILY AT 6 PM. 90 tablet 3   Calcium Carb-Cholecalciferol (CALCIUM 600+D3 PO) Take 1 tablet by mouth daily.      cyclobenzaprine (FLEXERIL) 10 MG tablet Take 10 mg by mouth 2 (two) times daily as needed.     ferrous sulfate 325 (65 FE) MG tablet 1 tablet     furosemide (LASIX) 40 MG tablet Take 40 mg by mouth daily.     gabapentin (NEURONTIN) 100 MG capsule 1 capsule     lamoTRIgine (LAMICTAL) 25 MG tablet Take 50 mg by mouth 2 (two) times daily.     melatonin 3 MG TABS tablet 1 (one) time each day at the same time.     Multiple Vitamin (MULTIVITAMIN WITH MINERALS) TABS tablet Take 1 tablet by mouth daily.     nitroGLYCERIN (NITROSTAT) 0.4 MG SL tablet PLACE 1 TABLET (0.4 MG TOTAL) UNDER THE TONGUE EVERY 5 (FIVE) MINUTES AS NEEDED FOR CHEST PAIN. 25 tablet 6   ondansetron (  ZOFRAN-ODT) 4 MG disintegrating tablet 1 tablet on the tongue and allow to dissolve     pantoprazole (PROTONIX) 40 MG tablet TAKE 1 TABLET EVERY DAY 15 tablet 0   primidone (MYSOLINE) 50 MG tablet 1 tablet     senna-docusate (SENOKOT-S) 8.6-50 MG tablet Take by mouth.     SUMAtriptan (IMITREX) 50 MG tablet 1 dose at onset of headache in each nostril may repeat dose one time after 2 hours as needed     traZODone (DESYREL) 50 MG tablet 1 tablet at bedtime.     venlafaxine XR (EFFEXOR-XR) 150 MG 24 hr capsule Take 150 mg by mouth daily.     warfarin (COUMADIN) 5 MG tablet TAKE 1/2 TO 1 TABLET BY MOUTH DAILY OR AS DIRECTED BY COUMADIN CLINIC 90  tablet 0   No current facility-administered medications for this visit.    LABS/IMAGING: No results found for this or any previous visit (from the past 48 hour(s)). No results found.  VITALS: BP 136/72   Pulse 68   Ht 5\' 7"  (1.702 m)   Wt 192 lb (87.1 kg)   SpO2 99%   BMI 30.07 kg/m   EXAM: General appearance: alert and no distress Neck: no carotid bruit and no JVD Lungs: clear to auscultation bilaterally Heart: regular rate and rhythm and sharp mechanical valve sounds Abdomen: soft, non-tender; bowel sounds normal; no masses,  no organomegaly Extremities: extremities normal, atraumatic, no cyanosis or edema Pulses: 2+ and symmetric Skin: Skin color, texture, turgor normal. No rashes or lesions Neurologic: Grossly normal Psych: Pleasant, normal  EKG: Deferred  ASSESSMENT: Paranoia/delusions-possible paranoid schizophrenia Coronary artery disease status post two-vessel CABG in 11/2012 (LIMA to LAD, SVG to circumflex). 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   Mrs. Eisenhart essentially dominated the conversation today about issues with intrusive voices and people coming in and out of her apartment as well as numerous phone calls to the authorities about being spied upon and how many hours she spends covering up her electrical outlets so that "they" cannot get to her.  This is very concerning about a paranoid delusion.  I suspect she could have a paranoid  schizophrenia.  We discussed further about trying to find some help with her concerns and she did seem agreeable to talk with somebody about this further.  I would like to refer her to behavioral health.  I hope she follows through with the referral.  No changes to her medicines otherwise.  Follow-up with me annually or sooner as necessary.  Pixie Casino, MD, [place], [place] Director of the [facility] Diplomate of the American Board of Clinical Lipidology Attending Cardiologist  Direct Dial: [phone]  Fax: [phone]  Website:  [email or web address] Dannya Pitkin 07/18/2022, 9:25 AM

## 2022-08-11 ENCOUNTER — Telehealth: Payer: Self-pay | Admitting: Internal Medicine

## 2022-08-11 NOTE — Telephone Encounter (Signed)
   Pre-operative Risk Assessment    Patient Name: [name]  DOB: 1950/10/05 MRN: [id number]      Request for Surgical Clearance    Procedure:   MRI  Date of Surgery:  Clearance TBD                                 Surgeon:  Dr. Linus Salmons Surgeon's Group or Practice Name:  [facility]  Phone number:  [phone] Fax number:  [phone]   Type of Clearance Requested:   - Medical  - Pharmacy:  Hold        Type of Anesthesia:  None    Additional requests/questions:    SignedFilomena Jungling   08/11/2022, 1:36 PM

## 2022-08-11 NOTE — Telephone Encounter (Signed)
Preop team, please call [facility] for additional details.  We do not typically receive requests for clearance for MRI.  Also need to clarify pharmacy hold request  Thank you, Marcelino Duster

## 2022-08-14 NOTE — Telephone Encounter (Signed)
Judeth Cornfield with [facility] called back and states the what they are concerned with in regard to the MRI, is that the pt has AVR and wants to make sure due the valve replacement that she would be safe to have an MRI. I assured the requesting to be sure I will run this past the pre op provider. We will fax notes back with any recommendations if any. Judeth Cornfield thanked me for the help.

## 2022-08-14 NOTE — Telephone Encounter (Signed)
I have tried x 2 to get through to requesting office with no success. I will fax a note to their office asking for clarification. See notes from Eligha Bridegroom, NP as clearance not generally requested for MRI.

## 2022-08-14 NOTE — Telephone Encounter (Signed)
Judeth Cornfield calling back wanting to know if mechanical valve is capable with MRI machine. Call transferred to [facility]

## 2022-08-16 NOTE — Telephone Encounter (Signed)
   Patient Name: [name]  DOB: 1951-01-08 MRN: [id number]  Primary Cardiologist: Chrystie Nose, MD  Chart reviewed as part of pre-operative protocol coverage. Pre-op clearance already addressed by colleagues in earlier phone notes. To summarize recommendations:  - Yes .Marland Kitchen To my knowledge all St. Jude valves are MRI compatible.  -Dr. Rennis Golden  Will route this bundled recommendation to requesting provider via Epic fax function and remove from pre-op pool. Please call with questions.  Sharlene Dory, PA-C 08/16/2022, 8:50 AM

## 2022-08-18 ENCOUNTER — Other Ambulatory Visit: Payer: Self-pay | Admitting: Internal Medicine

## 2022-08-18 DIAGNOSIS — I6523 Occlusion and stenosis of bilateral carotid arteries: Secondary | ICD-10-CM

## 2023-06-28 ENCOUNTER — Other Ambulatory Visit: Payer: Self-pay | Admitting: Internal Medicine

## 2023-06-28 DIAGNOSIS — Z1231 Encounter for screening mammogram for malignant neoplasm of breast: Secondary | ICD-10-CM

## 2023-07-20 ENCOUNTER — Telehealth: Payer: Self-pay | Admitting: Internal Medicine

## 2023-07-20 DIAGNOSIS — I2510 Atherosclerotic heart disease of native coronary artery without angina pectoris: Secondary | ICD-10-CM

## 2023-07-20 DIAGNOSIS — I6523 Occlusion and stenosis of bilateral carotid arteries: Secondary | ICD-10-CM

## 2023-07-20 NOTE — Telephone Encounter (Signed)
Patient called to have appt for carotid rescheduled due to needing it done at the end of January. Unable to reschedule due to order expiring in November.

## 2023-07-20 NOTE — Telephone Encounter (Signed)
New order placed for Carotid US. Patient notified.

## 2023-07-24 ENCOUNTER — Ambulatory Visit (HOSPITAL_COMMUNITY): Admission: RE | Admit: 2023-07-24 | Payer: Medicare Other | Source: Ambulatory Visit

## 2023-10-04 ENCOUNTER — Other Ambulatory Visit (HOSPITAL_COMMUNITY): Payer: Self-pay

## 2023-10-05 ENCOUNTER — Other Ambulatory Visit (HOSPITAL_COMMUNITY): Payer: Self-pay

## 2023-10-08 ENCOUNTER — Other Ambulatory Visit (HOSPITAL_COMMUNITY): Payer: Self-pay

## 2023-10-09 ENCOUNTER — Other Ambulatory Visit (HOSPITAL_COMMUNITY): Payer: Self-pay

## 2023-10-09 MED ORDER — SUMATRIPTAN SUCCINATE 50 MG PO TABS: ORAL_TABLET | ORAL | 6 refills | Status: AC

## 2023-10-09 MED ORDER — ATORVASTATIN CALCIUM 40 MG PO TABS
40.0000 mg | ORAL_TABLET | Freq: Every day | ORAL | 3 refills | Status: DC
  Filled 2023-10-09: qty 90, 90d supply, fill #0
  Filled 2023-12-31: qty 90, 90d supply, fill #1
  Filled 2024-03-31: qty 90, 90d supply, fill #2
  Filled 2024-06-29: qty 90, 90d supply, fill #3

## 2023-10-09 MED ORDER — PROPRANOLOL HCL ER 60 MG PO CP24
60.0000 mg | ORAL_CAPSULE | Freq: Every day | ORAL | 3 refills | Status: AC
  Filled 2024-01-01: qty 90, 90d supply, fill #0
  Filled 2024-03-31: qty 90, 90d supply, fill #1

## 2023-10-09 MED ORDER — WARFARIN SODIUM 4 MG PO TABS
ORAL_TABLET | ORAL | 3 refills | Status: DC
  Filled 2023-10-09: qty 90, 90d supply, fill #0
  Filled 2023-12-31: qty 90, 90d supply, fill #1
  Filled 2024-03-31: qty 90, 90d supply, fill #2
  Filled 2024-06-29: qty 90, 90d supply, fill #3

## 2023-10-09 MED ORDER — ASPIRIN 81 MG PO TBEC
81.0000 mg | DELAYED_RELEASE_TABLET | Freq: Every day | ORAL | 3 refills | Status: DC
  Filled 2023-12-31: qty 90, 90d supply, fill #0
  Filled 2024-03-24: qty 90, 90d supply, fill #1

## 2023-10-09 MED ORDER — PRIMIDONE 50 MG PO TABS
50.0000 mg | ORAL_TABLET | Freq: Every day | ORAL | 3 refills | Status: AC
  Filled 2023-10-09: qty 90, 90d supply, fill #0
  Filled 2023-12-31: qty 90, 90d supply, fill #1
  Filled 2024-03-31: qty 90, 90d supply, fill #2
  Filled 2024-06-29: qty 90, 90d supply, fill #3

## 2023-10-09 MED ORDER — LAMOTRIGINE 25 MG PO TABS
25.0000 mg | ORAL_TABLET | Freq: Two times a day (BID) | ORAL | 3 refills | Status: AC
  Filled 2023-12-31: qty 180, 90d supply, fill #0
  Filled 2024-03-24: qty 180, 90d supply, fill #1

## 2023-10-09 MED ORDER — VENLAFAXINE HCL 37.5 MG PO TABS: 37.5000 mg | ORAL_TABLET | Freq: Every day | ORAL | 3 refills | Status: AC

## 2023-10-09 MED ORDER — GABAPENTIN 100 MG PO CAPS
100.0000 mg | ORAL_CAPSULE | Freq: Three times a day (TID) | ORAL | 3 refills | Status: DC
  Filled 2023-10-09: qty 90, 30d supply, fill #0
  Filled 2023-11-06 – 2023-11-29 (×2): qty 90, 30d supply, fill #1
  Filled 2023-12-31: qty 90, 30d supply, fill #2
  Filled 2024-01-24: qty 90, 30d supply, fill #3

## 2023-10-09 MED ORDER — TRAZODONE HCL 50 MG PO TABS
50.0000 mg | ORAL_TABLET | Freq: Every day | ORAL | 3 refills | Status: DC
  Filled 2023-10-09: qty 90, 90d supply, fill #0
  Filled 2024-01-01: qty 90, 90d supply, fill #1
  Filled 2024-03-31: qty 90, 90d supply, fill #2
  Filled 2024-06-29: qty 90, 90d supply, fill #3

## 2023-10-09 MED ORDER — NITROGLYCERIN 0.4 MG SL SUBL
SUBLINGUAL_TABLET | SUBLINGUAL | 3 refills | Status: AC
  Filled 2023-10-09: qty 25, 30d supply, fill #0
  Filled 2023-11-06 – 2023-12-31 (×2): qty 25, 30d supply, fill #1

## 2023-10-09 MED ORDER — ONDANSETRON 4 MG PO TBDP
4.0000 mg | ORAL_TABLET | Freq: Every day | ORAL | 3 refills | Status: AC
  Filled 2023-10-09: qty 30, 30d supply, fill #0
  Filled 2023-11-06 – 2023-11-29 (×2): qty 30, 30d supply, fill #1

## 2023-10-09 MED ORDER — PANTOPRAZOLE SODIUM 40 MG PO TBEC
40.0000 mg | DELAYED_RELEASE_TABLET | Freq: Every day | ORAL | 3 refills | Status: DC
  Filled 2023-10-09: qty 90, 90d supply, fill #0
  Filled 2023-12-31: qty 90, 90d supply, fill #1
  Filled 2024-03-31: qty 90, 90d supply, fill #2
  Filled 2024-06-29: qty 90, 90d supply, fill #3

## 2023-10-09 MED ORDER — PROPRANOLOL HCL ER 60 MG PO CP24
60.0000 mg | ORAL_CAPSULE | Freq: Every day | ORAL | 3 refills | Status: DC
  Filled 2023-10-09: qty 90, 90d supply, fill #0
  Filled 2024-06-29: qty 90, 90d supply, fill #1
  Filled 2024-09-30: qty 90, 90d supply, fill #2

## 2023-10-09 MED ORDER — NITROGLYCERIN 0.4 MG SL SUBL: 0.4000 mg | SUBLINGUAL_TABLET | SUBLINGUAL | 5 refills | Status: DC | PRN

## 2023-10-09 MED ORDER — SUMATRIPTAN SUCCINATE 50 MG PO TABS
ORAL_TABLET | ORAL | 3 refills | Status: AC
  Filled 2023-10-09: qty 27, 90d supply, fill #0
  Filled 2023-12-31: qty 27, 90d supply, fill #1

## 2023-10-09 MED ORDER — VENLAFAXINE HCL 37.5 MG PO TABS
37.5000 mg | ORAL_TABLET | Freq: Every day | ORAL | 3 refills | Status: DC
  Filled 2023-10-09: qty 90, 90d supply, fill #0
  Filled 2023-12-31: qty 90, 90d supply, fill #1
  Filled 2024-03-31: qty 90, 90d supply, fill #2
  Filled 2024-06-29: qty 90, 90d supply, fill #3

## 2023-10-09 MED ORDER — FERROUS SULFATE 325 (65 FE) MG PO TABS
325.0000 mg | ORAL_TABLET | Freq: Every day | ORAL | 3 refills | Status: DC
  Filled 2023-10-09: qty 90, 90d supply, fill #0
  Filled 2023-12-31: qty 90, 90d supply, fill #1
  Filled 2024-03-31: qty 90, 90d supply, fill #2
  Filled 2024-06-29: qty 90, 90d supply, fill #3

## 2023-10-11 ENCOUNTER — Other Ambulatory Visit (HOSPITAL_COMMUNITY): Payer: Self-pay

## 2023-10-29 ENCOUNTER — Ambulatory Visit: Payer: Medicare Other

## 2023-11-01 ENCOUNTER — Ambulatory Visit (HOSPITAL_COMMUNITY): Admission: RE | Admit: 2023-11-01 | Payer: Self-pay | Source: Ambulatory Visit

## 2023-11-06 ENCOUNTER — Other Ambulatory Visit (HOSPITAL_COMMUNITY): Payer: Self-pay

## 2023-11-29 ENCOUNTER — Other Ambulatory Visit (HOSPITAL_COMMUNITY): Payer: Self-pay

## 2023-11-30 ENCOUNTER — Other Ambulatory Visit: Payer: Self-pay

## 2023-12-04 ENCOUNTER — Encounter (HOSPITAL_COMMUNITY): Payer: Self-pay

## 2023-12-05 ENCOUNTER — Ambulatory Visit (HOSPITAL_COMMUNITY)
Admission: RE | Admit: 2023-12-05 | Discharge: 2023-12-05 | Disposition: A | Discharge status: Home / Self Care | Payer: Self-pay | Source: Ambulatory Visit | Attending: Cardiology | Admitting: Cardiology

## 2023-12-05 ENCOUNTER — Ambulatory Visit: Payer: Medicare Other

## 2023-12-05 DIAGNOSIS — I2510 Atherosclerotic heart disease of native coronary artery without angina pectoris: Secondary | ICD-10-CM | POA: Diagnosis not present

## 2023-12-05 DIAGNOSIS — I6523 Occlusion and stenosis of bilateral carotid arteries: Secondary | ICD-10-CM | POA: Diagnosis not present

## 2023-12-10 ENCOUNTER — Encounter: Payer: Self-pay | Admitting: Internal Medicine

## 2023-12-10 ENCOUNTER — Other Ambulatory Visit (HOSPITAL_COMMUNITY): Payer: Self-pay | Admitting: *Deleted

## 2023-12-10 DIAGNOSIS — I6523 Occlusion and stenosis of bilateral carotid arteries: Secondary | ICD-10-CM

## 2023-12-31 ENCOUNTER — Other Ambulatory Visit (HOSPITAL_COMMUNITY): Payer: Self-pay

## 2024-01-01 ENCOUNTER — Other Ambulatory Visit (HOSPITAL_COMMUNITY): Payer: Self-pay

## 2024-01-01 ENCOUNTER — Other Ambulatory Visit: Payer: Self-pay

## 2024-01-01 MED ORDER — NITROGLYCERIN 0.4 MG SL SUBL
0.4000 mg | SUBLINGUAL_TABLET | SUBLINGUAL | 3 refills | Status: AC | PRN
  Filled 2024-01-01: qty 25, 8d supply, fill #0

## 2024-03-24 ENCOUNTER — Other Ambulatory Visit (HOSPITAL_COMMUNITY): Payer: Self-pay

## 2024-03-31 ENCOUNTER — Other Ambulatory Visit: Payer: Self-pay

## 2024-04-25 ENCOUNTER — Other Ambulatory Visit (HOSPITAL_COMMUNITY): Payer: Self-pay

## 2024-04-25 ENCOUNTER — Other Ambulatory Visit: Payer: Self-pay

## 2024-04-25 MED ORDER — GABAPENTIN 100 MG PO CAPS
100.0000 mg | ORAL_CAPSULE | Freq: Three times a day (TID) | ORAL | 3 refills | Status: AC
  Filled 2024-04-25: qty 90, 30d supply, fill #0
  Filled 2024-05-19: qty 90, 30d supply, fill #1
  Filled 2024-06-17: qty 90, 30d supply, fill #2
  Filled 2024-07-17: qty 90, 30d supply, fill #3

## 2024-04-25 MED ORDER — GABAPENTIN 100 MG PO CAPS
100.0000 mg | ORAL_CAPSULE | Freq: Three times a day (TID) | ORAL | 3 refills | Status: AC
  Filled 2024-08-10 – 2024-08-14 (×4): qty 90, 30d supply, fill #0
  Filled 2024-09-08: qty 90, 30d supply, fill #1
  Filled 2024-10-08: qty 90, 30d supply, fill #2
  Filled 2024-11-05: qty 90, 30d supply, fill #3

## 2024-06-17 ENCOUNTER — Other Ambulatory Visit: Payer: Self-pay

## 2024-06-24 ENCOUNTER — Other Ambulatory Visit (HOSPITAL_COMMUNITY): Payer: Self-pay

## 2024-06-24 MED ORDER — LAMOTRIGINE 25 MG PO TABS
25.0000 mg | ORAL_TABLET | Freq: Two times a day (BID) | ORAL | 6 refills | Status: AC
  Filled 2024-06-24: qty 60, 30d supply, fill #0
  Filled 2024-07-22: qty 60, 30d supply, fill #1
  Filled 2024-08-17: qty 60, 30d supply, fill #2
  Filled 2024-09-16: qty 60, 30d supply, fill #3
  Filled 2024-10-13: qty 60, 30d supply, fill #4

## 2024-06-25 ENCOUNTER — Other Ambulatory Visit (HOSPITAL_COMMUNITY): Payer: Self-pay

## 2024-06-25 ENCOUNTER — Other Ambulatory Visit: Payer: Self-pay

## 2024-06-25 ENCOUNTER — Other Ambulatory Visit (HOSPITAL_BASED_OUTPATIENT_CLINIC_OR_DEPARTMENT_OTHER): Payer: Self-pay

## 2024-06-26 ENCOUNTER — Other Ambulatory Visit (HOSPITAL_COMMUNITY): Payer: Self-pay

## 2024-07-09 ENCOUNTER — Other Ambulatory Visit (HOSPITAL_COMMUNITY): Payer: Self-pay

## 2024-08-10 ENCOUNTER — Other Ambulatory Visit (HOSPITAL_COMMUNITY): Payer: Self-pay

## 2024-08-11 ENCOUNTER — Other Ambulatory Visit: Payer: Self-pay

## 2024-08-11 ENCOUNTER — Other Ambulatory Visit (HOSPITAL_COMMUNITY): Payer: Self-pay

## 2024-08-12 ENCOUNTER — Other Ambulatory Visit: Payer: Self-pay

## 2024-08-14 ENCOUNTER — Other Ambulatory Visit: Payer: Self-pay

## 2024-08-14 DIAGNOSIS — H25813 Combined forms of age-related cataract, bilateral: Secondary | ICD-10-CM | POA: Diagnosis not present

## 2024-08-14 DIAGNOSIS — H524 Presbyopia: Secondary | ICD-10-CM | POA: Diagnosis not present

## 2024-08-14 DIAGNOSIS — H5203 Hypermetropia, bilateral: Secondary | ICD-10-CM | POA: Diagnosis not present

## 2024-09-05 ENCOUNTER — Other Ambulatory Visit (HOSPITAL_COMMUNITY): Payer: Self-pay

## 2024-09-08 ENCOUNTER — Other Ambulatory Visit: Payer: Self-pay

## 2024-09-08 ENCOUNTER — Other Ambulatory Visit (HOSPITAL_COMMUNITY): Payer: Self-pay

## 2024-09-08 MED ORDER — ASPIRIN 81 MG PO TBEC
81.0000 mg | DELAYED_RELEASE_TABLET | Freq: Every day | ORAL | 3 refills | Status: AC
  Filled 2024-09-08: qty 90, 90d supply, fill #0

## 2024-09-11 ENCOUNTER — Ambulatory Visit
Admission: EM | Admit: 2024-09-11 | Discharge: 2024-09-11 | Disposition: A | Discharge status: Home / Self Care | Attending: Family Medicine | Admitting: Family Medicine

## 2024-09-11 ENCOUNTER — Encounter: Payer: Self-pay | Admitting: Emergency Medicine

## 2024-09-11 DIAGNOSIS — L03211 Cellulitis of face: Secondary | ICD-10-CM

## 2024-09-11 DIAGNOSIS — K047 Periapical abscess without sinus: Secondary | ICD-10-CM

## 2024-09-11 MED ORDER — CEFTRIAXONE SODIUM 1 G IJ SOLR
1000.0000 mg | Freq: Once | INTRAMUSCULAR | Status: AC
  Administered 2024-09-11: 1000 mg via INTRAMUSCULAR

## 2024-09-11 MED ORDER — HYDROCODONE-ACETAMINOPHEN 5-325 MG PO TABS: 1.0000 | ORAL_TABLET | Freq: Four times a day (QID) | ORAL | 0 refills | Status: AC | PRN

## 2024-09-11 NOTE — ED Triage Notes (Signed)
 Patient c/o right upper toothache that started yesterday.  Patient's INR was 4.6 yesterday spoke w/doctor advised not to take Coumadin  yesterday and a half a pill today.  Patient woke up with the right side of her face swollen and painful.  Does not have a dentist.  Has taken Tylenol  for pain.

## 2024-09-11 NOTE — ED Provider Notes (Signed)
 [facility]    CSN: [id number] Arrival date & time: 09/11/24  0959      History   Chief Complaint Chief Complaint  Patient presents with   Oral Swelling    HPI [name] is a 73 y.o. female.   Patient does not get regular dental care.  She states she has some fractured and bad teeth.  She states she has been having some pain around the right upper molar that is carious.  Since yesterday she started having increased pain and swelling.  Today she woke up with the right side of her face swollen.  She is here for medical care.  She has not noticed any sweats chills or fever.  No nausea or vomiting. Patient has significant heart disease, heart valve replacement, and is on Coumadin  therapy.  She is on multiple medications.  Her son is here with her.  He expresses concern that her antibiotic not interfere with her Coumadin     Past Medical History:  Diagnosis Date   Ascending aortic aneurysm    a.  redo replacement of fusiform aneurysm of ascending aorta (complicated by pericardial hematoma s/p evacuation) in 2014.   Contrast media allergy    Coronary artery disease    a. s/p CABG x2 in 2014. b. s/p DES to SVG-LAD in 2017.   H/O mechanical aortic valve replacement    Hyperlipidemia    Hypertension    Ischemic cardiomyopathy    a. EF 40-45% in 2017 at time of NSTEMI, improved to normal in 2018.   Non-STEMI (non-ST elevated myocardial infarction) (HCC)    Premature atrial contractions    PVC's (premature ventricular contractions)    RA (rheumatoid arthritis) (HCC)    S/P AVR (aortic valve replacement) 08/1999   St. 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 Fleeta Ochoa) - complicated by pericardial hematoma --> subxiphoid pericardial exposure and evacuation b. Cath 06/2016: DES to SVG-LAD ostium   Sarcoid    Second degree AV block    a. ?  possibly seen in 12/2016 clinic visit - EKG not truly suggestive   Severe anemia    Subclavian artery stenosis 06/2000   a. s/p left subclavian bypass.    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   CAD in native artery 05/17/2018   2nd degree AV block 01/12/2017   DOE (dyspnea on exertion) 01/12/2017   Encounter for long-term (current) use of medications 01/12/2017   Acute blood loss anemia 08/15/2016   Aortic valve disease 07/03/2016   Cardiomyopathy, ischemic 07/03/2016   Hx of CABG    NSTEMI (non-ST elevated myocardial infarction) (HCC) 06/20/2016   Acute pericardial effusion post op without tamponade - S/P subxiphoid pericaridal window 11/21/12 11/21/2012   PVD , known Lt SCA stenosis 11/21/2012   Pleural effusion, RT, post op 11/21/2012   Coronary artery disease involving native coronary artery of native heart without angina pectoris 11/04/2012   S/P AVR (aortic valve replacement) 11/04/2012   Dilated ascending aorta at cath 11/03/12 - S/P root replacement 11/05/12 11/04/2012   Chronic anticoagulation for mechanical AVR 11/04/2012   Contrast media allergy 11/04/2012   Essential hypertension 11/04/2012   Sarcoidosis 11/04/2012    Past Surgical History:  Procedure Laterality Date   AORTIC VALVE  REPLACEMENT  08/23/1999   23mm St. Jude, Bentall procedure, root replacemtn    BYPASS GRAFT ANGIOGRAPHY  05/20/2018   Procedure: BYPASS GRAFT ANGIOGRAPHY;  Surgeon: Dann Candyce RAMAN, MD;  Location: [facility] INVASIVE CV LAB;  Service: Cardiovascular;;   CARDIAC CATHETERIZATION  08/1999   normal L main, LAD & first diagonal are normal, normal Cfx, RCA with mild ostial narrowing, normal LV systolic function, severe AI, mild MR, normal PAP (Dr. RONAL Ly)    CARDIAC CATHETERIZATION N/A 06/22/2016   Procedure: Left Heart Cath and Cors/Grafts Angiography;  Surgeon: Alm LELON Clay, MD;  Location: [facility] INVASIVE CV  LAB;  Service: Cardiovascular;  Laterality: N/A;   CARDIAC CATHETERIZATION N/A 06/23/2016   Procedure: Left Heart Cath and Coronary Angiography;  Surgeon: Ozell Fell, MD;  Location: [facility] INVASIVE CV LAB;  Service: Cardiovascular;  Laterality: N/A;   CARDIAC CATHETERIZATION N/A 06/23/2016   Procedure: Coronary Stent Intervention;  Surgeon: Ozell Fell, MD;  Location: [facility] INVASIVE CV LAB;  Service: Cardiovascular;  Laterality: N/A;   CORONARY ARTERY BYPASS GRAFT  11/05/2012   Procedure: CORONARY ARTERY BYPASS GRAFTING (CABG);  Surgeon: Maude Fleeta Ochoa, MD;  Location: [facility] OR;  Service: Open Heart Surgery;  Laterality: N/A;  cannulate right subclavian   CORONARY STENT INTERVENTION N/A 05/21/2018   Procedure: CORONARY STENT INTERVENTION;  Surgeon: Dann Candyce RAMAN, MD;  Location: [facility] INVASIVE CV LAB;  Service: Cardiovascular;  Laterality: N/A;   ENDOVEIN HARVEST OF GREATER SAPHENOUS VEIN  11/05/2012   Procedure: ENDOVEIN HARVEST OF GREATER SAPHENOUS VEIN;  Surgeon: Maude Fleeta Ochoa, MD;  Location: [facility] OR;  Service: Open Heart Surgery;  Laterality: Right;   FEMORAL-FEMORAL BYPASS GRAFT Left 06/09/2018   Procedure: REPAIR OF LEFT FEMORAL ARTERY PSEUDO ANEURYSM;  Surgeon: Eliza Lonni RAMAN, MD;  Location: [facility] OR;  Service: Vascular;  Laterality: Left;   GROIN DEBRIDEMENT Left 06/11/2018   Procedure: EXPLORATION LEFT GROIN FOR BLEEDING;  Surgeon: Eliza Lonni RAMAN, MD;  Location: [facility] OR;  Service: Vascular;  Laterality: Left;   IABP INSERTION N/A 05/21/2018   Procedure: IABP INSERTION;  Surgeon: Dann Candyce RAMAN, MD;  Location: [facility] INVASIVE CV LAB;  Service: Cardiovascular;  Laterality: N/A;   INTRAOPERATIVE TRANSESOPHAGEAL ECHOCARDIOGRAM  11/05/2012   Procedure: INTRAOPERATIVE TRANSESOPHAGEAL ECHOCARDIOGRAM;  Surgeon: Maude Fleeta Ochoa, MD;  Location: [facility] OR;  Service: Open Heart Surgery;  Laterality: N/A;   INTRAOPERATIVE TRANSESOPHAGEAL ECHOCARDIOGRAM N/A 11/21/2012   Procedure: INTRAOPERATIVE  TRANSESOPHAGEAL ECHOCARDIOGRAM;  Surgeon: Maude Fleeta Ochoa, MD;  Location: [facility] OR;  Service: Open Heart Surgery;  Laterality: N/A;   LEFT HEART CATHETERIZATION WITH CORONARY ANGIOGRAM N/A 11/04/2012   Procedure: LEFT HEART CATHETERIZATION WITH CORONARY ANGIOGRAM;  Surgeon: Vinie KYM Maxcy, MD;  Location: [facility] CATH LAB;  Service: Cardiovascular;  Laterality: N/A;   MEDIASTINOSCOPY  08/2005   bronchoscopy & mediastinoscopy for mediastinal adenopathy (Dr. Fleeta Ochoa)    NM MYOCAR PERF WALL MOTION  01/2012   lexiscan ; small, fixed apical septal breast attenuation artifact & inferolateral bowel attenuation, no reversible ischemia, post-stress EF 64%, low risk scan    PERICARDIAL WINDOW N/A 11/21/2012   Procedure: PERICARDIAL WINDOW;  Surgeon: Maude Fleeta Ochoa, MD;  Location: [facility] OR;  Service: Thoracic;  Laterality: N/A;   REPLACEMENT ASCENDING AORTA  11/05/2012   Procedure: REPLACEMENT ASCENDING AORTA;  Surgeon: Maude Fleeta Ochoa, MD;  Location: [facility] OR;  Service: Open Heart Surgery;  Laterality: N/A;   STERNAL WIRES REMOVAL N/A 05/26/2019   Procedure: STERNAL WIRE REMOVAL;  Surgeon: Fleeta Ochoa Maude, MD;  Location:  [facility] OR;  Service: Thoracic;  Laterality: N/A;   SUBCLAVIAN ANGIOGRAM  05/10/2000   left subclavian arteriogram; patent graft, mild stenosis (Dr. JUDITHANN Hint)    SUBCLAVIAN BYPASS GRAFT  05/16/1999   left subclavian bypass with 7mm Darcon graft (Dr. JUDITHANN Hint)   TRANSTHORACIC ECHOCARDIOGRAM  11/20/2012   EF 65-70%, mild conc LVH, grade 2 diastolic dysfunction, mech AV, mild MR    OB History   No obstetric history on file.      Home Medications    Prior to Admission medications  Medication Sig Start Date End Date Taking? Authorizing Provider  acetaminophen  (TYLENOL ) 500 MG tablet Take 500 mg by mouth at bedtime as needed (pain).    Yes [provider]  amoxicillin-clavulanate (AUGMENTIN) 875-125 MG tablet Take 1 tablet by mouth every 12 (twelve) hours. 09/11/24  Yes Maranda Jamee Jacob, MD   aspirin  (ASPIRIN  81) 81 MG chewable tablet 1 tablet   Yes [provider]  aspirin  EC ([facility] ASPIRIN  LOW DOSE) 81 MG tablet Take 1 tablet (81 mg total) by mouth daily. 09/08/24  Yes   atorvastatin  (LIPITOR ) 40 MG tablet Take 1 tablet (40 mg total) by mouth daily. 10/08/23  Yes   Calcium  Carb-Cholecalciferol  (CALCIUM  600+D3 PO) Take 1 tablet by mouth daily.    Yes [provider]  ferrous sulfate  325 (65 FE) MG tablet 1 tablet 10/13/21  Yes [provider]  ferrous sulfate  325 (65 FE) MG tablet Take 1 tablet (325 mg total) by mouth daily. 10/08/23  Yes   gabapentin  (NEURONTIN ) 100 MG capsule 1 capsule 10/12/21  Yes [provider]  gabapentin  (NEURONTIN ) 100 MG capsule Take 1 capsule (100 mg total) by mouth 3 (three) times daily. 04/25/24  Yes   gabapentin  (NEURONTIN ) 100 MG capsule Take 1 capsule (100 mg total) by mouth 3 (three) times daily. 04/25/24  Yes   HYDROcodone-acetaminophen  (NORCO/VICODIN) 5-325 MG tablet Take 1-2 tablets by mouth every 6 (six) hours as needed. 09/11/24  Yes Maranda Jamee Jacob, MD  lamoTRIgine  (LAMICTAL ) 25 MG tablet Take 50 mg by mouth 2 (two) times daily. 12/06/21  Yes [provider]  lamoTRIgine  (LAMICTAL ) 25 MG tablet Take 1 tablet (25 mg total) by mouth 2 (two) times daily. 06/29/23  Yes   lamoTRIgine  (LAMICTAL ) 25 MG tablet Take 1 tablet (25 mg total) by mouth 2 (two) times daily. 06/24/24  Yes   melatonin 3 MG TABS tablet 1 (one) time each day at the same time. 10/12/21  Yes [provider]  Multiple Vitamin (MULTIVITAMIN WITH MINERALS) TABS tablet Take 1 tablet by mouth daily.   Yes [provider]  nitroGLYCERIN  (NITROSTAT ) 0.4 MG SL tablet PLACE 1 TABLET (0.4 MG TOTAL) UNDER THE TONGUE EVERY 5 (FIVE) MINUTES AS NEEDED FOR CHEST PAIN. 02/13/18  Yes Hilty, Vinie BROCKS, MD  nitroGLYCERIN  (NITROSTAT ) 0.4 MG SL tablet place one tablet Sublingual under tongue as needed for chest pain - repeat dose every 5 minutes as needed.  10/08/23  Yes   nitroGLYCERIN  (NITROSTAT ) 0.4 MG SL tablet Place 1 tablet (0.4 mg total) under the tongue as needed for chest pain-repeat dose every 5 minutes as needed. If no relief after 3 doses call 911. 01/01/24  Yes   ondansetron  (ZOFRAN -ODT) 4 MG disintegrating tablet 1 tablet on the tongue and allow to dissolve   Yes [provider]  ondansetron  (ZOFRAN -ODT) 4 MG disintegrating tablet place 1 tablet on the tongue and allow to dissolve Orally Once a day 10/08/23  Yes  pantoprazole  (PROTONIX ) 40 MG tablet TAKE 1 TABLET EVERY DAY 12/02/21  Yes Hilty, Vinie BROCKS, MD  pantoprazole  (PROTONIX ) 40 MG tablet Take 1 tablet (40 mg total) by mouth daily. 10/08/23  Yes   primidone  (MYSOLINE ) 50 MG tablet 1 tablet   Yes [provider]  primidone  (MYSOLINE ) 50 MG tablet Take 1 tablet (50 mg total) by mouth daily. 10/08/23  Yes   propranolol  ER (INDERAL  LA) 60 MG 24 hr capsule Take 1 capsule (60 mg total) by mouth daily. 10/08/23  Yes   propranolol  ER (INDERAL  LA) 60 MG 24 hr capsule Take 1 capsule (60 mg total) by mouth daily. 06/28/23  Yes   SUMAtriptan  (IMITREX ) 50 MG tablet 1 dose at onset of headache in each nostril may repeat dose one time after 2 hours as needed   Yes [provider]  SUMAtriptan  (IMITREX ) 50 MG tablet Use one spray at onset of headache in each nostril may repeat dose one time after 2 hours as needed 10/08/23  Yes   SUMAtriptan  (IMITREX ) 50 MG tablet Take 1 tablet by mouth at onset of headache. May repeat once after 2 hours as needed 09/25/23  Yes   traZODone  (DESYREL ) 50 MG tablet 1 tablet at bedtime. 10/12/21  Yes [provider]  traZODone  (DESYREL ) 50 MG tablet Take 1 tablet (50 mg total) by mouth at bedtime. 10/08/23  Yes   venlafaxine  (EFFEXOR ) 37.5 MG tablet Take 1 tablet (37.5 mg total) by mouth daily. 10/08/23  Yes   venlafaxine  (EFFEXOR ) 37.5 MG tablet Take 1 tablet (37.5 mg total) by mouth daily. 10/11/22  Yes   venlafaxine  XR (EFFEXOR -XR) 150 MG 24 hr capsule  Take 150 mg by mouth daily. 10/21/21  Yes [provider]  warfarin (COUMADIN ) 4 MG tablet Take 2mg  on Monday's and Friday's, take 4mg  on Wednesday's, Thursdays and Saturdays and take 6mg  on Tuesday's, and Sunday's. orally as directed 90 days 10/08/23  Yes   atorvastatin  (LIPITOR ) 80 MG tablet TAKE 1 TABLET BY MOUTH DAILY AT 6 PM. 07/04/21   Hilty, Vinie BROCKS, MD  warfarin (COUMADIN ) 5 MG tablet TAKE 1/2 TO 1 TABLET BY MOUTH DAILY OR AS DIRECTED BY COUMADIN  CLINIC 10/04/21   Hilty, Vinie BROCKS, MD    Family History Family History  Problem Relation Age of Onset   Hypertension Mother    Colon cancer Neg Hx    Esophageal cancer Neg Hx    Pancreatic cancer Neg Hx    Stomach cancer Neg Hx    Liver disease Neg Hx     Social History Social History[1]   Allergies   Contrast media [iodinated contrast media]   Review of Systems Review of Systems  See HPI Physical Exam Triage Vital Signs ED Triage Vitals  Encounter Vitals Group     BP 09/11/24 1021 (!) 147/75     Girls Systolic BP Percentile --      Girls Diastolic BP Percentile --      Boys Systolic BP Percentile --      Boys Diastolic BP Percentile --      Pulse --      Resp 09/11/24 1021 18     Temp 09/11/24 1021 99.5 F (37.5 C)     Temp Source 09/11/24 1021 Oral     SpO2 09/11/24 1021 93 %     Weight 09/11/24 1016 189 lb (85.7 kg)     Height 09/11/24 1016 5' 7 (1.702 m)     Head Circumference --  Peak Flow --      Pain Score 09/11/24 1016 10     Pain Loc --      Pain Education --      Exclude from Growth Chart --    No data found.  Updated Vital Signs BP (!) 147/75 (BP Location: Right Arm)   Temp 99.5 F (37.5 C) (Oral)   Resp 18   Ht 5' 7 (1.702 m)   Wt 85.7 kg   SpO2 93%   BMI 29.60 kg/m       Physical Exam Constitutional:      General: She is not in acute distress.    Appearance: She is well-developed and normal weight.  HENT:     Head: Normocephalic and atraumatic.      Right Ear:  Tympanic membrane normal.     Left Ear: Tympanic membrane normal.  Eyes:     Conjunctiva/sclera: Conjunctivae normal.     Pupils: Pupils are equal, round, and reactive to light.  Cardiovascular:     Rate and Rhythm: Normal rate.  Pulmonary:     Effort: Pulmonary effort is normal. No respiratory distress.  Abdominal:     General: There is no distension.     Palpations: Abdomen is soft.  Musculoskeletal:        General: Normal range of motion.     Cervical back: Normal range of motion.  Skin:    General: Skin is warm and dry.  Neurological:     Mental Status: She is alert.      UC Treatments / Results  Labs (all labs ordered are listed, but only abnormal results are displayed) Labs Reviewed - No data to display  EKG   Radiology No results found.  Procedures Procedures (including critical care time)  Medications Ordered in UC Medications  cefTRIAXone  (ROCEPHIN ) injection 1,000 mg (1,000 mg Intramuscular Given 09/11/24 1043)    Initial Impression / Assessment and Plan / UC Course  I have reviewed the triage vital signs and the nursing notes.  Pertinent labs & imaging results that were available during my care of the patient were reviewed by me and considered in my medical decision making (see chart for details).     I expressed to the patient as result I have concern about this rapidly developing infection.  It is unusual to get this swollen and just 24 hours.  She is given a shot of Rocephin  and Augmentin antibiotic.  They are given instructions that at any sign she is worse instead of better she needs to go to the ER for IV antibiotics and treatment.  Also emphasized the need to follow-up with a dentist Final Clinical Impressions(s) / UC Diagnoses   Final diagnoses:  Dental infection  Cellulitis, face     Discharge Instructions      Take the Augmentin antibiotic 2 times a day.  Take with food Take Tylenol  for moderate pain.  Take hydrocodone if needed for  severe pain Hydrocodone can cause dizziness and drowsiness.  Do not drive on hydrocodone If you get worse instead of better at any time, you must go to the emergency room You need to follow-up with a dentist next week   ED Prescriptions     Medication Sig Dispense Auth. Provider   HYDROcodone-acetaminophen  (NORCO/VICODIN) 5-325 MG tablet Take 1-2 tablets by mouth every 6 (six) hours as needed. 10 tablet Maranda Jamee Jacob, MD   amoxicillin-clavulanate (AUGMENTIN) 875-125 MG tablet Take 1 tablet by mouth every 12 (twelve) hours.  14 tablet Maranda Jamee Jacob, MD      I have reviewed the PDMP during this encounter.    [1]  Social History Tobacco Use   Smoking status: Never   Smokeless tobacco: Never  Vaping Use   Vaping status: Never Used  Substance Use Topics   Alcohol  use: No   Drug use: No     Maranda Jamee Jacob, MD 09/11/24 1053

## 2024-09-11 NOTE — Discharge Instructions (Signed)
 Take the Augmentin antibiotic 2 times a day.  Take with food Take Tylenol  for moderate pain.  Take hydrocodone if needed for severe pain Hydrocodone can cause dizziness and drowsiness.  Do not drive on hydrocodone If you get worse instead of better at any time, you must go to the emergency room You need to follow-up with a dentist next week

## 2024-09-30 ENCOUNTER — Other Ambulatory Visit: Payer: Self-pay

## 2024-09-30 ENCOUNTER — Other Ambulatory Visit (HOSPITAL_COMMUNITY): Payer: Self-pay

## 2024-09-30 ENCOUNTER — Other Ambulatory Visit (HOSPITAL_BASED_OUTPATIENT_CLINIC_OR_DEPARTMENT_OTHER): Payer: Self-pay

## 2024-09-30 MED ORDER — WARFARIN SODIUM 4 MG PO TABS
ORAL_TABLET | ORAL | 3 refills | Status: AC
  Filled 2024-09-30: qty 90, 70d supply, fill #0

## 2024-09-30 MED ORDER — ATORVASTATIN CALCIUM 40 MG PO TABS
40.0000 mg | ORAL_TABLET | Freq: Every day | ORAL | 3 refills | Status: AC
  Filled 2024-09-30: qty 90, 90d supply, fill #0

## 2024-09-30 MED ORDER — PANTOPRAZOLE SODIUM 40 MG PO TBEC
40.0000 mg | DELAYED_RELEASE_TABLET | Freq: Every day | ORAL | 3 refills | Status: AC
  Filled 2024-09-30: qty 90, 90d supply, fill #0

## 2024-10-23 ENCOUNTER — Other Ambulatory Visit (HOSPITAL_COMMUNITY): Payer: Self-pay

## 2024-10-24 ENCOUNTER — Other Ambulatory Visit (HOSPITAL_COMMUNITY): Payer: Self-pay

## 2024-10-24 ENCOUNTER — Other Ambulatory Visit: Payer: Self-pay

## 2024-10-24 MED ORDER — FERROUS SULFATE 325 (65 FE) MG PO TABS
325.0000 mg | ORAL_TABLET | Freq: Every day | ORAL | 3 refills | Status: AC
  Filled 2024-10-24: qty 90, 90d supply, fill #0

## 2024-10-24 MED ORDER — PROPRANOLOL HCL ER 60 MG PO CP24: 60.0000 mg | ORAL_CAPSULE | Freq: Every day | ORAL | 3 refills | Status: AC

## 2024-10-24 MED ORDER — TRAZODONE HCL 50 MG PO TABS
50.0000 mg | ORAL_TABLET | Freq: Every day | ORAL | 3 refills | Status: AC
  Filled 2024-10-24: qty 90, 90d supply, fill #0

## 2024-10-25 ENCOUNTER — Other Ambulatory Visit (HOSPITAL_COMMUNITY): Payer: Self-pay

## 2024-10-25 MED ORDER — VENLAFAXINE HCL 37.5 MG PO TABS
37.5000 mg | ORAL_TABLET | Freq: Every day | ORAL | 3 refills | Status: AC
  Filled 2024-10-25: qty 90, 90d supply, fill #0

## 2024-10-26 ENCOUNTER — Other Ambulatory Visit (HOSPITAL_COMMUNITY): Payer: Self-pay

## 2024-12-09 ENCOUNTER — Ambulatory Visit (HOSPITAL_COMMUNITY)
